# Patient Record
Sex: Female | Born: 1940
Health system: Southern US, Community
[De-identification: ages and names within clinical notes are randomized; demographics above are authoritative.]

## PROBLEM LIST (undated history)

## (undated) DIAGNOSIS — J45909 Unspecified asthma, uncomplicated: Secondary | ICD-10-CM

## (undated) DIAGNOSIS — G459 Transient cerebral ischemic attack, unspecified: Secondary | ICD-10-CM

## (undated) DIAGNOSIS — I10 Essential (primary) hypertension: Secondary | ICD-10-CM

## (undated) DIAGNOSIS — E785 Hyperlipidemia, unspecified: Secondary | ICD-10-CM

## (undated) DIAGNOSIS — M1990 Unspecified osteoarthritis, unspecified site: Secondary | ICD-10-CM

## (undated) DIAGNOSIS — R232 Flushing: Secondary | ICD-10-CM

## (undated) DIAGNOSIS — I639 Cerebral infarction, unspecified: Secondary | ICD-10-CM

## (undated) DIAGNOSIS — I69354 Hemiplegia and hemiparesis following cerebral infarction affecting left non-dominant side: Secondary | ICD-10-CM

## (undated) DIAGNOSIS — F418 Other specified anxiety disorders: Secondary | ICD-10-CM

## (undated) DIAGNOSIS — M6289 Other specified disorders of muscle: Secondary | ICD-10-CM

## (undated) DIAGNOSIS — K219 Gastro-esophageal reflux disease without esophagitis: Secondary | ICD-10-CM

## (undated) DIAGNOSIS — M24542 Contracture, left hand: Secondary | ICD-10-CM

## (undated) HISTORY — DX: Other specified anxiety disorders: F41.8

## (undated) HISTORY — PX: APPENDECTOMY: SHX54

## (undated) HISTORY — DX: Other specified disorders of muscle: M62.89

## (undated) HISTORY — PX: VAGINAL HYSTERECTOMY: SHX2639

## (undated) HISTORY — DX: Flushing: R23.2

## (undated) HISTORY — PX: EYE SURGERY: SHX253

---

## 2000-07-15 ENCOUNTER — Encounter: Payer: Self-pay | Admitting: Family Medicine

## 2000-07-15 ENCOUNTER — Encounter: Admission: RE | Admit: 2000-07-15 | Discharge: 2000-07-15 | Payer: Self-pay | Admitting: Family Medicine

## 2000-07-22 ENCOUNTER — Encounter: Admission: RE | Admit: 2000-07-22 | Discharge: 2000-08-27 | Payer: Self-pay | Admitting: Family Medicine

## 2004-09-17 ENCOUNTER — Ambulatory Visit: Payer: Self-pay | Admitting: Family Medicine

## 2004-11-19 ENCOUNTER — Encounter: Payer: Self-pay | Admitting: Family Medicine

## 2004-11-19 ENCOUNTER — Other Ambulatory Visit: Admission: RE | Admit: 2004-11-19 | Discharge: 2004-11-19 | Payer: Self-pay | Admitting: Family Medicine

## 2004-11-19 ENCOUNTER — Ambulatory Visit: Payer: Self-pay | Admitting: Family Medicine

## 2004-11-26 ENCOUNTER — Ambulatory Visit: Payer: Self-pay | Admitting: Internal Medicine

## 2004-11-28 ENCOUNTER — Ambulatory Visit: Payer: Self-pay | Admitting: Family Medicine

## 2005-05-13 ENCOUNTER — Emergency Department: Payer: Self-pay | Admitting: Emergency Medicine

## 2006-01-28 ENCOUNTER — Ambulatory Visit: Payer: Self-pay | Admitting: Family Medicine

## 2006-02-03 ENCOUNTER — Ambulatory Visit: Payer: Self-pay | Admitting: Family Medicine

## 2006-02-03 LAB — CONVERTED CEMR LAB
ALT: 186 units/L — ABNORMAL HIGH (ref 0–40)
Basophils Absolute: 0 10*3/uL (ref 0.0–0.1)
Calcium: 9.7 mg/dL (ref 8.4–10.5)
Creatinine, Ser: 0.7 mg/dL (ref 0.4–1.2)
Direct LDL: 170.7 mg/dL
Eosinophils Absolute: 0.1 10*3/uL (ref 0.0–0.6)
GFR calc Af Amer: 108 mL/min
HDL: 32.6 mg/dL — ABNORMAL LOW (ref >39.0)
Hemoglobin: 14 g/dL (ref 12.0–15.0)
MCHC: 34.2 g/dL (ref 30.0–36.0)
Monocytes Absolute: 0.4 10*3/uL (ref 0.2–0.7)
Monocytes Relative: 7.6 % (ref 3.0–11.0)
Neutrophils Relative %: 62.6 % (ref 43.0–77.0)
RBC: 4.55 M/uL (ref 3.87–5.11)
RDW: 12 % (ref 11.5–14.6)
TSH: 2.61 microintl units/mL
TSH: 2.61 microintl units/mL (ref 0.35–5.50)
Total CHOL/HDL Ratio: 7.1
Triglycerides: 173 mg/dL — ABNORMAL HIGH (ref 0–149)

## 2006-02-06 ENCOUNTER — Ambulatory Visit: Payer: Self-pay | Admitting: Family Medicine

## 2006-02-28 ENCOUNTER — Ambulatory Visit: Payer: Self-pay | Admitting: Family Medicine

## 2006-04-18 ENCOUNTER — Ambulatory Visit: Payer: Self-pay | Admitting: Family Medicine

## 2006-04-18 LAB — CONVERTED CEMR LAB
ALT: 26 units/L (ref 0–40)
AST: 30 units/L (ref 0–37)
Albumin: 3.9 g/dL (ref 3.5–5.2)
Alkaline Phosphatase: 64 units/L (ref 39–117)
Bilirubin, Direct: 0.1 mg/dL (ref 0.0–0.3)
Total Bilirubin: 0.7 mg/dL (ref 0.3–1.2)

## 2006-07-23 ENCOUNTER — Encounter: Payer: Self-pay | Admitting: Family Medicine

## 2006-07-23 DIAGNOSIS — E039 Hypothyroidism, unspecified: Secondary | ICD-10-CM | POA: Insufficient documentation

## 2006-07-23 DIAGNOSIS — T7840XA Allergy, unspecified, initial encounter: Secondary | ICD-10-CM | POA: Insufficient documentation

## 2006-07-23 DIAGNOSIS — M8580 Other specified disorders of bone density and structure, unspecified site: Secondary | ICD-10-CM

## 2006-07-23 DIAGNOSIS — E785 Hyperlipidemia, unspecified: Secondary | ICD-10-CM | POA: Insufficient documentation

## 2006-07-23 DIAGNOSIS — I10 Essential (primary) hypertension: Secondary | ICD-10-CM | POA: Insufficient documentation

## 2006-07-23 DIAGNOSIS — L719 Rosacea, unspecified: Secondary | ICD-10-CM | POA: Insufficient documentation

## 2006-07-29 ENCOUNTER — Ambulatory Visit: Payer: Self-pay | Admitting: Family Medicine

## 2006-07-31 LAB — CONVERTED CEMR LAB
Alkaline Phosphatase: 61 units/L (ref 39–117)
Cholesterol: 192 mg/dL (ref 0–200)
Direct LDL: 134 mg/dL
Total Bilirubin: 0.5 mg/dL (ref 0.3–1.2)

## 2006-12-03 ENCOUNTER — Ambulatory Visit: Payer: Self-pay | Admitting: Family Medicine

## 2006-12-08 ENCOUNTER — Ambulatory Visit: Payer: Self-pay | Admitting: Family Medicine

## 2007-02-10 ENCOUNTER — Encounter (INDEPENDENT_AMBULATORY_CARE_PROVIDER_SITE_OTHER): Payer: Self-pay | Admitting: Internal Medicine

## 2007-02-18 ENCOUNTER — Encounter (INDEPENDENT_AMBULATORY_CARE_PROVIDER_SITE_OTHER): Payer: Self-pay | Admitting: *Deleted

## 2007-03-02 ENCOUNTER — Ambulatory Visit: Payer: Self-pay | Admitting: Family Medicine

## 2007-03-06 ENCOUNTER — Encounter (INDEPENDENT_AMBULATORY_CARE_PROVIDER_SITE_OTHER): Payer: Self-pay | Admitting: *Deleted

## 2007-03-06 LAB — CONVERTED CEMR LAB
ALT: 22 units/L (ref 0–35)
Basophils Absolute: 0 10*3/uL (ref 0.0–0.1)
CO2: 34 meq/L — ABNORMAL HIGH (ref 19–32)
Cholesterol: 201 mg/dL (ref 0–200)
Eosinophils Absolute: 0.3 10*3/uL (ref 0.0–0.6)
Glucose, Bld: 92 mg/dL (ref 70–99)
Lymphocytes Relative: 26.7 % (ref 12.0–46.0)
MCV: 88.4 fL (ref 78.0–100.0)
Monocytes Relative: 4.6 % (ref 3.0–11.0)
Neutro Abs: 5.3 10*3/uL (ref 1.4–7.7)
Neutrophils Relative %: 64.3 % (ref 43.0–77.0)
Potassium: 3.3 meq/L — ABNORMAL LOW (ref 3.5–5.1)
RBC: 4.25 M/uL (ref 3.87–5.11)
Sodium: 142 meq/L (ref 135–145)
TSH: 4.11 microintl units/mL (ref 0.35–5.50)
Total Bilirubin: 0.5 mg/dL (ref 0.3–1.2)
Total CHOL/HDL Ratio: 5.2
Total Protein: 6.8 g/dL (ref 6.0–8.3)
Triglycerides: 251 mg/dL (ref 0–149)
VLDL: 50 mg/dL — ABNORMAL HIGH (ref 0–40)
Vit D, 1,25-Dihydroxy: 45 (ref 30–89)
WBC: 8.2 10*3/uL (ref 4.5–10.5)

## 2007-04-07 ENCOUNTER — Ambulatory Visit: Payer: Self-pay | Admitting: Family Medicine

## 2007-04-13 ENCOUNTER — Ambulatory Visit: Payer: Self-pay | Admitting: Internal Medicine

## 2007-04-13 ENCOUNTER — Encounter: Payer: Self-pay | Admitting: Family Medicine

## 2007-04-16 LAB — CONVERTED CEMR LAB: Potassium: 3.6 meq/L (ref 3.5–5.1)

## 2007-05-05 ENCOUNTER — Telehealth: Payer: Self-pay | Admitting: Family Medicine

## 2007-05-11 ENCOUNTER — Encounter (INDEPENDENT_AMBULATORY_CARE_PROVIDER_SITE_OTHER): Payer: Self-pay | Admitting: *Deleted

## 2007-07-09 ENCOUNTER — Telehealth (INDEPENDENT_AMBULATORY_CARE_PROVIDER_SITE_OTHER): Payer: Self-pay | Admitting: *Deleted

## 2007-09-17 ENCOUNTER — Ambulatory Visit: Payer: Self-pay | Admitting: Family Medicine

## 2007-09-21 ENCOUNTER — Ambulatory Visit: Payer: Self-pay | Admitting: Family Medicine

## 2007-09-23 ENCOUNTER — Telehealth (INDEPENDENT_AMBULATORY_CARE_PROVIDER_SITE_OTHER): Payer: Self-pay | Admitting: *Deleted

## 2007-09-23 ENCOUNTER — Encounter: Payer: Self-pay | Admitting: Family Medicine

## 2007-09-28 ENCOUNTER — Telehealth: Payer: Self-pay | Admitting: Family Medicine

## 2007-09-29 ENCOUNTER — Encounter: Payer: Self-pay | Admitting: Family Medicine

## 2007-09-29 ENCOUNTER — Ambulatory Visit: Payer: Self-pay | Admitting: Family Medicine

## 2007-10-02 ENCOUNTER — Ambulatory Visit: Payer: Self-pay | Admitting: Family Medicine

## 2007-10-13 ENCOUNTER — Ambulatory Visit: Payer: Self-pay | Admitting: Family Medicine

## 2007-10-13 DIAGNOSIS — K219 Gastro-esophageal reflux disease without esophagitis: Secondary | ICD-10-CM

## 2007-10-22 ENCOUNTER — Ambulatory Visit: Payer: Self-pay | Admitting: Pulmonary Disease

## 2007-10-22 DIAGNOSIS — R930 Abnormal findings on diagnostic imaging of skull and head, not elsewhere classified: Secondary | ICD-10-CM

## 2008-01-05 ENCOUNTER — Encounter: Payer: Self-pay | Admitting: Family Medicine

## 2008-01-05 ENCOUNTER — Other Ambulatory Visit: Admission: RE | Admit: 2008-01-05 | Discharge: 2008-01-05 | Payer: Self-pay | Admitting: Family Medicine

## 2008-01-05 ENCOUNTER — Ambulatory Visit: Payer: Self-pay | Admitting: Family Medicine

## 2008-01-05 LAB — CONVERTED CEMR LAB: Whiff Test: NEGATIVE

## 2008-02-01 ENCOUNTER — Telehealth: Payer: Self-pay | Admitting: Family Medicine

## 2008-05-09 ENCOUNTER — Encounter: Payer: Self-pay | Admitting: Family Medicine

## 2008-05-17 ENCOUNTER — Encounter: Payer: Self-pay | Admitting: Family Medicine

## 2008-05-21 ENCOUNTER — Encounter (INDEPENDENT_AMBULATORY_CARE_PROVIDER_SITE_OTHER): Payer: Self-pay | Admitting: *Deleted

## 2008-06-17 ENCOUNTER — Encounter: Payer: Self-pay | Admitting: Family Medicine

## 2008-06-17 HISTORY — PX: COLONOSCOPY: SHX174

## 2008-11-08 ENCOUNTER — Ambulatory Visit: Payer: Self-pay | Admitting: Family Medicine

## 2008-11-22 ENCOUNTER — Ambulatory Visit: Payer: Self-pay | Admitting: Family Medicine

## 2008-12-14 ENCOUNTER — Ambulatory Visit: Payer: Self-pay | Admitting: Family Medicine

## 2008-12-14 ENCOUNTER — Other Ambulatory Visit: Admission: RE | Admit: 2008-12-14 | Discharge: 2008-12-14 | Payer: Self-pay | Admitting: Family Medicine

## 2008-12-15 LAB — CONVERTED CEMR LAB
Alkaline Phosphatase: 67 units/L (ref 39–117)
Basophils Absolute: 0.1 10*3/uL (ref 0.0–0.1)
Bilirubin, Direct: 0 mg/dL (ref 0.0–0.3)
CO2: 32 meq/L (ref 19–32)
Chloride: 103 meq/L (ref 96–112)
Cholesterol: 230 mg/dL — ABNORMAL HIGH (ref 0–200)
Direct LDL: 161.4 mg/dL
Eosinophils Absolute: 0.2 10*3/uL (ref 0.0–0.7)
Eosinophils Relative: 2.5 % (ref 0.0–5.0)
HCT: 39.1 % (ref 36.0–46.0)
HDL: 42.7 mg/dL (ref >39.00)
Lymphs Abs: 2.8 10*3/uL (ref 0.7–4.0)
MCHC: 33.3 g/dL (ref 30.0–36.0)
MCV: 89 fL (ref 78.0–100.0)
Monocytes Relative: 7.5 % (ref 3.0–12.0)
Neutrophils Relative %: 54.6 % (ref 43.0–77.0)
Platelets: 261 10*3/uL (ref 150.0–400.0)
Total Bilirubin: 0.6 mg/dL (ref 0.3–1.2)
Total CHOL/HDL Ratio: 5
Total Protein: 7 g/dL (ref 6.0–8.3)
Triglycerides: 173 mg/dL — ABNORMAL HIGH (ref 0.0–149.0)
VLDL: 34.6 mg/dL (ref 0.0–40.0)
Vitamin B-12: 995 pg/mL — ABNORMAL HIGH (ref 211–911)

## 2008-12-23 ENCOUNTER — Encounter (INDEPENDENT_AMBULATORY_CARE_PROVIDER_SITE_OTHER): Payer: Self-pay | Admitting: *Deleted

## 2008-12-23 LAB — CONVERTED CEMR LAB: Pap Smear: NORMAL

## 2008-12-28 ENCOUNTER — Inpatient Hospital Stay (HOSPITAL_COMMUNITY): Admission: EM | Admit: 2008-12-28 | Discharge: 2009-01-02 | Payer: Self-pay | Admitting: Emergency Medicine

## 2008-12-29 ENCOUNTER — Encounter (INDEPENDENT_AMBULATORY_CARE_PROVIDER_SITE_OTHER): Payer: Self-pay | Admitting: Neurology

## 2008-12-29 ENCOUNTER — Ambulatory Visit: Payer: Self-pay | Admitting: Vascular Surgery

## 2008-12-29 ENCOUNTER — Ambulatory Visit: Payer: Self-pay | Admitting: Physical Medicine & Rehabilitation

## 2009-01-02 ENCOUNTER — Inpatient Hospital Stay (HOSPITAL_COMMUNITY)
Admission: EM | Admit: 2009-01-02 | Discharge: 2009-01-23 | Payer: Self-pay | Admitting: Physical Medicine & Rehabilitation

## 2009-01-07 ENCOUNTER — Ambulatory Visit: Payer: Self-pay | Admitting: Physical Medicine & Rehabilitation

## 2009-01-09 ENCOUNTER — Ambulatory Visit: Payer: Self-pay | Admitting: Physical Medicine & Rehabilitation

## 2009-02-15 ENCOUNTER — Encounter
Admission: RE | Admit: 2009-02-15 | Discharge: 2009-05-16 | Payer: Self-pay | Admitting: Physical Medicine & Rehabilitation

## 2009-02-21 ENCOUNTER — Ambulatory Visit: Payer: Self-pay | Admitting: Physical Medicine & Rehabilitation

## 2009-03-21 ENCOUNTER — Ambulatory Visit: Payer: Self-pay | Admitting: Physical Medicine & Rehabilitation

## 2009-04-04 ENCOUNTER — Ambulatory Visit: Payer: Self-pay | Admitting: Family Medicine

## 2009-04-04 DIAGNOSIS — G8190 Hemiplegia, unspecified affecting unspecified side: Secondary | ICD-10-CM

## 2009-04-04 DIAGNOSIS — R7309 Other abnormal glucose: Secondary | ICD-10-CM

## 2009-04-04 DIAGNOSIS — I6350 Cerebral infarction due to unspecified occlusion or stenosis of unspecified cerebral artery: Secondary | ICD-10-CM | POA: Insufficient documentation

## 2009-04-05 ENCOUNTER — Encounter: Payer: Self-pay | Admitting: Family Medicine

## 2009-04-05 LAB — CONVERTED CEMR LAB
Albumin: 4.2 g/dL (ref 3.5–5.2)
BUN: 11 mg/dL (ref 6–23)
Calcium: 10.1 mg/dL (ref 8.4–10.5)
GFR calc non Af Amer: 88.09 mL/min (ref >60)
HDL: 46 mg/dL (ref >39.00)
Hgb A1c MFr Bld: 5.4 % (ref 4.6–6.5)
Phosphorus: 4.2 mg/dL (ref 2.3–4.6)
Potassium: 4.9 meq/L (ref 3.5–5.1)
Sodium: 142 meq/L (ref 135–145)
Total CHOL/HDL Ratio: 3

## 2009-04-10 ENCOUNTER — Telehealth: Payer: Self-pay | Admitting: Family Medicine

## 2009-04-10 DIAGNOSIS — M25519 Pain in unspecified shoulder: Secondary | ICD-10-CM | POA: Insufficient documentation

## 2009-04-13 ENCOUNTER — Ambulatory Visit: Payer: Self-pay | Admitting: Internal Medicine

## 2009-04-13 DIAGNOSIS — M1990 Unspecified osteoarthritis, unspecified site: Secondary | ICD-10-CM | POA: Insufficient documentation

## 2009-04-14 ENCOUNTER — Telehealth: Payer: Self-pay | Admitting: Family Medicine

## 2009-04-14 ENCOUNTER — Telehealth: Payer: Self-pay | Admitting: Internal Medicine

## 2009-04-18 ENCOUNTER — Telehealth: Payer: Self-pay | Admitting: Internal Medicine

## 2009-04-20 ENCOUNTER — Encounter: Payer: Self-pay | Admitting: Internal Medicine

## 2009-04-20 ENCOUNTER — Telehealth: Payer: Self-pay | Admitting: Internal Medicine

## 2009-04-20 ENCOUNTER — Ambulatory Visit: Payer: Self-pay | Admitting: Internal Medicine

## 2009-04-20 DIAGNOSIS — M7580 Other shoulder lesions, unspecified shoulder: Secondary | ICD-10-CM

## 2009-04-25 ENCOUNTER — Telehealth: Payer: Self-pay | Admitting: Internal Medicine

## 2009-04-27 ENCOUNTER — Telehealth: Payer: Self-pay | Admitting: Internal Medicine

## 2009-04-27 ENCOUNTER — Ambulatory Visit: Payer: Self-pay | Admitting: Physical Medicine & Rehabilitation

## 2009-05-02 ENCOUNTER — Inpatient Hospital Stay (HOSPITAL_COMMUNITY): Admission: EM | Admit: 2009-05-02 | Discharge: 2009-05-09 | Payer: Self-pay | Admitting: Emergency Medicine

## 2009-05-02 ENCOUNTER — Ambulatory Visit: Payer: Self-pay | Admitting: Cardiovascular Disease

## 2009-05-02 ENCOUNTER — Encounter: Payer: Self-pay | Admitting: Internal Medicine

## 2009-05-02 ENCOUNTER — Telehealth: Payer: Self-pay | Admitting: Internal Medicine

## 2009-05-03 ENCOUNTER — Encounter: Payer: Self-pay | Admitting: Internal Medicine

## 2009-05-05 ENCOUNTER — Ambulatory Visit: Payer: Self-pay | Admitting: Physical Medicine & Rehabilitation

## 2009-05-07 ENCOUNTER — Encounter (INDEPENDENT_AMBULATORY_CARE_PROVIDER_SITE_OTHER): Payer: Self-pay | Admitting: Internal Medicine

## 2009-05-12 ENCOUNTER — Encounter: Payer: Self-pay | Admitting: Internal Medicine

## 2009-06-20 ENCOUNTER — Encounter: Payer: Self-pay | Admitting: Internal Medicine

## 2009-06-21 ENCOUNTER — Encounter: Payer: Self-pay | Admitting: Internal Medicine

## 2009-06-23 ENCOUNTER — Ambulatory Visit: Payer: Self-pay | Admitting: Internal Medicine

## 2009-06-23 DIAGNOSIS — E876 Hypokalemia: Secondary | ICD-10-CM | POA: Insufficient documentation

## 2009-06-23 LAB — CONVERTED CEMR LAB
BUN: 8 mg/dL (ref 6–23)
Creatinine, Ser: 0.6 mg/dL (ref 0.4–1.2)
GFR calc non Af Amer: 116.28 mL/min (ref >60)
Glucose, Bld: 92 mg/dL (ref 70–99)
Hgb A1c MFr Bld: 5.6 % (ref 4.6–6.5)
Magnesium: 1.9 mg/dL (ref 1.5–2.5)
Potassium: 3.6 meq/L (ref 3.5–5.1)
Sodium: 137 meq/L (ref 135–145)

## 2009-06-26 ENCOUNTER — Encounter: Payer: Self-pay | Admitting: Internal Medicine

## 2009-06-26 ENCOUNTER — Telehealth: Payer: Self-pay | Admitting: Internal Medicine

## 2009-06-27 ENCOUNTER — Encounter: Payer: Self-pay | Admitting: Internal Medicine

## 2009-07-01 ENCOUNTER — Emergency Department: Payer: Self-pay | Admitting: Internal Medicine

## 2009-07-05 ENCOUNTER — Encounter: Payer: Self-pay | Admitting: Internal Medicine

## 2009-07-07 ENCOUNTER — Encounter: Payer: Self-pay | Admitting: Internal Medicine

## 2009-07-11 ENCOUNTER — Encounter: Payer: Self-pay | Admitting: Internal Medicine

## 2009-07-23 ENCOUNTER — Encounter: Payer: Self-pay | Admitting: Internal Medicine

## 2009-07-28 ENCOUNTER — Encounter: Payer: Self-pay | Admitting: Internal Medicine

## 2009-07-31 ENCOUNTER — Telehealth: Payer: Self-pay | Admitting: Internal Medicine

## 2009-07-31 ENCOUNTER — Encounter: Payer: Self-pay | Admitting: Internal Medicine

## 2009-08-01 ENCOUNTER — Telehealth: Payer: Self-pay | Admitting: Internal Medicine

## 2009-08-01 DIAGNOSIS — J370 Chronic laryngitis: Secondary | ICD-10-CM

## 2009-08-02 ENCOUNTER — Encounter: Payer: Self-pay | Admitting: Internal Medicine

## 2009-08-05 ENCOUNTER — Encounter: Payer: Self-pay | Admitting: Internal Medicine

## 2009-08-07 ENCOUNTER — Encounter: Payer: Self-pay | Admitting: Internal Medicine

## 2009-08-08 ENCOUNTER — Encounter: Payer: Self-pay | Admitting: Internal Medicine

## 2009-08-10 ENCOUNTER — Telehealth: Payer: Self-pay | Admitting: Internal Medicine

## 2009-08-22 ENCOUNTER — Encounter: Payer: Self-pay | Admitting: Internal Medicine

## 2009-08-23 ENCOUNTER — Telehealth (INDEPENDENT_AMBULATORY_CARE_PROVIDER_SITE_OTHER): Payer: Self-pay | Admitting: *Deleted

## 2009-09-07 ENCOUNTER — Encounter: Payer: Self-pay | Admitting: Internal Medicine

## 2009-10-07 ENCOUNTER — Encounter: Payer: Self-pay | Admitting: Internal Medicine

## 2009-11-07 ENCOUNTER — Encounter: Payer: Self-pay | Admitting: Internal Medicine

## 2009-12-07 ENCOUNTER — Encounter: Payer: Self-pay | Admitting: Internal Medicine

## 2010-01-04 ENCOUNTER — Encounter: Payer: Self-pay | Admitting: Internal Medicine

## 2010-01-07 ENCOUNTER — Encounter: Payer: Self-pay | Admitting: Internal Medicine

## 2010-01-28 ENCOUNTER — Encounter: Payer: Self-pay | Admitting: Internal Medicine

## 2010-01-30 ENCOUNTER — Telehealth: Payer: Self-pay | Admitting: Family Medicine

## 2010-02-06 NOTE — Progress Notes (Signed)
Summary: CONTINUED PAIN   Phone Note Call from Patient   Summary of Call: Patient is requesting results from yesterday.  Initial call taken by: Lamar Sprinkles, CMA,  April 14, 2009 4:37 PM  Follow-up for Phone Call        xrays were normal Follow-up by: Etta Grandchild MD,  April 16, 2009 12:28 PM  Additional Follow-up for Phone Call Additional follow up Details #1::        Pt informed, she continues to be extremely sore especially in her chest, mainly at night. Sleeping sitting up in recliner is the only way she is able to get any relief. She is taking tramadol qid as prescribed.    *Pt is transferring to Dr Yetta Barre as PCP Additional Follow-up by: Lamar Sprinkles, CMA,  April 17, 2009 11:22 AM    Additional Follow-up for Phone Call Additional follow up Details #2::    is she taking 2 tramadol? Follow-up by: Etta Grandchild MD,  April 17, 2009 11:27 AM  Additional Follow-up for Phone Call Additional follow up Details #3:: Details for Additional Follow-up Action Taken: She is taking 1 four times a day.....................Marland KitchenLamar Sprinkles, CMA  April 17, 2009 11:52 AM   please increase to 2 qid   Pt informed.........................Marland KitchenLamar Sprinkles, CMA  April 17, 2009 2:03 PM  Additional Follow-up by: Etta Grandchild MD,  April 17, 2009 12:01 PM

## 2010-02-06 NOTE — Progress Notes (Signed)
  Phone Note Call from Patient   Caller: Patient Summary of Call: Called Patients home, spoke to her husband to give them the Orthopedic appt we had made. Husband told me to cancel this appt that they  did not want it anymore and that they would be seeing Dr Santa Genera from now on they would not be coming back to [facility]. Initial call taken by: Carlton Adam,  April 14, 2009 10:32 AM

## 2010-02-06 NOTE — Progress Notes (Signed)
Summary: REFILLS - New patient  Phone Note Refill Request   Refills Requested: Medication #1:  ZOCOR 40 MG TABS take one at bedtime  Medication #2:  ZANAFLEX 4 MG TABS take one four times a day  Medication #3:  HYDROCHLOROTHIAZIDE 25 MG TABS Take 1 tablet by mouth once a day  Medication #4:  ALTACE 5 MG CAPS Take 1 capsule by mouth once a day ALSO NEEDS XANAX  Initial call taken by: Lamar Sprinkles, CMA,  April 18, 2009 1:12 PM    Prescriptions: Prudy Feeler 0.5 MG TABS (ALPRAZOLAM) take one by mouth at bedtime as needed anxiety  #30 x 5   Entered and Authorized by:   Etta Grandchild MD   Signed by:   Etta Grandchild MD on 04/19/2009   Method used:   Print then Give to Patient   RxID:   [id number] ZOCOR 40 MG TABS (SIMVASTATIN) take one at bedtime  #30 x 11   Entered and Authorized by:   Etta Grandchild MD   Signed by:   Etta Grandchild MD on 04/19/2009   Method used:   Print then Give to Patient   RxID:   [id number] ALTACE 5 MG CAPS (RAMIPRIL) Take 1 capsule by mouth once a day  #30 x 11   Entered and Authorized by:   Etta Grandchild MD   Signed by:   Etta Grandchild MD on 04/19/2009   Method used:   Print then Give to Patient   RxID:   [id number] HYDROCHLOROTHIAZIDE 25 MG TABS (HYDROCHLOROTHIAZIDE) Take 1 tablet by mouth once a day  #30 x 11   Entered and Authorized by:   Etta Grandchild MD   Signed by:   Etta Grandchild MD on 04/19/2009   Method used:   Print then Give to Patient   RxID:   [id number] ZANAFLEX 4 MG TABS (TIZANIDINE HCL) take one four times a day  #120 x 11   Entered and Authorized by:   Etta Grandchild MD   Signed by:   Etta Grandchild MD on 04/19/2009   Method used:   Print then Give to Patient   RxID:   [id number]  Rx faxed to [facility]/ [address] at [phone]/la

## 2010-02-06 NOTE — Medication Information (Signed)
Summary: PAP/[facility]  PAP/[facility]   Imported By: Lester [place] 08/07/2009 07:59:01  _____________________________________________________________________  External Attachment:    Type:   Image     Comment:   External Document

## 2010-02-06 NOTE — Progress Notes (Signed)
Summary: ZOCOR  Phone Note Call from Patient   Summary of Call: Patient is requesting permission from Dr Yetta Barre to stop zocor. Per pt, Dr Milinda Antis told her that cholesterol was excellent.  Initial call taken by: Lamar Sprinkles, CMA,  April 27, 2009 3:42 PM  Follow-up for Phone Call        ok Follow-up by: Etta Grandchild MD,  April 27, 2009 3:44 PM  Additional Follow-up for Phone Call Additional follow up Details #1::        pt informed via home VM, told to call back with any further question or concerns Additional Follow-up by: Margaret Pyle, CMA,  April 27, 2009 4:38 PM

## 2010-02-06 NOTE — Letter (Signed)
Summary: Results Follow-up Letter  [facility]  [address] [place], [place] [address]   Phone: [phone]  Fax: [phone]    04/20/2009  [address] [place], [place]  [address]  Dear Ms. [name],   The following are the results of your recent test(s):  Test     Result     Shoulder     bones look good but there could be some impingement so        I would like to do an MRI   _________________________________________________________  Please call for an appointment as drirected_________________________________________________________ _________________________________________________________ _________________________________________________________  Sincerely,  Sanda Linger MD [facility]

## 2010-02-06 NOTE — Progress Notes (Signed)
Summary: REFILL  Phone Note Refill Request Message from:  Pharmacy  Refills Requested: Medication #1:  NEURONTIN 300 MG CAPS take one three times a day   Supply Requested: 1 year Initial call taken by: Lamar Sprinkles, CMA,  April 27, 2009 12:22 PM  Follow-up for Phone Call        ok Follow-up by: Etta Grandchild MD,  April 27, 2009 12:33 PM    Prescriptions: NEURONTIN 300 MG CAPS (GABAPENTIN) take one three times a day  #90 x 11   Entered and Authorized by:   Etta Grandchild MD   Signed by:   Etta Grandchild MD on 04/27/2009   Method used:   Electronically to        [facility] [phone]* (retail)       [address] [place], [place]  [address]       Ph: [phone]       Fax: [phone]   RxID:   [id number]

## 2010-02-06 NOTE — Consult Note (Signed)
Summary: [facility]  [facility]   Imported By: Sherian Rein 08/15/2009 13:32:26  _____________________________________________________________________  External Attachment:    Type:   Image     Comment:   External Document

## 2010-02-06 NOTE — Miscellaneous (Signed)
Summary: Plan of Treatment/[facility]  Plan of Treatment/[facility]   Imported By: Sherian Rein 07/18/2009 14:47:51  _____________________________________________________________________  External Attachment:    Type:   Image     Comment:   External Document

## 2010-02-06 NOTE — Miscellaneous (Signed)
Summary: Order/[facility]  Order/[facility]   Imported By: Lester [place] 05/08/2009 09:37:09  _____________________________________________________________________  External Attachment:    Type:   Image     Comment:   External Document

## 2010-02-06 NOTE — Miscellaneous (Signed)
Summary: Order/[facility]  Order/[facility]   Imported By: Lester [place] 06/22/2009 09:44:46  _____________________________________________________________________  External Attachment:    Type:   Image     Comment:   External Document

## 2010-02-06 NOTE — Letter (Signed)
Summary: Handicapped Placard/[facility]  Handicapped Plac[facility]   Imported By: Sherian Rein 05/05/2009 09:03:35  _____________________________________________________________________  External Attachment:    Type:   Image     Comment:   External Document

## 2010-02-06 NOTE — Progress Notes (Signed)
  Phone Note Other Incoming   Request: Send information Summary of Call: Request for records received from [facility]. Request forwarded to [facility].     

## 2010-02-06 NOTE — Progress Notes (Signed)
Summary: MRI  Phone Note Call from Patient   Summary of Call: Pt was unable to have the MRI yesterday b/c of severe pain and she was unable to lay still long enough. Radiology suggested pt req to have apt set up at the hospital for MRI w/sedation. If ok, please put in new referral.  Initial call taken by: Lamar Sprinkles, CMA,  April 25, 2009 8:50 AM  Follow-up for Phone Call        will refer to ortho instead, thanks Follow-up by: Etta Grandchild MD,  April 25, 2009 9:00 AM  Additional Follow-up for Phone Call Additional follow up Details #1::        Pt informed  Additional Follow-up by: Lamar Sprinkles, CMA,  April 25, 2009 10:47 AM

## 2010-02-06 NOTE — Miscellaneous (Signed)
Summary: [facility]  [facility]   Imported By: Lanelle Bal 04/11/2009 11:38:08  _____________________________________________________________________  External Attachment:    Type:   Image     Comment:   External Document

## 2010-02-06 NOTE — Miscellaneous (Signed)
Summary: [facility]  [facility]   Imported By: Lester [place] 08/24/2009 10:02:14  _____________________________________________________________________  External Attachment:    Type:   Image     Comment:   External Document

## 2010-02-06 NOTE — Miscellaneous (Signed)
Summary: Order/[facility]  Order/[facility]   Imported By: Lester [place] 05/08/2009 09:32:10  _____________________________________________________________________  External Attachment:    Type:   Image     Comment:   External Document

## 2010-02-06 NOTE — Assessment & Plan Note (Signed)
Summary: F/U [facility]  D/C 01/23/09,[facility]   Vital Signs:  Patient profile:   70 year old female Height:      60.75 inches Weight:      172.50 pounds BMI:     32.98 Temp:     98.8 degrees F oral Pulse rate:   76 / minute Pulse rhythm:   regular BP sitting:   110 / 64  (left arm) Cuff size:   regular  Vitals Entered By: Lewanda Rife LPN (April 04, 2009 12:08 PM) CC: f/u [facility] 01/23/09 and [facility]   History of Present Illness: here for f/u of hosp and long rehab stay after stroke   pt had lacunar cva resulting in L spastic hemiparesis  some question of R vert art stenosis and R ICa narrowing (wihtout stenosis)- carotid dopplers were clear  on asa was put on "point study" drug did well in rehab-- uses AFO brace on L ankle and also a cane  can walk on her own - for a while now   she completed the point study drug off asa  on plavix now -doing fine with  will be going into another study inv Dm-- Dr Pearlean Brownie   left arm is still in contracture -- they tried some botox injections and this helped - but lost some muscle fxn  can reach and pick up off the floor doing passive rom exercises   originally high sugar with AIC 6.2- low carb diet lost 10 lb  eating less salt now   on zanaflex for spasticity  is at home now - has PT and OT home - and need to sign some forms today  is using [facility]   i  Allergies: 1)  Tetracycline 2)  Ace Inhibitors  Past History:  Past Surgical History: Last updated: 01/03/2009 Appendectomy GYN surgery- D & C Hysterectomy- partial, abn. pap (1971) Sinus surgery- polyp removed Tonsillectomy Implants in jaw/ bone grafts Foot surgery Dexa- osteopenia (02/2001) Dexa- stable/ mixed trend (11/2004) Colonoscopy- polyp (01/2005) Fallopian tube removed after ectopic pregnancy colonoscopy adenom colon polyp -- re check ? 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 rehab   Review of Systems General:  Denies fatigue, fever, loss of appetite, and malaise. Eyes:  Denies blurring and eye pain. CV:  Denies chest pain or discomfort, lightheadness, palpitations, and shortness of breath with exertion. Resp:  Denies cough and wheezing. GI:  Denies abdominal pain, bloody stools, change in bowel habits, and indigestion. GU:  Denies dysuria. MS:  Complains of joint pain, cramps, muscle weakness, and stiffness. Derm:  Denies lesion(s), poor wound healing, and rash. Neuro:  Denies numbness and tingling. Psych:  Denies anxiety and depression; has kept a great attitude through all of this . Endo:  Denies cold intolerance, excessive thirst,  excessive urination, and heat intolerance. Heme:  Denies abnormal bruising and bleeding.   Impression & Recommendations:  Problem # 1:  LACUNAR INFARCTION (ICD-434.91) Assessment New  rev hosp records/ labs in detail today now on plavix  in line for another study f/u Dr Pearlean Brownie lab today f/u with me in 2 mo  Her updated medication list for this problem includes:    Plavix 75 Mg Tabs (Clopidogrel bisulfate) .Marland Kitchen... 1 by mouth once daily  Orders: Prescription Created Electronically [phone])  Problem # 2:  HEMIPARESIS, LEFT  (ICD-342.90) Assessment: New  much imp with PT and OT and (AFO/ cane) , botox inj will continue tx at home goal to return to work face/ speech- is back almost to nl  Orders: Prescription Created Electronically [phone])  Problem # 3:  HYPERGLYCEMIA (ICD-790.29) Assessment: New better diet and wt loss AIC today will be inolved in study regarding this soon disc healthy diet (low simple sugar/ choose complex carbs/ low sat fat) diet and exercise in detail  Orders: TLB-A1C / Hgb A1C (Glycohemoglobin) (83036-A1C) Prescription Created Electronically [phone])  Problem # 4:  HYPOTHYROIDISM (ICD-244.9) Assessment: Unchanged  clinically stable/lab today Orders: Venipuncture (48546) TLB-Lipid Panel (80061-LIPID) TLB-Renal Function Panel (80069-RENAL) TLB-TSH (Thyroid Stimulating Hormone) (27035-KKX) Prescription Created Electronically [phone])  Labs Reviewed: TSH: 4.50 (12/14/2008)    Chol: 230 (12/14/2008)   HDL: 42.70 (12/14/2008)   LDL: DEL (03/02/2007)   TG: 173.0 (12/14/2008)  Problem # 5:  HYPERTENSION (ICD-401.9) Assessment: Improved  very good control - continue to monitor lab today The following medications were removed from the medication list:    Cardizem Cd 180 Mg Cp24 (Diltiazem hcl coated beads) .Marland Kitchen... Take one by mouth daily    Hydrochlorothiazide 50 Mg Tabs (Hydrochlorothiazide) .Marland Kitchen... 1 by mouth once daily Her updated medication list for this problem includes:    Clonidine Hcl 0.1 Mg Tabs (Clonidine hcl) .Marland Kitchen... Take one by mouth daily    Cardizem Cd 240 Mg Xr24h-cap (Diltiazem hcl coated beads) .Marland Kitchen... Take one by mouth daily    Hydrochlorothiazide 25 Mg Tabs (Hydrochlorothiazide) .Marland Kitchen... Take 1 tablet by mouth once a day    Altace 5 Mg Caps (Ramipril) .Marland Kitchen... Take 1 capsule by mouth once a day    Norvasc 5 Mg Tabs (Amlodipine besylate) .Marland Kitchen... Take one daily at 10:00pm  Orders: Venipuncture (99371) TLB-Lipid Panel (80061-LIPID) TLB-Renal Function Panel  (80069-RENAL) Prescription Created Electronically [phone])  BP today: 110/64 Prior BP: 162/92 (12/14/2008)  Labs Reviewed: K+: 4.4 (12/14/2008) Creat: : 0.7 (12/14/2008)   Chol: 230 (12/14/2008)   HDL: 42.70 (12/14/2008)   LDL: DEL (03/02/2007)   TG: 173.0 (12/14/2008)  Problem # 6:  HYPERLIPIDEMIA (ICD-272.4) Assessment: Improved  continue statin and good diet lab today Her updated medication list for this problem includes:    Zocor 40 Mg Tabs (Simvastatin) .Marland Kitchen... Take one at bedtime  Orders: Venipuncture (93810) TLB-Lipid Panel (80061-LIPID) TLB-Renal Function Panel (80069-RENAL) TLB-ALT (SGPT) (84460-ALT) TLB-AST (SGOT) (84450-SGOT) Prescription Created Electronically [phone])  Labs Reviewed: SGOT: 31 (12/14/2008)   SGPT: 36 (12/14/2008)   HDL:42.70 (12/14/2008), 38.8 (03/02/2007)  LDL:DEL (03/02/2007), DEL (07/29/2006)  Chol:230 (12/14/2008), 201 (03/02/2007)  Trig:173.0 (12/14/2008), 251 (03/02/2007)  Complete Medication List: 1)  Clonidine Hcl 0.1 Mg Tabs (Clonidine hcl) .... Take one by mouth daily 2)  Evista 60 Mg Tabs (Raloxifene hcl) .... Take one by mouth daily 3)  Centrum Tabs (Multiple vitamins-minerals) .... Take one by mouth daily 4)  Vitamin D 1000 Unit Tabs (Cholecalciferol) .... Take one by mouth daily 5)  Sublingual B12 (over The Counter)  .Marland Kitchen.. 1 by mouth qd 6)  Probiotic- For Colon Over The Counter  7)  Potassium Chloride Cr 10 Meq Tbcr (Potassium chloride) .... Take one by mouth daily 8)  Astelin 137 Mcg/spray Soln (Azelastine hcl) .... 2 sprays per nostril once daily as needed 9)  Fish Oil 500 Mg Caps (Omega-3 fatty acids) .... Take two by mouth daily 10)  Cardizem Cd 240 Mg Xr24h-cap (Diltiazem hcl coated beads) .... Take one by mouth daily 11)  Calcium 500 Mg Tabs (Calcium) .... Take two by mouth twice a day 12)  Zanaflex 4 Mg Tabs (Tizanidine hcl) .... Take one four times a day 13)  Claritin 10 Mg Tabs (Loratadine) .... Otc as directed. 14)  Protonix  40 Mg Tbec (Pantoprazole sodium) .... Take 1 tablet by mouth once a day 15)  Hydrochlorothiazide 25 Mg Tabs (Hydrochlorothiazide) .... Take 1 tablet by mouth once a day 16)  Altace 5 Mg Caps (Ramipril) .... Take 1 capsule by mouth once a day 17)  Zocor 40 Mg Tabs (Simvastatin) .... Take one at bedtime 18)  Neurontin 300 Mg Caps (Gabapentin) .... Take one three times a day 19)  Tylenol Extra Strength 500 Mg Tabs (Acetaminophen) .... Otc as directed. 20)  Xanax 0.5 Mg Tabs (Alprazolam) .... Take one by mouth at bedtime as needed anxiety 21)  Celexa 10 Mg Tabs (Citalopram hydrobromide) .... Take one tablet in am 22)  Norvasc 5 Mg Tabs (Amlodipine besylate) .... Take one daily at 10:00pm 23)  Plavix 75 Mg Tabs (Clopidogrel bisulfate) .Marland Kitchen.. 1 by mouth once daily  Patient Instructions: 1)  keep up great effort with diet and rehabilitation  2)  drink enough water  3)  labs today  4)  follow up with me in about 2 months  Prescriptions: CELEXA 10 MG TABS (CITALOPRAM HYDROBROMIDE) take one tablet in AM  #30 x 11   Entered and Authorized by:   Judith Part MD   Signed by:   Judith Part MD on 04/04/2009   Method used:   Electronically to        [facility] [phone]* (retail)       [address] [place], [place]  [address]       Ph: [phone]       Fax: [phone]   RxID:   [id number]   Current Allergies (reviewed today): TETRACYCLINE ACE INHIBITORS

## 2010-02-06 NOTE — Miscellaneous (Signed)
Summary: Orders/[facility]  Orders/[facility]   Imported By: Sherian Rein 06/28/2009 15:06:59  _____________________________________________________________________  External Attachment:    Type:   Image     Comment:   External Document

## 2010-02-06 NOTE — Miscellaneous (Signed)
Summary: Treatment Plan/[facility]  Treatment Plan/[facility]   Imported By: Sherian Rein 07/31/2009 14:48:05  _____________________________________________________________________  External Attachment:    Type:   Image     Comment:   External Document

## 2010-02-06 NOTE — Progress Notes (Signed)
Summary: REFERRAL E.N.T  Phone Note Call from Patient Call back at Home Phone [phone]   Caller: Patient Reason for Call: Talk to Doctor Summary of Call: REQUEST REFERRAL TO [facility] Initial call taken by: Migdalia Dk,  August 01, 2009 2:52 PM  Follow-up for Phone Call        done Follow-up by: Etta Grandchild MD,  August 02, 2009 7:18 AM  Additional Follow-up for Phone Call Additional follow up Details #1::        pcc to notify.Marland KitchenMarland KitchenAlvy Beal Archie CMA  August 02, 2009 11:20 AM   New Problems: LARYNGITIS, CHRONIC (ICD-476.0)   New Problems: LARYNGITIS, CHRONIC (ICD-476.0)

## 2010-02-06 NOTE — Progress Notes (Signed)
Summary: TO ER  Phone Note Other Incoming   Caller: pt Summary of Call: Pt called and stated since yesterday she had been having arthritic pain in her fingers. She also states that she is having trouble with speech and states that this started last night. what do you advise? Do you want pt to go to the ER? Initial call taken by: Ami Bullins CMA,  May 02, 2009 8:26 AM  Follow-up for Phone Call        yes, the speech issue sounds like another stroke Follow-up by: Etta Grandchild MD,  May 02, 2009 8:27 AM  Additional Follow-up for Phone Call Additional follow up Details #1::        Spoke w/pt, she has slurred speech and c/o elevated bp 182/92. Advised ER now, pt agreed. Additional Follow-up by: Lamar Sprinkles, CMA,  May 02, 2009 8:36 AM

## 2010-02-06 NOTE — Progress Notes (Signed)
Summary: alternative  Phone Note From Pharmacy   Caller: [facility]  [place]. [phone]* Summary of Call: Received fax from pharmacist stating pt no able to pay $80copay for aggrenox. Pharmacy is requesting a substitute if possible. Please advise Thanks.Alvy Beal Archie CMA  July 31, 2009 3:41 PM   Follow-up for Phone Call        there is no substitute Follow-up by: Etta Grandchild MD,  July 31, 2009 3:50 PM  Additional Follow-up for Phone Call Additional follow up Details #1::        If patient can not pay for med, is it ok to stop?  Additional Follow-up by: Lamar Sprinkles, CMA,  August 01, 2009 2:03 PM    Additional Follow-up for Phone Call Additional follow up Details #2::    no Follow-up by: Etta Grandchild MD,  August 02, 2009 7:19 AM  Additional Follow-up for Phone Call Additional follow up Details #3:: Details for Additional Follow-up Action Taken: RX printed to accompany patient assist. app. Lucious Groves CMA  August 02, 2009 10:01 AM   Left detailed vm on hm # for pt to contact office w/any questions but here is no alternative and she should continue medication. Also that we are attempting to set pt up for assistance thru pharm company. ...........Marland KitchenLamar Sprinkles, CMA  August 02, 2009 6:15 PM   New/Updated Medications: AGGRENOX 25-200 MG XR12H-CAP (ASPIRIN-DIPYRIDAMOLE) Take 1 tablet by mouth two times a day Prescriptions: AGGRENOX 25-200 MG XR12H-CAP (ASPIRIN-DIPYRIDAMOLE) Take 1 tablet by mouth two times a day  #180 x 0   Entered by:   Lucious Groves CMA   Authorized by:   Etta Grandchild MD   Signed by:   Lucious Groves CMA on 08/02/2009   Method used:   Print then Give to Patient   RxID:   [id number]

## 2010-02-06 NOTE — Miscellaneous (Signed)
Summary: Certification & treatment plan/[facility]  Certification & treatment plan/[facility]   Imported By: Sherian Rein 08/10/2009 09:41:27  _____________________________________________________________________  External Attachment:    Type:   Image     Comment:   External Document

## 2010-02-06 NOTE — Miscellaneous (Signed)
Summary: OT & ST Eval Order/[facility]  OT & ST Eval Order/[facility]   Imported By: Sherian Rein 07/13/2009 09:44:42  _____________________________________________________________________  External Attachment:    Type:   Image     Comment:   External Document

## 2010-02-06 NOTE — Miscellaneous (Signed)
Summary: Plan of Treatment/[facility]  Plan of Treatment/[facility]   Imported By: Sherian Rein 06/29/2009 10:43:52  _____________________________________________________________________  External Attachment:    Type:   Image     Comment:   External Document

## 2010-02-06 NOTE — Miscellaneous (Signed)
Summary: Admission & Plan of Care/[facility]  Admission & Plan of Care/[facility]   Imported By: Sherian Rein 06/26/2009 07:43:20  _____________________________________________________________________  External Attachment:    Type:   Image     Comment:   External Document

## 2010-02-06 NOTE — Progress Notes (Signed)
Summary: Aggrenox  Phone Note From Pharmacy   Caller: [facility]  [place]. [phone]* Summary of Call: Pharm called again regarding Aggrenox. Patient may not be able to afford medication due to high monthly cost. Pharm reccomended persantine and asprin 81mg . Persantine comes in 25, 50 and 75mg , Immediate release - They reccomend 75mg  1 two times a day in addition to otc asprin.   Please advise.  Initial call taken by: Lamar Sprinkles, CMA,  August 10, 2009 1:33 PM  Follow-up for Phone Call        this will have to be approved by her neuorlogist Follow-up by: Etta Grandchild MD,  August 10, 2009 1:42 PM  Additional Follow-up for Phone Call Additional follow up Details #1::        Pharm on the line & has 2 waiting, will call back tomorrow..................Marland KitchenLamar Sprinkles, CMA  August 10, 2009 5:37 PM      Additional Follow-up for Phone Call Additional follow up Details #2::    Contacted pharmacist Sherrine Maples who was very rude and aggressive. Sherrine Maples stated TLJ needed to forward information to pt's Neurologist. TLJ was contacted because he Rx medication. I contacted pt who informed me that she saw Dr Pearlean Brownie once in Feb or March 2011 and she does not believe he will okay alternate. Please advise. Margaret Pyle, CMA  August 11, 2009 9:59 AM  Follow-up by: Etta Grandchild MD,  August 11, 2009 10:05 AM  Additional Follow-up for Phone Call Additional follow up Details #3:: Details for Additional Follow-up Action Taken: I agree with staying on Aggrenox- she has had several strokes and we just can't  take the risk of using a less effective medicine. Pt and spouse state that Pharmacist Sherrine Maples advised that alternative is better for pt and cheaper. They are not in agreement with TLJ decision to stay on Aggrenox and will look for another PCP. I advised pt that TLJ is making a decision based on Medical history and pharmacist may not have all that information. Pt and spouse still state that they will find  a new MD because "pharmacist know more about drugs that a Doctor does". Margaret Pyle, CMA  August 11, 2009 10:39 AM  Additional Follow-up by: Etta Grandchild MD,  August 11, 2009 10:06 AM

## 2010-02-06 NOTE — Miscellaneous (Signed)
Summary: Plan/[facility]  Plan/[facility]   Imported By: Lester [place] 04/26/2009 07:43:46  _____________________________________________________________________  External Attachment:    Type:   Image     Comment:   External Document

## 2010-02-06 NOTE — Assessment & Plan Note (Signed)
Summary: discharged from [facility] rehab-lb   Vital Signs:  Patient profile:   70 year old female Height:      60.75 inches Weight:      151 pounds BMI:     28.87 O2 Sat:      94 % on Room air Temp:     97.3 degrees F oral Pulse rate:   83 / minute Pulse rhythm:   regular Resp:     16 per minute BP sitting:   124 / 80  (left arm) Cuff size:   large  Vitals Entered By: Rock Nephew CMA (June 23, 2009 4:32 PM)  Nutrition Counseling: Patient's BMI is greater than 25 and therefore counseled on weight management options.  O2 Flow:  Room air   Primary Care Provider:  Etta Grandchild MD   History of Present Illness:  Follow-Up Visit      This is a 70 year old woman who presents for Follow-up visit.  The patient denies chest pain, palpitations, dizziness, syncope, low blood sugar symptoms, high blood sugar symptoms, edema, SOB, DOE, PND, and orthopnea.  Since the last visit the patient notes a recent hospitilization and being seen by a specialist.  The patient reports taking meds as prescribed, monitoring BP, monitoring blood sugars, and dietary compliance.  When questioned about possible medication side effects, the patient notes dry cough.  She has been readmitted to [facility] for new cva and ? sez dosorder. She had a long rehab. stay at [facility] but she does not think she has any new deficits except some speech difficulty.  Preventive Screening-Counseling & Management  Alcohol-Tobacco     Alcohol drinks/day: 0     Smoking Status: never     Year Quit: 1967     Pack years: less than 1 ppd  Hep-HIV-STD-Contraception     Hepatitis Risk: no risk noted     HIV Risk: no risk noted     STD Risk: no risk noted      Drug Use:  no.    Clinical Review Panels:  Lipid Management   Cholesterol:  121 (04/04/2009)   LDL (bad choesterol):  58 (04/04/2009)   HDL (good cholesterol):  46.00 (04/04/2009)  Diabetes Management   HgBA1C:  5.4 (04/04/2009)   Creatinine:  0.7  (04/04/2009)   Last Flu Vaccine:  Fluvax 3+ (11/22/2008)   Last Pneumovax:  Pneumovax (10/24/2000)  CBC   WBC:  8.0 (12/14/2008)   RBC:  4.40 (12/14/2008)   Hgb:  13.0 (12/14/2008)   Hct:  39.1 (12/14/2008)   Platelets:  261.0 (12/14/2008)   MCV  89.0 (12/14/2008)   MCHC  33.3 (12/14/2008)   RDW  12.4 (12/14/2008)   PMN:  54.6 (12/14/2008)   Lymphs:  34.5 (12/14/2008)   Monos:  7.5 (12/14/2008)   Eosinophils:  2.5 (12/14/2008)   Basophil:  0.9 (12/14/2008)  Complete Metabolic Panel   Glucose:  90 (04/04/2009)   Sodium:  142 (04/04/2009)   Potassium:  4.9 (04/04/2009)   Chloride:  98 (04/04/2009)   CO2:  35 (04/04/2009)   BUN:  11 (04/04/2009)   Creatinine:  0.7 (04/04/2009)   Albumin:  4.2 (04/04/2009)   Total Protein:  7.0 (12/14/2008)   Calcium:  10.1 (04/04/2009)   Total Bili:  0.6 (12/14/2008)   Alk Phos:  67 (12/14/2008)   SGPT (ALT):  23 (04/04/2009)   SGOT (AST):  25 (04/04/2009)   Medications Prior to Update: 1)  Clonidine Hcl 0.1 Mg  Tabs (  Clonidine Hcl) .... Take One By Mouth Daily 2)  Evista 60 Mg  Tabs (Raloxifene Hcl) .... Take One By Mouth Daily 3)  Centrum   Tabs (Multiple Vitamins-Minerals) .... Take One By Mouth Daily 4)  Vitamin D 1000 Unit  Tabs (Cholecalciferol) .... Take One By Mouth Daily 5)  Sublingual B12 (Over The Counter) .Marland Kitchen.. 1 By Mouth Qd 6)  Probiotic- For Colon Over The Counter 7)  Potassium Chloride Cr 10 Meq  Tbcr (Potassium Chloride) .... Take One By Mouth Daily 8)  Astelin 137 Mcg/spray Soln (Azelastine Hcl) .... 2 Sprays Per Nostril Once Daily As Needed 9)  Fish Oil 500 Mg Caps (Omega-3 Fatty Acids) .... Take Two By Mouth Daily 10)  Calcium 500 Mg Tabs (Calcium) .... Take Two By Mouth Twice A Day 11)  Zanaflex 4 Mg Tabs (Tizanidine Hcl) .... Take One Four Times A Day 12)  Claritin 10 Mg Tabs (Loratadine) .... Otc As Directed. 13)  Protonix 40 Mg Tbec (Pantoprazole Sodium) .... Take 1 Tablet By Mouth Once A Day 14)   Hydrochlorothiazide 25 Mg Tabs (Hydrochlorothiazide) .... Take 1 Tablet By Mouth Once A Day 15)  Altace 5 Mg Caps (Ramipril) .... Take 1 Capsule By Mouth Once A Day 16)  Zocor 40 Mg Tabs (Simvastatin) .... Take One At Bedtime 17)  Neurontin 300 Mg Caps (Gabapentin) .... Take One Three Times A Day 18)  Tylenol Extra Strength 500 Mg Tabs (Acetaminophen) .... Otc As Directed. 19)  Xanax 0.5 Mg Tabs (Alprazolam) .... Take One By Mouth At Bedtime As Needed Anxiety 20)  Celexa 10 Mg Tabs (Citalopram Hydrobromide) .... Take One Tablet in Am 21)  Plavix 75 Mg Tabs (Clopidogrel Bisulfate) .... Take 1 Tablet By Mouth Once A Day 22)  Tramadol Hcl 50 Mg Tabs (Tramadol Hcl) .... One By Mouth Qid As Needed For Pain  Current Medications (verified): 1)  Clonidine Hcl 0.1 Mg  Tabs (Clonidine Hcl) .... Take 1 Tablet By Mouth Two Times A Day 2)  Probiotic- For Colon Over The Counter 3)  Potassium Chloride Cr 10 Meq  Tbcr (Potassium Chloride) .... Take One By Mouth Daily 4)  Zanaflex 4 Mg Tabs (Tizanidine Hcl) .... Take One Four Times A Day 5)  Hydrochlorothiazide 25 Mg Tabs (Hydrochlorothiazide) .... Take 1 Tablet By Mouth Once A Day 6)  Neurontin 300 Mg Caps (Gabapentin) .... Take One Three Times A Day 7)  Celexa 10 Mg Tabs (Citalopram Hydrobromide) .... Take One Tablet in Am 8)  Benzonatate 100 Mg Caps (Benzonatate) .... Take 1 Tablet By Mouth Three Times A Day As Needed 9)  Lisinopril 10 Mg Tabs (Lisinopril) .... Take 1 Tablet By Mouth Once A Day 10)  Keppra 500 Mg Tabs (Levetiracetam) .... Take 1 Tablet By Mouth Two Times A Day 11)  Gabapentin 300 Mg Caps (Gabapentin) .... Take 1 Tablet By Mouth Three Times A Day 12)  Levetiracetam 500 Mg Tabs (Levetiracetam) .... Two Times A Day 13)  Aggrenox 25-200 Mg Xr12h-Cap (Aspirin-Dipyridamole) .... Take 1 Tablet By Mouth Two Times A Day 14)  Amox Tr - K 875-125mg  .... [id number]  Allergies (verified): 1)  Tetracycline 2)  Ace Inhibitors  Past History:  Past  Surgical History: Last updated: 01/03/2009 Appendectomy GYN surgery- D & C Hysterectomy- partial, abn. pap (1971) Sinus surgery- polyp removed Tonsillectomy Implants in jaw/ bone grafts Foot surgery Dexa- osteopenia (02/2001) Dexa- stable/ mixed trend (11/2004) Colonoscopy- polyp (01/2005) Fallopian tube removed after ectopic pregnancy colonoscopy adenom colon polyp -- re  check ? 2or 3 years  CVA - lacunar R 12/10  Family History: Last updated: 10/22/2007 Father: heart disease, liver and lung cancer, ETOH, emphysema Mother: MI, borderline DM Siblings:  brother  CAD, died 06/12/22 CHF - also drug abuser Paunt breast and bone cancer depression - in aunt and brother  Social History: Last updated: 04/13/2009 Marital Status: Married Children: 3 Retired Never Smoked Alcohol use-no Drug use-no Regular exercise-no  Risk Factors: Alcohol Use: 0 (06/23/2009) Exercise: no (04/13/2009)  Risk Factors: Smoking Status: never (06/23/2009)  Past Medical History: Hyperlipidemia Hypertension Hypothyroidism Osteopenia rosacea remote former smoking colon polyp adenoma CVA depression/ anx after stroke -- imp with celexa   derm [facility]  neuro -- Dr Pearlean Brownie   Family History: Reviewed history from 10/22/2007 and no changes required. Father: heart disease, liver and lung cancer, ETOH, emphysema Mother: MI, borderline DM Siblings:  brother  CAD, died Jun 12, 2022 CHF - also drug abuser Paunt breast and bone cancer depression - in aunt and brother  Social History: Reviewed history from 04/13/2009 and no changes required. Marital Status: Married Children: 3 Retired Never Smoked Alcohol use-no Drug use-no Regular exercise-no Hepatitis Risk:  no risk noted HIV Risk:  no risk noted STD Risk:  no risk noted  Review of Systems  The patient denies abdominal pain.    Physical Exam  General:  overweight but generally well appearing poorly cooperative to examination and unable to  place on exam table.   Head:  normocephalic, atraumatic, no abnormalities observed, and no abnormalities palpated.   Mouth:  pharynx pink and moist.   Neck:  supple, full ROM, no masses, no thyromegaly, no thyroid nodules or tenderness, and no cervical lymphadenopathy.   Lungs:  Normal respiratory effort, chest expands symmetrically. Lungs are clear to auscultation, no crackles or wheezes. Heart:  Normal rate and regular rhythm. S1 and S2 normal without gallop, murmur, click, rub or other extra sounds. Abdomen:  soft, non-tender, normal bowel sounds, no distention, no masses, no guarding, no rigidity, no rebound tenderness, no abdominal hernia, no inguinal hernia, no hepatomegaly, and no splenomegaly.   Msk:  normal ROM, no joint tenderness, no joint swelling, no joint warmth, no redness over joints, and enlarged MCP joints.   Pulses:  R and L carotid,radial,femoral,dorsalis pedis and posterior tibial pulses are full and equal bilaterally Extremities:  trace left pedal edema and trace right pedal edema.   Neurologic:  alert & oriented X3, cranial nerves II-XII intact, abnormal gait, LUE hyperreflexia, LUE weakness, LUE sensory loss, LLE hyperreflexia, LLE weakness, LLE sensory loss, and left hemiparesis.   Skin:  turgor normal, color normal, no rashes, no suspicious lesions, no ecchymoses, no petechiae, no purpura, no ulcerations, and no edema.   Cervical Nodes:  no anterior cervical adenopathy and no posterior cervical adenopathy.   Psych:  Oriented X3, memory intact for recent and remote, normally interactive, good eye contact, not anxious appearing, not depressed appearing, not agitated, and not suicidal.     Impression & Recommendations:  Problem # 1:  HYPOKALEMIA (ICD-276.8) Assessment Unchanged  Orders: Venipuncture (46962) TLB-BMP (Basic Metabolic Panel-BMET) (80048-METABOL) TLB-Magnesium (Mg) (83735-MG) TLB-A1C / Hgb A1C (Glycohemoglobin) (83036-A1C)  Problem # 2:  HYPERGLYCEMIA  (ICD-790.29) Assessment: Unchanged  Orders: Venipuncture (95284) TLB-BMP (Basic Metabolic Panel-BMET) (80048-METABOL) TLB-Magnesium (Mg) (83735-MG) TLB-A1C / Hgb A1C (Glycohemoglobin) (83036-A1C)  Labs Reviewed: Creat: 0.7 (04/04/2009)     Problem # 3:  LACUNAR INFARCTION (ICD-434.91) Assessment: Deteriorated  The following medications were removed from the medication list:  Plavix 75 Mg Tabs (Clopidogrel bisulfate) .Marland Kitchen... Take 1 tablet by mouth once a day Her updated medication list for this problem includes:    Aggrenox 25-200 Mg Xr12h-cap (Aspirin-dipyridamole) .Marland Kitchen... Take 1 tablet by mouth two times a day  Orders: Physical Therapy Referral (PT)  Problem # 4:  HYPERTENSION (ICD-401.9) Assessment: Unchanged  The following medications were removed from the medication list:    Altace 5 Mg Caps (Ramipril) .Marland Kitchen... Take 1 capsule by mouth once a day    Lisinopril 10 Mg Tabs (Lisinopril) .Marland Kitchen... Take 1 tablet by mouth once a day Her updated medication list for this problem includes:    Clonidine Hcl 0.1 Mg Tabs (Clonidine hcl) .Marland Kitchen... Take 1 tablet by mouth two times a day    Hydrochlorothiazide 25 Mg Tabs (Hydrochlorothiazide) .Marland Kitchen... Take 1 tablet by mouth once a day  BP today: 124/80 Prior BP: 132/80 (04/20/2009)  Prior 10 Yr Risk Heart Disease: 8 % (04/20/2009)  Labs Reviewed: K+: 4.9 (04/04/2009) Creat: : 0.7 (04/04/2009)   Chol: 121 (04/04/2009)   HDL: 46.00 (04/04/2009)   LDL: 58 (04/04/2009)   TG: 86.0 (04/04/2009)  Complete Medication List: 1)  Clonidine Hcl 0.1 Mg Tabs (Clonidine hcl) .... Take 1 tablet by mouth two times a day 2)  Probiotic- For Colon Over The Counter  3)  Potassium Chloride Cr 10 Meq Tbcr (Potassium chloride) .... Take one by mouth daily 4)  Zanaflex 4 Mg Tabs (Tizanidine hcl) .... Take one four times a day 5)  Hydrochlorothiazide 25 Mg Tabs (Hydrochlorothiazide) .... Take 1 tablet by mouth once a day 6)  Neurontin 300 Mg Caps (Gabapentin) .... Take  one three times a day 7)  Celexa 10 Mg Tabs (Citalopram hydrobromide) .... Take one tablet in am 8)  Benzonatate 100 Mg Caps (Benzonatate) .... Take 1 tablet by mouth three times a day as needed 9)  Keppra 500 Mg Tabs (Levetiracetam) .... Take 1 tablet by mouth two times a day 10)  Gabapentin 300 Mg Caps (Gabapentin) .... Take 1 tablet by mouth three times a day 11)  Levetiracetam 500 Mg Tabs (Levetiracetam) .... Two times a day 12)  Aggrenox 25-200 Mg Xr12h-cap (Aspirin-dipyridamole) .... Take 1 tablet by mouth two times a day 13)  Amox Tr - K 875-125mg   .... Delacruz.Gulling  Patient Instructions: 1)  Please schedule a follow-up appointment in 2 months.

## 2010-02-06 NOTE — Progress Notes (Signed)
Summary: [facility] Order  Phone Note From Other Clinic   Caller: [phone] Debra - [facility] Summary of Call: [facility] is req verbal order for Child psychotherapist.  Initial call taken by: Lamar Sprinkles, CMA,  April 27, 2009 10:57 AM  Follow-up for Phone Call        ok Follow-up by: Etta Grandchild MD,  April 27, 2009 11:07 AM  Additional Follow-up for Phone Call Additional follow up Details #1::        Debra informed Additional Follow-up by: Margaret Pyle, CMA,  April 27, 2009 2:38 PM

## 2010-02-06 NOTE — Miscellaneous (Signed)
Summary: Order/[facility]  [facility]   Imported By: Lester [place] 08/01/2009 10:16:48  _____________________________________________________________________  External Attachment:    Type:   Image     Comment:   External Document

## 2010-02-06 NOTE — Progress Notes (Signed)
Summary: Lisinopril?  Phone Note From Pharmacy   Caller: [facility]  [employer]. [phone]* Summary of Call: Pharmacy called. Pt told them she is c/o cough and is on lisinopril. They are requesting alternative. I don't see on med list.  Initial call taken by: Lamar Sprinkles, CMA,  June 26, 2009 1:23 PM  Follow-up for Phone Call        she should not be on an ACEI, it causes her to cough, no substitute needed for now Follow-up by: Etta Grandchild MD,  June 26, 2009 1:24 PM  Additional Follow-up for Phone Call Additional follow up Details #1::        Left vm for pharmacy Additional Follow-up by: Lamar Sprinkles, CMA,  June 26, 2009 3:30 PM

## 2010-02-06 NOTE — Miscellaneous (Signed)
Summary: Physician's Interim Orders/Hospice @ [facility]  Physician's Interim Orders/[facility]osp[facility] @ [place]   Imported By: Sherian Rein 05/16/2009 10:53:11  _____________________________________________________________________  External Attachment:    Type:   Image     Comment:   External Document

## 2010-02-06 NOTE — Progress Notes (Signed)
  Phone Note Outgoing Call   Summary of Call: i think she needs to do an mri of her left shoulder Initial call taken by: Etta Grandchild MD,  April 20, 2009 5:12 PM  Follow-up for Phone Call       Follow-up by: Etta Grandchild MD,  April 20, 2009 5:12 PM  Additional Follow-up for Phone Call Additional follow up Details #1::        [facility] notified and pt is aware of appt. Additional Follow-up by: Rock Nephew CMA,  April 21, 2009 1:18 PM  New Problems: SHOULDER IMPINGEMENT SYNDROME, LEFT (ICD-726.2)   New Problems: SHOULDER IMPINGEMENT SYNDROME, LEFT (ICD-726.2)

## 2010-02-06 NOTE — Miscellaneous (Signed)
Summary: Orders/Hospice @ [facility]  Orders/Hospice @ [place]   Imported By: Sherian Rein 06/29/2009 10:44:43  _____________________________________________________________________  External Attachment:    Type:   Image     Comment:   External Document

## 2010-02-06 NOTE — Progress Notes (Signed)
Summary: wants something for pain  Phone Note Call from Patient Call back at Home Phone [phone] Call back at [phone]   Caller: Patient Call For: Judith Part MD Summary of Call: Patient says that at her last appt. with you she told you she was taking extra strength tylenol. She says that now she feels like she could use something stronger because it is not really helping her anymore. She has been taking two before bedtime and ends up waking up after a few hours in pain and takes more tylenol. She wants to know if she can get something called in to [facility].  Initial call taken by: Melody Comas,  April 10, 2009 12:03 PM  Follow-up for Phone Call        pain where- ? please be more specific/ ? muscle pain/ spasm  Follow-up by: Judith Part MD,  April 10, 2009 12:18 PM  Additional Follow-up for Phone Call Additional follow up Details #1::        Left message for patient to call back. Lewanda Rife LPN  April 11, 6431 3:20 PM   Pt said constant pain in left shoulder. Sharp pain usually worse at night.Pain level now is an 8. Pt said at times left arm spasm and her left arm is rigid with pain. Please advise. Lewanda Rife LPN  April 10, 2949 3:41 PM   New Problems: SHOULDER PAIN [phone])   Additional Follow-up for Phone Call Additional follow up Details #2::    her zanaflex is muscle relaxer for spasm-- is she taking that 4 times per day? - please let me know  Follow-up by: Judith Part MD,  April 10, 2009 3:48 PM  Additional Follow-up for Phone Call Additional follow up Details #3:: Details for Additional Follow-up Action Taken: Pt is taking Zanaflex four times a day. Pt said she takes it about every 4 hours from 8:00am until 8:00pm. Pt takes at 8am, 12noon, 4pm and 8pm.[facility] LPN  April 11, 3014 4:34 PM   she is both on muscle relaxer and neurontin-- should have good pain control this makes me wonder if something else is going on with her  shoulder besides just spasm I am apprehensive to add narcotic in this situation- already on too much sedating med,and cannot have nsaid due to recent stroke I would like to ref to ortho if agreeable - let me know    I will do ref and route to [facility] Additional Follow-up by: Judith Part MD,  April 10, 2009 4:46 PM  New Problems: SHOULDER PAIN [phone])  Patient notified as instructed by telephone. Pt was agreeable to see orthopedist. Pt wanted me to call Verdon Cummins on cell [phone]. I did but got his v/m. I called pt back and pt will talk with husband about who they would like to see as ortho. Pt may call back 04/11/09 or may wait to hear from [facility]. Pt can be reached at home # [phone].Lewanda Rife LPN  April 11, 5730 5:00 PM

## 2010-02-06 NOTE — Assessment & Plan Note (Signed)
Summary: FELL LAST NIGHT/ SORE FROM FALL/ DR TOWER'S PT/NWS   Vital Signs:  Patient profile:   70 year old female Height:      60.75 inches Weight:      172.25 pounds BMI:     32.93 O2 Sat:      95 % on Room air Temp:     97.2 degrees F oral Pulse rate:   67 / minute Pulse rhythm:   regular Resp:     16 per minute BP sitting:   102 / 54  (right arm) Cuff size:   large  Vitals Entered By: Rock Nephew CMA (April 13, 2009 1:30 PM)  O2 Flow:  Room air CC: Pt c/o buttock pain due to recent fall, Hypertension Management  Does patient need assistance? Ambulation Wheelchair   Primary Care Provider:  Tower  CC:  Pt c/o buttock pain due to recent fall and Hypertension Management.  History of Present Illness: New to me she complains of low blood pressure with syncope yesterday as she was getting out of her wheelchair. She feel down and landed on her buttocks and "twisted" a muscle under her sternum. She has been feeling generalized weakness lately but no new focal deficits and is mobile with a 4-prong walker. She has mild discomfort in both "butt cheeks" and soreness under her strenum that occurs with movement.  She has been having left shoulder pain, especially while trying to sleep at night and she wants something for pain.  Hypertension History:      She complains of peripheral edema, neurologic problems, syncope, and side effects from treatment, but denies headache, palpitations, and visual symptoms.  She notes the following problems with antihypertensive medication side effects: low BP and weakness.        Positive major cardiovascular risk factors include female age 19 years old or older, hyperlipidemia, and hypertension.  Negative major cardiovascular risk factors include no history of diabetes and non-tobacco-user status.        Positive history for target organ damage include prior stroke (or TIA).  Further assessment for target organ damage reveals no history of ASHD, cardiac  end-organ damage (CHF/LVH), peripheral vascular disease, renal insufficiency, or hypertensive retinopathy.     Preventive Screening-Counseling & Management  Alcohol-Tobacco     Smoking Status: never  Caffeine-Diet-Exercise     Does Patient Exercise: no      Drug Use:  no.    Current Medications (verified): 1)  Clonidine Hcl 0.1 Mg  Tabs (Clonidine Hcl) .... Take One By Mouth Daily 2)  Evista 60 Mg  Tabs (Raloxifene Hcl) .... Take One By Mouth Daily 3)  Centrum   Tabs (Multiple Vitamins-Minerals) .... Take One By Mouth Daily 4)  Vitamin D 1000 Unit  Tabs (Cholecalciferol) .... Take One By Mouth Daily 5)  Sublingual B12 (Over The Counter) .Marland Kitchen.. 1 By Mouth Qd 6)  Probiotic- For Colon Over The Counter 7)  Potassium Chloride Cr 10 Meq  Tbcr (Potassium Chloride) .... Take One By Mouth Daily 8)  Astelin 137 Mcg/spray Soln (Azelastine Hcl) .... 2 Sprays Per Nostril Once Daily As Needed 9)  Fish Oil 500 Mg Caps (Omega-3 Fatty Acids) .... Take Two By Mouth Daily 10)  Cardizem Cd 240 Mg Xr24h-Cap (Diltiazem Hcl Coated Beads) .... Take One By Mouth Daily 11)  Calcium 500 Mg Tabs (Calcium) .... Take Two By Mouth Twice A Day 12)  Zanaflex 4 Mg Tabs (Tizanidine Hcl) .... Take One Four Times A Day 13)  Claritin 10 Mg Tabs (Loratadine) .... Otc As Directed. 14)  Protonix 40 Mg Tbec (Pantoprazole Sodium) .... Take 1 Tablet By Mouth Once A Day 15)  Hydrochlorothiazide 25 Mg Tabs (Hydrochlorothiazide) .... Take 1 Tablet By Mouth Once A Day 16)  Altace 5 Mg Caps (Ramipril) .... Take 1 Capsule By Mouth Once A Day 17)  Zocor 40 Mg Tabs (Simvastatin) .... Take One At Bedtime 18)  Neurontin 300 Mg Caps (Gabapentin) .... Take One Three Times A Day 19)  Tylenol Extra Strength 500 Mg Tabs (Acetaminophen) .... Otc As Directed. 20)  Xanax 0.5 Mg Tabs (Alprazolam) .... Take One By Mouth At Bedtime As Needed Anxiety 21)  Celexa 10 Mg Tabs (Citalopram Hydrobromide) .... Take One Tablet in Am 22)  Norvasc 5 Mg Tabs  (Amlodipine Besylate) .... Take One Daily At 10:00pm 23)  Plavix 75 Mg Tabs (Clopidogrel Bisulfate) .Marland Kitchen.. 1 By Mouth Once Daily 24)  Plavix 75 Mg Tabs (Clopidogrel Bisulfate) .... Take 1 Tablet By Mouth Once A Day  Allergies (verified): 1)  Tetracycline 2)  Ace Inhibitors  Past History:  Past Medical History: Reviewed history from 04/04/2009 and no changes required. Hyperlipidemia Hypertension Hypothyroidism Osteopenia rosacea remote former smoking colon polyp adenoma CVA depression/ anx after stroke -- imp with celexa   derm Orson Aloe  neuro -- Dr Pearlean Brownie  Dr Hilliard Clark - ? rehab   Past Surgical History: Reviewed history from 01/03/2009 and no changes required. Appendectomy GYN surgery- D & C Hysterectomy- partial, abn. pap (1971) Sinus surgery- polyp removed Tonsillectomy Implants in jaw/ bone grafts Foot surgery Dexa- osteopenia (02/2001) Dexa- stable/ mixed trend (11/2004) Colonoscopy- polyp (01/2005) Fallopian tube removed after ectopic pregnancy colonoscopy adenom colon polyp -- re check ? 2or 3 years  CVA - lacunar R 12/10  Family History: Reviewed history from 10/22/2007 and no changes required. Father: heart disease, liver and lung cancer, ETOH, emphysema Mother: MI, borderline DM Siblings:  brother  CAD, died 06-14-2022 CHF - also drug abuser Paunt breast and bone cancer depression - in aunt and brother  Social History: Reviewed history from 10/13/2007 and no changes required. Marital Status: Married Children: 3 Retired Never Smoked Alcohol use-no Drug use-no Regular exercise-no Smoking Status:  never Drug Use:  no Does Patient Exercise:  no  Review of Systems       The patient complains of chest pain, syncope, peripheral edema, and difficulty walking.  The patient denies anorexia, fever, weight loss, vision loss, decreased hearing, prolonged cough, headaches, hemoptysis, abdominal pain, melena, hematochezia, severe indigestion/heartburn, hematuria,  incontinence, suspicious skin lesions, and angioedema.   CV:  Complains of fainting, fatigue, lightheadness, swelling of feet, and swelling of hands; denies chest pain or discomfort, difficulty breathing at night, difficulty breathing while lying down, leg cramps with exertion, palpitations, shortness of breath with exertion, and weight gain. MS:  Complains of muscle aches and stiffness; denies joint pain, joint redness, joint swelling, low back pain, mid back pain, and thoracic pain.  Physical Exam  General:  alert, well-developed, well-nourished, and well-hydrated.   Head:  normocephalic, atraumatic, no abnormalities observed, and no abnormalities palpated.   Eyes:  vision grossly intact, pupils equal, pupils round, and pupils reactive to light.   Neck:  supple, full ROM, no masses, no thyromegaly, no thyroid nodules or tenderness, and no cervical lymphadenopathy.   Chest Wall:  no deformities, no tenderness, and no mass.   Lungs:  Normal respiratory effort, chest expands symmetrically. Lungs are clear to auscultation, no crackles or wheezes.  Heart:  Normal rate and regular rhythm. S1 and S2 normal without gallop, murmur, click, rub or other extra sounds. Abdomen:  soft, non-tender, normal bowel sounds, no distention, no masses, no guarding, no rigidity, no rebound tenderness, no abdominal hernia, no inguinal hernia, no hepatomegaly, and no splenomegaly.   Msk:  normal ROM, no joint tenderness, no joint swelling, no joint warmth, no redness over joints, no joint deformities, no joint instability, and no crepitation.   Pulses:  R and L carotid,radial,femoral,dorsalis pedis and posterior tibial pulses are full and equal bilaterally Extremities:  1+ left pedal edema and trace right pedal edema.   Neurologic:  alert & oriented X3, cranial nerves II-XII intact, abnormal gait, LUE hyperreflexia, LUE weakness, LUE sensory loss, LLE hyperreflexia, LLE weakness, LLE sensory loss, and left hemiparesis.     Skin:  turgor normal, color normal, no rashes, no suspicious lesions, no ecchymoses, no petechiae, no purpura, no ulcerations, and no edema.   Cervical Nodes:  no anterior cervical adenopathy and no posterior cervical adenopathy.   Axillary Nodes:  no R axillary adenopathy and no L axillary adenopathy.   Inguinal Nodes:  no R inguinal adenopathy and no L inguinal adenopathy.   Psych:  Oriented X3, memory intact for recent and remote, normally interactive, good eye contact, not anxious appearing, not depressed appearing, not agitated, and not suicidal.     Detailed Back/Spine Exam  Lumbosacral Exam:  Inspection-deformity:    Normal Palpation-spinal tenderness:  Normal Sitting Straight Leg Raise:    Right:  negative    Left:  negative Contralateral Straight Leg Raise:    Right:  negative    Left:  negative   Impression & Recommendations:  Problem # 1:  DEGENERATIVE JOINT DISEASE (ICD-715.90) Assessment New  Her updated medication list for this problem includes:    Tylenol Extra Strength 500 Mg Tabs (Acetaminophen) ..... Otc as directed.    Tramadol Hcl 50 Mg Tabs (Tramadol hcl) ..... One by mouth qid as needed for pain  Problem # 2:  HIP/THIGH INJURY (ICD-959.6) Assessment: New  Orders: T-Bilateral Hip w/Pelvis, min 2 views (73520TC)  Problem # 3:  CHEST WALL INJURY (ICD-959.11) Assessment: New  Orders: T-2 View CXR (71020TC)  Problem # 4:  HYPERTENSION (ICD-401.9) Assessment: Deteriorated  BP is too low, will stop the CCB's The following medications were removed from the medication list:    Cardizem Cd 240 Mg Xr24h-cap (Diltiazem hcl coated beads) .Marland Kitchen... Take one by mouth daily    Norvasc 5 Mg Tabs (Amlodipine besylate) .Marland Kitchen... Take one daily at 10:00pm Her updated medication list for this problem includes:    Clonidine Hcl 0.1 Mg Tabs (Clonidine hcl) .Marland Kitchen... Take one by mouth daily    Hydrochlorothiazide 25 Mg Tabs (Hydrochlorothiazide) .Marland Kitchen... Take 1 tablet by mouth  once a day    Altace 5 Mg Caps (Ramipril) .Marland Kitchen... Take 1 capsule by mouth once a day  BP today: 102/54 Prior BP: 110/64 (04/04/2009)  Labs Reviewed: K+: 4.9 (04/04/2009) Creat: : 0.7 (04/04/2009)   Chol: 121 (04/04/2009)   HDL: 46.00 (04/04/2009)   LDL: 58 (04/04/2009)   TG: 86.0 (04/04/2009)  Problem # 5:  LACUNAR INFARCTION (ICD-434.91) Assessment: Unchanged  The following medications were removed from the medication list:    Plavix 75 Mg Tabs (Clopidogrel bisulfate) .Marland Kitchen... 1 by mouth once daily Her updated medication list for this problem includes:    Plavix 75 Mg Tabs (Clopidogrel bisulfate) .Marland Kitchen... Take 1 tablet by mouth once a day  Complete Medication  List: 1)  Clonidine Hcl 0.1 Mg Tabs (Clonidine hcl) .... Take one by mouth daily 2)  Evista 60 Mg Tabs (Raloxifene hcl) .... Take one by mouth daily 3)  Centrum Tabs (Multiple vitamins-minerals) .... Take one by mouth daily 4)  Vitamin D 1000 Unit Tabs (Cholecalciferol) .... Take one by mouth daily 5)  Sublingual B12 (over The Counter)  .Marland Kitchen.. 1 by mouth qd 6)  Probiotic- For Colon Over The Counter  7)  Potassium Chloride Cr 10 Meq Tbcr (Potassium chloride) .... Take one by mouth daily 8)  Astelin 137 Mcg/spray Soln (Azelastine hcl) .... 2 sprays per nostril once daily as needed 9)  Fish Oil 500 Mg Caps (Omega-3 fatty acids) .... Take two by mouth daily 10)  Calcium 500 Mg Tabs (Calcium) .... Take two by mouth twice a day 11)  Zanaflex 4 Mg Tabs (Tizanidine hcl) .... Take one four times a day 12)  Claritin 10 Mg Tabs (Loratadine) .... Otc as directed. 13)  Protonix 40 Mg Tbec (Pantoprazole sodium) .... Take 1 tablet by mouth once a day 14)  Hydrochlorothiazide 25 Mg Tabs (Hydrochlorothiazide) .... Take 1 tablet by mouth once a day 15)  Altace 5 Mg Caps (Ramipril) .... Take 1 capsule by mouth once a day 16)  Zocor 40 Mg Tabs (Simvastatin) .... Take one at bedtime 17)  Neurontin 300 Mg Caps (Gabapentin) .... Take one three times a  day 18)  Tylenol Extra Strength 500 Mg Tabs (Acetaminophen) .... Otc as directed. 19)  Xanax 0.5 Mg Tabs (Alprazolam) .... Take one by mouth at bedtime as needed anxiety 20)  Celexa 10 Mg Tabs (Citalopram hydrobromide) .... Take one tablet in am 21)  Plavix 75 Mg Tabs (Clopidogrel bisulfate) .... Take 1 tablet by mouth once a day 22)  Tramadol Hcl 50 Mg Tabs (Tramadol hcl) .... One by mouth qid as needed for pain  Hypertension Assessment/Plan:      The patient's hypertensive risk group is category C: Target organ damage and/or diabetes.  Her calculated 10 year risk of coronary heart disease is 5 %.  Today's blood pressure is 102/54.  Her blood pressure goal is < 140/90.  Patient Instructions: 1)  Please schedule a follow-up appointment in 2 weeks. 2)  Check your Blood Pressure regularly. If it is above 130/80: you should make an appointment. 3)  Take 650-1000mg  of Tylenol every 4-6 hours as needed for relief of pain or comfort of fever AVOID taking more than 4000mg   in a 24 hour period (can cause liver damage in higher doses). 4)  Most patients (90%) with low back pain will improve with time (2-6 weeks). Keep active but avoid activities that are painful. Apply moist heat and/or ice to lower back several times a day. Prescriptions: TRAMADOL HCL 50 MG TABS (TRAMADOL HCL) One by mouth QID as needed for pain  #120 x 11   Entered and Authorized by:   Etta Grandchild MD   Signed by:   Etta Grandchild MD on 04/13/2009   Method used:   Electronically to        [facility] [phone]* (retail)       [address] [place], [place]  [address]       Ph: [phone]       Fax: [phone]   RxID:   [id number]

## 2010-02-06 NOTE — Miscellaneous (Signed)
Summary: Orders/[facility]  Orders/[facility]   Imported By: Sherian Rein 07/28/2009 09:37:24  _____________________________________________________________________  External Attachment:    Type:   Image     Comment:   External Document

## 2010-02-06 NOTE — Assessment & Plan Note (Signed)
Summary: NEW MEDICARE PT PER DR JONES-FORMER DR TOWER PT--[facility]   Vital Signs:  Patient profile:   70 year old female Height:      60.75 inches Weight:      170 pounds BMI:     32.50 O2 Sat:      92 % on Room air Temp:     98.0 degrees F oral Pulse rate:   85 / minute Pulse rhythm:   regular Resp:     16 per minute BP sitting:   132 / 80  (left arm) Cuff size:   large  Vitals Entered By: Rock Nephew CMA (April 20, 2009 3:35 PM)  Nutrition Counseling: Patient's BMI is greater than 25 and therefore counseled on weight management options.  O2 Flow:  Room air  Primary Care Provider:  Etta Grandchild MD   History of Present Illness: She returns for f/up and she still has discomfort in her left shoulder. She recalls a fall while she was in the hospital but does not know if an xray was done of the left shoulder. She can get relief with tramadol.  Hypertension History:      She denies headache, chest pain, palpitations, dyspnea with exertion, orthopnea, PND, peripheral edema, visual symptoms, neurologic problems, syncope, and side effects from treatment.  She notes no problems with any antihypertensive medication side effects.        Positive major cardiovascular risk factors include female age 66 years old or older, hyperlipidemia, and hypertension.  Negative major cardiovascular risk factors include no history of diabetes and non-tobacco-user status.        Positive history for target organ damage include prior stroke (or TIA).  Further assessment for target organ damage reveals no history of ASHD, cardiac end-organ damage (CHF/LVH), peripheral vascular disease, renal insufficiency, or hypertensive retinopathy.     Current Medications (verified): 1)  Clonidine Hcl 0.1 Mg  Tabs (Clonidine Hcl) .... Take One By Mouth Daily 2)  Evista 60 Mg  Tabs (Raloxifene Hcl) .... Take One By Mouth Daily 3)  Centrum   Tabs (Multiple Vitamins-Minerals) .... Take One By Mouth Daily 4)  Vitamin  D 1000 Unit  Tabs (Cholecalciferol) .... Take One By Mouth Daily 5)  Sublingual B12 (Over The Counter) .Marland Kitchen.. 1 By Mouth Qd 6)  Probiotic- For Colon Over The Counter 7)  Potassium Chloride Cr 10 Meq  Tbcr (Potassium Chloride) .... Take One By Mouth Daily 8)  Astelin 137 Mcg/spray Soln (Azelastine Hcl) .... 2 Sprays Per Nostril Once Daily As Needed 9)  Fish Oil 500 Mg Caps (Omega-3 Fatty Acids) .... Take Two By Mouth Daily 10)  Calcium 500 Mg Tabs (Calcium) .... Take Two By Mouth Twice A Day 11)  Zanaflex 4 Mg Tabs (Tizanidine Hcl) .... Take One Four Times A Day 12)  Claritin 10 Mg Tabs (Loratadine) .... Otc As Directed. 13)  Protonix 40 Mg Tbec (Pantoprazole Sodium) .... Take 1 Tablet By Mouth Once A Day 14)  Hydrochlorothiazide 25 Mg Tabs (Hydrochlorothiazide) .... Take 1 Tablet By Mouth Once A Day 15)  Altace 5 Mg Caps (Ramipril) .... Take 1 Capsule By Mouth Once A Day 16)  Zocor 40 Mg Tabs (Simvastatin) .... Take One At Bedtime 17)  Neurontin 300 Mg Caps (Gabapentin) .... Take One Three Times A Day 18)  Tylenol Extra Strength 500 Mg Tabs (Acetaminophen) .... Otc As Directed. 19)  Xanax 0.5 Mg Tabs (Alprazolam) .... Take One By Mouth At Bedtime As Needed Anxiety 20)  Celexa  10 Mg Tabs (Citalopram Hydrobromide) .... Take One Tablet in Am 21)  Plavix 75 Mg Tabs (Clopidogrel Bisulfate) .... Take 1 Tablet By Mouth Once A Day 22)  Tramadol Hcl 50 Mg Tabs (Tramadol Hcl) .... One By Mouth Qid As Needed For Pain  Allergies (verified): 1)  Tetracycline 2)  Ace Inhibitors  Past History:  Past Medical History: Reviewed history from 04/04/2009 and no changes required. Hyperlipidemia Hypertension Hypothyroidism Osteopenia rosacea remote former smoking colon polyp adenoma CVA depression/ anx after stroke -- imp with celexa   derm Orson Aloe  neuro -- Dr Pearlean Brownie  Dr Hilliard Clark - ? rehab   Past Surgical History: Reviewed history from 01/03/2009 and no changes required. Appendectomy GYN  surgery- D & C Hysterectomy- partial, abn. pap (1971) Sinus surgery- polyp removed Tonsillectomy Implants in jaw/ bone grafts Foot surgery Dexa- osteopenia (02/2001) Dexa- stable/ mixed trend (11/2004) Colonoscopy- polyp (01/2005) Fallopian tube removed after ectopic pregnancy colonoscopy adenom colon polyp -- re check ? 2or 3 years  CVA - lacunar R 12/10  Family History: Reviewed history from 10/22/2007 and no changes required. Father: heart disease, liver and lung cancer, ETOH, emphysema Mother: MI, borderline DM Siblings:  brother  CAD, died 2022-06-10 CHF - also drug abuser Paunt breast and bone cancer depression - in aunt and brother  Social History: Reviewed history from 04/13/2009 and no changes required. Marital Status: Married Children: 3 Retired Never Smoked Alcohol use-no Drug use-no Regular exercise-no  Review of Systems  The patient denies anorexia, fever, weight loss, chest pain, syncope, peripheral edema, prolonged cough, headaches, hemoptysis, abdominal pain, suspicious skin lesions, depression, and angioedema.   Endo:  Denies cold intolerance, excessive hunger, excessive thirst, excessive urination, heat intolerance, polyuria, and weight change.  Physical Exam  General:  overweight but generally well appearing poorly cooperative to examination and unable to place on exam table.   Head:  normocephalic, atraumatic, no abnormalities observed, and no abnormalities palpated.   Mouth:  pharynx pink and moist.   Neck:  supple, full ROM, no masses, no thyromegaly, no thyroid nodules or tenderness, and no cervical lymphadenopathy.   Lungs:  Normal respiratory effort, chest expands symmetrically. Lungs are clear to auscultation, no crackles or wheezes. Heart:  Normal rate and regular rhythm. S1 and S2 normal without gallop, murmur, click, rub or other extra sounds. Abdomen:  soft, non-tender, normal bowel sounds, no distention, no masses, no guarding, no rigidity, no  rebound tenderness, no abdominal hernia, no inguinal hernia, no hepatomegaly, and no splenomegaly.   Msk:  normal ROM, no joint tenderness, no joint swelling, no joint warmth, no redness over joints, and enlarged MCP joints.   Pulses:  R and L carotid,radial,femoral,dorsalis pedis and posterior tibial pulses are full and equal bilaterally Extremities:  trace left pedal edema and trace right pedal edema.   Neurologic:  alert & oriented X3, cranial nerves II-XII intact, abnormal gait, LUE hyperreflexia, LUE weakness, LUE sensory loss, LLE hyperreflexia, LLE weakness, LLE sensory loss, and left hemiparesis.   Skin:  turgor normal, color normal, no rashes, no suspicious lesions, no ecchymoses, no petechiae, no purpura, no ulcerations, and no edema.   Cervical Nodes:  no anterior cervical adenopathy and no posterior cervical adenopathy.   Psych:  Oriented X3, memory intact for recent and remote, normally interactive, good eye contact, not anxious appearing, not depressed appearing, not agitated, and not suicidal.     Impression & Recommendations:  Problem # 1:  DEGENERATIVE JOINT DISEASE (ICD-715.90) Assessment Improved  Her updated  medication list for this problem includes:    Tylenol Extra Strength 500 Mg Tabs (Acetaminophen) ..... Otc as directed.    Tramadol Hcl 50 Mg Tabs (Tramadol hcl) ..... One by mouth qid as needed for pain  Problem # 2:  SHOULDER PAIN (ICD-719.41) Assessment: Unchanged  Her updated medication list for this problem includes:    Zanaflex 4 Mg Tabs (Tizanidine hcl) .Marland Kitchen... Take one four times a day    Tylenol Extra Strength 500 Mg Tabs (Acetaminophen) ..... Otc as directed.    Tramadol Hcl 50 Mg Tabs (Tramadol hcl) ..... One by mouth qid as needed for pain  Orders: T-Shoulder Left Min 2 Views (73030TC)  Discussed shoulder exercises, use of moist heat or ice, and medication.   Problem # 3:  HYPERTENSION (ICD-401.9) Assessment: Improved  Her updated medication list  for this problem includes:    Clonidine Hcl 0.1 Mg Tabs (Clonidine hcl) .Marland Kitchen... Take one by mouth daily    Hydrochlorothiazide 25 Mg Tabs (Hydrochlorothiazide) .Marland Kitchen... Take 1 tablet by mouth once a day    Altace 5 Mg Caps (Ramipril) .Marland Kitchen... Take 1 capsule by mouth once a day  BP today: 132/80 Prior BP: 102/54 (04/13/2009)  10 Yr Risk Heart Disease: 8 % Prior 10 Yr Risk Heart Disease: 5 % (04/13/2009)  Labs Reviewed: K+: 4.9 (04/04/2009) Creat: : 0.7 (04/04/2009)   Chol: 121 (04/04/2009)   HDL: 46.00 (04/04/2009)   LDL: 58 (04/04/2009)   TG: 86.0 (04/04/2009)  Problem # 4:  HYPOTHYROIDISM (ICD-244.9) Assessment: Unchanged  Labs Reviewed: TSH: 3.76 (04/04/2009)    HgBA1c: 5.4 (04/04/2009) Chol: 121 (04/04/2009)   HDL: 46.00 (04/04/2009)   LDL: 58 (04/04/2009)   TG: 86.0 (04/04/2009)  Complete Medication List: 1)  Clonidine Hcl 0.1 Mg Tabs (Clonidine hcl) .... Take one by mouth daily 2)  Evista 60 Mg Tabs (Raloxifene hcl) .... Take one by mouth daily 3)  Centrum Tabs (Multiple vitamins-minerals) .... Take one by mouth daily 4)  Vitamin D 1000 Unit Tabs (Cholecalciferol) .... Take one by mouth daily 5)  Sublingual B12 (over The Counter)  .Marland Kitchen.. 1 by mouth qd 6)  Probiotic- For Colon Over The Counter  7)  Potassium Chloride Cr 10 Meq Tbcr (Potassium chloride) .... Take one by mouth daily 8)  Astelin 137 Mcg/spray Soln (Azelastine hcl) .... 2 sprays per nostril once daily as needed 9)  Fish Oil 500 Mg Caps (Omega-3 fatty acids) .... Take two by mouth daily 10)  Calcium 500 Mg Tabs (Calcium) .... Take two by mouth twice a day 11)  Zanaflex 4 Mg Tabs (Tizanidine hcl) .... Take one four times a day 12)  Claritin 10 Mg Tabs (Loratadine) .... Otc as directed. 13)  Protonix 40 Mg Tbec (Pantoprazole sodium) .... Take 1 tablet by mouth once a day 14)  Hydrochlorothiazide 25 Mg Tabs (Hydrochlorothiazide) .... Take 1 tablet by mouth once a day 15)  Altace 5 Mg Caps (Ramipril) .... Take 1 capsule by  mouth once a day 16)  Zocor 40 Mg Tabs (Simvastatin) .... Take one at bedtime 17)  Neurontin 300 Mg Caps (Gabapentin) .... Take one three times a day 18)  Tylenol Extra Strength 500 Mg Tabs (Acetaminophen) .... Otc as directed. 19)  Xanax 0.5 Mg Tabs (Alprazolam) .... Take one by mouth at bedtime as needed anxiety 20)  Celexa 10 Mg Tabs (Citalopram hydrobromide) .... Take one tablet in am 21)  Plavix 75 Mg Tabs (Clopidogrel bisulfate) .... Take 1 tablet by mouth once a day 22)  Tramadol Hcl 50 Mg Tabs (Tramadol hcl) .... One by mouth qid as needed for pain  Hypertension Assessment/Plan:      The patient's hypertensive risk group is category C: Target organ damage and/or diabetes.  Her calculated 10 year risk of coronary heart disease is 8 %.  Today's blood pressure is 132/80.  Her blood pressure goal is < 140/90.

## 2010-02-07 ENCOUNTER — Encounter: Payer: Self-pay | Admitting: Internal Medicine

## 2010-02-08 NOTE — Letter (Signed)
Summary: Denied CMN/DynaSplint  Denied CMN/D[facility]Splint   Imported By: Lester Greenfield 01/29/2010 07:36:35  _____________________________________________________________________  External Attachment:    Type:   Image     Comment:   External Document

## 2010-02-08 NOTE — Progress Notes (Signed)
Summary: Omeprazole refill  Phone Note Refill Request Message from:  Scriptline on January 30, 2010 10:31 AM  Received refill request for Omeprazole 20mg  1 by mouth once daily from [facility] on [address]. It is not on med list. She apparently stopped it in March of 2011. Okay to fill?   Method Requested: Electronic Initial call taken by: Janee Morn CMA Duncan Dull),  January 30, 2010 10:30 AM  Follow-up for Phone Call        please call and ask her about this  Follow-up by: Judith Part MD,  January 30, 2010 11:34 AM  Additional Follow-up for Phone Call Additional follow up Details #1::        Left message for patient to call back. Lewanda Rife LPN  January 30, 2010 1:47 PM   Spoke with pt. Pt has changed to Dr L at [facility] because it is closer to pt's home. [facility] [employer]. notified.Lewanda Rife LPN  February 01, 2010 9:12 AM

## 2010-03-08 ENCOUNTER — Encounter: Payer: Self-pay | Admitting: Internal Medicine

## 2010-03-25 LAB — BASIC METABOLIC PANEL
BUN: 12 mg/dL (ref 6–23)
CO2: 27 mEq/L (ref 19–32)
Chloride: 98 mEq/L (ref 96–112)
Chloride: 99 mEq/L (ref 96–112)
Creatinine, Ser: 0.69 mg/dL (ref 0.4–1.2)
GFR calc Af Amer: >60 mL/min (ref >60)
GFR calc non Af Amer: >60 mL/min (ref >60)
Potassium: 3.6 mEq/L (ref 3.5–5.1)
Potassium: 4.2 mEq/L (ref 3.5–5.1)
Sodium: 133 mEq/L — ABNORMAL LOW (ref 135–145)

## 2010-03-25 LAB — GLUCOSE, CAPILLARY
Glucose-Capillary: 102 mg/dL — ABNORMAL HIGH (ref 70–99)
Glucose-Capillary: 106 mg/dL — ABNORMAL HIGH (ref 70–99)
Glucose-Capillary: 109 mg/dL — ABNORMAL HIGH (ref 70–99)
Glucose-Capillary: 109 mg/dL — ABNORMAL HIGH (ref 70–99)
Glucose-Capillary: 111 mg/dL — ABNORMAL HIGH (ref 70–99)
Glucose-Capillary: 113 mg/dL — ABNORMAL HIGH (ref 70–99)
Glucose-Capillary: 117 mg/dL — ABNORMAL HIGH (ref 70–99)
Glucose-Capillary: 124 mg/dL — ABNORMAL HIGH (ref 70–99)
Glucose-Capillary: 129 mg/dL — ABNORMAL HIGH (ref 70–99)
Glucose-Capillary: 15 mg/dL — CL (ref 70–99)
Glucose-Capillary: 99 mg/dL (ref 70–99)

## 2010-03-25 LAB — COMPREHENSIVE METABOLIC PANEL
ALT: 15 U/L (ref 0–35)
Calcium: 9.7 mg/dL (ref 8.4–10.5)
Creatinine, Ser: 0.77 mg/dL (ref 0.4–1.2)
GFR calc Af Amer: >60 mL/min (ref >60)
Glucose, Bld: 110 mg/dL — ABNORMAL HIGH (ref 70–99)
Sodium: 130 mEq/L — ABNORMAL LOW (ref 135–145)
Total Protein: 6 g/dL (ref 6.0–8.3)

## 2010-03-26 LAB — GLUCOSE, CAPILLARY: Glucose-Capillary: 104 mg/dL — ABNORMAL HIGH (ref 70–99)

## 2010-03-27 LAB — BASIC METABOLIC PANEL
BUN: 20 mg/dL (ref 6–23)
BUN: 10 mg/dL (ref 6–23)
BUN: 6 mg/dL (ref 6–23)
CO2: 28 mEq/L (ref 19–32)
CO2: 30 mEq/L (ref 19–32)
Calcium: 10 mg/dL (ref 8.4–10.5)
Calcium: 10.1 mg/dL (ref 8.4–10.5)
Chloride: 105 mEq/L (ref 96–112)
Chloride: 101 mEq/L (ref 96–112)
Chloride: 103 mEq/L (ref 96–112)
Chloride: 104 mEq/L (ref 96–112)
Chloride: 104 mEq/L (ref 96–112)
Creatinine, Ser: 0.55 mg/dL (ref 0.4–1.2)
Creatinine, Ser: 0.64 mg/dL (ref 0.4–1.2)
GFR calc Af Amer: >60 mL/min (ref >60)
GFR calc Af Amer: >60 mL/min (ref >60)
GFR calc Af Amer: >60 mL/min (ref >60)
GFR calc Af Amer: >60 mL/min (ref >60)
GFR calc non Af Amer: 60 mL/min — ABNORMAL LOW (ref >60)
GFR calc non Af Amer: >60 mL/min (ref >60)
GFR calc non Af Amer: >60 mL/min (ref >60)
Glucose, Bld: 99 mg/dL (ref 70–99)
Potassium: 3.7 mEq/L (ref 3.5–5.1)
Potassium: 2.9 mEq/L — ABNORMAL LOW (ref 3.5–5.1)
Potassium: 3.8 mEq/L (ref 3.5–5.1)
Potassium: 3.8 mEq/L (ref 3.5–5.1)
Sodium: 139 mEq/L (ref 135–145)
Sodium: 141 mEq/L (ref 135–145)

## 2010-03-27 LAB — LIPID PANEL
HDL: 36 mg/dL — ABNORMAL LOW (ref >39)
LDL Cholesterol: 85 mg/dL (ref 0–99)
Total CHOL/HDL Ratio: 3.9 RATIO
Triglycerides: 97 mg/dL (ref <150)
VLDL: 19 mg/dL (ref 0–40)

## 2010-03-27 LAB — URINALYSIS, ROUTINE W REFLEX MICROSCOPIC
Glucose, UA: NEGATIVE mg/dL
Ketones, ur: >80 mg/dL — AB
Leukocytes, UA: NEGATIVE
Nitrite: NEGATIVE
Protein, ur: NEGATIVE mg/dL
pH: 6 (ref 5.0–8.0)

## 2010-03-27 LAB — CBC
Hemoglobin: 13.7 g/dL (ref 12.0–15.0)
MCV: 90 fL (ref 78.0–100.0)
Platelets: 228 10*3/uL (ref 150–400)
RBC: 4.31 MIL/uL (ref 3.87–5.11)
RBC: 4.43 MIL/uL (ref 3.87–5.11)
WBC: 4.8 10*3/uL (ref 4.0–10.5)
WBC: 6 10*3/uL (ref 4.0–10.5)

## 2010-03-27 LAB — POCT I-STAT, CHEM 8
BUN: 9 mg/dL (ref 6–23)
Calcium, Ion: 1.14 mmol/L (ref 1.12–1.32)
Chloride: 98 mEq/L (ref 96–112)
Glucose, Bld: 99 mg/dL (ref 70–99)
Potassium: 3.1 mEq/L — ABNORMAL LOW (ref 3.5–5.1)

## 2010-03-27 LAB — URINE CULTURE
Colony Count: NO GROWTH
Culture: NO GROWTH

## 2010-03-27 LAB — DIFFERENTIAL
Lymphs Abs: 1.1 10*3/uL (ref 0.7–4.0)
Monocytes Absolute: 0.4 10*3/uL (ref 0.1–1.0)
Monocytes Relative: 8 % (ref 3–12)
Neutro Abs: 3.3 10*3/uL (ref 1.7–7.7)
Neutrophils Relative %: 68 % (ref 43–77)

## 2010-03-27 LAB — GLUCOSE, CAPILLARY: Glucose-Capillary: 117 mg/dL — ABNORMAL HIGH (ref 70–99)

## 2010-03-27 LAB — URINE MICROSCOPIC-ADD ON

## 2010-03-27 LAB — PROTIME-INR: INR: 1.01 (ref 0.00–1.49)

## 2010-04-08 ENCOUNTER — Encounter: Payer: Self-pay | Admitting: Internal Medicine

## 2010-04-09 LAB — GLUCOSE, CAPILLARY
Glucose-Capillary: 108 mg/dL — ABNORMAL HIGH (ref 70–99)
Glucose-Capillary: 108 mg/dL — ABNORMAL HIGH (ref 70–99)
Glucose-Capillary: 109 mg/dL — ABNORMAL HIGH (ref 70–99)
Glucose-Capillary: 112 mg/dL — ABNORMAL HIGH (ref 70–99)
Glucose-Capillary: 115 mg/dL — ABNORMAL HIGH (ref 70–99)
Glucose-Capillary: 124 mg/dL — ABNORMAL HIGH (ref 70–99)
Glucose-Capillary: 124 mg/dL — ABNORMAL HIGH (ref 70–99)
Glucose-Capillary: 90 mg/dL (ref 70–99)
Glucose-Capillary: 91 mg/dL (ref 70–99)

## 2010-04-09 LAB — DIFFERENTIAL
Basophils Relative: 0 % (ref 0–1)
Eosinophils Absolute: 0.2 10*3/uL (ref 0.0–0.7)
Eosinophils Relative: 2 % (ref 0–5)
Lymphocytes Relative: 25 % (ref 12–46)
Lymphs Abs: 1.6 10*3/uL (ref 0.7–4.0)
Monocytes Relative: 7 % (ref 3–12)
Neutrophils Relative %: 69 % (ref 43–77)

## 2010-04-09 LAB — BASIC METABOLIC PANEL
BUN: 14 mg/dL (ref 6–23)
BUN: 9 mg/dL (ref 6–23)
CO2: 29 mEq/L (ref 19–32)
Calcium: 9.1 mg/dL (ref 8.4–10.5)
Chloride: 101 mEq/L (ref 96–112)
Chloride: 96 mEq/L (ref 96–112)
Creatinine, Ser: 0.57 mg/dL (ref 0.4–1.2)
GFR calc Af Amer: >60 mL/min (ref >60)
Glucose, Bld: 112 mg/dL — ABNORMAL HIGH (ref 70–99)
Potassium: 3.3 mEq/L — ABNORMAL LOW (ref 3.5–5.1)
Potassium: 3.5 mEq/L (ref 3.5–5.1)
Sodium: 130 mEq/L — ABNORMAL LOW (ref 135–145)
Sodium: 139 mEq/L (ref 135–145)

## 2010-04-09 LAB — LIPID PANEL
LDL Cholesterol: 173 mg/dL — ABNORMAL HIGH (ref 0–99)
Total CHOL/HDL Ratio: 5.8 RATIO
Triglycerides: 116 mg/dL (ref <150)
VLDL: 23 mg/dL (ref 0–40)

## 2010-04-09 LAB — URINALYSIS, ROUTINE W REFLEX MICROSCOPIC
Glucose, UA: NEGATIVE mg/dL
Protein, ur: NEGATIVE mg/dL
Specific Gravity, Urine: 1.008 (ref 1.005–1.030)
Urobilinogen, UA: 0.2 mg/dL (ref 0.0–1.0)

## 2010-04-09 LAB — COMPREHENSIVE METABOLIC PANEL
ALT: 22 U/L (ref 0–35)
ALT: 27 U/L (ref 0–35)
AST: 31 U/L (ref 0–37)
Alkaline Phosphatase: 61 U/L (ref 39–117)
Alkaline Phosphatase: 62 U/L (ref 39–117)
CO2: 27 mEq/L (ref 19–32)
CO2: 29 mEq/L (ref 19–32)
Calcium: 9.2 mg/dL (ref 8.4–10.5)
Chloride: 103 mEq/L (ref 96–112)
GFR calc Af Amer: >60 mL/min (ref >60)
GFR calc non Af Amer: >60 mL/min (ref >60)
GFR calc non Af Amer: >60 mL/min (ref >60)
Glucose, Bld: 105 mg/dL — ABNORMAL HIGH (ref 70–99)
Potassium: 3.3 mEq/L — ABNORMAL LOW (ref 3.5–5.1)
Potassium: 3.5 mEq/L (ref 3.5–5.1)
Sodium: 129 mEq/L — ABNORMAL LOW (ref 135–145)
Sodium: 139 mEq/L (ref 135–145)

## 2010-04-09 LAB — URINE CULTURE: Colony Count: 65000

## 2010-04-09 LAB — CBC
HCT: 39.7 % (ref 36.0–46.0)
Hemoglobin: 13 g/dL (ref 12.0–15.0)
MCV: 87.7 fL (ref 78.0–100.0)
Platelets: 244 10*3/uL (ref 150–400)
RBC: 4.3 MIL/uL (ref 3.87–5.11)
RBC: 4.53 MIL/uL (ref 3.87–5.11)
WBC: 6.7 10*3/uL (ref 4.0–10.5)

## 2010-04-09 LAB — POCT I-STAT, CHEM 8
Calcium, Ion: 1.2 mmol/L (ref 1.12–1.32)
Creatinine, Ser: 0.6 mg/dL (ref 0.4–1.2)
Glucose, Bld: 107 mg/dL — ABNORMAL HIGH (ref 70–99)
Hemoglobin: 14.3 g/dL (ref 12.0–15.0)
TCO2: 29 mmol/L (ref 0–100)

## 2010-04-09 LAB — RAPID URINE DRUG SCREEN, HOSP PERFORMED
Amphetamines: NOT DETECTED
Barbiturates: NOT DETECTED
Benzodiazepines: NOT DETECTED
Cocaine: NOT DETECTED
Opiates: NOT DETECTED

## 2010-04-09 LAB — HEMOGLOBIN A1C: Hgb A1c MFr Bld: 6.2 % — ABNORMAL HIGH (ref 4.6–6.1)

## 2010-04-09 LAB — POCT CARDIAC MARKERS: Myoglobin, poc: 94.2 ng/mL (ref 12–200)

## 2010-04-09 LAB — URINE MICROSCOPIC-ADD ON

## 2010-04-09 LAB — PROTIME-INR
INR: 0.93 (ref 0.00–1.49)
Prothrombin Time: 12.4 seconds (ref 11.6–15.2)

## 2010-05-08 ENCOUNTER — Encounter: Payer: Self-pay | Admitting: Internal Medicine

## 2010-05-12 ENCOUNTER — Emergency Department (HOSPITAL_COMMUNITY)
Admission: EM | Admit: 2010-05-12 | Discharge: 2010-05-12 | Disposition: A | Discharge status: Home / Self Care | Payer: Medicare Other | Attending: Emergency Medicine | Admitting: Emergency Medicine

## 2010-05-12 DIAGNOSIS — Z8673 Personal history of transient ischemic attack (TIA), and cerebral infarction without residual deficits: Secondary | ICD-10-CM | POA: Insufficient documentation

## 2010-05-12 DIAGNOSIS — I10 Essential (primary) hypertension: Secondary | ICD-10-CM | POA: Insufficient documentation

## 2010-05-12 DIAGNOSIS — G8190 Hemiplegia, unspecified affecting unspecified side: Secondary | ICD-10-CM | POA: Insufficient documentation

## 2010-05-12 DIAGNOSIS — M216X9 Other acquired deformities of unspecified foot: Secondary | ICD-10-CM | POA: Insufficient documentation

## 2010-05-12 DIAGNOSIS — M79609 Pain in unspecified limb: Secondary | ICD-10-CM | POA: Insufficient documentation

## 2010-05-12 DIAGNOSIS — R471 Dysarthria and anarthria: Secondary | ICD-10-CM | POA: Insufficient documentation

## 2010-05-12 DIAGNOSIS — R29898 Other symptoms and signs involving the musculoskeletal system: Secondary | ICD-10-CM | POA: Insufficient documentation

## 2010-05-12 DIAGNOSIS — Z79899 Other long term (current) drug therapy: Secondary | ICD-10-CM | POA: Insufficient documentation

## 2010-05-15 ENCOUNTER — Emergency Department (HOSPITAL_COMMUNITY)
Admission: EM | Admit: 2010-05-15 | Discharge: 2010-05-16 | Disposition: A | Discharge status: Home / Self Care | Payer: Medicare Other | Attending: Emergency Medicine | Admitting: Emergency Medicine

## 2010-05-15 DIAGNOSIS — Z8673 Personal history of transient ischemic attack (TIA), and cerebral infarction without residual deficits: Secondary | ICD-10-CM | POA: Insufficient documentation

## 2010-05-15 DIAGNOSIS — I10 Essential (primary) hypertension: Secondary | ICD-10-CM | POA: Insufficient documentation

## 2010-10-01 ENCOUNTER — Other Ambulatory Visit: Payer: Self-pay | Admitting: Internal Medicine

## 2010-10-03 ENCOUNTER — Other Ambulatory Visit: Payer: Self-pay | Admitting: Internal Medicine

## 2010-12-26 ENCOUNTER — Other Ambulatory Visit: Payer: Self-pay | Admitting: Internal Medicine

## 2011-11-11 ENCOUNTER — Ambulatory Visit: Payer: Self-pay | Admitting: Family Medicine

## 2013-03-25 ENCOUNTER — Inpatient Hospital Stay: Payer: Self-pay | Admitting: Family Medicine

## 2013-03-25 LAB — CBC WITH DIFFERENTIAL/PLATELET
BASOS PCT: 0.5 %
Basophil #: 0 10*3/uL (ref 0.0–0.1)
Eosinophil #: 0.1 10*3/uL (ref 0.0–0.7)
Eosinophil %: 2.3 %
HCT: 35.6 % (ref 35.0–47.0)
HGB: 12.4 g/dL (ref 12.0–16.0)
LYMPHS ABS: 1.7 10*3/uL (ref 1.0–3.6)
Lymphocyte %: 28.2 %
MCH: 32.6 pg (ref 26.0–34.0)
MCHC: 34.8 g/dL (ref 32.0–36.0)
MCV: 94 fL (ref 80–100)
MONO ABS: 0.4 x10 3/mm (ref 0.2–0.9)
Monocyte %: 7.5 %
NEUTROS PCT: 61.5 %
Neutrophil #: 3.6 10*3/uL (ref 1.4–6.5)
PLATELETS: 220 10*3/uL (ref 150–440)
RBC: 3.81 10*6/uL (ref 3.80–5.20)
RDW: 12 % (ref 11.5–14.5)
WBC: 5.9 10*3/uL (ref 3.6–11.0)

## 2013-03-25 LAB — HEPATIC FUNCTION PANEL A (ARMC)
ALK PHOS: 66 U/L
ALT: 24 U/L (ref 12–78)
AST: 27 U/L (ref 15–37)
Albumin: 4.3 g/dL (ref 3.4–5.0)
BILIRUBIN TOTAL: 0.5 mg/dL (ref 0.2–1.0)
Bilirubin, Direct: <0.1 mg/dL (ref 0.00–0.20)
TOTAL PROTEIN: 7.8 g/dL (ref 6.4–8.2)

## 2013-03-25 LAB — URINALYSIS, COMPLETE
BACTERIA: NONE SEEN
Bilirubin,UR: NEGATIVE
Glucose,UR: NEGATIVE mg/dL (ref 0–75)
KETONE: NEGATIVE
Leukocyte Esterase: NEGATIVE
NITRITE: NEGATIVE
PH: 7 (ref 4.5–8.0)
PROTEIN: NEGATIVE
SPECIFIC GRAVITY: 1.012 (ref 1.003–1.030)
Squamous Epithelial: 1

## 2013-03-25 LAB — BASIC METABOLIC PANEL
ANION GAP: 3 — AB (ref 7–16)
BUN: 10 mg/dL (ref 7–18)
CALCIUM: 9.6 mg/dL (ref 8.5–10.1)
CHLORIDE: 95 mmol/L — AB (ref 98–107)
CO2: 30 mmol/L (ref 21–32)
CREATININE: 0.87 mg/dL (ref 0.60–1.30)
EGFR (Non-African Amer.): >60
GLUCOSE: 84 mg/dL (ref 65–99)
Osmolality: 255 (ref 275–301)
POTASSIUM: 4 mmol/L (ref 3.5–5.1)
SODIUM: 128 mmol/L — AB (ref 136–145)

## 2013-03-25 LAB — PROTIME-INR
INR: 0.9
Prothrombin Time: 12.2 secs (ref 11.5–14.7)

## 2013-03-25 LAB — TROPONIN I

## 2013-03-26 LAB — CBC WITH DIFFERENTIAL/PLATELET
BASOS ABS: 0 10*3/uL (ref 0.0–0.1)
Basophil %: 0.5 %
EOS ABS: 0.2 10*3/uL (ref 0.0–0.7)
Eosinophil %: 2.2 %
HCT: 36.8 % (ref 35.0–47.0)
HGB: 12.9 g/dL (ref 12.0–16.0)
Lymphocyte #: 1.6 10*3/uL (ref 1.0–3.6)
Lymphocyte %: 21.8 %
MCH: 32.4 pg (ref 26.0–34.0)
MCHC: 34.9 g/dL (ref 32.0–36.0)
MCV: 93 fL (ref 80–100)
MONO ABS: 0.5 x10 3/mm (ref 0.2–0.9)
MONOS PCT: 6.9 %
Neutrophil #: 4.9 10*3/uL (ref 1.4–6.5)
Neutrophil %: 68.6 %
Platelet: 210 10*3/uL (ref 150–440)
RBC: 3.96 10*6/uL (ref 3.80–5.20)
RDW: 11.9 % (ref 11.5–14.5)
WBC: 7.2 10*3/uL (ref 3.6–11.0)

## 2013-03-26 LAB — BASIC METABOLIC PANEL
Anion Gap: 5 — ABNORMAL LOW (ref 7–16)
BUN: 10 mg/dL (ref 7–18)
CHLORIDE: 101 mmol/L (ref 98–107)
Calcium, Total: 9.6 mg/dL (ref 8.5–10.1)
Co2: 26 mmol/L (ref 21–32)
Creatinine: 0.7 mg/dL (ref 0.60–1.30)
EGFR (Non-African Amer.): >60
GLUCOSE: 88 mg/dL (ref 65–99)
OSMOLALITY: 263 (ref 275–301)
POTASSIUM: 3.5 mmol/L (ref 3.5–5.1)
Sodium: 132 mmol/L — ABNORMAL LOW (ref 136–145)

## 2013-03-26 LAB — LIPID PANEL
CHOLESTEROL: 153 mg/dL (ref 0–200)
HDL: 45 mg/dL (ref 40–60)
LDL CHOLESTEROL, CALC: 79 mg/dL (ref 0–100)
TRIGLYCERIDES: 146 mg/dL (ref 0–200)
VLDL CHOLESTEROL, CALC: 29 mg/dL (ref 5–40)

## 2013-03-26 LAB — OSMOLALITY, URINE: Osmolality: 342 mOsm/kg

## 2013-03-26 LAB — MAGNESIUM: MAGNESIUM: 1.7 mg/dL — AB

## 2013-03-26 LAB — SODIUM, URINE, RANDOM: Sodium, Urine Random: 101 mmol/L (ref 20–110)

## 2013-03-27 LAB — BASIC METABOLIC PANEL
Anion Gap: 3 — ABNORMAL LOW (ref 7–16)
BUN: 12 mg/dL (ref 7–18)
CALCIUM: 9.9 mg/dL (ref 8.5–10.1)
Chloride: 95 mmol/L — ABNORMAL LOW (ref 98–107)
Co2: 30 mmol/L (ref 21–32)
Creatinine: 0.87 mg/dL (ref 0.60–1.30)
EGFR (Non-African Amer.): >60
GLUCOSE: 97 mg/dL (ref 65–99)
Osmolality: 257 (ref 275–301)
Potassium: 4.1 mmol/L (ref 3.5–5.1)
Sodium: 128 mmol/L — ABNORMAL LOW (ref 136–145)

## 2013-03-28 LAB — CBC WITH DIFFERENTIAL/PLATELET
BASOS ABS: 0 10*3/uL (ref 0.0–0.1)
Basophil %: 0.2 %
Eosinophil #: 0.1 10*3/uL (ref 0.0–0.7)
Eosinophil %: 1.4 %
HCT: 38.7 % (ref 35.0–47.0)
HGB: 13.5 g/dL (ref 12.0–16.0)
Lymphocyte #: 2 10*3/uL (ref 1.0–3.6)
Lymphocyte %: 22.8 %
MCH: 32.1 pg (ref 26.0–34.0)
MCHC: 34.8 g/dL (ref 32.0–36.0)
MCV: 92 fL (ref 80–100)
MONOS PCT: 9.5 %
Monocyte #: 0.8 x10 3/mm (ref 0.2–0.9)
NEUTROS PCT: 66.1 %
Neutrophil #: 5.8 10*3/uL (ref 1.4–6.5)
PLATELETS: 236 10*3/uL (ref 150–440)
RBC: 4.19 10*6/uL (ref 3.80–5.20)
RDW: 12.1 % (ref 11.5–14.5)
WBC: 8.8 10*3/uL (ref 3.6–11.0)

## 2013-03-28 LAB — BASIC METABOLIC PANEL
ANION GAP: 3 — AB (ref 7–16)
BUN: 13 mg/dL (ref 7–18)
CALCIUM: 10.3 mg/dL — AB (ref 8.5–10.1)
CO2: 33 mmol/L — AB (ref 21–32)
CREATININE: 0.79 mg/dL (ref 0.60–1.30)
Chloride: 91 mmol/L — ABNORMAL LOW (ref 98–107)
EGFR (Non-African Amer.): >60
Glucose: 82 mg/dL (ref 65–99)
OSMOLALITY: 254 (ref 275–301)
Potassium: 3.9 mmol/L (ref 3.5–5.1)
Sodium: 127 mmol/L — ABNORMAL LOW (ref 136–145)

## 2013-03-29 LAB — BASIC METABOLIC PANEL
Anion Gap: 6 — ABNORMAL LOW (ref 7–16)
BUN: 15 mg/dL (ref 7–18)
CREATININE: 0.64 mg/dL (ref 0.60–1.30)
Calcium, Total: 10 mg/dL (ref 8.5–10.1)
Chloride: 94 mmol/L — ABNORMAL LOW (ref 98–107)
Co2: 27 mmol/L (ref 21–32)
EGFR (African American): >60
EGFR (Non-African Amer.): >60
Glucose: 82 mg/dL (ref 65–99)
OSMOLALITY: 255 (ref 275–301)
POTASSIUM: 4 mmol/L (ref 3.5–5.1)
Sodium: 127 mmol/L — ABNORMAL LOW (ref 136–145)

## 2013-03-29 LAB — CBC WITH DIFFERENTIAL/PLATELET
BASOS ABS: 0 10*3/uL (ref 0.0–0.1)
Basophil %: 0.5 %
Eosinophil #: 0.1 10*3/uL (ref 0.0–0.7)
Eosinophil %: 1.4 %
HCT: 39 % (ref 35.0–47.0)
HGB: 13.5 g/dL (ref 12.0–16.0)
LYMPHS PCT: 29 %
Lymphocyte #: 2.5 10*3/uL (ref 1.0–3.6)
MCH: 32.4 pg (ref 26.0–34.0)
MCHC: 34.5 g/dL (ref 32.0–36.0)
MCV: 94 fL (ref 80–100)
Monocyte #: 0.9 x10 3/mm (ref 0.2–0.9)
Monocyte %: 10.9 %
Neutrophil #: 5 10*3/uL (ref 1.4–6.5)
Neutrophil %: 58.2 %
Platelet: 227 10*3/uL (ref 150–440)
RBC: 4.16 10*6/uL (ref 3.80–5.20)
RDW: 11.9 % (ref 11.5–14.5)
WBC: 8.5 10*3/uL (ref 3.6–11.0)

## 2013-03-30 ENCOUNTER — Encounter: Payer: Self-pay | Admitting: Internal Medicine

## 2013-03-30 LAB — BASIC METABOLIC PANEL
Anion Gap: 7 (ref 7–16)
BUN: 30 mg/dL — ABNORMAL HIGH (ref 7–18)
CALCIUM: 9.5 mg/dL (ref 8.5–10.1)
CO2: 29 mmol/L (ref 21–32)
CREATININE: 1.44 mg/dL — AB (ref 0.60–1.30)
Chloride: 93 mmol/L — ABNORMAL LOW (ref 98–107)
EGFR (African American): 42 — ABNORMAL LOW
EGFR (Non-African Amer.): 36 — ABNORMAL LOW
GLUCOSE: 95 mg/dL (ref 65–99)
Osmolality: 265 (ref 275–301)
Potassium: 3.3 mmol/L — ABNORMAL LOW (ref 3.5–5.1)
Sodium: 129 mmol/L — ABNORMAL LOW (ref 136–145)

## 2013-03-31 LAB — BASIC METABOLIC PANEL
Anion Gap: 6 — ABNORMAL LOW (ref 7–16)
BUN: 28 mg/dL — AB (ref 7–18)
CREATININE: 1.16 mg/dL (ref 0.60–1.30)
Calcium, Total: 9.7 mg/dL (ref 8.5–10.1)
Chloride: 95 mmol/L — ABNORMAL LOW (ref 98–107)
Co2: 31 mmol/L (ref 21–32)
EGFR (African American): 54 — ABNORMAL LOW
GFR CALC NON AF AMER: 47 — AB
Glucose: 96 mg/dL (ref 65–99)
Osmolality: 270 (ref 275–301)
POTASSIUM: 3.3 mmol/L — AB (ref 3.5–5.1)
SODIUM: 132 mmol/L — AB (ref 136–145)

## 2013-04-06 LAB — BASIC METABOLIC PANEL
Anion Gap: 2 — ABNORMAL LOW (ref 7–16)
BUN: 11 mg/dL (ref 7–18)
CALCIUM: 9.4 mg/dL (ref 8.5–10.1)
CHLORIDE: 103 mmol/L (ref 98–107)
Co2: 32 mmol/L (ref 21–32)
Creatinine: 0.88 mg/dL (ref 0.60–1.30)
EGFR (African American): >60
GLUCOSE: 74 mg/dL (ref 65–99)
OSMOLALITY: 272 (ref 275–301)
Potassium: 4.6 mmol/L (ref 3.5–5.1)
Sodium: 137 mmol/L (ref 136–145)

## 2013-04-07 ENCOUNTER — Encounter: Payer: Self-pay | Admitting: Internal Medicine

## 2013-04-11 LAB — URINALYSIS, COMPLETE
BACTERIA: NONE SEEN
BILIRUBIN, UR: NEGATIVE
Blood: NEGATIVE
Glucose,UR: NEGATIVE mg/dL (ref 0–75)
Hyaline Cast: 16
Ketone: NEGATIVE
Leukocyte Esterase: NEGATIVE
Nitrite: NEGATIVE
PH: 5 (ref 4.5–8.0)
Protein: NEGATIVE
RBC,UR: 2 /HPF (ref 0–5)
Specific Gravity: 1.014 (ref 1.003–1.030)
Squamous Epithelial: <1
WBC UR: 2 /HPF (ref 0–5)

## 2013-04-13 LAB — URINE CULTURE

## 2013-05-07 ENCOUNTER — Encounter: Payer: Self-pay | Admitting: Internal Medicine

## 2013-10-22 DIAGNOSIS — E7800 Pure hypercholesterolemia, unspecified: Secondary | ICD-10-CM | POA: Insufficient documentation

## 2014-03-03 ENCOUNTER — Ambulatory Visit: Payer: Self-pay | Admitting: Family Medicine

## 2014-03-10 ENCOUNTER — Emergency Department: Payer: Self-pay | Admitting: Emergency Medicine

## 2014-04-30 NOTE — Discharge Summary (Signed)
PATIENT NAMEASHNI, [name] MR#:  [id number] DATE OF BIRTH:  [birth year]  DATE OF ADMISSION:  03/25/2013 DATE OF DISCHARGE: 03/31/2013  DISCHARGE DIAGNOSES:  1.  History of cerebrovascular accident with progressive weakness of left upper extremity with hypotonicity.  2.  Generalized weakness.  3.  Hypertension. 4.  Depression and anxiety.  5.  Hyperlipidemia.  6.  Hyponatremia.   DISCHARGE MEDICATIONS:  1.  Aggrenox 25/200, 1 capsule p.o. b.i.d.  2.  Citalopram 20 mg p.o. daily.  3.  Clonidine 0.1 mg p.o. b.i.d.  4.  Gabapentin 300 mg 1 capsule p.o. t.i.d.  5.  Lovastatin 20 mg p.o. at bedtime.  6.  Omeprazole 20 mg p.o. daily.  7.  Tramadol 50 mg p.o. t.i.d. p.r.n. for pain.  8.  Tylenol Extra Strength 500 mg 2 tabs p.o. q.8 hours.  9.  Zanaflex 4 mg 1 capsule p.o. q.6 hours.  10. Losartan 50 mg p.o. daily.   MEDICATIONS TO HOLD: Hydrochlorothiazide.   CONSULTS: Neurology.   PROCEDURES: None.   PERTINENT LABS AND STUDIES: MRI did not show any acute infarct or hemorrhage. It did show chronic ischemia that may be progressive. Sodium 132, potassium 3.3, creatinine 1.16.   BRIEF HOSPITAL COURSE:  1.  History of CVA. The patient initially came in with progressive worsening weakness of the left upper extremity. CT of the head was negative. MRI showed no acute infarct. It is thought that this was likely chronic progressive worsening of her previous ischemia. Plan is to continue with her Aggrenox at this time. We will continue to control major risk factors.  2.  Hyponatremia. The patient was noted to have a low sodium as low as 127. Hydrochlorothiazide was held. Free water obstruction was implemented. Her sodium has come up to 132 on day of discharge.  3.  Hypertension. The patient did have elevated blood pressure. It improved after restarting the losartan.  4.  Generalized weakness. The patient was noted to have significant generalized weakness requiring physical therapy. Plan is to  send her to a skilled nursing facility for a period of time for rehab.   DISPOSITION: She is in stable condition and will be discharged to rehab. Will follow up with Dr. Netty Starring upon discharge and the referring rehab.  ____________________________ Margaret Body, MD kl:[id number] D: 03/31/2013 07:52:52 ET T: 03/31/2013 08:26:27 ET JOB#: [id number]  cc: Margaret Body, MD, <Dictator> Margaret Body MD ELECTRONICALLY SIGNED 04/12/2013 10:35

## 2014-04-30 NOTE — Consult Note (Signed)
Referring Physician:  Doreatha Massed :   Reason for Consult: Admit Date: 25-Mar-2013  Chief Complaint: L sided weakness  Reason for Consult: CVA   History of Present Illness: History of Present Illness:   74 yo RHD F presents with worsening L sided weakness.  Pt has baseline L sided weakness from her previous stroke in 2010.  She had a TIA one month after this.  She is dependent on most ADL's at home and is not able to lift her L arm against gravity and is barely able to lift her L leg.  She denies facial droop or vision changes.  Today at 1255, pt states that she was weaker on the L than her normal baseline.    ROS:  General denies complaints   HEENT no complaints   Lungs no complaints   Cardiac no complaints   GI no complaints   GU no complaints   Musculoskeletal no complaints   Extremities no complaints   Skin no complaints   Neuro no complaints   Endocrine no complaints  sweats   Psych no complaints   Past Medical/Surgical Hx:  CVA x 2:   hot flashes:   Hypertension:   Past Medical/ Surgical Hx:  Past Medical History as above   Past Surgical History as above   Home Medications: Medication Instructions Last Modified Date/Time  Aggrenox 25 mg-200 mg oral capsule, extended release 1 cap(s) orally 2 times a day 19-Mar-15 15:30  citalopram 20 mg oral tablet 1 tab(s) orally once a day 19-Mar-15 15:30  cloNIDine 0.1 mg oral tablet 1 tab(s) orally 2 times a day 19-Mar-15 15:30  gabapentin 300 mg oral capsule 1 cap(s) orally 3 times a day 19-Mar-15 15:30  hydrochlorothiazide 25 mg oral tablet 1 tab(s) orally once a day 19-Mar-15 15:30  losartan 50 mg oral tablet 1 tab(s) orally 2 times a day 19-Mar-15 15:30  lovastatin 20 mg oral tablet 1 tab(s) orally once a day (at bedtime) 19-Mar-15 15:30  omeprazole 20 mg oral delayed release capsule 1 cap(s) orally once a day (in the morning) 19-Mar-15 15:30  tramadol 50 mg oral tablet 1 tab(s) orally 3 times a day, As  Needed - for Pain 19-Mar-15 15:30  Tylenol Extra Strength 500 mg oral tablet 2 tab(s) orally every 8 hours 19-Mar-15 15:30  Zanaflex 4 mg oral capsule 1 cap(s) orally 4 times a day 19-Mar-15 15:30   Allergies:  No Known Allergies:   Allergies:  Allergies NKDA   Social/Family History: Employment Status: retired  Lives With: significant other  Living Arrangements: house  Social History: no tob, no EtOH, no illicits  Family History: no stroke, + CAD   Physical Exam: General: slightly overweight, NAD  HEENT: normocephalic, sclera nonicteric, oropharynx clear  Neck: supple, no JVD, no bruits  Chest: CTA B, no wheezing, good movement  Cardiac: RRR, no murmurs, no edema, 2+ pulses  Extremities: no C/C/E, FROM   Neurologic Exam: Mental Status: alert and oriented x 3, normal  language, follows complex commands, moderate dysarthria  Cranial Nerves: PERRLA, EOMI, nl VF, L droop, tongue midline, shoulder shrug equal  Motor Exam: 5 /5 R, 1/5 L UE, 2/5 L LE, contractures on L  Deep Tendon Reflexes: more brisk on the L, babinski on L, down going plantar on R  Sensory Exam: intact to light touch B, no neglect noted  Coordination: F to N and HTS nl on R, untestable on L   Lab Results: Hepatic:  19-Mar-15 13:47   Bilirubin,  Total 0.5  Bilirubin, Direct < 0.1 (Result(s) reported on 25 Mar 2013 at 02:54PM.)  Alkaline Phosphatase 66 (45-117 NOTE: New Reference Range 11/27/12)  SGPT (ALT) 24  SGOT (AST) 27  Total Protein, Serum 7.8  Albumin, Serum 4.3  Routine Chem:  19-Mar-15 13:47   Glucose, Serum 84  BUN 10  Creatinine (comp) 0.87  Sodium, Serum  128  Potassium, Serum 4.0  Chloride, Serum  95  CO2, Serum 30  Calcium (Total), Serum 9.6  Anion Gap  3  Osmolality (calc) 255  eGFR (African American) >60  eGFR (Non-African American) >60 (eGFR values <49m/min/1.73 m2 may be an indication of chronic kidney disease (CKD). Calculated eGFR is useful in patients with stable renal  function. The eGFR calculation will not be reliable in acutely ill patients when serum creatinine is changing rapidly. It is not useful in  patients on dialysis. The eGFR calculation may not be applicable to patients at the low and high extremes of body sizes, pregnant women, and vegetarians.)  Cardiac:  19-Mar-15 13:47   Troponin I < 0.02 (0.00-0.05 0.05 ng/mL or less: NEGATIVE  Repeat testing in 3-6 hrs  if clinically indicated. >0.05 ng/mL: POTENTIAL  MYOCARDIAL INJURY. Repeat  testing in 3-6 hrs if  clinically indicated. NOTE: An increase or decrease  of 30% or more on serial  testing suggests a  clinically important change)  Routine UA:  19-Mar-15 13:47   Color (UA) Yellow  Clarity (UA) Hazy  Glucose (UA) Negative  Bilirubin (UA) Negative  Ketones (UA) Negative  Specific Gravity (UA) 1.012  Blood (UA) 1+  pH (UA) 7.0  Protein (UA) Negative  Nitrite (UA) Negative  Leukocyte Esterase (UA) Negative (Result(s) reported on 25 Mar 2013 at 02:26PM.)  RBC (UA) 17 /HPF  WBC (UA) <1 /HPF  Bacteria (UA) NONE SEEN  Epithelial Cells (UA) 1 /HPF  Hyaline Cast (UA) 13 /LPF (Result(s) reported on 25 Mar 2013 at 02:26PM.)  Routine Coag:  19-Mar-15 13:47   Prothrombin 12.2  INR 0.9 (INR reference interval applies to patients on anticoagulant therapy. A single INR therapeutic range for coumarins is not optimal for all indications; however, the suggested range for most indications is 2.0 - 3.0. Exceptions to the INR Reference Range may include: Prosthetic heart valves, acute myocardial infarction, prevention of myocardial infarction, and combinations of aspirin and anticoagulant. The need for a higher or lower target INR must be assessed individually. Reference: The Pharmacology and Management of the Vitamin K  antagonists: the seventh ACCP Conference on Antithrombotic and Thrombolytic Therapy. CZOXWR.6045Sept:126 (3suppl): [id number] A HCT value >55% may artifactually increase  the PT.  In one study,  the increase was an average of 25%. Reference:  "Effect on Routine and Special Coagulation Testing Values of Citrate Anticoagulant Adjustment in Patients with High HCT Values." American Journal of Clinical Pathology 2006;126:400-405.)  Routine Hem:  19-Mar-15 13:47   WBC (CBC) 5.9  RBC (CBC) 3.81  Hemoglobin (CBC) 12.4  Hematocrit (CBC) 35.6  Platelet Count (CBC) 220  MCV 94  MCH 32.6  MCHC 34.8  RDW 12.0  Neutrophil % 61.5  Lymphocyte % 28.2  Monocyte % 7.5  Eosinophil % 2.3  Basophil % 0.5  Neutrophil # 3.6  Lymphocyte # 1.7  Monocyte # 0.4  Eosinophil # 0.1  Basophil # 0.0 (Result(s) reported on 25 Mar 2013 at 02:05PM.)   Radiology Results: CT:    19-Mar-15 14:37, CT Head Without Contrast  CT Head Without Contrast   REASON FOR EXAM:  WEAKNESS  COMMENTS:       PROCEDURE: CT  - CT HEAD WITHOUT CONTRAST  - Mar 25 2013  2:37PM     CLINICAL DATA:  Code stroke, left side weakness    EXAM:  CT HEAD WITHOUT CONTRAST    TECHNIQUE:  Contiguous axial images were obtainedfrom the base of the skull  through the vertex without intravenous contrast.    COMPARISON:  None.  FINDINGS:  No skull fracture is noted. Mild mucosal thickening left ethmoid air  cells. The mastoid air cells are unremarkable.    No intracranial hemorrhage, mass effect or midline shift. Moderate  cerebral atrophy. Small lacunar infarct is noted in right corona  radiata. There is periventricular and extensive patchy subcortical  white matter decreased attenuation probable due to chronic small  vessel ischemic changes. No definite cortical infarction. No mass  lesion is noted on this unenhanced scan. If there is high clinical  suspicious for evolving acute infarct follow-up MRI with diffusion  imaging is recommended.     IMPRESSION:  No intracranial hemorrhage, mass effect or midline shift. Moderate  cerebral atrophy. Small lacunar infarct is noted in right  corona  radiata. There is periventricular and extensive patchy subcortical  white matter decreased attenuation probable due to chronicsmall  vessel ischemic changes. No definite cortical infarction. No mass  lesion is noted on this unenhanced scan. If there is high clinical  suspicious for evolving acute infarct follow-up MRI with diffusion  imaging is recommended.      Electronically Signed    By: Lahoma Crocker M.D.    On: 03/25/2013 14:40         Verified By: Ephraim Hamburger, M.D.,   Radiology Impression: Radiology Impression: CT of head personally reviewed by me and shows diffuse white matter changes, more prominent on the R frontal area   Impression/Recommendations: Recommendations:   labs reviewed by me notes reviewed by me   Possible R hemispheric infarct-  pt has mild increase in weakness over prior poor baseline.  Pt is within the time for tPA but risk probably equal benefit given the fact that she already has CT changes.  After long discussion, pt refuses tPA as risk for death is a bigger concern now. HTN-  uncontrolled per pt MRI of brain w/o contrast place on stroke protocol continue aggrenox check LDL will follow results  Electronic Signatures: Jamison Neighbor (MD)  (Signed 19-Mar-15 16:02)  Authored: REFERRING PHYSICIAN, Consult, History of Present Illness, Review of Systems, PAST MEDICAL/SURGICAL HISTORY, HOME MEDICATIONS, ALLERGIES, Social/Family History, Physical Exam-, LAB RESULTS, RADIOLOGY RESULTS, Recommendations   Last Updated: 19-Mar-15 16:02 by Jamison Neighbor (MD)

## 2014-04-30 NOTE — H&P (Signed)
PATIENT NAME:  [name], [name] MR#:  [id number] DATE OF BIRTH:  [birth year]  DATE OF ADMISSION:  03/25/2013  ADDENDUM   The patient has hyponatremia with a sodium of 128, unknown reason. We are going to give her some IV fluids, check urine osmolality to evaluate for SIADH versus intravascular volume depletion.    ____________________________ Halifax Sink, MD rsg:np D: 03/25/2013 16:41:54 ET T: 03/25/2013 17:02:16 ET JOB#: [id number]  cc:  Sink, MD, <Dictator> Dallen Bunte America Brown MD ELECTRONICALLY SIGNED 04/04/2013 13:47

## 2014-04-30 NOTE — H&P (Signed)
PATIENT NAMEHERMINIA, [name] MR#:  [id number] DATE OF BIRTH:  12-26-1940  DATE OF ADMISSION:  03/25/2013  REASON FOR ADMISSION: Increased weakness on left side of her body. Unable to walk.   HISTORY OF PRESENT ILLNESS: This is a very nice 74 year old female patient of Dr. Netty Starring, referred to me by Dr. Benjaman Lobe with history of presenting to the Emergency Department with worsening of left side weakness. The patient apparently had a stroke back in 2010 that left her hemiparetic on the left side, so she has baseline left side weakness with significant contractures of the hand, but she is able to move her arm up a couple of centimeters off the bed and she also has weakness of her leg with drooped foot; she drags her foot around unless she is wearing her special ankle brace and a neurostimulator at the level of the knee, for the peroneal nerve. She is dependent on her husband at home and she is not able to lift her arm against gravity for the most part.   The patient has some mild facial droop, but she says that her mouth feels numb all the time, but today she was having some drooping and some drooling. Everything started around 12:55 when she was going to the mall for Bible study. The husband left her at the front of the mall, went to park and when he came back, she had moved down to wall where she was holding it by somebody else helping her.   Overall, the patient is admitted for evaluation of a possible new CVA. She was elevated by Dr. Benjaman Lobe, who was concerned about it and wanted to do tPA. Dr. Tamala Julian was consulted on the case. Dr. Tamala Julian documented that he discussed the risks and benefits with the patient and the patient refused tPA due to risk of death. She has high blood pressure and it has been uncontrolled.   REVIEW OF SYSTEMS: A 12-system review of systems is done.  CONSTITUTIONAL: No fever or fatigue. Positive weakness today, mostly on the left side. No weight loss or weight gain.  EYES: No  blurry vision. No double vision.  ENT:  No difficulty swallowing. Occasional numbness at the level of the mouth with worsening today with droop of the left corner of her mouth. No difficulty swallowing; the patient passed swallow eval.  RESPIRATORY: No cough, wheezing or hemoptysis.  CARDIOVASCULAR: No chest pain, orthopnea or edema.  GASTROINTESTINAL: No nausea, vomiting, abdominal pain.  GENITOURINARY: No dysuria, hematuria or changes in frequency.  ENDOCRINE: No polyuria, polydipsia or polyphagia.  HEMATOLOGIC, LYMPHATIC: No anemia, easy bruising or bleeding.  MUSCULOSKELETAL: No neck pain or back pain.  NEUROLOGIC: Positive previous CVA with left side hemiparesis. No vertigo. No dementia.  PSYCHIATRIC: The patient takes SSRIs for well-controlled depression.   PAST MEDICAL HISTORY:  1.  CVA.  2.  Hypertension.  3.  Left foot drop.  4.  Depression.   ALLERGIES: Not known drug allergies.   PAST SURGICAL HISTORY: None.   FAMILY HISTORY: Positive for CVA in her mom, CVA in her brother who also had heart disease and he had a CABG. Her brother had an MI. Cancer in her father, which was in the liver with metastasis to lung.   SOCIAL HISTORY: The patient does not smoke. She does not drink. She lives with her husband. She is retired.   MEDICATIONS: Aggrenox 25/200 twice daily, citalopram 20 mg once a day, clonidine 0.1 mg twice daily, gabapentin 300 mg 3 times  a day, hydrochlorothiazide 25 mg once daily, losartan 50 mg twice daily, lovastatin 20 mg once daily, omeprazole 20 mg once daily, tramadol 50 mg 3 times daily, Tylenol Extra Strength 500 mg take 2 tablets every 8 hours, Zanaflex 4 mg 4 times a day.   PHYSICAL EXAMINATION:  VITAL SIGNS: Blood pressure up to 220/100, right now 185/85; pulse 68; respirations 18; temperature 97; oxygen saturation 95% on room air. GENERAL: Alert and oriented x 3, in no acute distress. No respiratory distress. Hemodynamically stable.  HEENT: Her pupils are  equal and reactive. Extraocular movements are intact. Mucosae are moist. Anicteric sclerae. Pink conjunctivae. No oral lesions. No oropharyngeal exudates.  NECK: Supple. No JVD. No thyromegaly. No adenopathy. No carotid bruits.  CARDIOVASCULAR: Regular rate and rhythm. No murmurs, rubs or gallops.  LUNGS: No displacement of PMI. Lungs are clear without any wheezing or crepitus. No use of accessory muscles.  EXTREMITIES: No edema, cyanosis or clubbing. Pulses +2. Capillary refill less than 3.  PSYCHIATRIC: Good mood. No signs of agitation.  LYMPHATIC: Negative for lymphadenopathy in neck or supraclavicular areas.  NEUROLOGIC: Alert and oriented x 3. In no acute distress. Has moderate dysarthria. Follows commands. Cranial nerves overall stable with left foot drop. Tongue is actually slightly deviated to the left. The rest of the cranial nerves are normal. Strength is 5/5 on the right side, 1/5 on the left upper extremity and 2/5 in the left lower extremity with contractures at the level of the hand. Unable to open her fist on the left. DTRs +2, faster on the leg.   LABORATORY RESULTS: CT scan of the head shows a small lacunar infarct is noted in the right coronary radiata. No intracranial hemorrhage, mass effect or middle shift. Moderate cerebral atrophy and periventricular extensive patchy subcortical white matter disease.   EKG: Normal sinus rhythm. No ST depression or elevation.   ASSESSMENT AND PLAN: A 74 year old female with a history of suffered 2 cerebrovascular accidents, comes with worsening left side weakness.   1. Cerebrovascular accident of the right side coronal radiata likely. The patient is seen by Dr. Tamala Julian who thinks that this is on evolution. The patient has been asked for the possibility of tPA, but the patient has refused. Dr. Tamala Julian recommended to continue Aggrenox at this moment since the patient is at risk.  I think that maybe we should change to Plavix, but since Dr. Tamala Julian already  saw the patient, I will follow his recommendations. Let now Dr. Netty Starring evaluate.  2.  The patient has hypertension for what we are going to allow some permissive hypertension. Treat systolics above 408, diastolics above 144. Continue statin.  3.  Swallow evaluation has been done; the patient passed. The patient does not have significant dysarthria.  4.  Continue diet. Chop her meats since she is not able to do it.  5.  Physical therapy evaluation.   TIME SPENT: I spent about 45 minutes with this patient today.  ____________________________ [place] Sink, MD rsg:np D: 03/25/2013 16:40:00 ET T: 03/25/2013 18:36:17 ET JOB#: [id number]  cc: Teague Sink, MD, <Dictator> [place] America Brown MD ELECTRONICALLY SIGNED 04/04/2013 13:48

## 2014-09-30 DIAGNOSIS — N3946 Mixed incontinence: Secondary | ICD-10-CM | POA: Insufficient documentation

## 2014-11-18 ENCOUNTER — Inpatient Hospital Stay
Admission: EM | Admit: 2014-11-18 | Discharge: 2014-11-30 | DRG: 193 | Disposition: A | Discharge status: Skilled Nursing Facility | Payer: PPO | Attending: Internal Medicine | Admitting: Internal Medicine

## 2014-11-18 ENCOUNTER — Emergency Department: Payer: PPO

## 2014-11-18 ENCOUNTER — Other Ambulatory Visit: Payer: Self-pay

## 2014-11-18 ENCOUNTER — Encounter: Payer: Self-pay | Admitting: Medical Oncology

## 2014-11-18 DIAGNOSIS — I272 Other secondary pulmonary hypertension: Secondary | ICD-10-CM | POA: Diagnosis present

## 2014-11-18 DIAGNOSIS — R0600 Dyspnea, unspecified: Secondary | ICD-10-CM

## 2014-11-18 DIAGNOSIS — R410 Disorientation, unspecified: Secondary | ICD-10-CM | POA: Diagnosis not present

## 2014-11-18 DIAGNOSIS — D62 Acute posthemorrhagic anemia: Secondary | ICD-10-CM | POA: Diagnosis not present

## 2014-11-18 DIAGNOSIS — Z23 Encounter for immunization: Secondary | ICD-10-CM

## 2014-11-18 DIAGNOSIS — D72829 Elevated white blood cell count, unspecified: Secondary | ICD-10-CM

## 2014-11-18 DIAGNOSIS — G8929 Other chronic pain: Secondary | ICD-10-CM

## 2014-11-18 DIAGNOSIS — F329 Major depressive disorder, single episode, unspecified: Secondary | ICD-10-CM | POA: Diagnosis present

## 2014-11-18 DIAGNOSIS — J45909 Unspecified asthma, uncomplicated: Secondary | ICD-10-CM | POA: Diagnosis present

## 2014-11-18 DIAGNOSIS — F419 Anxiety disorder, unspecified: Secondary | ICD-10-CM | POA: Diagnosis present

## 2014-11-18 DIAGNOSIS — T428X5A Adverse effect of antiparkinsonism drugs and other central muscle-tone depressants, initial encounter: Secondary | ICD-10-CM | POA: Diagnosis not present

## 2014-11-18 DIAGNOSIS — M79603 Pain in arm, unspecified: Secondary | ICD-10-CM

## 2014-11-18 DIAGNOSIS — I69398 Other sequelae of cerebral infarction: Secondary | ICD-10-CM

## 2014-11-18 DIAGNOSIS — E041 Nontoxic single thyroid nodule: Secondary | ICD-10-CM | POA: Diagnosis present

## 2014-11-18 DIAGNOSIS — E871 Hypo-osmolality and hyponatremia: Secondary | ICD-10-CM | POA: Diagnosis present

## 2014-11-18 DIAGNOSIS — I952 Hypotension due to drugs: Secondary | ICD-10-CM | POA: Diagnosis not present

## 2014-11-18 DIAGNOSIS — I10 Essential (primary) hypertension: Secondary | ICD-10-CM | POA: Diagnosis present

## 2014-11-18 DIAGNOSIS — R001 Bradycardia, unspecified: Secondary | ICD-10-CM | POA: Diagnosis not present

## 2014-11-18 DIAGNOSIS — Y92239 Unspecified place in hospital as the place of occurrence of the external cause: Secondary | ICD-10-CM | POA: Diagnosis not present

## 2014-11-18 DIAGNOSIS — M7981 Nontraumatic hematoma of soft tissue: Secondary | ICD-10-CM | POA: Diagnosis not present

## 2014-11-18 DIAGNOSIS — R0602 Shortness of breath: Secondary | ICD-10-CM

## 2014-11-18 DIAGNOSIS — M79602 Pain in left arm: Secondary | ICD-10-CM | POA: Diagnosis present

## 2014-11-18 DIAGNOSIS — R1032 Left lower quadrant pain: Secondary | ICD-10-CM

## 2014-11-18 DIAGNOSIS — T380X5A Adverse effect of glucocorticoids and synthetic analogues, initial encounter: Secondary | ICD-10-CM | POA: Diagnosis not present

## 2014-11-18 DIAGNOSIS — R4182 Altered mental status, unspecified: Secondary | ICD-10-CM

## 2014-11-18 DIAGNOSIS — E876 Hypokalemia: Secondary | ICD-10-CM | POA: Diagnosis present

## 2014-11-18 DIAGNOSIS — I69354 Hemiplegia and hemiparesis following cerebral infarction affecting left non-dominant side: Secondary | ICD-10-CM

## 2014-11-18 DIAGNOSIS — J189 Pneumonia, unspecified organism: Secondary | ICD-10-CM | POA: Diagnosis present

## 2014-11-18 DIAGNOSIS — J9601 Acute respiratory failure with hypoxia: Secondary | ICD-10-CM | POA: Diagnosis present

## 2014-11-18 DIAGNOSIS — Y92230 Patient room in hospital as the place of occurrence of the external cause: Secondary | ICD-10-CM | POA: Diagnosis not present

## 2014-11-18 DIAGNOSIS — R739 Hyperglycemia, unspecified: Secondary | ICD-10-CM | POA: Diagnosis not present

## 2014-11-18 HISTORY — DX: Essential (primary) hypertension: I10

## 2014-11-18 HISTORY — DX: Cerebral infarction, unspecified: I63.9

## 2014-11-18 HISTORY — DX: Transient cerebral ischemic attack, unspecified: G45.9

## 2014-11-18 LAB — CBC WITH DIFFERENTIAL/PLATELET
BASOS ABS: 0 10*3/uL (ref 0–0.1)
Basophils Relative: 1 %
EOS PCT: 4 %
Eosinophils Absolute: 0.3 10*3/uL (ref 0–0.7)
HEMATOCRIT: 37.2 % (ref 35.0–47.0)
Hemoglobin: 13 g/dL (ref 12.0–16.0)
LYMPHS PCT: 24 %
Lymphs Abs: 1.6 10*3/uL (ref 1.0–3.6)
MCH: 31.7 pg (ref 26.0–34.0)
MCHC: 34.9 g/dL (ref 32.0–36.0)
MCV: 90.9 fL (ref 80.0–100.0)
MONO ABS: 0.6 10*3/uL (ref 0.2–0.9)
MONOS PCT: 8 %
NEUTROS ABS: 4.5 10*3/uL (ref 1.4–6.5)
Neutrophils Relative %: 63 %
PLATELETS: 220 10*3/uL (ref 150–440)
RBC: 4.09 MIL/uL (ref 3.80–5.20)
RDW: 12.3 % (ref 11.5–14.5)
WBC: 7 10*3/uL (ref 3.6–11.0)

## 2014-11-18 LAB — HEPATIC FUNCTION PANEL
ALK PHOS: 50 U/L (ref 38–126)
ALT: 19 U/L (ref 14–54)
AST: 28 U/L (ref 15–41)
Albumin: 4.3 g/dL (ref 3.5–5.0)
BILIRUBIN TOTAL: 0.3 mg/dL (ref 0.3–1.2)
Total Protein: 7.4 g/dL (ref 6.5–8.1)

## 2014-11-18 LAB — BASIC METABOLIC PANEL
ANION GAP: 8 (ref 5–15)
BUN: 9 mg/dL (ref 6–20)
CALCIUM: 10.2 mg/dL (ref 8.9–10.3)
CO2: 31 mmol/L (ref 22–32)
CREATININE: 0.73 mg/dL (ref 0.44–1.00)
Chloride: 92 mmol/L — ABNORMAL LOW (ref 101–111)
GFR calc Af Amer: >60 mL/min (ref >60)
GLUCOSE: 107 mg/dL — AB (ref 65–99)
Potassium: 3.3 mmol/L — ABNORMAL LOW (ref 3.5–5.1)
Sodium: 131 mmol/L — ABNORMAL LOW (ref 135–145)

## 2014-11-18 LAB — URINALYSIS COMPLETE WITH MICROSCOPIC (ARMC ONLY)
Bilirubin Urine: NEGATIVE
GLUCOSE, UA: NEGATIVE mg/dL
Ketones, ur: NEGATIVE mg/dL
LEUKOCYTES UA: NEGATIVE
NITRITE: NEGATIVE
PH: 7 (ref 5.0–8.0)
Protein, ur: NEGATIVE mg/dL
SPECIFIC GRAVITY, URINE: 1.005 (ref 1.005–1.030)
Squamous Epithelial / LPF: NONE SEEN

## 2014-11-18 LAB — TROPONIN I: Troponin I: <0.03 ng/mL (ref <0.031)

## 2014-11-18 LAB — LIPASE, BLOOD: Lipase: 21 U/L (ref 11–51)

## 2014-11-18 MED ORDER — LABETALOL HCL 5 MG/ML IV SOLN
10.0000 mg | Freq: Once | INTRAVENOUS | Status: AC
  Administered 2014-11-18: 10 mg via INTRAVENOUS
  Filled 2014-11-18: qty 4

## 2014-11-18 MED ORDER — SODIUM CHLORIDE 0.9 % IV BOLUS (SEPSIS)
500.0000 mL | INTRAVENOUS | Status: AC
  Administered 2014-11-18: 500 mL via INTRAVENOUS

## 2014-11-18 MED ORDER — LEVOFLOXACIN IN D5W 750 MG/150ML IV SOLN
750.0000 mg | Freq: Once | INTRAVENOUS | Status: AC
  Administered 2014-11-18: 750 mg via INTRAVENOUS
  Filled 2014-11-18: qty 150

## 2014-11-18 MED ORDER — IPRATROPIUM-ALBUTEROL 0.5-2.5 (3) MG/3ML IN SOLN
3.0000 mL | Freq: Once | RESPIRATORY_TRACT | Status: AC
  Administered 2014-11-18: 3 mL via RESPIRATORY_TRACT
  Filled 2014-11-18: qty 3

## 2014-11-18 MED ORDER — METHYLPREDNISOLONE SODIUM SUCC 125 MG IJ SOLR
125.0000 mg | Freq: Once | INTRAMUSCULAR | Status: AC
  Administered 2014-11-18: 125 mg via INTRAVENOUS
  Filled 2014-11-18: qty 2

## 2014-11-18 MED ORDER — TIZANIDINE HCL 4 MG PO TABS
4.0000 mg | ORAL_TABLET | ORAL | Status: AC
  Administered 2014-11-18: 4 mg via ORAL
  Filled 2014-11-18: qty 1

## 2014-11-18 MED ORDER — GABAPENTIN 300 MG PO CAPS
300.0000 mg | ORAL_CAPSULE | ORAL | Status: AC
  Administered 2014-11-18: 300 mg via ORAL
  Filled 2014-11-18: qty 1

## 2014-11-18 NOTE — ED Provider Notes (Signed)
[facility] Emergency Department Provider Note  ____________________________________________  Time seen: Approximately 5:23 PM  I have reviewed the triage vital signs and the nursing notes.   HISTORY  Chief Complaint Cough    HPI EATHEL SPAHN [name] is a 74 y.o. female w/ hx of CVA w/ residual L-sided deficits who presents with a recurrence of cough, congestion, and difficulty breathing.  She struggled with bronchitis symptoms several weeks ago and was treated as an outpatient with breathing treatments and antibiotics for two weeks.  She improved, she has been getting worse over the last several days again and she and her husband report that she is worse than she was before.  Upon arrival in the emergency department her room air SPO2 was 89%.  She was actually seen at [facility] in the ED and sent here for further evaluation.  Symptoms are severe per patient and husband, worsened by exertion, helped minimally by rest.  Denies chest pain, N/V, abdominal pain.  Severe LUE pain, but this is chronic.   Past Medical History  Diagnosis Date  . Stroke ([place])   . Hypertension   . TIA (transient ischemic attack)     Patient Active Problem List   Diagnosis Date Noted  . Pneumonia 11/19/2014  . Community acquired pneumonia 11/18/2014  . Uncontrolled hypertension 11/18/2014  . LARYNGITIS, CHRONIC 08/01/2009  . HYPOKALEMIA 06/23/2009  . SHOULDER IMPINGEMENT SYNDROME, LEFT 04/20/2009  . DEGENERATIVE JOINT DISEASE 04/13/2009  . SHOULDER PAIN 04/10/2009  . HEMIPARESIS, LEFT 04/04/2009  . LACUNAR INFARCTION 04/04/2009  . HYPERGLYCEMIA 04/04/2009  . Nonspecific (abnormal) findings on radiological and other examination of body structure 10/22/2007  . ABNORMAL CHEST XRAY 10/22/2007  . GERD 10/13/2007  . HYPOTHYROIDISM 07/23/2006  . HYPERLIPIDEMIA 07/23/2006  . HYPERTENSION 07/23/2006  . ROSACEA 07/23/2006  . OSTEOPENIA 07/23/2006  . ALLERGY 07/23/2006    Past Surgical  History  Procedure Laterality Date  . Abdominal hysterectomy    . Appendectomy      Current Outpatient Rx  Name  Route  Sig  Dispense  Refill  . acetaminophen (TYLENOL) 500 MG tablet   Oral   Take 500 mg by mouth every 6 (six) hours as needed for mild pain.         Marland Kitchen amLODipine (NORVASC) 5 MG tablet   Oral   Take 5 mg by mouth daily.         . Calcium Carbonate-Vitamin D (CALCIUM 600+D) 600-400 MG-UNIT tablet   Oral   Take 2 tablets by mouth 2 (two) times daily.         . carvedilol (COREG) 3.125 MG tablet   Oral   Take 3.125 mg by mouth 2 (two) times daily.         . citalopram (CELEXA) 20 MG tablet   Oral   Take 20 mg by mouth daily.         Marland Kitchen dipyridamole-aspirin (AGGRENOX) 200-25 MG 12hr capsule   Oral   Take 1 capsule by mouth 2 (two) times daily.         Marland Kitchen gabapentin (NEURONTIN) 300 MG capsule   Oral   Take 300 mg by mouth 3 (three) times daily.         . Guaifenesin (MUCINEX MAXIMUM STRENGTH) 1200 MG TB12   Oral   Take 1,200 mg by mouth 2 (two) times daily.         . hydrochlorothiazide (HYDRODIURIL) 25 MG tablet   Oral   Take 25 mg  by mouth daily.         Marland Kitchen losartan (COZAAR) 50 MG tablet   Oral   Take 50 mg by mouth 2 (two) times daily.         Marland Kitchen lovastatin (MEVACOR) 20 MG tablet   Oral   Take 20 mg by mouth at bedtime.         Marland Kitchen omeprazole (PRILOSEC) 20 MG capsule   Oral   Take 20 mg by mouth daily.         Marland Kitchen oxybutynin (DITROPAN) 5 MG tablet   Oral   Take 5 mg by mouth 2 (two) times daily.         Marland Kitchen tiZANidine (ZANAFLEX) 4 MG tablet   Oral   Take 4 mg by mouth 4 (four) times daily.         . traMADol (ULTRAM) 50 MG tablet   Oral   Take 50 mg by mouth every 8 (eight) hours as needed for moderate pain.           Allergies Ace inhibitors; Latex; and Tetracycline  History reviewed. No pertinent family history.  Social History Social History  Substance Use Topics  . Smoking status: Never Smoker   .  Smokeless tobacco: None  . Alcohol Use: No     Comment: does not drink alcohol    Review of Systems Constitutional: No fever/chills Eyes: No visual changes. ENT: No sore throat.  +Congestion Cardiovascular: Denies chest pain. Respiratory: SOB, cough, wheezing Gastrointestinal: No abdominal pain.  No nausea, no vomiting.  No diarrhea.  No constipation. Genitourinary: Negative for dysuria. Musculoskeletal: Negative for back pain. Skin: Negative for rash. Neurological: Negative for headaches, focal weakness or numbness.  Generalized weakness and fatigue  10-point ROS otherwise negative.  ____________________________________________   PHYSICAL EXAM:  VITAL SIGNS: ED Triage Vitals  Enc Vitals Group     BP 11/18/14 1703 186/88 mmHg     Pulse Rate 11/18/14 1703 82     Resp 11/18/14 1703 20     Temp 11/18/14 1703 98.2 F (36.8 C)     Temp Source 11/18/14 1703 Oral     SpO2 11/18/14 1703 94 %     Weight 11/18/14 1703 160 lb (72.576 kg)     Height 11/18/14 1703 5\' 1"  (1.549 m)     Head Cir --      Peak Flow --      Pain Score 11/18/14 1704 7     Pain Loc --      Pain Edu? --      Excl. in [place]? --     Constitutional: Alert and oriented. NAD but ill-appearing Eyes: Conjunctivae are normal. PERRL. EOMI. Head: Atraumatic. Nose: No congestion/rhinnorhea. Mouth/Throat: Mucous membranes are moist.  Oropharynx non-erythematous. Neck: No stridor.   Cardiovascular: Normal rate, regular rhythm. Grossly normal heart sounds.  Good peripheral circulation. Respiratory: Normal respiratory effort.  Expiratory Wheezing throughout, coarse breath sounds in bases Gastrointestinal: Soft and nontender. No distention. No abdominal bruits. No CVA tenderness. Musculoskeletal: No lower extremity tenderness nor edema.  No joint effusions. Neurologic:  Normal speech and language. Chronic L-sided extremity impairment s/p prior Skin:  Skin is warm, dry and intact. No rash  noted.   ____________________________________________   LABS (all labs ordered are listed, but only abnormal results are displayed)  Labs Reviewed  BASIC METABOLIC PANEL - Abnormal; Notable for the following:    Sodium 131 (*)    Potassium 3.3 (*)    Chloride 92 (*)  Glucose, Bld 107 (*)    All other components within normal limits  HEPATIC FUNCTION PANEL - Abnormal; Notable for the following:    Bilirubin, Direct <0.1 (*)    All other components within normal limits  URINALYSIS COMPLETEWITH MICROSCOPIC ([facility] ONLY) - Abnormal; Notable for the following:    Color, Urine STRAW (*)    APPearance HAZY (*)    Hgb urine dipstick 1+ (*)    Bacteria, UA RARE (*)    All other components within normal limits  CULTURE, BLOOD (ROUTINE X 2)  CULTURE, BLOOD (ROUTINE X 2)  CBC WITH DIFFERENTIAL/PLATELET  TROPONIN I  LIPASE, BLOOD   ____________________________________________  EKG  ED ECG REPORT I, Sharonna Vinje, the attending physician, personally viewed and interpreted this ECG.  Date: 11/18/2014 EKG Time: 17:04 Rate: 79 Rhythm: NSR QRS Axis: normal Intervals: normal ST/T Wave abnormalities: normal Conduction Disutrbances: none Narrative Interpretation: unremarkable  ____________________________________________  RADIOLOGY   Dg Chest 2 View  11/18/2014  CLINICAL DATA:  Subacute onset of cough and congestion for 2 weeks. High blood pressure. Initial encounter. EXAM: CHEST  2 VIEW COMPARISON:  Chest radiograph performed 03/10/2014 FINDINGS: The lungs are well-aerated. Mild bibasilar opacities likely reflect atelectasis. Mild vascular congestion is seen. There is no evidence of pleural effusion or pneumothorax. The heart is borderline enlarged. No acute osseous abnormalities are seen. IMPRESSION: Mild bibasilar opacities likely reflect atelectasis. Mild vascular congestion and borderline cardiomegaly noted. Electronically Signed   By: Garald Balding M.D.   On: 11/18/2014 18:17     ____________________________________________   PROCEDURES  Procedure(s) performed: None  Critical Care performed: No ____________________________________________   INITIAL IMPRESSION / ASSESSMENT AND PLAN / ED COURSE  Pertinent labs & imaging results that were available during my care of the patient were reviewed by me and considered in my medical decision making (see chart for details).  Recent outpatient antibiotics, now worse w/ hypoxemia.  Suspect bibasilar opacities represents PNA.  Cultures, abx, fluids, tx for bronchitis as well (Duonebs, solumedrol).  ____________________________________________  FINAL CLINICAL IMPRESSION(S) / ED DIAGNOSES  Final diagnoses:  CAP (community acquired pneumonia)  Chronic upper extremity pain, unspecified laterality      NEW MEDICATIONS STARTED DURING THIS VISIT:  New Prescriptions   No medications on file     Hinda Kehr, MD 11/19/14 0022

## 2014-11-18 NOTE — ED Notes (Signed)
Pt from [facility] with reports that pt has been having cough/congestion x 2 weeks. Pt was seen by PCP Tuesday and was told that her BP was elevated, pt concerned that BP in elevated d/t hx of stroke. Pt reports left arm pain but this is a chronic pain.

## 2014-11-19 DIAGNOSIS — R001 Bradycardia, unspecified: Secondary | ICD-10-CM | POA: Diagnosis not present

## 2014-11-19 DIAGNOSIS — J45909 Unspecified asthma, uncomplicated: Secondary | ICD-10-CM | POA: Diagnosis present

## 2014-11-19 DIAGNOSIS — R410 Disorientation, unspecified: Secondary | ICD-10-CM | POA: Diagnosis not present

## 2014-11-19 DIAGNOSIS — I272 Other secondary pulmonary hypertension: Secondary | ICD-10-CM | POA: Diagnosis present

## 2014-11-19 DIAGNOSIS — D62 Acute posthemorrhagic anemia: Secondary | ICD-10-CM | POA: Diagnosis not present

## 2014-11-19 DIAGNOSIS — R079 Chest pain, unspecified: Secondary | ICD-10-CM | POA: Diagnosis not present

## 2014-11-19 DIAGNOSIS — I69398 Other sequelae of cerebral infarction: Secondary | ICD-10-CM | POA: Diagnosis not present

## 2014-11-19 DIAGNOSIS — J9601 Acute respiratory failure with hypoxia: Secondary | ICD-10-CM | POA: Diagnosis present

## 2014-11-19 DIAGNOSIS — T428X5A Adverse effect of antiparkinsonism drugs and other central muscle-tone depressants, initial encounter: Secondary | ICD-10-CM | POA: Diagnosis not present

## 2014-11-19 DIAGNOSIS — R739 Hyperglycemia, unspecified: Secondary | ICD-10-CM | POA: Diagnosis not present

## 2014-11-19 DIAGNOSIS — M7981 Nontraumatic hematoma of soft tissue: Secondary | ICD-10-CM | POA: Diagnosis not present

## 2014-11-19 DIAGNOSIS — E876 Hypokalemia: Secondary | ICD-10-CM | POA: Diagnosis present

## 2014-11-19 DIAGNOSIS — I69354 Hemiplegia and hemiparesis following cerebral infarction affecting left non-dominant side: Secondary | ICD-10-CM | POA: Diagnosis not present

## 2014-11-19 DIAGNOSIS — I10 Essential (primary) hypertension: Secondary | ICD-10-CM | POA: Diagnosis present

## 2014-11-19 DIAGNOSIS — E871 Hypo-osmolality and hyponatremia: Secondary | ICD-10-CM | POA: Diagnosis present

## 2014-11-19 DIAGNOSIS — F329 Major depressive disorder, single episode, unspecified: Secondary | ICD-10-CM | POA: Diagnosis present

## 2014-11-19 DIAGNOSIS — T380X5A Adverse effect of glucocorticoids and synthetic analogues, initial encounter: Secondary | ICD-10-CM | POA: Diagnosis not present

## 2014-11-19 DIAGNOSIS — J189 Pneumonia, unspecified organism: Secondary | ICD-10-CM | POA: Diagnosis present

## 2014-11-19 DIAGNOSIS — Y92239 Unspecified place in hospital as the place of occurrence of the external cause: Secondary | ICD-10-CM | POA: Diagnosis not present

## 2014-11-19 DIAGNOSIS — S3662XA Contusion of rectum, initial encounter: Secondary | ICD-10-CM | POA: Diagnosis not present

## 2014-11-19 DIAGNOSIS — R404 Transient alteration of awareness: Secondary | ICD-10-CM | POA: Diagnosis not present

## 2014-11-19 DIAGNOSIS — M79602 Pain in left arm: Secondary | ICD-10-CM | POA: Diagnosis present

## 2014-11-19 DIAGNOSIS — E041 Nontoxic single thyroid nodule: Secondary | ICD-10-CM | POA: Diagnosis present

## 2014-11-19 DIAGNOSIS — Z23 Encounter for immunization: Secondary | ICD-10-CM | POA: Diagnosis not present

## 2014-11-19 DIAGNOSIS — F419 Anxiety disorder, unspecified: Secondary | ICD-10-CM | POA: Diagnosis present

## 2014-11-19 DIAGNOSIS — Y92230 Patient room in hospital as the place of occurrence of the external cause: Secondary | ICD-10-CM | POA: Diagnosis not present

## 2014-11-19 DIAGNOSIS — I952 Hypotension due to drugs: Secondary | ICD-10-CM | POA: Diagnosis not present

## 2014-11-19 DIAGNOSIS — Z515 Encounter for palliative care: Secondary | ICD-10-CM | POA: Diagnosis not present

## 2014-11-19 LAB — CBC
HEMATOCRIT: 38.6 % (ref 35.0–47.0)
HEMOGLOBIN: 13.5 g/dL (ref 12.0–16.0)
MCH: 31.9 pg (ref 26.0–34.0)
MCHC: 34.9 g/dL (ref 32.0–36.0)
MCV: 91.4 fL (ref 80.0–100.0)
Platelets: 223 10*3/uL (ref 150–440)
RBC: 4.23 MIL/uL (ref 3.80–5.20)
RDW: 12.7 % (ref 11.5–14.5)
WBC: 5.9 10*3/uL (ref 3.6–11.0)

## 2014-11-19 LAB — BASIC METABOLIC PANEL
ANION GAP: 12 (ref 5–15)
BUN: 7 mg/dL (ref 6–20)
CO2: 26 mmol/L (ref 22–32)
Calcium: 9.9 mg/dL (ref 8.9–10.3)
Chloride: 95 mmol/L — ABNORMAL LOW (ref 101–111)
Creatinine, Ser: 0.64 mg/dL (ref 0.44–1.00)
GFR calc Af Amer: >60 mL/min (ref >60)
Glucose, Bld: 164 mg/dL — ABNORMAL HIGH (ref 65–99)
POTASSIUM: 3.2 mmol/L — AB (ref 3.5–5.1)
SODIUM: 133 mmol/L — AB (ref 135–145)

## 2014-11-19 MED ORDER — PNEUMOCOCCAL VAC POLYVALENT 25 MCG/0.5ML IJ INJ
0.5000 mL | INJECTION | INTRAMUSCULAR | Status: AC
  Administered 2014-11-30: 0.5 mL via INTRAMUSCULAR
  Filled 2014-11-19 (×2): qty 0.5

## 2014-11-19 MED ORDER — CETYLPYRIDINIUM CHLORIDE 0.05 % MT LIQD
7.0000 mL | Freq: Two times a day (BID) | OROMUCOSAL | Status: DC
  Administered 2014-11-19 – 2014-11-30 (×18): 7 mL via OROMUCOSAL

## 2014-11-19 MED ORDER — HYDROMORPHONE HCL 1 MG/ML IJ SOLN
1.0000 mg | INTRAMUSCULAR | Status: DC | PRN
  Administered 2014-11-19 (×3): 1 mg via INTRAVENOUS
  Filled 2014-11-19 (×2): qty 1

## 2014-11-19 MED ORDER — DEXTROSE 5 % IV SOLN
500.0000 mg | Freq: Every day | INTRAVENOUS | Status: DC
  Administered 2014-11-19 – 2014-11-20 (×2): 500 mg via INTRAVENOUS
  Filled 2014-11-19 (×3): qty 500

## 2014-11-19 MED ORDER — LABETALOL HCL 5 MG/ML IV SOLN
10.0000 mg | INTRAVENOUS | Status: DC | PRN
  Administered 2014-11-19 – 2014-11-26 (×4): 10 mg via INTRAVENOUS
  Filled 2014-11-19 (×5): qty 4

## 2014-11-19 MED ORDER — IPRATROPIUM-ALBUTEROL 0.5-2.5 (3) MG/3ML IN SOLN
3.0000 mL | RESPIRATORY_TRACT | Status: DC
  Administered 2014-11-19 – 2014-11-25 (×32): 3 mL via RESPIRATORY_TRACT
  Filled 2014-11-19 (×33): qty 3

## 2014-11-19 MED ORDER — OXYCODONE-ACETAMINOPHEN 5-325 MG PO TABS
ORAL_TABLET | ORAL | Status: AC
  Filled 2014-11-19: qty 2

## 2014-11-19 MED ORDER — CARVEDILOL 3.125 MG PO TABS
3.1250 mg | ORAL_TABLET | Freq: Two times a day (BID) | ORAL | Status: DC
Start: 2014-11-19 — End: 2014-11-25
  Administered 2014-11-19 – 2014-11-24 (×10): 3.125 mg via ORAL
  Filled 2014-11-19 (×11): qty 1

## 2014-11-19 MED ORDER — HYDROMORPHONE HCL 1 MG/ML IJ SOLN
INTRAMUSCULAR | Status: AC
  Administered 2014-11-19: 1 mg via INTRAVENOUS
  Filled 2014-11-19: qty 1

## 2014-11-19 MED ORDER — SODIUM CHLORIDE 0.9 % IJ SOLN
3.0000 mL | INTRAMUSCULAR | Status: DC | PRN
  Administered 2014-11-19: 3 mL via INTRAVENOUS
  Filled 2014-11-19: qty 10

## 2014-11-19 MED ORDER — ENOXAPARIN SODIUM 40 MG/0.4ML ~~LOC~~ SOLN
40.0000 mg | Freq: Every day | SUBCUTANEOUS | Status: DC
  Administered 2014-11-19 – 2014-11-28 (×11): 40 mg via SUBCUTANEOUS
  Filled 2014-11-19 (×11): qty 0.4

## 2014-11-19 MED ORDER — CALCIUM CARBONATE-VITAMIN D 500-200 MG-UNIT PO TABS
2.0000 | ORAL_TABLET | Freq: Two times a day (BID) | ORAL | Status: DC
  Administered 2014-11-19 – 2014-11-30 (×20): 2 via ORAL
  Filled 2014-11-19 (×23): qty 2

## 2014-11-19 MED ORDER — ASPIRIN-DIPYRIDAMOLE ER 25-200 MG PO CP12
1.0000 | ORAL_CAPSULE | Freq: Two times a day (BID) | ORAL | Status: DC
  Administered 2014-11-19 – 2014-11-30 (×22): 1 via ORAL
  Filled 2014-11-19 (×23): qty 1

## 2014-11-19 MED ORDER — DEXTROSE 5 % IV SOLN
1.0000 g | Freq: Every day | INTRAVENOUS | Status: DC
  Administered 2014-11-19 – 2014-11-23 (×6): 1 g via INTRAVENOUS
  Filled 2014-11-19 (×7): qty 10

## 2014-11-19 MED ORDER — ACETAMINOPHEN 500 MG PO TABS
500.0000 mg | ORAL_TABLET | Freq: Four times a day (QID) | ORAL | Status: DC | PRN
  Administered 2014-11-23 – 2014-11-28 (×3): 500 mg via ORAL
  Filled 2014-11-19 (×4): qty 1

## 2014-11-19 MED ORDER — PANTOPRAZOLE SODIUM 40 MG PO TBEC
40.0000 mg | DELAYED_RELEASE_TABLET | Freq: Every day | ORAL | Status: DC
  Administered 2014-11-19 – 2014-11-25 (×7): 40 mg via ORAL
  Filled 2014-11-19 (×7): qty 1

## 2014-11-19 MED ORDER — TRAMADOL HCL 50 MG PO TABS
ORAL_TABLET | ORAL | Status: AC
  Administered 2014-11-19: 50 mg
  Filled 2014-11-19: qty 1

## 2014-11-19 MED ORDER — GABAPENTIN 300 MG PO CAPS
300.0000 mg | ORAL_CAPSULE | Freq: Three times a day (TID) | ORAL | Status: DC
  Administered 2014-11-19 – 2014-11-30 (×32): 300 mg via ORAL
  Filled 2014-11-19 (×34): qty 1

## 2014-11-19 MED ORDER — HYDRALAZINE HCL 25 MG PO TABS: 25.0000 mg | ORAL_TABLET | Freq: Three times a day (TID) | ORAL | Status: DC

## 2014-11-19 MED ORDER — OXYBUTYNIN CHLORIDE 5 MG PO TABS
5.0000 mg | ORAL_TABLET | Freq: Two times a day (BID) | ORAL | Status: DC
  Administered 2014-11-19 – 2014-11-30 (×22): 5 mg via ORAL
  Filled 2014-11-19 (×24): qty 1

## 2014-11-19 MED ORDER — SODIUM CHLORIDE 0.9 % IV SOLN: 250.0000 mL | INTRAVENOUS | Status: DC | PRN

## 2014-11-19 MED ORDER — PRAVASTATIN SODIUM 20 MG PO TABS
20.0000 mg | ORAL_TABLET | Freq: Every day | ORAL | Status: DC
  Administered 2014-11-19 – 2014-11-29 (×10): 20 mg via ORAL
  Filled 2014-11-19 (×10): qty 1

## 2014-11-19 MED ORDER — CLONIDINE HCL 0.1 MG PO TABS
0.1000 mg | ORAL_TABLET | Freq: Two times a day (BID) | ORAL | Status: DC
  Administered 2014-11-19 – 2014-11-24 (×11): 0.1 mg via ORAL
  Filled 2014-11-19 (×12): qty 1

## 2014-11-19 MED ORDER — CITALOPRAM HYDROBROMIDE 20 MG PO TABS
20.0000 mg | ORAL_TABLET | Freq: Every day | ORAL | Status: DC
  Administered 2014-11-19 – 2014-11-30 (×12): 20 mg via ORAL
  Filled 2014-11-19 (×12): qty 1

## 2014-11-19 MED ORDER — GUAIFENESIN ER 600 MG PO TB12
600.0000 mg | ORAL_TABLET | Freq: Two times a day (BID) | ORAL | Status: DC
  Administered 2014-11-19 – 2014-11-30 (×22): 600 mg via ORAL
  Filled 2014-11-19 (×22): qty 1

## 2014-11-19 MED ORDER — TRAMADOL HCL 50 MG PO TABS: 50.0000 mg | ORAL_TABLET | Freq: Three times a day (TID) | ORAL | Status: DC | PRN

## 2014-11-19 MED ORDER — POTASSIUM CHLORIDE CRYS ER 20 MEQ PO TBCR
20.0000 meq | EXTENDED_RELEASE_TABLET | Freq: Once | ORAL | Status: AC
  Administered 2014-11-19: 20 meq via ORAL
  Filled 2014-11-19: qty 1

## 2014-11-19 MED ORDER — AMLODIPINE BESYLATE 5 MG PO TABS
5.0000 mg | ORAL_TABLET | Freq: Every day | ORAL | Status: DC
  Administered 2014-11-19: 5 mg via ORAL
  Filled 2014-11-19: qty 1

## 2014-11-19 MED ORDER — HYDROCHLOROTHIAZIDE 25 MG PO TABS
25.0000 mg | ORAL_TABLET | Freq: Every day | ORAL | Status: DC
  Administered 2014-11-19 – 2014-11-30 (×12): 25 mg via ORAL
  Filled 2014-11-19 (×12): qty 1

## 2014-11-19 MED ORDER — SODIUM CHLORIDE 0.9 % IJ SOLN
3.0000 mL | Freq: Two times a day (BID) | INTRAMUSCULAR | Status: DC
  Administered 2014-11-19 – 2014-11-30 (×21): 3 mL via INTRAVENOUS

## 2014-11-19 MED ORDER — TIZANIDINE HCL 4 MG PO TABS
4.0000 mg | ORAL_TABLET | Freq: Four times a day (QID) | ORAL | Status: DC
  Administered 2014-11-19 – 2014-11-25 (×25): 4 mg via ORAL
  Filled 2014-11-19 (×25): qty 1

## 2014-11-19 MED ORDER — OXYCODONE-ACETAMINOPHEN 5-325 MG PO TABS
2.0000 | ORAL_TABLET | Freq: Four times a day (QID) | ORAL | Status: DC | PRN
  Administered 2014-11-19 – 2014-11-24 (×5): 2 via ORAL
  Filled 2014-11-19 (×5): qty 2

## 2014-11-19 MED ORDER — AMLODIPINE BESYLATE 10 MG PO TABS
10.0000 mg | ORAL_TABLET | Freq: Every day | ORAL | Status: DC
  Administered 2014-11-20 – 2014-11-24 (×5): 10 mg via ORAL
  Filled 2014-11-19 (×6): qty 1

## 2014-11-19 MED ORDER — METHYLPREDNISOLONE SODIUM SUCC 40 MG IJ SOLR
40.0000 mg | Freq: Two times a day (BID) | INTRAMUSCULAR | Status: DC
  Administered 2014-11-19 – 2014-11-20 (×2): 40 mg via INTRAVENOUS
  Filled 2014-11-19 (×2): qty 1

## 2014-11-19 MED ORDER — LOSARTAN POTASSIUM 50 MG PO TABS
50.0000 mg | ORAL_TABLET | Freq: Two times a day (BID) | ORAL | Status: DC
  Administered 2014-11-19 – 2014-11-24 (×11): 50 mg via ORAL
  Filled 2014-11-19 (×11): qty 1

## 2014-11-19 NOTE — Progress Notes (Signed)
Patient continued to complain of pain 10/10 one hour after tramadol was given. Called Dr. Estanislado Pandy and informed him of the situation. MD placed orders for percocet and to d/c tramadol. 2 tablets of percocet given and will re-assess pain in one hour. Nursing staff will continue to monitor. Earleen Reaper, RN

## 2014-11-19 NOTE — Progress Notes (Signed)
Patient's BP 173/102. Administered PRN dose of labetalol IV. Will recheck in 30 minutes. Nursing staff will continue to monitor. Earleen Reaper, RN

## 2014-11-19 NOTE — Progress Notes (Addendum)
Initial Nutrition Assessment   INTERVENTION:   Meals and Snacks: Cater to patient preferences. RD called down for a cup of jello for pt. Medical Food Supplement Therapy: will recommend Mighty Shakes on meal trays TID for added nutrition (each shake provides 300kcals and 9g protein) Coordination of Care: if pt at risk for aspiration, recommend SLP evaulation.   NUTRITION DIAGNOSIS:   Inadequate oral intake related to acute illness as evidenced by per patient/family report, meal completion < 25%.  GOAL:   Patient will meet greater than or equal to 90% of their needs  MONITOR:    (Energy Intake, Anthropometrics, Digestive system, Electrolyte and renal Profile)  REASON FOR ASSESSMENT:   Malnutrition Screening Tool    ASSESSMENT:   Pt admitted with b/l pna. Pt with h/o CVA with left sided hemiparesis.  Past Medical History  Diagnosis Date  . Stroke ([place])   . Hypertension   . TIA (transient ischemic attack)     Diet Order:  Diet 2 gram sodium Room service appropriate?: Yes; Fluid consistency:: Thin    Current Nutrition: Pt reports not eating this am secondary to not feeling well. Pt reports drinking some orange juice and that was all. Pt reports no difficulty swallowing currently.  Food/Nutrition-Related History: Pt husband reports he has been making her meals since her stroke in 2010 and that the pt has been eating 3 meals per day. No supplements such as Ensure PTA reported by pt.   Scheduled Medications:  . amLODipine  10 mg Oral Daily  . antiseptic oral rinse  7 mL Mouth Rinse BID  . azithromycin  500 mg Intravenous QHS  . calcium-vitamin D  2 tablet Oral BID  . carvedilol  3.125 mg Oral BID WC  . cefTRIAXone (ROCEPHIN)  IV  1 g Intravenous QHS  . citalopram  20 mg Oral Daily  . cloNIDine  0.1 mg Oral BID  . dipyridamole-aspirin  1 capsule Oral BID  . enoxaparin (LOVENOX) injection  40 mg Subcutaneous QHS  . gabapentin  300 mg Oral TID  . guaiFENesin  600 mg  Oral BID  . hydrochlorothiazide  25 mg Oral Daily  . losartan  50 mg Oral BID  . oxybutynin  5 mg Oral BID  . pantoprazole  40 mg Oral QAC breakfast  . [START ON 11/20/2014] pneumococcal 23 valent vaccine  0.5 mL Intramuscular Tomorrow-1000  . pravastatin  20 mg Oral q1800  . sodium chloride  3 mL Intravenous Q12H  . tiZANidine  4 mg Oral QID     Electrolyte/Renal Profile and Glucose Profile:   Recent Labs Lab 11/18/14 1821 11/19/14 0417  NA 131* 133*  K 3.3* 3.2*  CL 92* 95*  CO2 31 26  BUN 9 7  CREATININE 0.73 0.64  CALCIUM 10.2 9.9  GLUCOSE 107* 164*   Protein Profile:   Recent Labs Lab 11/18/14 1821  ALBUMIN 4.3    Gastrointestinal Profile: Last BM:  11/16/2014   Nutrition-Focused Physical Exam Findings:  Unable to complete Nutrition-Focused physical exam at this time.    Weight Change: Unable to clarify on visit. Per [facility] weight decrease PTA. Per [facility] last weight from 2011 of 151lbs.   Height:   Ht Readings from Last 1 Encounters:  11/18/14 5\' 1"  (1.549 m)    Weight:   Wt Readings from Last 1 Encounters:  11/18/14 160 lb (72.576 kg)   Wt Readings from Last 10 Encounters:  11/18/14 160 lb (72.576 kg)  06/23/09 151 lb (68.493 kg)  04/20/09 170 lb (77.111 kg)  04/13/09 172 lb 4 oz (78.132 kg)  04/04/09 172 lb 8 oz (78.245 kg)  12/14/08 182 lb (82.555 kg)  11/08/08 183 lb (83.008 kg)  01/05/08 180 lb (81.647 kg)  10/22/07 176 lb (79.833 kg)  10/13/07 174 lb (78.926 kg)    BMI:  Body mass index is 30.25 kg/(m^2).  Estimated Nutritional Needs:   Kcal:  using IBW of 47.7kg, BEE: 914kcals, TEE: (IF 1.1-1.3)(AF 1.2) 1207-1426kcals  Protein:  38-48g protein (0.8-1.0g/kg)  Fluid:  1193-1490mL of fluid (25-33mL/kg)  EDUCATION NEEDS:   No education needs identified at this time   [place], RD, LDN Pager [phone]

## 2014-11-19 NOTE — H&P (Signed)
[place] [facility] [place] NAME: [name]    MR#:  [id number]  DATE OF BIRTH:  July 12, 1940  DATE OF ADMISSION:  11/18/2014  PRIMARY CARE PHYSICIAN: Margaret Body, MD   REQUESTING/REFERRING PHYSICIAN:   CHIEF COMPLAINT:   Chief Complaint  Patient presents with  . Cough    HISTORY OF PRESENT ILLNESS: [name]  is a 74 y.o. female with a known history of CVA with left hemiparesis, hypertension pressure presented to the emergency room with cough and shortness of breath. Patient recently completed 14 days of oral antibiotics prescribed by her primary care physician for pneumonia. Patient after finishing the antibiotics still has shortness of breath and cough. Patient is not able to spit up anything when she coughs. No fever or chills. Has generalized weakness. Patient was worked up in the emergency room and was found to have bilateral pneumonia on chest x-ray. No history of any hemoptysis or any hematemesis. No history of any recent travel or any sick contacts at home. No history of any orthopnea or any paroxysmal nocturnal dyspnea. No complaints of any chest pain.  PAST MEDICAL HISTORY:   Past Medical History  Diagnosis Date  . Stroke ([place])   . Hypertension   . TIA (transient ischemic attack)     PAST SURGICAL HISTORY:  Past Surgical History  Procedure Laterality Date  . Abdominal hysterectomy    . Appendectomy      SOCIAL HISTORY:  Social History  Substance Use Topics  . Smoking status: Never Smoker   . Smokeless tobacco: Not on file  . Alcohol Use: No     Comment: does not drink alcohol    FAMILY HISTORY: History reviewed. No pertinent family history.  DRUG ALLERGIES:  Allergies  Allergen Reactions  . Ace Inhibitors Cough  . Latex Hives  . Tetracycline Other (See Comments)    Reaction:  Unknown     REVIEW OF SYSTEMS:   CONSTITUTIONAL: No fever, fatigue or weakness.  EYES: No blurred or double  vision.  EARS, NOSE, AND THROAT: No tinnitus or ear pain.  RESPIRATORY:  Cough present, shortness of breath present, no wheezing or hemoptysis.  CARDIOVASCULAR: No chest pain, orthopnea, edema.  GASTROINTESTINAL: No nausea, vomiting, diarrhea or abdominal pain.  GENITOURINARY: No dysuria, hematuria.  ENDOCRINE: No polyuria, nocturia,  HEMATOLOGY: No anemia, easy bruising or bleeding SKIN: No rash or lesion. MUSCULOSKELETAL: No joint pain or arthritis.   NEUROLOGIC: No tingling, numbness, weakness.  PSYCHIATRY: No anxiety or depression.   MEDICATIONS AT HOME:  Prior to Admission medications   Medication Sig Start Date End Date Taking? Authorizing Provider  acetaminophen (TYLENOL) 500 MG tablet Take 500 mg by mouth every 6 (six) hours as needed for mild pain.   Yes Historical Provider, MD  amLODipine (NORVASC) 5 MG tablet Take 5 mg by mouth daily.   Yes Historical Provider, MD  Calcium Carbonate-Vitamin D (CALCIUM 600+D) 600-400 MG-UNIT tablet Take 2 tablets by mouth 2 (two) times daily.   Yes Historical Provider, MD  carvedilol (COREG) 3.125 MG tablet Take 3.125 mg by mouth 2 (two) times daily.   Yes Historical Provider, MD  citalopram (CELEXA) 20 MG tablet Take 20 mg by mouth daily.   Yes Historical Provider, MD  dipyridamole-aspirin (AGGRENOX) 200-25 MG 12hr capsule Take 1 capsule by mouth 2 (two) times daily.   Yes Historical Provider, MD  gabapentin (NEURONTIN) 300 MG capsule Take 300 mg by mouth 3 (three) times daily.  Yes Historical Provider, MD  Guaifenesin (MUCINEX MAXIMUM STRENGTH) 1200 MG TB12 Take 1,200 mg by mouth 2 (two) times daily.   Yes Historical Provider, MD  hydrochlorothiazide (HYDRODIURIL) 25 MG tablet Take 25 mg by mouth daily.   Yes Historical Provider, MD  losartan (COZAAR) 50 MG tablet Take 50 mg by mouth 2 (two) times daily.   Yes Historical Provider, MD  lovastatin (MEVACOR) 20 MG tablet Take 20 mg by mouth at bedtime.   Yes Historical Provider, MD  omeprazole  (PRILOSEC) 20 MG capsule Take 20 mg by mouth daily.   Yes Historical Provider, MD  oxybutynin (DITROPAN) 5 MG tablet Take 5 mg by mouth 2 (two) times daily.   Yes Historical Provider, MD  tiZANidine (ZANAFLEX) 4 MG tablet Take 4 mg by mouth 4 (four) times daily.   Yes Historical Provider, MD  traMADol (ULTRAM) 50 MG tablet Take 50 mg by mouth every 8 (eight) hours as needed for moderate pain.   Yes Historical Provider, MD      PHYSICAL EXAMINATION:   VITAL SIGNS: Blood pressure 201/137, pulse 94, temperature 98.2 F (36.8 C), temperature source Oral, resp. rate 19, height 5\' 1"  (1.549 m), weight 72.576 kg (160 lb), SpO2 93 %.  GENERAL:  74 y.o.-year-old patient lying in the bed with no acute distress.  EYES: Pupils equal, round, reactive to light and accommodation. No scleral icterus. Extraocular muscles intact.  HEENT: Head atraumatic, normocephalic. Oropharynx and nasopharynx clear.  NECK:  Supple, no jugular venous distention. No thyroid enlargement, no tenderness.  LUNGS: Decreased breath sounds bilaterally, no wheezing, rales heard bilaterally. No use of accessory muscles of respiration.  CARDIOVASCULAR: S1, S2 normal. No murmurs, rubs, or gallops.  ABDOMEN: Soft, nontender, nondistended. Bowel sounds present. No organomegaly or mass.  EXTREMITIES: No pedal edema, cyanosis, or clubbing. Uses brace to walk for left lower extremity. NEUROLOGIC: Cranial nerves II through XII are intact. Muscle strength 2/5 in left upper extremity and 3/5 left lower extremity.Muscle strength 5/5 tight upper and lower extremity. Sensation intact. Gait not checked.  PSYCHIATRIC: The patient is alert and oriented x 3.  SKIN: No obvious rash, lesion, or ulcer.   LABORATORY PANEL:   CBC  Recent Labs Lab 11/18/14 1821  WBC 7.0  HGB 13.0  HCT 37.2  PLT 220  MCV 90.9  MCH 31.7  MCHC 34.9  RDW 12.3  LYMPHSABS 1.6  MONOABS 0.6  EOSABS 0.3  BASOSABS 0.0    ------------------------------------------------------------------------------------------------------------------  Chemistries   Recent Labs Lab 11/18/14 1821  NA 131*  K 3.3*  CL 92*  CO2 31  GLUCOSE 107*  BUN 9  CREATININE 0.73  CALCIUM 10.2  AST 28  ALT 19  ALKPHOS 50  BILITOT 0.3   ------------------------------------------------------------------------------------------------------------------ estimated creatinine clearance is 56.2 mL/min (by C-G formula based on Cr of 0.73). ------------------------------------------------------------------------------------------------------------------ No results for input(s): TSH, T4TOTAL, T3FREE, THYROIDAB in the last 72 hours.  Invalid input(s): FREET3   Coagulation profile No results for input(s): INR, PROTIME in the last 168 hours. ------------------------------------------------------------------------------------------------------------------- No results for input(s): DDIMER in the last 72 hours. -------------------------------------------------------------------------------------------------------------------  Cardiac Enzymes  Recent Labs Lab 11/18/14 1821  TROPONINI <0.03   ------------------------------------------------------------------------------------------------------------------ Invalid input(s): POCBNP  ---------------------------------------------------------------------------------------------------------------  Urinalysis    Component Value Date/Time   COLORURINE STRAW* 11/18/2014 1949   COLORURINE Yellow 04/11/2013 1520   APPEARANCEUR HAZY* 11/18/2014 1949   APPEARANCEUR Clear 04/11/2013 1520   LABSPEC 1.005 11/18/2014 1949   LABSPEC 1.014 04/11/2013 1520   PHURINE 7.0 11/18/2014 1949  PHURINE 5.0 04/11/2013 1520   GLUCOSEU NEGATIVE 11/18/2014 1949   GLUCOSEU Negative 04/11/2013 1520   HGBUR 1+* 11/18/2014 1949   HGBUR Negative 04/11/2013 1520   BILIRUBINUR NEGATIVE 11/18/2014 1949    BILIRUBINUR Negative 04/11/2013 [place] 11/18/2014 1949   KETONESUR Negative 04/11/2013 [place] 11/18/2014 1949   PROTEINUR Negative 04/11/2013 1520   UROBILINOGEN 0.2 05/02/2009 0958   NITRITE NEGATIVE 11/18/2014 1949   NITRITE Negative 04/11/2013 1520   LEUKOCYTESUR NEGATIVE 11/18/2014 1949   LEUKOCYTESUR Negative 04/11/2013 1520     RADIOLOGY: Dg Chest 2 View  11/18/2014  CLINICAL DATA:  Subacute onset of cough and congestion for 2 weeks. High blood pressure. Initial encounter. EXAM: CHEST  2 VIEW COMPARISON:  Chest radiograph performed 03/10/2014 FINDINGS: The lungs are well-aerated. Mild bibasilar opacities likely reflect atelectasis. Mild vascular congestion is seen. There is no evidence of pleural effusion or pneumothorax. The heart is borderline enlarged. No acute osseous abnormalities are seen. IMPRESSION: Mild bibasilar opacities likely reflect atelectasis. Mild vascular congestion and borderline cardiomegaly noted. Electronically Signed   By: Garald Balding M.D.   On: 11/18/2014 18:17    EKG: Orders placed or performed during the hospital encounter of 11/18/14  . ED EKG  . ED EKG    IMPRESSION AND PLAN: 1. Bilateral pneumonia 2. Cough secondary to pneumonia 3. Uncontrolled hypertension 4. History of CVA with left hemiparesis Plan of management  1. Start patient on IV Rocephin 1 g daily and IV Zithromax 500 MG daily 2. Albuterol nebulization 3. Control blood pressure with oral medications and when necessary IV labetalol. 4. Oxygen via nasal cannula 5. Subcutaneous Lovenox for DVT prophylaxis 6. Check cultures 7.Antitussives as needed.  All the records are reviewed and case discussed with ED provider. Management plans discussed with the patient, family and they are in agreement.  CODE STATUS:Full code    TOTAL TIME TAKING CARE OF THIS PATIENT: 52 minutes minutes.    Saundra Shelling M.D on 11/19/2014 at 12:06 AM  Between 7am  to 6pm - Pager - [phone]  After 6pm go to [email or web address] - password [email or web address] [facility]  Office  [phone]  CC: Primary care physician; Margaret Body, MD

## 2014-11-19 NOTE — Progress Notes (Signed)
Patient's BP 177/91 in right forearm and 178/88 in right upper arm 30 minutes after labetalol. Notified Dr. Estanislado Pandy and received orders to give hydralazine. Patient also still complaining of high pain level and MD to place order for 1mg  of Dilaudid PRN q4h. Nursing staff will continue to monitor. Earleen Reaper, RN

## 2014-11-19 NOTE — Progress Notes (Signed)
Patient arrived to 2A Room 233. Patient scores pain 10/10 and tramadol given; all questions answered. Patient oriented to unit and Fall Safety Plan signed. Skin assessment completed with Lanette Hampshire. Skin intact with minor blanchable redness noted on both buttocks Nursing staff will continue to monitor. Earleen Reaper, RN

## 2014-11-19 NOTE — Progress Notes (Signed)
[name] at [place] NAME: [name]    MR#:  [id number]  DATE OF BIRTH:  03/10/1940  SUBJECTIVE: Patient is a admitted for bilateral pneumonia. Complains of left arm pain. Cough is still there but unable to get the phlegm out.   CHIEF COMPLAINT:   Chief Complaint  Patient presents with  . Cough    REVIEW OF SYSTEMS:    Review of Systems  Constitutional: Negative for fever and chills.  HENT: Negative for congestion, hearing loss and tinnitus.   Eyes: Negative for blurred vision, double vision and photophobia.  Respiratory: Positive for cough and shortness of breath. Negative for hemoptysis.   Cardiovascular: Negative for palpitations, orthopnea and leg swelling.  Gastrointestinal: Negative for vomiting, abdominal pain and diarrhea.  Genitourinary: Negative for dysuria and urgency.  Musculoskeletal: Negative for myalgias and neck pain.  Skin: Negative for rash.  Neurological: Negative for dizziness, focal weakness, seizures, weakness and headaches.  Psychiatric/Behavioral: Negative for memory loss. The patient does not have insomnia.     Nutrition:  Tolerating Diet: Tolerating PT:      DRUG ALLERGIES:   Allergies  Allergen Reactions  . Ace Inhibitors Cough  . Latex Hives  . Tetracycline Other (See Comments)    Reaction:  Unknown     VITALS:  Blood pressure 157/85, pulse 100, temperature 97.4 F (36.3 C), temperature source Axillary, resp. rate 16, height 5\' 1"  (1.549 m), weight 72.576 kg (160 lb), SpO2 90 %.  PHYSICAL EXAMINATION:   Physical Exam  GENERAL:  74 y.o.-year-old patient lying in the bed with no acute distress.  EYES: Pupils equal, round, reactive to light and accommodation. No scleral icterus. Extraocular muscles intact.  HEENT: Head atraumatic, normocephalic. Oropharynx and nasopharynx clear.  NECK:  Supple, no jugular venous distention. No thyroid enlargement, no tenderness.  LUNGS:  Normal breath sounds bilaterally, no wheezing, rales,rhonchi or crepitation. No use of accessory muscles of respiration.  CARDIOVASCULAR: S1, S2 normal. No murmurs, rubs, or gallops.  ABDOMEN: Soft, nontender, nondistended. Bowel sounds present. No organomegaly or mass.  EXTREMITIES: No pedal edema, cyanosis, or clubbing.  NEUROLOGIc; left arm weakness and left leg weakness secondary to old stroke. PSYCHIATRIC: The patient is alert and oriented x 3.  SKIN: No obvious rash, lesion, or ulcer.    LABORATORY PANEL:   CBC  Recent Labs Lab 11/19/14 0417  WBC 5.9  HGB 13.5  HCT 38.6  PLT 223   ------------------------------------------------------------------------------------------------------------------  Chemistries   Recent Labs Lab 11/18/14 1821 11/19/14 0417  NA 131* 133*  K 3.3* 3.2*  CL 92* 95*  CO2 31 26  GLUCOSE 107* 164*  BUN 9 7  CREATININE 0.73 0.64  CALCIUM 10.2 9.9  AST 28  --   ALT 19  --   ALKPHOS 50  --   BILITOT 0.3  --    ------------------------------------------------------------------------------------------------------------------  Cardiac Enzymes  Recent Labs Lab 11/18/14 1821  TROPONINI <0.03   ------------------------------------------------------------------------------------------------------------------  RADIOLOGY:  Dg Chest 2 View  11/18/2014  CLINICAL DATA:  Subacute onset of cough and congestion for 2 weeks. High blood pressure. Initial encounter. EXAM: CHEST  2 VIEW COMPARISON:  Chest radiograph performed 03/10/2014 FINDINGS: The lungs are well-aerated. Mild bibasilar opacities likely reflect atelectasis. Mild vascular congestion is seen. There is no evidence of pleural effusion or pneumothorax. The heart is borderline enlarged. No acute osseous abnormalities are seen. IMPRESSION: Mild bibasilar opacities likely reflect atelectasis. Mild vascular congestion and borderline cardiomegaly noted.  Electronically Signed   By: Garald Balding  M.D.   On: 11/18/2014 18:17     ASSESSMENT AND PLAN:   Principal Problem:   Community acquired pneumonia Active Problems:   Uncontrolled hypertension   Pneumonia  #1 community-acquired pneumonia: Patient is on IV Rocephin, Zithromax. Follow blood cultures positive and CHF on chest x-ray but the patient clinically looks like she has pneumonia. Obtain echocardiogram. Evaluate for LV function..  #2 uncontrolled hypertension: Continue amlodipine, Coreg, clonidine.   History of CVA with left-sided hemiplegia: Continue Aggrenox, statins   #4. Left arm pain secondary to history of stroke before continue Percocet as needed. Physical therapy evaluation.    All the records are reviewed and case discussed with Care Management/Social Workerr. Management plans discussed with the patient, family and they are in agreement.  CODE STATUS:full  TOTAL TIME TAKING CARE OF THIS PATIENT: 45minutes.   POSSIBLE D/C IN 1-2  DAYS, DEPENDING ON CLINICAL CONDITION.   Epifanio Lesches M.D on 11/19/2014 at 12:34 PM  Between 7am to 6pm - Pager - [phone]  After 6pm go to [email or web address] - password [email or web address] [facility]  Office  [phone]  CC: Primary care physician; [name], MD

## 2014-11-19 NOTE — Progress Notes (Signed)
MD notified. Pt is audibly wheezing and seems short of breath with oxygen saturation >92% MD orders received. Will continue to assess.

## 2014-11-20 ENCOUNTER — Inpatient Hospital Stay (HOSPITAL_COMMUNITY)
Admit: 2014-11-20 | Discharge: 2014-11-20 | Disposition: A | Discharge status: Home / Self Care | Payer: PPO | Attending: Internal Medicine | Admitting: Internal Medicine

## 2014-11-20 ENCOUNTER — Inpatient Hospital Stay: Payer: PPO

## 2014-11-20 DIAGNOSIS — R079 Chest pain, unspecified: Secondary | ICD-10-CM

## 2014-11-20 MED ORDER — METHYLPREDNISOLONE SODIUM SUCC 125 MG IJ SOLR
60.0000 mg | Freq: Four times a day (QID) | INTRAMUSCULAR | Status: DC
  Administered 2014-11-20 – 2014-11-22 (×9): 60 mg via INTRAVENOUS
  Filled 2014-11-20 (×9): qty 2

## 2014-11-20 MED ORDER — POTASSIUM CHLORIDE 20 MEQ/15ML (10%) PO SOLN
40.0000 meq | Freq: Once | ORAL | Status: AC
Start: 2014-11-20 — End: 2014-11-20
  Administered 2014-11-20: 40 meq via ORAL
  Filled 2014-11-20: qty 30

## 2014-11-20 NOTE — Progress Notes (Signed)
[place] [facility] [place] NAME: [name]    MR#:  [id number]  DATE OF BIRTH:  [birth year]  SUBJECTIVE: Patient is a admitted for bilateral pneumonia.has audible wheezing.  CHIEF COMPLAINT:   Chief Complaint  Patient presents with  . Cough    REVIEW OF SYSTEMS:    Review of Systems  Constitutional: Negative for fever and chills.  HENT: Negative for congestion, hearing loss and tinnitus.   Eyes: Negative for blurred vision, double vision and photophobia.  Respiratory: Positive for cough, shortness of breath and wheezing. Negative for hemoptysis.   Cardiovascular: Negative for palpitations, orthopnea and leg swelling.  Gastrointestinal: Negative for vomiting, abdominal pain and diarrhea.  Genitourinary: Negative for dysuria and urgency.  Musculoskeletal: Negative for myalgias and neck pain.  Skin: Negative for rash.  Neurological: Negative for dizziness, focal weakness, seizures, weakness and headaches.  Psychiatric/Behavioral: Negative for memory loss. The patient does not have insomnia.     Nutrition:  Tolerating Diet: Tolerating PT:      DRUG ALLERGIES:   Allergies  Allergen Reactions  . Ace Inhibitors Cough  . Latex Hives  . Tetracycline Other (See Comments)    Reaction:  Unknown     VITALS:  Blood pressure 172/77, pulse 94, temperature 99 F (37.2 C), temperature source Oral, resp. rate 15, height 5\' 1"  (1.549 m), weight 72.576 kg (160 lb), SpO2 93 %.  PHYSICAL EXAMINATION:   Physical Exam  GENERAL:  74 y.o.-year-old patient lying in the bed with no acute distress.  EYES: Pupils equal, round, reactive to light and accommodation. No scleral icterus. Extraocular muscles intact.  HEENT: Head atraumatic, normocephalic. Oropharynx and nasopharynx clear.  NECK:  Supple, no jugular venous distention. No thyroid enlargement, no tenderness.  LUNGS:  Bilateral expiratory wheezing all lung fields. Not using  accessory muscles of respiration. CARDIOVASCULAR: S1, S2 normal. No murmurs, rubs, or gallops.  ABDOMEN: Soft, nontender, nondistended. Bowel sounds present. No organomegaly or mass.  EXTREMITIES: No pedal edema, cyanosis, or clubbing.  NEUROLOGIc; left arm weakness and left leg weakness secondary to old stroke. PSYCHIATRIC: The patient is alert and oriented x 3.  SKIN: No obvious rash, lesion, or ulcer.    LABORATORY PANEL:   CBC  Recent Labs Lab 11/19/14 0417  WBC 5.9  HGB 13.5  HCT 38.6  PLT 223   ------------------------------------------------------------------------------------------------------------------  Chemistries   Recent Labs Lab 11/18/14 1821 11/19/14 0417  NA 131* 133*  K 3.3* 3.2*  CL 92* 95*  CO2 31 26  GLUCOSE 107* 164*  BUN 9 7  CREATININE 0.73 0.64  CALCIUM 10.2 9.9  AST 28  --   ALT 19  --   ALKPHOS 50  --   BILITOT 0.3  --    ------------------------------------------------------------------------------------------------------------------  Cardiac Enzymes  Recent Labs Lab 11/18/14 1821  TROPONINI <0.03   ------------------------------------------------------------------------------------------------------------------  RADIOLOGY:  Dg Chest 2 View  11/18/2014  CLINICAL DATA:  Subacute onset of cough and congestion for 2 weeks. High blood pressure. Initial encounter. EXAM: CHEST  2 VIEW COMPARISON:  Chest radiograph performed 03/10/2014 FINDINGS: The lungs are well-aerated. Mild bibasilar opacities likely reflect atelectasis. Mild vascular congestion is seen. There is no evidence of pleural effusion or pneumothorax. The heart is borderline enlarged. No acute osseous abnormalities are seen. IMPRESSION: Mild bibasilar opacities likely reflect atelectasis. Mild vascular congestion and borderline cardiomegaly noted. Electronically Signed   By: Garald Balding M.D.   On: 11/18/2014 18:17  ASSESSMENT AND PLAN:   Principal Problem:    Community acquired pneumonia Active Problems:   Uncontrolled hypertension   Pneumonia  #1 community-acquired pneumonia: Patient is on IV Rocephin, Zithromax.   Bruising likely secondary to reactive airway disease: Continue albuterol, Solu-Medrol, repeat chest x-ray today. Follow blood cultures positive and CHF on chest x-ray but the patient clinically looks like she has pneumonia. Obtain echocardiogram. Evaluate for LV function..  #2 uncontrolled hypertension: Continue amlodipine, Coreg, clonidine.   History of CVA with left-sided hemiplegia: Continue Aggrenox, statins   #4. Left arm pain secondary to history of stroke before continue Percocet as needed. Physical therapy evaluation.    All the records are reviewed and case discussed with Care Management/Social Workerr. Management plans discussed with the patient, family and they are in agreement.  CODE STATUS:full  TOTAL TIME TAKING CARE OF THIS PATIENT: 89minutes.   POSSIBLE D/C IN 1-2  DAYS, DEPENDING ON CLINICAL CONDITION.   Epifanio Lesches M.D on 11/20/2014 at 11:13 AM  Between 7am to 6pm - Pager - [phone]  After 6pm go to [email or web address] - password [email or web address] [facility]  Office  [phone]  CC: Primary care physician; [name], MD

## 2014-11-20 NOTE — Progress Notes (Signed)
*  PRELIMINARY RESULTS* Echocardiogram 2D Echocardiogram has been performed.  [name] 11/20/2014, 2:53 PM

## 2014-11-21 LAB — LEGIONELLA PNEUMOPHILA SEROGP 1 UR AG: L. pneumophila Serogp 1 Ur Ag: NEGATIVE

## 2014-11-21 LAB — POTASSIUM: Potassium: 3.3 mmol/L — ABNORMAL LOW (ref 3.5–5.1)

## 2014-11-21 LAB — MAGNESIUM: MAGNESIUM: 1.9 mg/dL (ref 1.7–2.4)

## 2014-11-21 MED ORDER — POTASSIUM CHLORIDE CRYS ER 20 MEQ PO TBCR
40.0000 meq | EXTENDED_RELEASE_TABLET | Freq: Once | ORAL | Status: AC
  Administered 2014-11-21: 40 meq via ORAL
  Filled 2014-11-21: qty 2

## 2014-11-21 MED ORDER — POTASSIUM CHLORIDE CRYS ER 20 MEQ PO TBCR
20.0000 meq | EXTENDED_RELEASE_TABLET | Freq: Every day | ORAL | Status: AC
  Administered 2014-11-21: 20 meq via ORAL
  Filled 2014-11-21: qty 1

## 2014-11-21 MED ORDER — HYDRALAZINE HCL 50 MG PO TABS
50.0000 mg | ORAL_TABLET | Freq: Three times a day (TID) | ORAL | Status: DC
  Administered 2014-11-22: 50 mg via ORAL
  Filled 2014-11-21: qty 1

## 2014-11-21 MED ORDER — ALPRAZOLAM 0.5 MG PO TABS
0.5000 mg | ORAL_TABLET | Freq: Three times a day (TID) | ORAL | Status: DC | PRN
  Administered 2014-11-21 – 2014-11-25 (×4): 0.5 mg via ORAL
  Filled 2014-11-21 (×4): qty 1

## 2014-11-21 MED ORDER — AZITHROMYCIN 250 MG PO TABS
500.0000 mg | ORAL_TABLET | Freq: Every day | ORAL | Status: DC
  Administered 2014-11-21 – 2014-11-23 (×3): 500 mg via ORAL
  Filled 2014-11-21 (×3): qty 2

## 2014-11-21 NOTE — Care Management Important Message (Signed)
Important Message  Patient Details  Name: Yerin Dimitriou MRN: [id number] Date of Birth: 1940/02/22   Medicare Important Message Given:  Yes    Beverly Sessions, RN 11/21/2014, 9:18 AM

## 2014-11-21 NOTE — Progress Notes (Signed)
[place] [facility] [place] NAME: [name]    MR#:  [id number]  DATE OF BIRTH:  [birth year]  SUBJECTIVE: very anxious today.BP high.less  Wheezing .  CHIEF COMPLAINT:   Chief Complaint  Patient presents with  . Cough    REVIEW OF SYSTEMS:    Review of Systems  Constitutional: Negative for fever and chills.  HENT: Negative for congestion, hearing loss and tinnitus.   Eyes: Negative for blurred vision, double vision and photophobia.  Respiratory: Positive for cough, shortness of breath and wheezing. Negative for hemoptysis.   Cardiovascular: Negative for palpitations, orthopnea and leg swelling.  Gastrointestinal: Negative for vomiting, abdominal pain and diarrhea.  Genitourinary: Negative for dysuria and urgency.  Musculoskeletal: Negative for myalgias and neck pain.  Skin: Negative for rash.  Neurological: Negative for dizziness, focal weakness, seizures, weakness and headaches.  Psychiatric/Behavioral: Negative for memory loss. The patient does not have insomnia.     Nutrition:  Tolerating Diet: Tolerating PT:      DRUG ALLERGIES:   Allergies  Allergen Reactions  . Ace Inhibitors Cough  . Latex Hives  . Tetracycline Other (See Comments)    Reaction:  Unknown     VITALS:  Blood pressure 182/74, pulse 96, temperature 98.5 F (36.9 C), temperature source Oral, resp. rate 18, height 5\' 1"  (1.549 m), weight 72.576 kg (160 lb), SpO2 95 %.  PHYSICAL EXAMINATION:   Physical Exam  GENERAL:  74 y.o.-year-old patient lying in the bed with no acute distress.  EYES: Pupils equal, round, reactive to light and accommodation. No scleral icterus. Extraocular muscles intact.  HEENT: Head atraumatic, normocephalic. Oropharynx and nasopharynx clear.  NECK:  Supple, no jugular venous distention. No thyroid enlargement, no tenderness.  LUNGS:  Bilateral expiratory wheezing all lung fields better  than yesterday.. Not using  accessory muscles of respiration. CARDIOVASCULAR: S1, S2 normal. No murmurs, rubs, or gallops.  ABDOMEN: Soft, nontender, nondistended. Bowel sounds present. No organomegaly or mass.  EXTREMITIES: No pedal edema, cyanosis, or clubbing.  NEUROLOGIc; left arm weakness and left leg weakness secondary to old stroke. PSYCHIATRIC: The patient is alert and oriented x 3.  SKIN: No obvious rash, lesion, or ulcer.    LABORATORY PANEL:   CBC  Recent Labs Lab 11/19/14 0417  WBC 5.9  HGB 13.5  HCT 38.6  PLT 223   ------------------------------------------------------------------------------------------------------------------  Chemistries   Recent Labs Lab 11/18/14 1821 11/19/14 0417  NA 131* 133*  K 3.3* 3.2*  CL 92* 95*  CO2 31 26  GLUCOSE 107* 164*  BUN 9 7  CREATININE 0.73 0.64  CALCIUM 10.2 9.9  AST 28  --   ALT 19  --   ALKPHOS 50  --   BILITOT 0.3  --    ------------------------------------------------------------------------------------------------------------------  Cardiac Enzymes  Recent Labs Lab 11/18/14 1821  TROPONINI <0.03   ------------------------------------------------------------------------------------------------------------------  RADIOLOGY:  Dg Chest 1 View  11/20/2014  CLINICAL DATA:  74 year old female with cough, shortness of breath and wheezing. EXAM: CHEST 1 VIEW COMPARISON:  11/18/2014 and prior radiographs FINDINGS: Cardiomegaly identified. There is no evidence of focal airspace disease, pulmonary edema, suspicious pulmonary nodule/mass, pleural effusion, or pneumothorax. No acute bony abnormalities are identified. IMPRESSION: Cardiomegaly without evidence of acute cardiopulmonary disease. Electronically Signed   By: Margarette Canada M.D.   On: 11/20/2014 15:32     ASSESSMENT AND PLAN:   Principal Problem:   Community acquired pneumonia Active Problems:   Uncontrolled hypertension  Pneumonia  #1 community-acquired pneumonia: Patient is  on IV Rocephin, Zithromax. Clinically improving slowly.  wheezing likely secondary to reactive airway disease: Continue albuterol, Solu-Medrol,  Follow blood cultures positive and CHF on chest x-ray but the patient clinically looks like she has pneumonia. Echo is done but the results are pending.  #2 uncontrolled hypertension: Continue amlodipine, Coreg, clonidine. She has anxiety so gave  Xanax.and use the when necessary labetalol.  History of CVA with left-sided hemiplegia: Continue Aggrenox, statins   #4. Left arm pain secondary to history of stroke before continue Percocet as needed. Physical therapy evaluation.    All the records are reviewed and case discussed with Care Management/Social Workerr. Management plans discussed with the patient, family and they are in agreement.  CODE STATUS:full  TOTAL TIME TAKING CARE OF THIS PATIENT: 18minutes.   POSSIBLE D/C IN 1-2  DAYS, DEPENDING ON CLINICAL CONDITION.   Epifanio Lesches M.D on 11/21/2014 at 1:19 PM  Between 7am to 6pm - Pager - [phone]  After 6pm go to [email or web address] - password [email or web address] [facility]  Office  [phone]  CC: Primary care physician; [name], MD

## 2014-11-21 NOTE — Care Management Note (Signed)
Case Management Note  Patient Details  Name: [name] MRN: [id number] Date of Birth: 1940/08/28  Subjective/Objective:          Patient admitted from home with bilateral PNA.  Patient lives at home with her husband.  Patient obtains her medications via mail scripts.  Patient uses a quad cane for ambulation. Patient's husband drives and transports patient when needed.  Patient is SOB during assessment.  Patient is requiring acute O2 at this time.            Action/Plan: If patient requires O2 at time of discharge will need qualifying O2 sats and diagnosis.   Expected Discharge Date:                  Expected Discharge Plan:     In-House Referral:     Discharge planning Services     Post Acute Care Choice:    Choice offered to:     DME Arranged:    DME Agency:     HH Arranged:    [place] Agency:     Status of Service:     Medicare Important Message Given:  Yes Date Medicare IM Given:    Medicare IM give by:    Date Additional Medicare IM Given:    Additional Medicare Important Message give by:     If discussed at [place] of Stay Meetings, dates discussed:    Additional Comments:  Beverly Sessions, RN 11/21/2014, 10:58 AM

## 2014-11-21 NOTE — Consult Note (Signed)
MEDICATION RELATED CONSULT NOTE - INITIAL   Pharmacy Consult for electrolytes Indication: hypokalemia  Allergies  Allergen Reactions  . Ace Inhibitors Cough  . Latex Hives  . Tetracycline Other (See Comments)    Reaction:  Unknown     Patient Measurements: Height: 5\' 1"  (154.9 cm) Weight: 160 lb (72.576 kg) IBW/kg (Calculated) : 47.8 Adjusted Body Weight:   Vital Signs: BP: 109/58 mmHg (11/14 1643) Pulse Rate: 75 (11/14 1643) Intake/Output from previous day: 11/13 0701 - 11/14 0700 In: 720 [P.O.:720] Out: 1000 [Urine:1000] Intake/Output from this shift:    Labs:  Recent Labs  11/19/14 0417 11/21/14 1310  WBC 5.9  --   HGB 13.5  --   HCT 38.6  --   PLT 223  --   CREATININE 0.64  --   MG  --  1.9   Estimated Creatinine Clearance: 55.3 mL/min (by C-G formula based on Cr of 0.64).   Microbiology: Recent Results (from the past 720 hour(s))  Blood culture (routine x 2)     Status: None (Preliminary result)   Collection Time: 11/18/14  8:09 PM  Result Value Ref Range Status   Specimen Description BLOOD RIGHT WRIST  Final   Special Requests BOTTLES DRAWN AEROBIC AND ANAEROBIC 4CC  Final   Culture NO GROWTH 3 DAYS  Final   Report Status PENDING  Incomplete  Blood culture (routine x 2)     Status: None (Preliminary result)   Collection Time: 11/18/14  8:56 PM  Result Value Ref Range Status   Specimen Description BLOOD RIGHT ASSIST CONTROL  Final   Special Requests BOTTLES DRAWN AEROBIC AND ANAEROBIC 5CC  Final   Culture NO GROWTH 3 DAYS  Final   Report Status PENDING  Incomplete    Medical History: Past Medical History  Diagnosis Date  . Stroke ([place])   . Hypertension   . TIA (transient ischemic attack)     Medications:  Scheduled:  . amLODipine  10 mg Oral Daily  . antiseptic oral rinse  7 mL Mouth Rinse BID  . azithromycin  500 mg Oral QHS  . calcium-vitamin D  2 tablet Oral BID  . carvedilol  3.125 mg Oral BID WC  . cefTRIAXone (ROCEPHIN)  IV  1 g  Intravenous QHS  . citalopram  20 mg Oral Daily  . cloNIDine  0.1 mg Oral BID  . dipyridamole-aspirin  1 capsule Oral BID  . enoxaparin (LOVENOX) injection  40 mg Subcutaneous QHS  . gabapentin  300 mg Oral TID  . guaiFENesin  600 mg Oral BID  . hydrALAZINE  50 mg Oral 3 times per day  . hydrochlorothiazide  25 mg Oral Daily  . ipratropium-albuterol  3 mL Nebulization Q4H  . losartan  50 mg Oral BID  . methylPREDNISolone (SOLU-MEDROL) injection  60 mg Intravenous Q6H  . oxybutynin  5 mg Oral BID  . pantoprazole  40 mg Oral QAC breakfast  . pneumococcal 23 valent vaccine  0.5 mL Intramuscular Tomorrow-1000  . potassium chloride  20 mEq Oral QHS  . pravastatin  20 mg Oral q1800  . sodium chloride  3 mL Intravenous Q12H  . tiZANidine  4 mg Oral QID    Assessment: Pt is a 74 year old female with hypokalemia. Pt was given 40MEQ of K yesterday. K only increased from 3.2 to 3.3. Pt is not on any diuretics. Pt is on losartan which is K sparing.   Goal of Therapy:  K= 3.5-5  Plan:  Will  give 40 MEQ of K now and 20 Meq at bedtime. Will also check mag level. Recheck K level in the AM.  11/14 pm:  Mag level 1.9 - no supplementation needed at this time.   Rayna Sexton L 11/21/2014,7:10 PM

## 2014-11-21 NOTE — Progress Notes (Signed)
PHARMACIST - PHYSICIAN COMMUNICATION DR:    CONCERNING: Antibiotic IV to Oral Route Change Policy  RECOMMENDATION: This patient is receiving azithromycin by the intravenous route.  Based on criteria approved by the Pharmacy and Therapeutics Committee, the antibiotic(s) is/are being converted to the equivalent oral dose form(s).   DESCRIPTION: These criteria include:  Patient being treated for a respiratory tract infection, urinary tract infection, cellulitis or clostridium difficile associated diarrhea if on metronidazole  The patient is not neutropenic and does not exhibit a GI malabsorption state  The patient is eating (either orally or via tube) and/or has been taking other orally administered medications for a least 24 hours  The patient is improving clinically and has a Tmax < 100.5  If you have questions about this conversion, please contact the Pharmacy Department  []   [phone] )  [facility] [x]   [phone] )  [facility] []   [phone] )  [facility] []   [phone] )  [facility] []   [phone] )  [facility]

## 2014-11-21 NOTE — Consult Note (Signed)
MEDICATION RELATED CONSULT NOTE - INITIAL   Pharmacy Consult for electrolytes Indication: hypokalemia  Allergies  Allergen Reactions  . Ace Inhibitors Cough  . Latex Hives  . Tetracycline Other (See Comments)    Reaction:  Unknown     Patient Measurements: Height: 5\' 1"  (154.9 cm) Weight: 160 lb (72.576 kg) IBW/kg (Calculated) : 47.8 Adjusted Body Weight:   Vital Signs: Temp: 98.5 F (36.9 C) (11/14 0457) Temp Source: Oral (11/14 0457) BP: 114/62 mmHg (11/14 1425) Pulse Rate: 79 (11/14 1425) Intake/Output from previous day: 11/13 0701 - 11/14 0700 In: 720 [P.O.:720] Out: 1000 [Urine:1000] Intake/Output from this shift: Total I/O In: 720 [P.O.:720] Out: 1 [Urine:1]  Labs:  Recent Labs  11/18/14 1821 11/19/14 0417  WBC 7.0 5.9  HGB 13.0 13.5  HCT 37.2 38.6  PLT 220 223  CREATININE 0.73 0.64  ALBUMIN 4.3  --   PROT 7.4  --   AST 28  --   ALT 19  --   ALKPHOS 50  --   BILITOT 0.3  --   BILIDIR <0.1*  --   IBILI NOT CALCULATED  --    Estimated Creatinine Clearance: 55.3 mL/min (by C-G formula based on Cr of 0.64).   Microbiology: Recent Results (from the past 720 hour(s))  Blood culture (routine x 2)     Status: None (Preliminary result)   Collection Time: 11/18/14  8:09 PM  Result Value Ref Range Status   Specimen Description BLOOD RIGHT WRIST  Final   Special Requests BOTTLES DRAWN AEROBIC AND ANAEROBIC 4CC  Final   Culture NO GROWTH 3 DAYS  Final   Report Status PENDING  Incomplete  Blood culture (routine x 2)     Status: None (Preliminary result)   Collection Time: 11/18/14  8:56 PM  Result Value Ref Range Status   Specimen Description BLOOD RIGHT ASSIST CONTROL  Final   Special Requests BOTTLES DRAWN AEROBIC AND ANAEROBIC 5CC  Final   Culture NO GROWTH 3 DAYS  Final   Report Status PENDING  Incomplete    Medical History: Past Medical History  Diagnosis Date  . Stroke ([place])   . Hypertension   . TIA (transient ischemic attack)      Medications:  Scheduled:  . amLODipine  10 mg Oral Daily  . antiseptic oral rinse  7 mL Mouth Rinse BID  . azithromycin  500 mg Oral QHS  . calcium-vitamin D  2 tablet Oral BID  . carvedilol  3.125 mg Oral BID WC  . cefTRIAXone (ROCEPHIN)  IV  1 g Intravenous QHS  . citalopram  20 mg Oral Daily  . cloNIDine  0.1 mg Oral BID  . dipyridamole-aspirin  1 capsule Oral BID  . enoxaparin (LOVENOX) injection  40 mg Subcutaneous QHS  . gabapentin  300 mg Oral TID  . guaiFENesin  600 mg Oral BID  . hydrALAZINE  50 mg Oral 3 times per day  . hydrochlorothiazide  25 mg Oral Daily  . ipratropium-albuterol  3 mL Nebulization Q4H  . losartan  50 mg Oral BID  . methylPREDNISolone (SOLU-MEDROL) injection  60 mg Intravenous Q6H  . oxybutynin  5 mg Oral BID  . pantoprazole  40 mg Oral QAC breakfast  . pneumococcal 23 valent vaccine  0.5 mL Intramuscular Tomorrow-1000  . potassium chloride  20 mEq Oral QHS  . potassium chloride  40 mEq Oral Once  . pravastatin  20 mg Oral q1800  . sodium chloride  3 mL Intravenous Q12H  .  tiZANidine  4 mg Oral QID    Assessment: Pt is a 74 year old female with hypokalemia. Pt was given 40MEQ of K yesterday. K only increased from 3.2 to 3.3. Pt is not on any diuretics. Pt is on losartan which is K sparing.   Goal of Therapy:  K= 3.5-5  Plan:  Will give 35 MEQ of K now and 20 Meq at bedtime. Will also check mag level. Recheck K level in the AM.  Lenna Sciara D [place] Ponder 11/21/2014,3:14 PM

## 2014-11-21 NOTE — Progress Notes (Signed)
Notified Dr. Vianne Bulls of BP 109/58 HR 75. Per MD hold coreg due to low BP

## 2014-11-21 NOTE — Progress Notes (Signed)
[name] held in previous shift due to low BP. Patient scheduled to receive catapres, losartan and hydralazine at bedtime for BP. Last BP 127/66. MD [name] notified. MD order to hold losartan and hydralazine. Will continue to monitor.   [name]

## 2014-11-22 LAB — POTASSIUM: POTASSIUM: 3.5 mmol/L (ref 3.5–5.1)

## 2014-11-22 LAB — GLUCOSE, CAPILLARY: GLUCOSE-CAPILLARY: 142 mg/dL — AB (ref 65–99)

## 2014-11-22 MED ORDER — METHYLPREDNISOLONE SODIUM SUCC 125 MG IJ SOLR
60.0000 mg | Freq: Two times a day (BID) | INTRAMUSCULAR | Status: DC
  Administered 2014-11-22 – 2014-11-24 (×5): 60 mg via INTRAVENOUS
  Filled 2014-11-22 (×5): qty 2

## 2014-11-22 NOTE — Consult Note (Signed)
MEDICATION RELATED CONSULT NOTE - INITIAL   Pharmacy Consult for electrolytes Indication: hypokalemia  Allergies  Allergen Reactions  . Ace Inhibitors Cough  . Latex Hives  . Tetracycline Other (See Comments)    Reaction:  Unknown     Patient Measurements: Height: 5\' 1"  (154.9 cm) Weight: 160 lb (72.576 kg) IBW/kg (Calculated) : 47.8 Adjusted Body Weight:   Vital Signs: Temp: 98.5 F (36.9 C) (11/15 0410) Temp Source: Oral (11/15 0410) BP: 174/79 mmHg (11/15 0410) Pulse Rate: 86 (11/15 0410) Intake/Output from previous day: 11/14 0701 - 11/15 0700 In: 960 [P.O.:960] Out: 1201 [Urine:1201] Intake/Output from this shift: Total I/O In: -  Out: 1200 [Urine:1200]  Labs:  Recent Labs  11/21/14 1310  MG 1.9   Estimated Creatinine Clearance: 55.3 mL/min (by C-G formula based on Cr of 0.64).   Microbiology: Recent Results (from the past 720 hour(s))  Blood culture (routine x 2)     Status: None (Preliminary result)   Collection Time: 11/18/14  8:09 PM  Result Value Ref Range Status   Specimen Description BLOOD RIGHT WRIST  Final   Special Requests BOTTLES DRAWN AEROBIC AND ANAEROBIC 4CC  Final   Culture NO GROWTH 3 DAYS  Final   Report Status PENDING  Incomplete  Blood culture (routine x 2)     Status: None (Preliminary result)   Collection Time: 11/18/14  8:56 PM  Result Value Ref Range Status   Specimen Description BLOOD RIGHT ASSIST CONTROL  Final   Special Requests BOTTLES DRAWN AEROBIC AND ANAEROBIC 5CC  Final   Culture NO GROWTH 3 DAYS  Final   Report Status PENDING  Incomplete    Medical History: Past Medical History  Diagnosis Date  . Stroke ([place])   . Hypertension   . TIA (transient ischemic attack)     Medications:  Scheduled:  . amLODipine  10 mg Oral Daily  . antiseptic oral rinse  7 mL Mouth Rinse BID  . azithromycin  500 mg Oral QHS  . calcium-vitamin D  2 tablet Oral BID  . carvedilol  3.125 mg Oral BID WC  . cefTRIAXone (ROCEPHIN)  IV   1 g Intravenous QHS  . citalopram  20 mg Oral Daily  . cloNIDine  0.1 mg Oral BID  . dipyridamole-aspirin  1 capsule Oral BID  . enoxaparin (LOVENOX) injection  40 mg Subcutaneous QHS  . gabapentin  300 mg Oral TID  . guaiFENesin  600 mg Oral BID  . hydrALAZINE  50 mg Oral 3 times per day  . hydrochlorothiazide  25 mg Oral Daily  . ipratropium-albuterol  3 mL Nebulization Q4H  . losartan  50 mg Oral BID  . methylPREDNISolone (SOLU-MEDROL) injection  60 mg Intravenous Q6H  . oxybutynin  5 mg Oral BID  . pantoprazole  40 mg Oral QAC breakfast  . pneumococcal 23 valent vaccine  0.5 mL Intramuscular Tomorrow-1000  . pravastatin  20 mg Oral q1800  . sodium chloride  3 mL Intravenous Q12H  . tiZANidine  4 mg Oral QID    Assessment: Pt is a 74 year old female with hypokalemia. Pt was given 40MEQ of K yesterday. K only increased from 3.2 to 3.3. Pt is not on any diuretics. Pt is on losartan which is K sparing.   Goal of Therapy:  K= 3.5-5  Plan:  Will give 54 MEQ of K now and 20 Meq at bedtime. Will also check mag level. Recheck K level in the AM.  11/14 pm:  Mag level 1.9 - no supplementation needed at this time.   11/15 K+ 3.5. WNL. No supplementation ordered. Recheck in AM.   [name] S 11/22/2014,6:59 AM

## 2014-11-22 NOTE — Progress Notes (Signed)
[place] [facility] [place] NAME: [name]    MR#:  [id number]  DATE OF BIRTH:  Dec 15, 1940   feeling better. Less wheezing. Eager  to work with physical therapy.   CHIEF COMPLAINT:   Chief Complaint  Patient presents with  . Cough    REVIEW OF SYSTEMS:    Review of Systems  Constitutional: Negative for fever and chills.  HENT: Negative for congestion, hearing loss and tinnitus.   Eyes: Negative for blurred vision, double vision and photophobia.  Respiratory: Negative for cough, hemoptysis, shortness of breath and wheezing.   Cardiovascular: Negative for palpitations, orthopnea and leg swelling.  Gastrointestinal: Negative for vomiting, abdominal pain and diarrhea.  Genitourinary: Negative for dysuria and urgency.  Musculoskeletal: Negative for myalgias and neck pain.  Skin: Negative for rash.  Neurological: Negative for dizziness, focal weakness, seizures, weakness and headaches.  Psychiatric/Behavioral: Negative for memory loss. The patient does not have insomnia.     Nutrition:  Tolerating Diet: Tolerating PT:      DRUG ALLERGIES:   Allergies  Allergen Reactions  . Ace Inhibitors Cough  . Latex Hives  . Tetracycline Other (See Comments)    Reaction:  Unknown     VITALS:  Blood pressure 165/68, pulse 85, temperature 98.5 F (36.9 C), temperature source Oral, resp. rate 18, height 5\' 1"  (1.549 m), weight 72.576 kg (160 lb), SpO2 91 %.  PHYSICAL EXAMINATION:   Physical Exam  GENERAL:  74 y.o.-year-old patient lying in the bed with no acute distress.  EYES: Pupils equal, round, reactive to light and accommodation. No scleral icterus. Extraocular muscles intact.  HEENT: Head atraumatic, normocephalic. Oropharynx and nasopharynx clear.  NECK:  Supple, no jugular venous distention. No thyroid enlargement, no tenderness.  LUNGS: faint expiratory wheeze. Not using accessory muscles of  respiration. BCARDIOVASCULAR: S1, S2 normal. No murmurs, rubs, or gallops.  ABDOMEN: Soft, nontender, nondistended. Bowel sounds present. No organomegaly or mass.  EXTREMITIES: No pedal edema, cyanosis, or clubbing.  NEUROLOGIc; left arm weakness and left leg weakness secondary to old stroke. PSYCHIATRIC: The patient is alert and oriented x 3.  SKIN: No obvious rash, lesion, or ulcer.    LABORATORY PANEL:   CBC  Recent Labs Lab 11/19/14 0417  WBC 5.9  HGB 13.5  HCT 38.6  PLT 223   ------------------------------------------------------------------------------------------------------------------  Chemistries   Recent Labs Lab 11/18/14 1821 11/19/14 0417 11/21/14 1310 11/22/14 0354  NA 131* 133*  --   --   K 3.3* 3.2* 3.3* 3.5  CL 92* 95*  --   --   CO2 31 26  --   --   GLUCOSE 107* 164*  --   --   BUN 9 7  --   --   CREATININE 0.73 0.64  --   --   CALCIUM 10.2 9.9  --   --   MG  --   --  1.9  --   AST 28  --   --   --   ALT 19  --   --   --   ALKPHOS 50  --   --   --   BILITOT 0.3  --   --   --    ------------------------------------------------------------------------------------------------------------------  Cardiac Enzymes  Recent Labs Lab 11/18/14 1821  TROPONINI <0.03   ------------------------------------------------------------------------------------------------------------------  RADIOLOGY:  Dg Chest 1 View  11/20/2014  CLINICAL DATA:  74 year old female with cough, shortness of breath and wheezing. EXAM: CHEST 1  VIEW COMPARISON:  11/18/2014 and prior radiographs FINDINGS: Cardiomegaly identified. There is no evidence of focal airspace disease, pulmonary edema, suspicious pulmonary nodule/mass, pleural effusion, or pneumothorax. No acute bony abnormalities are identified. IMPRESSION: Cardiomegaly without evidence of acute cardiopulmonary disease. Electronically Signed   By: Margarette Canada M.D.   On: 11/20/2014 15:32     ASSESSMENT AND PLAN:    Principal Problem:   Community acquired pneumonia Active Problems:   Uncontrolled hypertension   Pneumonia  #1 community-acquired pneumonia: Patient is on IV Rocephin, Zithromax. Clinically improving slowly. Blood cultures are negative.  wheezing likely secondary to reactive airway disease: Continue albuterol, Solu-Medrol,   CHF on chest x-ray but the patient clinically looks like she has pneumonia. Echo EF more than 65% #2 uncontrolled hypertension: Continue amlodipine, Coreg, clonidine. She has anxiety so gave  Xanax.and use the when necessary labetalol. D/c hydrallazine as  bp is  Controlled, History of CVA with left-sided hemiplegia: Continue Aggrenox, statins PT consult today,  #4. Left arm pain secondary to history of stroke before continue Percocet as needed. Physical therapy evaluation.    All the records are reviewed and case discussed with Care Management/Social Workerr. Management plans discussed with the patient, family and they are in agreement.  CODE STATUS:full  TOTAL TIME TAKING CARE OF THIS PATIENT: 35 minutes.   POSSIBLE D/C IN 1-2  DAYS, DEPENDING ON CLINICAL CONDITION.   Epifanio Lesches M.D on 11/22/2014 at 1:06 PM  Between 7am to 6pm - Pager - [phone]  After 6pm go to [email or web address] - password [email or web address] [facility]  Office  [phone]  CC: Primary care physician; [name], MD

## 2014-11-22 NOTE — Progress Notes (Signed)
PT Cancellation Note  Patient Details Name: [name] MRN: [id number] DOB: [birth year]   Cancelled Treatment:    Reason Eval/Treat Not Completed: Patient declined, no reason specified (Not feeling well and not eating dinner).  Try tomorrow.   Ramond Dial 11/22/2014, 5:40 PM   Mee Hives, PT MS Acute Rehab Dept. Number: [phone] [id number] and [place] [phone]

## 2014-11-23 ENCOUNTER — Inpatient Hospital Stay: Payer: PPO

## 2014-11-23 ENCOUNTER — Encounter: Payer: Self-pay | Admitting: Radiology

## 2014-11-23 LAB — POTASSIUM: POTASSIUM: 3.7 mmol/L (ref 3.5–5.1)

## 2014-11-23 LAB — CULTURE, BLOOD (ROUTINE X 2)
Culture: NO GROWTH
Culture: NO GROWTH

## 2014-11-23 MED ORDER — IOHEXOL 350 MG/ML SOLN
75.0000 mL | Freq: Once | INTRAVENOUS | Status: AC | PRN
  Administered 2014-11-23: 75 mL via INTRAVENOUS

## 2014-11-23 MED ORDER — BOOST / RESOURCE BREEZE PO LIQD
1.0000 | Freq: Three times a day (TID) | ORAL | Status: DC
  Administered 2014-11-23 – 2014-11-28 (×11): 1 via ORAL

## 2014-11-23 NOTE — Consult Note (Signed)
MEDICATION RELATED CONSULT NOTE - INITIAL   Pharmacy Consult for electrolytes Indication: hypokalemia  Allergies  Allergen Reactions  . Ace Inhibitors Cough  . Latex Hives  . Tetracycline Other (See Comments)    Reaction:  Unknown     Patient Measurements: Height: 5\' 1"  (154.9 cm) Weight: 160 lb (72.576 kg) IBW/kg (Calculated) : 47.8 Adjusted Body Weight:   Vital Signs: Temp: 98 F (36.7 C) (11/15 2020) Temp Source: Oral (11/15 2020) BP: 154/75 mmHg (11/15 2020) Pulse Rate: 74 (11/15 2020) Intake/Output from previous day:   Intake/Output from this shift:    Labs:  Recent Labs  11/21/14 1310  MG 1.9   Estimated Creatinine Clearance: 55.3 mL/min (by C-G formula based on Cr of 0.64).   Microbiology: Recent Results (from the past 720 hour(Brock))  Blood culture (routine x 2)     Status: None (Preliminary result)   Collection Time: 11/18/14  8:09 PM  Result Value Ref Range Status   Specimen Description BLOOD RIGHT WRIST  Final   Special Requests BOTTLES DRAWN AEROBIC AND ANAEROBIC 4CC  Final   Culture NO GROWTH 4 DAYS  Final   Report Status PENDING  Incomplete  Blood culture (routine x 2)     Status: None (Preliminary result)   Collection Time: 11/18/14  8:56 PM  Result Value Ref Range Status   Specimen Description BLOOD RIGHT ASSIST CONTROL  Final   Special Requests BOTTLES DRAWN AEROBIC AND ANAEROBIC 5CC  Final   Culture NO GROWTH 4 DAYS  Final   Report Status PENDING  Incomplete    Medical History: Past Medical History  Diagnosis Date  . Stroke ([place])   . Hypertension   . TIA (transient ischemic attack)     Medications:  Scheduled:  . amLODipine  10 mg Oral Daily  . antiseptic oral rinse  7 mL Mouth Rinse BID  . azithromycin  500 mg Oral QHS  . calcium-vitamin D  2 tablet Oral BID  . carvedilol  3.125 mg Oral BID WC  . cefTRIAXone (ROCEPHIN)  IV  1 g Intravenous QHS  . citalopram  20 mg Oral Daily  . cloNIDine  0.1 mg Oral BID  .  dipyridamole-aspirin  1 capsule Oral BID  . enoxaparin (LOVENOX) injection  40 mg Subcutaneous QHS  . gabapentin  300 mg Oral TID  . guaiFENesin  600 mg Oral BID  . hydrochlorothiazide  25 mg Oral Daily  . ipratropium-albuterol  3 mL Nebulization Q4H  . losartan  50 mg Oral BID  . methylPREDNISolone (SOLU-MEDROL) injection  60 mg Intravenous Q12H  . oxybutynin  5 mg Oral BID  . pantoprazole  40 mg Oral QAC breakfast  . pneumococcal 23 valent vaccine  0.5 mL Intramuscular Tomorrow-1000  . pravastatin  20 mg Oral q1800  . sodium chloride  3 mL Intravenous Q12H  . tiZANidine  4 mg Oral QID    Assessment: Pt is a 74 year old female with hypokalemia. Pt was given 40MEQ of K yesterday. K only increased from 3.2 to 3.3. Pt is not on any diuretics. Pt is on losartan which is K sparing.   Goal of Therapy:  K= 3.5-5  Plan:  Will give 57 MEQ of K now and 20 Meq at bedtime. Will also check mag level. Recheck K level in the AM.  11/14 pm:  Mag level 1.9 - no supplementation needed at this time.   11/15 K+ 3.5. WNL. No supplementation ordered. Recheck in AM.  11/16 K+ 3.7.  WNL. No supplementation ordered. Recheck in AM.   [name] 11/23/2014,5:39 AM

## 2014-11-23 NOTE — Care Management (Signed)
Patient is for chest CT today to rule out pulmonary embolus.  Physical therapy has not been able to evaluate.  11/15 patient declined and today it is contraindicated until rule out for pulmonary embolus

## 2014-11-23 NOTE — Progress Notes (Signed)
PT Cancellation Note  Patient Details Name: [name] MRN: [id number] DOB: 07-May-1940   Cancelled Treatment:    Reason Eval/Treat Not Completed: Medical issues which prohibited therapy (Patient noted with orders for chest CT to rule out PE.  Will hold evaluation until results received and patient cleared for activity.)   Malin Sambrano H. Owens Shark, PT, DPT, NCS 11/23/2014, 10:42 AM [phone]

## 2014-11-23 NOTE — Progress Notes (Signed)
Patient has rested quietly tonight. Minimal complaints of pain acknowledged and treated with PRN meds; no signs of discomfort or distress noted. Nursing staff will continue to monitor. Earleen Reaper, RN

## 2014-11-23 NOTE — Progress Notes (Signed)
Nutrition Follow-up     INTERVENTION:   Meals and Snacks: Cater to patient preferences, pt reports she needs her meats chopped, needs chopped and soft fruits and vegetables. Recommend downgrading diet to Dysphagia III Medical Food Supplement Therapy: pt does not like Mighty Shakes, does not like the milky supplements. Agreeable to trying [employer], would like to [employer]: pt can feed herself but needs assistance with tray set-up including opening all containers and condiments as pt with left sided hemiparesis   NUTRITION DIAGNOSIS:   Inadequate oral intake related to acute illness as evidenced by per patient/family report, meal completion < 25%.   GOAL:   Patient will meet greater than or equal to 90% of their needs  MONITOR:    (Energy Intake, Anthropometrics, Digestive system, Electrolyte and renal Profile)  REASON FOR ASSESSMENT:   Malnutrition Screening Tool    ASSESSMENT:   Pt admitted with b/l pna. Pt with h/o CVA with left sided hemiparesis.   CT chest today to r/o PE.   Diet Order:  2g sodium  Energy Intake: recorded po intake 71% on average, however pt reports appetite not this good. Pt unable to eat lunch today because chicken was tough, carrots were too hard, did eat some jello. Reports she also ate jello at breakfast. Pt does not like Mighty Shakes, not drinking   Electrolyte and Renal Profile:  Recent Labs Lab 11/18/14 1821 11/19/14 0417 11/21/14 1310 11/22/14 0354 11/23/14 0354  BUN 9 7  --   --   --   CREATININE 0.73 0.64  --   --   --   NA 131* 133*  --   --   --   K 3.3* 3.2* 3.3* 3.5 3.7  MG  --   --  1.9  --   --    Glucose Profile:  Recent Labs  11/22/14 0720  GLUCAP 142*   Meds: solumedrol  Height:   Ht Readings from Last 1 Encounters:  11/18/14 5\' 1"  (1.549 m)    Weight:   Wt Readings from Last 1 Encounters:  11/18/14 160 lb (72.576 kg)    BMI:  Body mass index is 30.25 kg/(m^2).  Estimated  Nutritional Needs:   Kcal:  using IBW of 47.7kg, BEE: 914kcals, TEE: (IF 1.1-1.3)(AF 1.2) 1207-1426kcals  Protein:  38-48g protein (0.8-1.0g/kg)  Fluid:  1193-1429mL of fluid (25-2mL/kg)  EDUCATION NEEDS:   No education needs identified at this time  [place], RD, LDN [phone] Pager

## 2014-11-23 NOTE — Progress Notes (Signed)
Patient in bed resting. No complaints of pain. Complained of SOB. Raised HOB and applied oxygen via nasal cannula at 2L. Patient O2 sats are 96%. Will continue to monitor patient. Horton Finer

## 2014-11-23 NOTE — Progress Notes (Signed)
[place] [facility] [place] NAME: [name]    MR#:  [id number]  DATE OF BIRTH:  [birth year]  seen today. Very anxious. Has some wheezing. Did not work with physical therapy yesterday. Encouraged to work with physical therapy today. I spoke with husband yesterday he wanted physical therapy to make sure she can go home. If she needs rehabilitation he prefers [facility]. Has some dry cough, mild left upper quadrant abdominal pain. Has some abdominal echymosis  due to Lovenox injections.   CHIEF COMPLAINT:   Chief Complaint  Patient presents with  . Cough    REVIEW OF SYSTEMS:    Review of Systems  Constitutional: Negative for fever and chills.  HENT: Negative for congestion, hearing loss and tinnitus.   Eyes: Negative for blurred vision, double vision and photophobia.  Respiratory: Negative for cough, hemoptysis, shortness of breath and wheezing.   Cardiovascular: Negative for palpitations, orthopnea and leg swelling.  Gastrointestinal: Negative for vomiting, abdominal pain and diarrhea.  Genitourinary: Negative for dysuria and urgency.  Musculoskeletal: Negative for myalgias and neck pain.  Skin: Negative for rash.  Neurological: Negative for dizziness, focal weakness, seizures, weakness and headaches.  Psychiatric/Behavioral: Negative for memory loss. The patient does not have insomnia.     Nutrition:  Tolerating Diet: Tolerating PT:      DRUG ALLERGIES:   Allergies  Allergen Reactions  . Ace Inhibitors Cough  . Latex Hives  . Tetracycline Other (See Comments)    Reaction:  Unknown     VITALS:  Blood pressure 163/85, pulse 84, temperature 98 F (36.7 C), temperature source Oral, resp. rate 17, height 5\' 1"  (1.549 m), weight 72.576 kg (160 lb), SpO2 89 %.  PHYSICAL EXAMINATION:   Physical Exam  GENERAL:  74 y.o.-year-old patient lying in the bed with no acute distress.  EYES: Pupils equal, round, reactive to light  and accommodation. No scleral icterus. Extraocular muscles intact.  HEENT: Head atraumatic, normocephalic. Oropharynx and nasopharynx clear.  NECK:  Supple, no jugular venous distention. No thyroid enlargement, no tenderness.  LUNGS: faint expiratory wheeze. Not using accessory muscles of respiration. BCARDIOVASCULAR: S1, S2 normal. No murmurs, rubs, or gallops.  ABDOMEN: Soft, nontender, nondistended. Bowel sounds present. No organomegaly or mass.  EXTREMITIES: No pedal edema, cyanosis, or clubbing.  NEUROLOGIc; left arm weakness and left leg weakness secondary to old stroke. Has chronic pain in the left arm and leg due to stroke. PSYCHIATRIC: The patient is alert and oriented x 3. The anxious. SKIN: No obvious rash, lesion, or ulcer.    LABORATORY PANEL:   CBC  Recent Labs Lab 11/19/14 0417  WBC 5.9  HGB 13.5  HCT 38.6  PLT 223   ------------------------------------------------------------------------------------------------------------------  Chemistries   Recent Labs Lab 11/18/14 1821 11/19/14 0417 11/21/14 1310  11/23/14 0354  NA 131* 133*  --   --   --   K 3.3* 3.2* 3.3*  < > 3.7  CL 92* 95*  --   --   --   CO2 31 26  --   --   --   GLUCOSE 107* 164*  --   --   --   BUN 9 7  --   --   --   CREATININE 0.73 0.64  --   --   --   CALCIUM 10.2 9.9  --   --   --   MG  --   --  1.9  --   --  AST 28  --   --   --   --   ALT 19  --   --   --   --   ALKPHOS 50  --   --   --   --   BILITOT 0.3  --   --   --   --   < > = values in this interval not displayed. ------------------------------------------------------------------------------------------------------------------  Cardiac Enzymes  Recent Labs Lab 11/18/14 1821  TROPONINI <0.03   ------------------------------------------------------------------------------------------------------------------  RADIOLOGY:  No results found.   ASSESSMENT AND PLAN:   Principal Problem:   Community acquired  pneumonia Active Problems:   Uncontrolled hypertension   Pneumonia  #1 community-acquired pneumonia: Patient is on IV Rocephin, Zithromax. Clinically improving slowly. Blood cultures are negative. Patient had hypoxia with O2 sats 89% on room air today. Get a CT of the chest.  wheezing likely secondary to reactive airway disease: Continue  nebulizers every 4hrs,start o2, Solu-Medrol,   CHF on chest x-ray but the patient clinically looks like she has pneumonia. Echo EF more than 65%  #2 uncontrolled hypertension: Controlled better today.   History of CVA with left-sided hemiplegia: Continue Aggrenox, statins  PT consult today,, encouraged to work with physical therapy.  #4. Left arm pain secondary to history of stroke before continue Percocet as needed.     All the records are reviewed and case discussed with Care Management/Social Workerr. Management plans discussed with the patient, family and they are in agreement.  CODE STATUS:full  TOTAL TIME TAKING CARE OF THIS PATIENT: 35 minutes.   POSSIBLE D/C IN 1-2  DAYS, DEPENDING ON CLINICAL CONDITION.   Epifanio Lesches M.D on 11/23/2014 at 10:12 AM  Between 7am to 6pm - Pager - [phone]  After 6pm go to [email or web address] - password [email or web address] [facility]  Office  [phone]  CC: Primary care physician; [name], MD

## 2014-11-24 ENCOUNTER — Inpatient Hospital Stay: Payer: PPO

## 2014-11-24 MED ORDER — AMOXICILLIN-POT CLAVULANATE 875-125 MG PO TABS
1.0000 | ORAL_TABLET | Freq: Two times a day (BID) | ORAL | Status: DC
  Administered 2014-11-24 – 2014-11-29 (×9): 1 via ORAL
  Filled 2014-11-24 (×10): qty 1

## 2014-11-24 MED ORDER — LOSARTAN POTASSIUM 25 MG PO TABS
25.0000 mg | ORAL_TABLET | Freq: Two times a day (BID) | ORAL | Status: DC
  Administered 2014-11-24 – 2014-11-30 (×11): 25 mg via ORAL
  Filled 2014-11-24 (×2): qty 1
  Filled 2014-11-24: qty 2
  Filled 2014-11-24 (×3): qty 1
  Filled 2014-11-24: qty 2
  Filled 2014-11-24 (×4): qty 1

## 2014-11-24 MED ORDER — DILTIAZEM HCL 25 MG/5ML IV SOLN
10.0000 mg | Freq: Once | INTRAVENOUS | Status: AC
  Administered 2014-11-24: 10 mg via INTRAVENOUS
  Filled 2014-11-24: qty 5

## 2014-11-24 MED ORDER — CLONIDINE HCL 0.1 MG PO TABS: 0.1000 mg | ORAL_TABLET | Freq: Every day | ORAL | Status: DC

## 2014-11-24 MED ORDER — AMLODIPINE BESYLATE 5 MG PO TABS
5.0000 mg | ORAL_TABLET | Freq: Every day | ORAL | Status: DC
  Administered 2014-11-25 – 2014-11-30 (×6): 5 mg via ORAL
  Filled 2014-11-24 (×6): qty 1

## 2014-11-24 NOTE — Clinical Social Work Note (Signed)
Clinical Social Work Assessment  Patient Details  Name: [name] MRN: [id number] Date of Birth: 10-Dec-1940  Date of referral:  11/24/14               Reason for consult:  Facility Placement                Permission sought to share information with:  Facility Sport and exercise psychologist, Family Supports Permission granted to share information::  Yes, Verbal Permission Granted  Name::      (husband)  Agency::     Relationship::     Contact Information:     Housing/Transportation Living arrangements for the past 2 months:  Single Family Home Source of Information:  Patient, Spouse Patient Interpreter Needed:  None Criminal Activity/Legal Involvement Pertinent to Current Situation/Hospitalization:  No - Comment as needed Significant Relationships:  Adult Children, Spouse Lives with:  Spouse Do you feel safe going back to the place where you live?  Yes Need for family participation in patient care:  Yes (Comment)  Care giving concerns:  Husband concerned that he is unable to care for patient at home.   Social Worker assessment / plan:  Holiday representative (CSW) consult for SNF placement, PT is recommending STR to assist with getting patient back to previous level of functioning.   Patient was alert and oriented and engaged in conversation with CSW.  (additional Information provided by patient's husband at bedside).  CSW introduced self and explained role of CSW department.  Patient currently lives her husband, husband states he is unable to care for patient at home.  Patient has two adult daughters they work out of town . CSW explained that PT is recommending rehab.  Patient has been to SNF in the past and states she does not want [facility]. CSW reviewed SNF process with patient, Patient is agreeable to SNF search and prefers.     CSW will complete FL2, and fax out for available bed in anticipation of discharging to SNF for rehab.   Employment status:  Disabled (Comment on whether or  not currently receiving Disability) Insurance information:  Managed Medicare [facility]) PT Recommendations:  [place] / Referral to community resources:  [place]  Patient/Family's Response to care:  Patient and husband were both appreciative of information provided by CSW   Patient/Family's Understanding of and Emotional Response to Diagnosis, Current Treatment, and Prognosis: Patient and husband understands that patient is under continued medical work up at this time.  Once medically stable she will discharge to SNF.  Emotional Assessment Appearance:  Appears stated age Attitude/Demeanor/Rapport:    Affect (typically observed):  Accepting, Adaptable, Hopeful, Pleasant, Calm Orientation:  Oriented to Self, Oriented to Place, Oriented to  Time, Oriented to Situation Alcohol / Substance use:  Tobacco Use Psych involvement (Current and /or in the community):  No (Comment)  Discharge Needs  Concerns to be addressed:  Discharge Planning Concerns, Care Coordination Readmission within the last 30 days:  No Current discharge risk:  Chronically ill, Dependent with Mobility, Other (husband unable to meet needs) Barriers to Discharge:  Continued Medical Work up, No Barriers Identified   Maurine Cane, LCSW 11/24/2014, 3:01 PM

## 2014-11-24 NOTE — NC FL2 (Signed)
[place] LEVEL OF CARE SCREENING TOOL     IDENTIFICATION  Patient Name: [name] Birthdate: [birth year] Sex: female Admission Date (Current Location): 11/18/2014  [place] and [place] Number:  Set designer )   Facility and Address:  [facility], [address] [place], [place] [address]      Provider Number: [id number]  Attending Physician Name and Address:  Epifanio Lesches, MD  Relative Name and Phone Number:       Current Level of Care: Hospital Recommended Level of Care: [place] Prior Approval Number:    Date Approved/Denied:   PASRR Number:  ([id number])  Discharge Plan: SNF    Current Diagnoses: Patient Active Problem List   Diagnosis Date Noted  . Pneumonia 11/19/2014  . Community acquired pneumonia 11/18/2014  . Uncontrolled hypertension 11/18/2014  . LARYNGITIS, CHRONIC 08/01/2009  . HYPOKALEMIA 06/23/2009  . SHOULDER IMPINGEMENT SYNDROME, LEFT 04/20/2009  . DEGENERATIVE JOINT DISEASE 04/13/2009  . SHOULDER PAIN 04/10/2009  . HEMIPARESIS, LEFT 04/04/2009  . LACUNAR INFARCTION 04/04/2009  . HYPERGLYCEMIA 04/04/2009  . Nonspecific (abnormal) findings on radiological and other examination of body structure 10/22/2007  . ABNORMAL CHEST XRAY 10/22/2007  . GERD 10/13/2007  . HYPOTHYROIDISM 07/23/2006  . HYPERLIPIDEMIA 07/23/2006  . HYPERTENSION 07/23/2006  . ROSACEA 07/23/2006  . OSTEOPENIA 07/23/2006  . ALLERGY 07/23/2006    Orientation ACTIVITIES/SOCIAL BLADDER RESPIRATION    Self, Time, Situation, Place    Incontinent (at times) O2 (As needed)  BEHAVIORAL SYMPTOMS/MOOD NEUROLOGICAL BOWEL NUTRITION STATUS      Continent Diet (Dsyphagia III  2 gram sodium)  PHYSICIAN VISITS COMMUNICATION OF NEEDS Height & Weight Skin    Verbally 5\' 1"  (154.9 cm) 160 lbs. Normal          AMBULATORY STATUS RESPIRATION    Assist extensive O2 (As needed)      Personal Care Assistance Level of  Assistance  Bathing, Feeding, Dressing Bathing Assistance: Maximum assistance Feeding assistance: Independent Dressing Assistance: Maximum assistance      Functional Limitations Info  Sight, Hearing, Speech, Contractures Sight Info: Adequate Hearing Info: Adequate Speech Info: Impaired Contractures Info: Impaired     SPECIAL CARE FACTORS FREQUENCY  PT (By licensed PT)                   Additional Factors Info  Allergies   Allergies Info:  (Ace Inhibitors, Latex, Tetracycline)           Current Medications (11/24/2014): Current Facility-Administered Medications  Medication Dose Route Frequency Provider Last Rate Last Dose  . 0.9 %  sodium chloride infusion  250 mL Intravenous PRN Saundra Shelling, MD      . acetaminophen (TYLENOL) tablet 500 mg  500 mg Oral Q6H PRN Saundra Shelling, MD   500 mg at 11/23/14 0402  . ALPRAZolam Duanne Moron) tablet 0.5 mg  0.5 mg Oral TID PRN Epifanio Lesches, MD   0.5 mg at 11/23/14 0859  . [START ON 11/25/2014] amLODipine (NORVASC) tablet 5 mg  5 mg Oral Daily Epifanio Lesches, MD      . amoxicillin-clavulanate (AUGMENTIN) 875-125 MG per tablet 1 tablet  1 tablet Oral Q12H Epifanio Lesches, MD      . antiseptic oral rinse (CPC / CETYLPYRIDINIUM CHLORIDE 0.05%) solution 7 mL  7 mL Mouth Rinse BID Saundra Shelling, MD   7 mL at 11/24/14 1000  . calcium-vitamin D (OSCAL WITH D) 500-200 MG-UNIT per tablet 2 tablet  2 tablet Oral BID Saundra Shelling, MD  2 tablet at 11/24/14 0902  . carvedilol (COREG) tablet 3.125 mg  3.125 mg Oral BID WC Saundra Shelling, MD   3.125 mg at 11/24/14 0859  . citalopram (CELEXA) tablet 20 mg  20 mg Oral Daily Saundra Shelling, MD   20 mg at 11/24/14 0902  . cloNIDine (CATAPRES) tablet 0.1 mg  0.1 mg Oral BID Saundra Shelling, MD   0.1 mg at 11/24/14 0903  . dipyridamole-aspirin (AGGRENOX) 200-25 MG per 12 hr capsule 1 capsule  1 capsule Oral BID Saundra Shelling, MD   1 capsule at 11/24/14 0902  . enoxaparin (LOVENOX) injection 40  mg  40 mg Subcutaneous QHS Saundra Shelling, MD   40 mg at 11/23/14 2115  . feeding supplement (BOOST / RESOURCE BREEZE) liquid 1 Container  1 Container Oral TID WC Epifanio Lesches, MD   1 Container at 11/24/14 1237  . gabapentin (NEURONTIN) capsule 300 mg  300 mg Oral TID Saundra Shelling, MD   300 mg at 11/24/14 1513  . guaiFENesin (MUCINEX) 12 hr tablet 600 mg  600 mg Oral BID Saundra Shelling, MD   600 mg at 11/24/14 0902  . hydrochlorothiazide (HYDRODIURIL) tablet 25 mg  25 mg Oral Daily Saundra Shelling, MD   25 mg at 11/24/14 0902  . ipratropium-albuterol (DUONEB) 0.5-2.5 (3) MG/3ML nebulizer solution 3 mL  3 mL Nebulization Q4H Epifanio Lesches, MD   3 mL at 11/24/14 1505  . labetalol (NORMODYNE,TRANDATE) injection 10 mg  10 mg Intravenous Q2H PRN Saundra Shelling, MD   10 mg at 11/19/14 0459  . losartan (COZAAR) tablet 25 mg  25 mg Oral BID Epifanio Lesches, MD      . methylPREDNISolone sodium succinate (SOLU-MEDROL) 125 mg/2 mL injection 60 mg  60 mg Intravenous Q12H Epifanio Lesches, MD   60 mg at 11/24/14 0859  . oxybutynin (DITROPAN) tablet 5 mg  5 mg Oral BID Saundra Shelling, MD   5 mg at 11/24/14 0902  . oxyCODONE-acetaminophen (PERCOCET/ROXICET) 5-325 MG per tablet 2 tablet  2 tablet Oral Q6H PRN Saundra Shelling, MD   2 tablet at 11/24/14 0246  . pantoprazole (PROTONIX) EC tablet 40 mg  40 mg Oral QAC breakfast Saundra Shelling, MD   40 mg at 11/24/14 0859  . pneumococcal 23 valent vaccine (PNU-IMMUNE) injection 0.5 mL  0.5 mL Intramuscular Tomorrow-1000 Pavan Pyreddy, MD   0.5 mL at 11/20/14 1000  . pravastatin (PRAVACHOL) tablet 20 mg  20 mg Oral q1800 Saundra Shelling, MD   20 mg at 11/23/14 1722  . sodium chloride 0.9 % injection 3 mL  3 mL Intravenous Q12H Saundra Shelling, MD   3 mL at 11/24/14 0903  . sodium chloride 0.9 % injection 3 mL  3 mL Intravenous PRN Saundra Shelling, MD   3 mL at 11/19/14 1920  . tiZANidine (ZANAFLEX) tablet 4 mg  4 mg Oral QID Saundra Shelling, MD   4 mg at 11/24/14 1513    Do not use this list as official medication orders. Please verify with discharge summary.  Discharge Medications:   Medication List    ASK your doctor about these medications        acetaminophen 500 MG tablet  Commonly known as:  TYLENOL  Take 500 mg by mouth every 6 (six) hours as needed for mild pain.     amLODipine 5 MG tablet  Commonly known as:  NORVASC  Take 5 mg by mouth daily.     CALCIUM 600+D 600-400 MG-UNIT tablet  Generic drug:  Calcium Carbonate-Vitamin D  Take 2 tablets by mouth 2 (two) times daily.     carvedilol 3.125 MG tablet  Commonly known as:  COREG  Take 3.125 mg by mouth 2 (two) times daily.     citalopram 20 MG tablet  Commonly known as:  CELEXA  Take 20 mg by mouth daily.     dipyridamole-aspirin 200-25 MG 12hr capsule  Commonly known as:  AGGRENOX  Take 1 capsule by mouth 2 (two) times daily.     gabapentin 300 MG capsule  Commonly known as:  NEURONTIN  Take 300 mg by mouth 3 (three) times daily.     hydrochlorothiazide 25 MG tablet  Commonly known as:  HYDRODIURIL  Take 25 mg by mouth daily.     losartan 50 MG tablet  Commonly known as:  COZAAR  Take 50 mg by mouth 2 (two) times daily.     lovastatin 20 MG tablet  Commonly known as:  MEVACOR  Take 20 mg by mouth at bedtime.     MUCINEX MAXIMUM STRENGTH 1200 MG Tb12  Generic drug:  Guaifenesin  Take 1,200 mg by mouth 2 (two) times daily.     omeprazole 20 MG capsule  Commonly known as:  PRILOSEC  Take 20 mg by mouth daily.     oxybutynin 5 MG tablet  Commonly known as:  DITROPAN  Take 5 mg by mouth 2 (two) times daily.     tiZANidine 4 MG tablet  Commonly known as:  ZANAFLEX  Take 4 mg by mouth 4 (four) times daily.     traMADol 50 MG tablet  Commonly known as:  ULTRAM  Take 50 mg by mouth every 8 (eight) hours as needed for moderate pain.        Relevant Imaging Results:  Relevant Lab Results:  Recent Labs    Additional Information  (SS:  [id number])  Maurine Cane, LCSW

## 2014-11-24 NOTE — Progress Notes (Signed)
Notified Dr. Vianne Bulls that patient's blood pressure has dropped to 82/52. After moving around pressure only came up to 92/55. No new orders at this time. Physical therapy is going to attempt to stand patient to the chair at this time. Will continue to monitor.

## 2014-11-24 NOTE — Progress Notes (Signed)
Patient's HR dropped back into the 80s-90s one hour after giving Cardizem as well as Percocet. Patient has rested quietly tonight other than the previous note. Nursing staff will continue to monitor. Earleen Reaper, RN

## 2014-11-24 NOTE — Progress Notes (Signed)
Per physical therapy blood pressure came up to 111/69. Oxygen was kept on and was at 93% while at rest on the 2 liters, so PT did not attempt to take it off. Informed patient that when she gets back to bed we will check her oxygen sat on room air.

## 2014-11-24 NOTE — Progress Notes (Signed)
[facility] NAME: [name]    MR#:  [id number]  DATE OF BIRTH:  Aug 13, 1940  Sitting in chair. patient has no shortness of breath. Running low blood pressure today.   CHIEF COMPLAINT:   Chief Complaint  Patient presents with  . Cough    REVIEW OF SYSTEMS:    Review of Systems  Constitutional: Negative for fever and chills.  HENT: Negative for congestion, hearing loss and tinnitus.   Eyes: Negative for blurred vision, double vision and photophobia.  Respiratory: Negative for cough, hemoptysis, shortness of breath and wheezing.   Cardiovascular: Negative for palpitations, orthopnea and leg swelling.  Gastrointestinal: Negative for vomiting, abdominal pain and diarrhea.  Genitourinary: Negative for dysuria and urgency.  Musculoskeletal: Negative for myalgias and neck pain.  Skin: Negative for rash.  Neurological: Negative for dizziness, focal weakness, seizures, weakness and headaches.  Psychiatric/Behavioral: Negative for memory loss. The patient does not have insomnia.     Nutrition:  Tolerating Diet: Tolerating PT:      DRUG ALLERGIES:   Allergies  Allergen Reactions  . Ace Inhibitors Cough  . Latex Hives  . Tetracycline Other (See Comments)    Reaction:  Unknown     VITALS:  Blood pressure 111/69, pulse 83, temperature 97.4 F (36.3 C), temperature source Oral, resp. rate 18, height 5\' 1"  (1.549 m), weight 72.576 kg (160 lb), SpO2 94 %.  PHYSICAL EXAMINATION:   Physical Exam  GENERAL:  74 y.o.-year-old patient lying in the bed with no acute distress.  EYES: Pupils equal, round, reactive to light and accommodation. No scleral icterus. Extraocular muscles intact.  HEENT: Head atraumatic, normocephalic. Oropharynx and nasopharynx clear.  NECK:  Supple, no jugular venous distention. No thyroid enlargement, no tenderness.  LUNGS: faint expiratory wheeze. Not using accessory muscles of  respiration. BCARDIOVASCULAR: S1, S2 normal. No murmurs, rubs, or gallops.  ABDOMEN: Soft, nontender, nondistended. Bowel sounds present. No organomegaly or mass.  EXTREMITIES: No pedal edema, cyanosis, or clubbing.  NEUROLOGIc; left arm weakness and left leg weakness secondary to old stroke. Has chronic pain in the left arm and leg due to stroke. PSYCHIATRIC: The patient is alert and oriented x 3. The anxious. SKIN: No obvious rash, lesion, or ulcer.    LABORATORY PANEL:   CBC  Recent Labs Lab 11/19/14 0417  WBC 5.9  HGB 13.5  HCT 38.6  PLT 223   ------------------------------------------------------------------------------------------------------------------  Chemistries   Recent Labs Lab 11/18/14 1821 11/19/14 0417 11/21/14 1310  11/23/14 0354  NA 131* 133*  --   --   --   K 3.3* 3.2* 3.3*  < > 3.7  CL 92* 95*  --   --   --   CO2 31 26  --   --   --   GLUCOSE 107* 164*  --   --   --   BUN 9 7  --   --   --   CREATININE 0.73 0.64  --   --   --   CALCIUM 10.2 9.9  --   --   --   MG  --   --  1.9  --   --   AST 28  --   --   --   --   ALT 19  --   --   --   --   ALKPHOS 50  --   --   --   --   BILITOT 0.3  --   --   --   --   < > =  values in this interval not displayed. ------------------------------------------------------------------------------------------------------------------  Cardiac Enzymes  Recent Labs Lab 11/18/14 1821  TROPONINI <0.03   ------------------------------------------------------------------------------------------------------------------  RADIOLOGY:  Ct Angio Chest Pe W/cm &/or Wo Cm  11/23/2014  CLINICAL DATA:  Cough, shortness of breath. EXAM: CT ANGIOGRAPHY CHEST WITH CONTRAST TECHNIQUE: Multidetector CT imaging of the chest was performed using the standard protocol during bolus administration of intravenous contrast. Multiplanar CT image reconstructions and MIPs were obtained to evaluate the vascular anatomy. CONTRAST:  39mL  OMNIPAQUE IOHEXOL 350 MG/ML SOLN COMPARISON:  Chest radiograph of November 20, 2014. FINDINGS: No pneumothorax or pleural effusion is noted. Minimal subsegmental atelectasis is noted posteriorly in both lung bases. There is no evidence of thoracic aortic dissection or aneurysm. Moderate sliding-type hiatal hernia is noted. There is no evidence of pulmonary embolus. Large left thyroid mass measuring 4.8 x 4.1 cm is noted. Mild compression deformity is noted in upper thoracic vertebral body consistent with old fracture. Within the visualized portion of upper abdomen, 2.5 cm left adrenal nodule is noted. Review of the MIP images confirms the above findings. IMPRESSION: No evidence of pulmonary embolus. Moderate sliding-type hiatal hernia. 4.8 cm left thyroid mass is noted. Thyroid ultrasound is recommended for further evaluation. 2.5 cm left adrenal nodule is noted. Follow-up CT or MRI in 12 months is recommended to ensure stability. Electronically Signed   By: Marijo Conception, M.D.   On: 11/23/2014 15:15     ASSESSMENT AND PLAN:   Principal Problem:   Community acquired pneumonia Active Problems:   Uncontrolled hypertension   Pneumonia  #1 community-acquired pneumonia: Patient is on IV Rocephin, Zithromax. Clinically improving slowly. Blood cultures are negative.    2.acute hypoxia with respiratory failure secondary to pneumonia: Patient CT of the chest is negative for pulmonary emboli. Saturating better today on now 94%. Patient feels better with oxygen. Continue.  3.wheezing likely secondary to reactive airway disease: Continue  nebulizers every 4hrs,start o2, Solu-Medrol, less wheezing today. Continue steroids.  CHF on chest x-ray but the patient clinically looks like she has pneumonia. Echo EF more than 65%  #4. Hypotension today. I adjusted the dose of Norvasc, decreased the clonidine. Losartan,monitor closely. History of CVA with left-sided hemiplegia: Continue Aggrenox, statins  PT consult  today,, encouraged to work with physical therapy.PT recommends SNF placement' likely d/c am if bp is stable.  #4. Left arm pain secondary to history of stroke before continue Percocet as needed. 5. Anxiety and depression: Continuing Xanax, Celexa.    All the records are reviewed and case discussed with Care Management/Social Workerr. Management plans discussed with the patient, family and they are in agreement.  CODE STATUS:full  TOTAL TIME TAKING CARE OF THIS PATIENT: 35 minutes.   POSSIBLE D/C IN 1-2  DAYS, DEPENDING ON CLINICAL CONDITION.   Epifanio Lesches M.D on 11/24/2014 at 4:02 PM  Between 7am to 6pm - Pager - [phone]  After 6pm go to [email or web address] - password [email or web address] [facility]  Office  [phone]  CC: Primary care physician; [name], MD

## 2014-11-24 NOTE — Progress Notes (Signed)
Patient's HR jumped up in the range of 140s -170s and sustained, but patient is asymptomatic. Notified MD of situation and MD to put in orders to administer 10 mg Cardizem IV. Will reassess after med given and update MD if patient status is unchanged. Nursing staff will continue to monitor. Earleen Reaper, RN

## 2014-11-24 NOTE — Progress Notes (Signed)
Patient has been alert and oriented. No complaints of pain other than a headache that resolved on its own. Patient was able to sit in the chair for a while today after PT worked with her. Staff able to transfer back to bed 2 person assist. Blood pressure has been running low since this AM. Held dinner dose of coreg with BP 100/41. IV antibiotics switched to PO today. Will continue to monitor.

## 2014-11-24 NOTE — Care Management (Signed)
Physical therapy has recommended skilled nursing placement.  Bed search has been initiated.  Spoke with attending and anticipate discharge 11/18.

## 2014-11-24 NOTE — Care Management Important Message (Signed)
Important Message  Patient Details  Name: [name] MRN: [id number] Date of Birth: 1940/02/20   Medicare Important Message Given:  Yes    Katrina Stack, RN 11/24/2014, 6:07 PM

## 2014-11-24 NOTE — Evaluation (Signed)
Physical Therapy Evaluation Patient Details Name: [name] MRN: [id number] DOB: January 30, 1940 Today's Date: 11/24/2014   History of Present Illness  presented to ER secondary to cough, SOB; admitted with bilat community-acquired PNA.  Of note, CT completed on previous date negative for PE  Clinical Impression  Upon evaluation, patient alert and oriented to basic information; follows all commands and demonstrates good insight into deficits and care needs.  Somewhat talkative (with slowed, hypophonic speech) and with multiple needs throughout session.  L hemi-body with significant residual hemiplegia and noted extensor tone (with associated contraction); poor integration and ability to utilize with functional tasks.  Currently requiring max assist +2 for bed mobility; max assist +2 for sit/stand and bed/chair transfer via SPT.  Patient very fearful.  Very unsteady and unsafe to attempt without +2 at this time. Would benefit from skilled PT to address above deficits and promote optimal return to PLOF; recommend transition to STR upon discharge from acute hospitalization.     Follow Up Recommendations SNF    Equipment Recommendations       Recommendations for Other Services       Precautions / Restrictions Precautions Precautions: Fall Restrictions Weight Bearing Restrictions: No      Mobility  Bed Mobility Overal bed mobility: Needs Assistance;+2 for physical assistance Bed Mobility: Supine to Sit     Supine to sit: Max assist;+2 for physical assistance        Transfers Overall transfer level: Needs assistance   Transfers: Sit to/from Stand;Stand Pivot Transfers Sit to Stand: +2 physical assistance;Max assist Stand pivot transfers: Max assist;+2 physical assistance       General transfer comment: excessive weight shift to R LE; limited active use of L LE.  Takes small steps with R LE and tends to use weight shifting/momentum to advance L LE (maintained in ankle  PF, genu recurvatum, slightly ahead of immediate BOS in all upright activities).  Very unsteady and unsafe to attempt without +2 at this time.  Ambulation/Gait             General Gait Details: unsafe/unable this date  Stairs            Wheelchair Mobility    Modified Rankin (Stroke Patients Only)       Balance Overall balance assessment: Needs assistance Sitting-balance support: No upper extremity supported;Feet supported Sitting balance-Leahy Scale: Poor Sitting balance - Comments: kyphotic, posterior weight shift/trunk lean   Standing balance support: Single extremity supported Standing balance-Leahy Scale: Poor                               Pertinent Vitals/Pain Pain Assessment: No/denies pain    Home Living Family/patient expects to be discharged to:: Private residence Living Arrangements: Spouse/significant other Available Help at Discharge: Family Type of Home: House Home Access: Stairs to enter Entrance Stairs-Rails: None Technical brewer of Steps: 1+1 Home Layout: One level Home Equipment:  (hemi-walker, lift chair)      Prior Function Level of Independence: Needs assistance         Comments: Assist from husband for ADLs, bed mobility and transfers as needed; very limited household ambulator at baseline     Hand Dominance   Dominant Hand: Right    Extremity/Trunk Assessment   Upper Extremity Assessment:  (R UE grossly WFL; L UE with significant extensor tone, though L hand fisted with marked hyperext of DIPs.  Contractures evident)  Lower Extremity Assessment:  (R LE grossly WFL; L LE with significant extensor tone, lacking 15-20 degrees of ankle PF)         Communication   Communication: No difficulties (speech slightly slower and hypophonic)  Cognition Arousal/Alertness: Awake/alert Behavior During Therapy: WFL for tasks assessed/performed Overall Cognitive Status: Difficult to assess (patient  somewhat hyperverbal with multiple needs; difficulty maintaining attention to task)                      General Comments      Exercises        Assessment/Plan    PT Assessment Patient needs continued PT services  PT Diagnosis Difficulty walking;Generalized weakness   PT Problem List Decreased strength;Decreased range of motion;Decreased activity tolerance;Decreased balance;Decreased mobility;Decreased coordination;Decreased knowledge of use of DME;Decreased safety awareness;Decreased knowledge of precautions;Obesity  PT Treatment Interventions DME instruction;Gait training;Stair training;Functional mobility training;Therapeutic activities;Therapeutic exercise;Balance training;Patient/family education;Neuromuscular re-education   PT Goals (Current goals can be found in the Care Plan section) Acute Rehab PT Goals Patient Stated Goal: "to get up" PT Goal Formulation: With patient Time For Goal Achievement: 12/08/14 Potential to Achieve Goals: Fair    Frequency Min 2X/week   Barriers to discharge Decreased caregiver support      Co-evaluation               End of Session Equipment Utilized During Treatment: Gait belt Activity Tolerance: Patient tolerated treatment well Patient left: in chair;with call bell/phone within reach (pad under patient, box not available.  RN informed/awre of patient position; patient aware of need for assist (and agrees to call) with return to bed) Nurse Communication: Mobility status         Time: [id number] PT Time Calculation (min) (ACUTE ONLY): 41 min   Charges:   PT Evaluation $Initial PT Evaluation Tier I: 1 Procedure PT Treatments $Therapeutic Activity: 8-22 mins   PT G Codes:        Dajae Kizer H. Owens Shark, PT, DPT, NCS 11/24/2014, 1:53 PM [phone]

## 2014-11-25 ENCOUNTER — Inpatient Hospital Stay: Payer: PPO

## 2014-11-25 ENCOUNTER — Encounter
Admission: RE | Admit: 2014-11-25 | Discharge: 2014-11-25 | Disposition: A | Discharge status: Home / Self Care | Payer: PPO | Source: Ambulatory Visit | Attending: Internal Medicine | Admitting: Internal Medicine

## 2014-11-25 DIAGNOSIS — J189 Pneumonia, unspecified organism: Principal | ICD-10-CM

## 2014-11-25 DIAGNOSIS — E119 Type 2 diabetes mellitus without complications: Secondary | ICD-10-CM | POA: Insufficient documentation

## 2014-11-25 DIAGNOSIS — Z794 Long term (current) use of insulin: Secondary | ICD-10-CM | POA: Insufficient documentation

## 2014-11-25 DIAGNOSIS — R404 Transient alteration of awareness: Secondary | ICD-10-CM

## 2014-11-25 LAB — BLOOD GAS, ARTERIAL
Acid-Base Excess: 7.6 mmol/L — ABNORMAL HIGH (ref 0.0–3.0)
Allens test (pass/fail): POSITIVE — AB
Bicarbonate: 31 mEq/L — ABNORMAL HIGH (ref 21.0–28.0)
FIO2: 0.24
O2 Saturation: 95.5 %
PCO2 ART: 38 mmHg (ref 32.0–48.0)
PH ART: 7.52 — AB (ref 7.350–7.450)
Patient temperature: 37
pO2, Arterial: 70 mmHg — ABNORMAL LOW (ref 83.0–108.0)

## 2014-11-25 LAB — GLUCOSE, CAPILLARY
GLUCOSE-CAPILLARY: 204 mg/dL — AB (ref 65–99)
Glucose-Capillary: 135 mg/dL — ABNORMAL HIGH (ref 65–99)
Glucose-Capillary: 115 mg/dL — ABNORMAL HIGH (ref 65–99)

## 2014-11-25 LAB — CREATININE, SERUM
CREATININE: 0.75 mg/dL (ref 0.44–1.00)
GFR calc Af Amer: >60 mL/min (ref >60)
GFR calc non Af Amer: >60 mL/min (ref >60)

## 2014-11-25 LAB — COMPREHENSIVE METABOLIC PANEL
ALK PHOS: 31 U/L — AB (ref 38–126)
ALT: 25 U/L (ref 14–54)
ANION GAP: 7 (ref 5–15)
AST: 24 U/L (ref 15–41)
Albumin: 3.2 g/dL — ABNORMAL LOW (ref 3.5–5.0)
BILIRUBIN TOTAL: 0.6 mg/dL (ref 0.3–1.2)
BUN: 35 mg/dL — ABNORMAL HIGH (ref 6–20)
CALCIUM: 10.3 mg/dL (ref 8.9–10.3)
CO2: 31 mmol/L (ref 22–32)
CREATININE: 1.09 mg/dL — AB (ref 0.44–1.00)
Chloride: 91 mmol/L — ABNORMAL LOW (ref 101–111)
GFR calc non Af Amer: 48 mL/min — ABNORMAL LOW (ref >60)
GFR, EST AFRICAN AMERICAN: 56 mL/min — AB (ref >60)
Glucose, Bld: 162 mg/dL — ABNORMAL HIGH (ref 65–99)
Potassium: 3.6 mmol/L (ref 3.5–5.1)
SODIUM: 129 mmol/L — AB (ref 135–145)
TOTAL PROTEIN: 5.7 g/dL — AB (ref 6.5–8.1)

## 2014-11-25 LAB — CBC WITH DIFFERENTIAL/PLATELET
BAND NEUTROPHILS: 0 %
BASOS PCT: 0 %
BLASTS: 0 %
Basophils Absolute: 0 10*3/uL (ref 0–0.1)
EOS ABS: 0 10*3/uL (ref 0–0.7)
Eosinophils Relative: 0 %
HEMATOCRIT: 26.2 % — AB (ref 35.0–47.0)
HEMOGLOBIN: 8.9 g/dL — AB (ref 12.0–16.0)
Lymphocytes Relative: 18 %
Lymphs Abs: 2.9 10*3/uL (ref 1.0–3.6)
MCH: 30.9 pg (ref 26.0–34.0)
MCHC: 33.9 g/dL (ref 32.0–36.0)
MCV: 91.2 fL (ref 80.0–100.0)
MONO ABS: 1.1 10*3/uL — AB (ref 0.2–0.9)
MYELOCYTES: 1 %
Metamyelocytes Relative: 0 %
Monocytes Relative: 7 %
NEUTROS PCT: 74 %
NRBC: 0 /100{WBCs}
Neutro Abs: 12 10*3/uL — ABNORMAL HIGH (ref 1.4–6.5)
Other: 0 %
PROMYELOCYTES ABS: 0 %
Platelets: 254 10*3/uL (ref 150–440)
RBC: 2.88 MIL/uL — ABNORMAL LOW (ref 3.80–5.20)
RDW: 12.5 % (ref 11.5–14.5)
WBC: 16 10*3/uL — ABNORMAL HIGH (ref 3.6–11.0)

## 2014-11-25 LAB — LACTIC ACID, PLASMA: LACTIC ACID, VENOUS: 2.1 mmol/L — AB (ref 0.5–2.0)

## 2014-11-25 MED ORDER — PREDNISONE 50 MG PO TABS
50.0000 mg | ORAL_TABLET | Freq: Every day | ORAL | Status: DC
  Administered 2014-11-25 – 2014-11-26 (×2): 50 mg via ORAL
  Filled 2014-11-25 (×2): qty 1

## 2014-11-25 MED ORDER — CARVEDILOL 12.5 MG PO TABS
12.5000 mg | ORAL_TABLET | Freq: Two times a day (BID) | ORAL | Status: DC
  Administered 2014-11-25: 12.5 mg via ORAL
  Filled 2014-11-25: qty 1

## 2014-11-25 MED ORDER — CARVEDILOL 3.125 MG PO TABS
6.2500 mg | ORAL_TABLET | Freq: Two times a day (BID) | ORAL | Status: DC
  Administered 2014-11-25 – 2014-11-30 (×9): 6.25 mg via ORAL
  Filled 2014-11-25: qty 2
  Filled 2014-11-25 (×2): qty 1
  Filled 2014-11-25: qty 2
  Filled 2014-11-25 (×3): qty 1
  Filled 2014-11-25 (×2): qty 2

## 2014-11-25 MED ORDER — INSULIN ASPART 100 UNIT/ML ~~LOC~~ SOLN
0.0000 [IU] | SUBCUTANEOUS | Status: DC
  Administered 2014-11-25 – 2014-11-26 (×3): 2 [IU] via SUBCUTANEOUS
  Filled 2014-11-25 (×4): qty 2

## 2014-11-25 MED ORDER — OXYCODONE-ACETAMINOPHEN 5-325 MG PO TABS: 2.0000 | ORAL_TABLET | Freq: Four times a day (QID) | ORAL | Status: DC | PRN

## 2014-11-25 MED ORDER — AMOXICILLIN-POT CLAVULANATE 875-125 MG PO TABS: 1.0000 | ORAL_TABLET | Freq: Two times a day (BID) | ORAL | Status: AC

## 2014-11-25 MED ORDER — SODIUM CHLORIDE 0.9 % IV SOLN
INTRAVENOUS | Status: DC
  Administered 2014-11-27: 13:00:00 via INTRAVENOUS

## 2014-11-25 MED ORDER — IPRATROPIUM-ALBUTEROL 0.5-2.5 (3) MG/3ML IN SOLN: 3.0000 mL | Freq: Four times a day (QID) | RESPIRATORY_TRACT | Status: DC | PRN

## 2014-11-25 MED ORDER — PREDNISONE 50 MG PO TABS: ORAL_TABLET | ORAL | Status: DC

## 2014-11-25 MED ORDER — ALPRAZOLAM 0.5 MG PO TABS: 0.5000 mg | ORAL_TABLET | Freq: Three times a day (TID) | ORAL | Status: DC | PRN

## 2014-11-25 MED ORDER — FAMOTIDINE IN NACL 20-0.9 MG/50ML-% IV SOLN
20.0000 mg | Freq: Two times a day (BID) | INTRAVENOUS | Status: DC
  Administered 2014-11-25 – 2014-11-29 (×7): 20 mg via INTRAVENOUS
  Filled 2014-11-25 (×13): qty 50

## 2014-11-25 NOTE — Discharge Summary (Addendum)
[facility] [place] NAME: [name]    MR#:  [id number]  DATE OF BIRTH:  1940/09/14  DATE OF ADMISSION:  11/18/2014 ADMITTING PHYSICIAN: Saundra Shelling, MD  DATE OF DISCHARGE: 11/30/2014  PRIMARY CARE PHYSICIAN: [name], MD    ADMISSION DIAGNOSIS:  CAP (community acquired pneumonia) [J18.9] Chronic upper extremity pain, unspecified laterality [M79.609, G89.29]  DISCHARGE DIAGNOSIS:  Principal Problem:   Community acquired pneumonia Active Problems:   Uncontrolled hypertension   Pneumonia   SECONDARY DIAGNOSIS:   Past Medical History  Diagnosis Date  . Stroke ([place])   . Hypertension   . TIA (transient ischemic attack)     HOSPITAL COURSE:   #1 community-acquired pneumonia: This was her original diagnosis. Now resolved.Blood cultures negative. Has completed course of treatment. finished antibiotics  #2 acute hypoxia with respiratory failure due to pneumonia: Resolved Continue to wean oxygen. Nebulizers as needed. CT of the chest is negative for pulmonary emboli. 2-D echocardiogram 11/13 shows ejection fraction [id number], no diastolic dysfunction is noted.  #3 acute blood loss anemia: Due to rectus sheath hematoma. Have stopped Lovenox. Status post 2 units of packed red blood cells transfusion on 11/22. Belly binder to decrease additional accumulation of blood.  #4 history of CVA with residual left-sided hemiparesis: Stable no changes. Continue Aggrenox and statin. She does have post stroke pain. Continue tramadol and oxycodone as needed as well as Neurontin.. Hold Zanaflex as she has become acutely hypotensive and nonresponsive each time we have given that medication.  #5 anxiety and depression: She has spoken with the chaplain. Continue Xanax and Celexa.  #6 thyroid nodule: Seen on ultrasound. May need FNA or biopsy after discharge. TSH and T4 are normal.  #7 hyperglycemia: Due to  steroids which were used towards the beginning of this admission to treat reactive airway. That sugars well controlled with sliding scale insulin. Prednisone discontinued and I suspect blood sugars will be normal going forward  #8 hypertension: Blood pressure has been well controlled on current regimen , but does tend to go up as patient when she is in pain. And consider using hydralazine IV as needed or when necessary.  Also can  Use PO.   DISCHARGE CONDITIONS:   fair  CONSULTS OBTAINED:     none  DRUG ALLERGIES:   Allergies  Allergen Reactions  . Ace Inhibitors Cough  . Latex Hives  . Tetracycline Other (See Comments)    Reaction:  Unknown     DISCHARGE MEDICATIONS:   Discharge Medication List as of 11/30/2014  3:50 PM    START taking these medications   Details  famotidine (PEPCID) 20 MG tablet Take 1 tablet (20 mg total) by mouth 2 (two) times daily., Starting 11/30/2014, Until Discontinued, Normal    hydrocortisone cream 1 % Apply topically 2 (two) times daily., Starting 11/30/2014, Until Discontinued, Normal    ipratropium-albuterol (DUONEB) 0.5-2.5 (3) MG/3ML SOLN Take 3 mLs by nebulization every 6 (six) hours as needed., Starting 11/25/2014, Until Discontinued, Normal    predniSONE (DELTASONE) 50 MG tablet One tablet daily for 5 days and then stop., Normal    senna-docusate (SENOKOT-S) 8.6-50 MG tablet Take 2 tablets by mouth 2 (two) times daily., Starting 11/30/2014, Until Discontinued, Normal      CONTINUE these medications which have CHANGED   Details  ALPRAZolam (XANAX) 0.5 MG tablet Take 1 tablet (0.5 mg total) by mouth 3 (three) times daily as needed for anxiety., Starting  11/30/2014, Until Discontinued, Print    oxyCODONE-acetaminophen (PERCOCET/ROXICET) 5-325 MG tablet Take 2 tablets by mouth every 6 (six) hours as needed for severe pain., Starting 11/30/2014, Until Discontinued, Print    traMADol (ULTRAM) 50 MG tablet Take 1 tablet (50 mg total) by mouth  every 8 (eight) hours as needed for moderate pain., Starting 11/30/2014, Until Discontinued, Normal      CONTINUE these medications which have NOT CHANGED   Details  acetaminophen (TYLENOL) 500 MG tablet Take 500 mg by mouth every 6 (six) hours as needed for mild pain., Until Discontinued, Historical Med    amLODipine (NORVASC) 5 MG tablet Take 5 mg by mouth daily., Until Discontinued, Historical Med    Calcium Carbonate-Vitamin D (CALCIUM 600+D) 600-400 MG-UNIT tablet Take 2 tablets by mouth 2 (two) times daily., Until Discontinued, Historical Med    carvedilol (COREG) 3.125 MG tablet Take 3.125 mg by mouth 2 (two) times daily., Until Discontinued, Historical Med    citalopram (CELEXA) 20 MG tablet Take 20 mg by mouth daily., Until Discontinued, Historical Med    dipyridamole-aspirin (AGGRENOX) 200-25 MG 12hr capsule Take 1 capsule by mouth 2 (two) times daily., Until Discontinued, Historical Med    gabapentin (NEURONTIN) 300 MG capsule Take 300 mg by mouth 3 (three) times daily., Until Discontinued, Historical Med    Guaifenesin (MUCINEX MAXIMUM STRENGTH) 1200 MG TB12 Take 1,200 mg by mouth 2 (two) times daily., Until Discontinued, Historical Med    hydrochlorothiazide (HYDRODIURIL) 25 MG tablet Take 25 mg by mouth daily., Until Discontinued, Historical Med    losartan (COZAAR) 50 MG tablet Take 50 mg by mouth 2 (two) times daily., Until Discontinued, Historical Med    lovastatin (MEVACOR) 20 MG tablet Take 20 mg by mouth at bedtime., Until Discontinued, Historical Med    omeprazole (PRILOSEC) 20 MG capsule Take 20 mg by mouth daily., Until Discontinued, Historical Med    oxybutynin (DITROPAN) 5 MG tablet Take 5 mg by mouth 2 (two) times daily., Until Discontinued, Historical Med    tiZANidine (ZANAFLEX) 4 MG tablet Take 4 mg by mouth 4 (four) times daily., Until Discontinued, Historical Med        DISCHARGE INSTRUCTIONS:    DIET:  Cardiac diet and Low fat, Low cholesterol  diet  DISCHARGE CONDITION:  Fair  ACTIVITY:  Activity as tolerated and per PT recomendations  OXYGEN:  Home Oxygen: No.   Oxygen Delivery: NONE  DISCHARGE LOCATION:  nursing home   If you experience worsening of your admission symptoms, develop shortness of breath, life threatening emergency, suicidal or homicidal thoughts you must seek medical attention immediately by calling 911 or calling your MD immediately  if symptoms less severe.  You Must read complete instructions/literature along with all the possible adverse reactions/side effects for all the Medicines you take and that have been prescribed to you. Take any new Medicines after you have completely understood and accpet all the possible adverse reactions/side effects.   Please note  You were cared for by a hospitalist during your hospital stay. If you have any questions about your discharge medications or the care you received while you were in the hospital after you are discharged, you can call the unit and asked to speak with the hospitalist on call if the hospitalist that took care of you is not available. Once you are discharged, your primary care physician will handle any further medical issues. Please note that NO REFILLS for any discharge medications will be authorized once you are discharged,  as it is imperative that you return to your primary care physician (or establish a relationship with a primary care physician if you do not have one) for your aftercare needs so that they can reassess your need for medications and monitor your lab values.    Today   CHIEF COMPLAINT:   Chief Complaint  Patient presents with  . Cough    HISTORY OF PRESENT ILLNESS:  [name] is a 74 y.o. female with a known history of CVA with left hemiparesis, hypertension pressure presented to the emergency room with cough and shortness of breath. Patient recently completed 14 days of oral antibiotics prescribed by her primary care  physician for pneumonia. Patient after finishing the antibiotics still has shortness of breath and cough. Patient is not able to spit up anything when she coughs. No fever or chills. Has generalized weakness. Patient was worked up in the emergency room and was found to have bilateral pneumonia on chest x-ray. No history of any hemoptysis or any hematemesis. No history of any recent travel or any sick contacts at home. No history of any orthopnea or any paroxysmal nocturnal dyspnea. No complaints of any chest pain.  She is feeling much better and is being discharged to [facility] rehabilitation today in stable condition.  She is agreeable with the discharge plans. VITAL SIGNS:  Blood pressure 146/81, pulse 84, temperature 97.6 F (36.4 C), temperature source Oral, resp. rate 20, height 5\' 1"  (1.549 m), weight 85.322 kg (188 lb 1.6 oz), SpO2 93 %. PHYSICAL EXAMINATION:  GENERAL:  74 y.o.-year-old patient lying in the bed with no acute distress.  LUNGS: Normal breath sounds bilaterally, no wheezing, rales,rhonchi or crepitation. No use of accessory muscles of respiration. Good air movement CARDIOVASCULAR: S1, S2 normal. No murmurs, rubs, or gallops.  ABDOMEN: Soft, non-tender, non-distended. Bowel sounds present. No organomegaly or mass.  EXTREMITIES: No pedal edema, cyanosis, or clubbing.  NEUROLOGIC: dense left sided hemiparesis PSYCHIATRIC: The patient is alert and oriented x 3. Tearful and anxious SKIN: No obvious rash, lesion, or ulcer.  DATA REVIEW:   CBC  Recent Labs Lab 11/30/14 0504  WBC 14.5*  HGB 11.3*  HCT 33.6*  PLT 253    Chemistries   Recent Labs Lab 11/30/14 0504  NA 130*  K 4.3  CL 96*  CO2 27  GLUCOSE 86  BUN 24*  CREATININE 0.54  CALCIUM 9.9    Cardiac Enzymes No results for input(s): TROPONINI in the last 168 hours.  Microbiology Results  Results for orders placed or performed during the hospital encounter of 11/18/14  Blood culture (routine x 2)      Status: None   Collection Time: 11/18/14  8:09 PM  Result Value Ref Range Status   Specimen Description BLOOD RIGHT WRIST  Final   Special Requests BOTTLES DRAWN AEROBIC AND ANAEROBIC 4CC  Final   Culture NO GROWTH 5 DAYS  Final   Report Status 11/23/2014 FINAL  Final  Blood culture (routine x 2)     Status: None   Collection Time: 11/18/14  8:56 PM  Result Value Ref Range Status   Specimen Description BLOOD RIGHT ASSIST CONTROL  Final   Special Requests BOTTLES DRAWN AEROBIC AND ANAEROBIC 5CC  Final   Culture NO GROWTH 5 DAYS  Final   Report Status 11/23/2014 FINAL  Final  Culture, blood (routine x 2)     Status: None   Collection Time: 11/25/14  5:50 PM  Result Value Ref Range Status  Specimen Description BLOOD RIGHT HAND  Final   Special Requests BOTTLES DRAWN AEROBIC AND ANAEROBIC  1CC  Final   Culture NO GROWTH 5 DAYS  Final   Report Status 11/30/2014 FINAL  Final  Culture, blood (routine x 2)     Status: None   Collection Time: 11/25/14  6:47 PM  Result Value Ref Range Status   Specimen Description BLOOD RIGHT HAND  Final   Special Requests BAA,5ML,ANA,AER  Final   Culture NO GROWTH 5 DAYS  Final   Report Status 11/30/2014 FINAL  Final  Urine culture     Status: None   Collection Time: 11/28/14  5:59 PM  Result Value Ref Range Status   Specimen Description URINE, RANDOM  Final   Special Requests Normal  Final   Culture INSIGNIFICANT GROWTH  Final   Report Status 11/30/2014 FINAL  Final    RADIOLOGY:  No results found.  Management plans discussed with the patient, family and they are in agreement.  CODE STATUS: full code  Palliative care did evaluate the patient, but they were not quite ready to change her CODE STATUS.  Consider palliative care consultation while at the facility if need arises.  She is at very high risk for readmission.  TOTAL TIME TAKING CARE OF THIS PATIENT: 55 minutes.   Greater than 50% of time spent in care coordination and  counseling.  [facility], Terrin Imparato M.D on 12/06/2014 at 11:33 AM  Between 7am to 6pm - Pager - [phone]  After 6pm go to [email or web address] - password [email or web address] [place] [facility]  Office  [phone]  CC: Primary care physician; [name], MD

## 2014-11-25 NOTE — Progress Notes (Signed)
Paged for rapid response. On arrival, nursing staff present in room. Patient minimally responsive to sternal rub given by another staff member. At times breathing shallow, however during rapid response RR regular. Patient pale, lightly diaphoretic. MD paged and responded. 500 cc NS bolus initiated. Patient transferred to ICU.

## 2014-11-25 NOTE — Progress Notes (Signed)
   11/25/14 1500  Clinical Encounter Type  Visited With Patient;Health care provider  Visit Type Code  Referral From Nurse  Consult/Referral To Chaplain  Spiritual Encounters  Spiritual Needs Emotional  Stress Factors  Patient Stress Factors None identified;Other (Comment)  Chaplain rounded in the unit and received a notification that a code page was going to occur. I went to visit to provide support and comfort to the patient and staff. Patient was then moved to CCU. Chaplain Bellamy Judson A. Monae Topping Ext. [phone]

## 2014-11-25 NOTE — Progress Notes (Signed)
Went to check on patient for lunch and found the patient sleeping. When attempting to wake the patient up, did not get a response. Heart monitor showing pulse at 52. Patient's baseline had been in the 80's. Patient did not respond to sternal rub. Was able to obtain a pulse. Went to nurses station to get help and [employer]. BP at 1325 was 61/37 HR 53 respirations at 12. Pulse ox was 87% on 2L. O2 transitioned to 4L, oxygen saturation came up to mid 90's. Femoral pulse palpated by 2 RN's. Normal saline bolus started. Dr. Volanda Napoleon notified. MD stated to keep bolus going and to transfer to step down and she will meet [place] in the room. Patient transported immediately, report given at bedside to RN and accepting MD. Pressure has started to stabilize. Husband was called and notified of transfer, states he will come to the hospital shortly. Patient is now in CCU 2. Belongings sent to room.

## 2014-11-25 NOTE — Progress Notes (Signed)
   11/25/14 1026  Clinical Encounter Type  Visited With Patient  Visit Type Initial;Spiritual support;Social support  Referral From Physician  Consult/Referral To Chaplain  Spiritual Encounters  Spiritual Needs Prayer;Emotional  Stress Factors  Patient Stress Factors Family relationships  Chaplain visited with patient and provided a compassionate presence, pastoral care, and emotional support. Patient appears to be sadden by the lack of support from family and desires simple human connections that will help her feel the love she is lacking from family.   Chaplain Emran Molzahn [phone]

## 2014-11-25 NOTE — Progress Notes (Signed)
Patient's HR has remained in the 70s - 90s since last dose of labetalol. She rested quietly for the remainder of the night. Nursing staff will continue to monitor. Earleen Reaper, RN

## 2014-11-25 NOTE — Care Management (Signed)
patient was transferred to icu when had sudden onset of altered mental status and hypotension.  Was to have discharged to skilled nursing facility today

## 2014-11-25 NOTE — Progress Notes (Signed)
Patient's HR jumped up into the 150s - 170s again tonight. RN assessed the patient and the patient's only complaints were a headache and being unable to sleep. BP was checked twice in two different areas and the results were the following: L Leg 199/89 and R Arm 188/115 with HR in the 150s-160s upon BP assessment. Gave PRN dose of 10 mg IV Labetalol and patient's HR trended down into the 80s - 90s. Nursing staff will continue to monitor. Earleen Reaper, RN

## 2014-11-25 NOTE — Care Management (Signed)
Had some issues with heart rate during the night.  If appears stable within the next few hours- will dc to snf.  If not, will anticipate 11/19

## 2014-11-25 NOTE — Progress Notes (Signed)
Patient and husband selected SNF bed at [facility].  CSW obtained Auth from [facility]                      ([id number] is good until Monday.  Will need new auth after Monday.    CSW informed that patient has been moved to ICU and will not discharge today. Call to [facility] to inform them patient will not discharge today.  Per RN she has called patient's husband and informed him.  CSW will continue to follow patient to assist with discharge needs to SNF when patient is medically ready to discharge.    Casimer Lanius. Latanya Presser, MSW Clinical Social Work Department [phone] 2:30 PM

## 2014-11-25 NOTE — Progress Notes (Signed)
Had hoped for discharge today. She appeared stable this morning with good vitals and no new complaints. Unfortunately this afternoon she became abruptly unresponsive hypotensive and bradycardic. She was transferred to the intensive care unit. She did improve with IV fluid bolus. Currently vitals stable once again but with increased pain on the left side. CT scan shows no acute findings. We will monitor in the ICU overnight. Conservative management of pain. She was very confused and unable to speak immediately following the event described above. Now she seems back to her baseline. I did increase her Coreg this morning, this may have contributed. Now back to lower dose Coreg. Will monitor overnight.

## 2014-11-25 NOTE — Consult Note (Signed)
[facility] Consultation   74 yo female admitted with pneumonia, transferred to ICU acutely with AMS, hypotension, bradycardia.   ASSESSMENT/PLAN    PULMONARY  A:Acute respiratory failure --Likely due to pneumonia, and hypotension, now doing better.  --Pulmonary hypertension suggested by CT chest.   P:   Abx as prescribed.   CARDIOVASCULAR  A: Hypotension and bradycardia.  P:  Possibly due to beta-blockade? This appears to be improving, will continue to monitor.   RENAL --  GASTROINTESTINAL A:  Hiatal hernia, intrathoracic stomach.  P:   GI prophylaxis.   HEMATOLOGIC --  INFECTIOUS A:  Pneumonia Continue abx.   ENDOCRINE Monitor glucose.   NEUROLOGIC A:  Altered mental status.  P:   Uncertain etiology, will check CT head.     ---------------------------------------  ---------------------------------------   Name: [name] MRN: [id number] DOB: 07-25-1940    ADMISSION DATE:  11/18/2014 CONSULTATION DATE:  11/25/14  REFERRING MD :  Dr. Volanda Napoleon.   CHIEF COMPLAINT: respiratory failure.    HISTORY OF PRESENT ILLNESS:  The patient is unresponsive, therefore all history was obtained from the chart, and from staff.   The patient was initially admitted with pneumonia with wheezing. She was treated with abx, and with steroids. She had improved and was planned to be discharged today from the floor.  Early this am, the pt's HR increased into the 150 range and her BP was elevated as well. She had a similar episode that occurred the previous night, which was treated with IV cardizem with improvement.   At this time, it was noted that she was having diminshed responsiveness earlier today, this coincided with bradycardia in the 50's and hypotension with SBP of 63/35 and hypoxia. She was given increased amounts of oxygen and transferred to the ICU.  She was given  doses of norvasc and coreg this am. Her coreg had been increased  from the previous dose to better control BP and tachycardia. Losartan was decreased from previous dose. She is also on home doses of ditropan and zanaflex.   PAST MEDICAL HISTORY :  Past Medical History  Diagnosis Date  . Stroke ([place])   . Hypertension   . TIA (transient ischemic attack)    Past Surgical History  Procedure Laterality Date  . Abdominal hysterectomy    . Appendectomy     Prior to Admission medications   Medication Sig Start Date End Date Taking? Authorizing Provider  acetaminophen (TYLENOL) 500 MG tablet Take 500 mg by mouth every 6 (six) hours as needed for mild pain.   Yes Historical Provider, MD  amLODipine (NORVASC) 5 MG tablet Take 5 mg by mouth daily.   Yes Historical Provider, MD  Calcium Carbonate-Vitamin D (CALCIUM 600+D) 600-400 MG-UNIT tablet Take 2 tablets by mouth 2 (two) times daily.   Yes Historical Provider, MD  carvedilol (COREG) 3.125 MG tablet Take 3.125 mg by mouth 2 (two) times daily.   Yes Historical Provider, MD  citalopram (CELEXA) 20 MG tablet Take 20 mg by mouth daily.   Yes Historical Provider, MD  dipyridamole-aspirin (AGGRENOX) 200-25 MG 12hr capsule Take 1 capsule by mouth 2 (two) times daily.   Yes Historical Provider, MD  gabapentin (NEURONTIN) 300 MG capsule Take 300 mg by mouth 3 (three) times daily.   Yes Historical Provider, MD  Guaifenesin (MUCINEX MAXIMUM STRENGTH) 1200 MG TB12 Take 1,200 mg by mouth 2 (two) times daily.   Yes Historical Provider, MD  hydrochlorothiazide (HYDRODIURIL) 25 MG tablet Take  25 mg by mouth daily.   Yes Historical Provider, MD  losartan (COZAAR) 50 MG tablet Take 50 mg by mouth 2 (two) times daily.   Yes Historical Provider, MD  lovastatin (MEVACOR) 20 MG tablet Take 20 mg by mouth at bedtime.   Yes Historical Provider, MD  omeprazole (PRILOSEC) 20 MG capsule Take 20 mg by mouth daily.   Yes Historical Provider, MD  oxybutynin (DITROPAN) 5 MG tablet Take 5 mg by mouth 2 (two) times daily.   Yes Historical  Provider, MD  tiZANidine (ZANAFLEX) 4 MG tablet Take 4 mg by mouth 4 (four) times daily.   Yes Historical Provider, MD  traMADol (ULTRAM) 50 MG tablet Take 50 mg by mouth every 8 (eight) hours as needed for moderate pain.   Yes Historical Provider, MD  ALPRAZolam Duanne Moron) 0.5 MG tablet Take 1 tablet (0.5 mg total) by mouth 3 (three) times daily as needed for anxiety. 11/25/14   Aldean Jewett, MD  amoxicillin-clavulanate (AUGMENTIN) 875-125 MG tablet Take 1 tablet by mouth every 12 (twelve) hours. 11/25/14 11/28/14  Aldean Jewett, MD  ipratropium-albuterol (DUONEB) 0.5-2.5 (3) MG/3ML SOLN Take 3 mLs by nebulization every 6 (six) hours as needed. 11/25/14   Aldean Jewett, MD  oxyCODONE-acetaminophen (PERCOCET/ROXICET) 5-325 MG tablet Take 2 tablets by mouth every 6 (six) hours as needed for severe pain. 11/25/14   Aldean Jewett, MD  predniSONE (DELTASONE) 50 MG tablet One tablet daily for 5 days and then stop. 11/25/14   Aldean Jewett, MD   Allergies  Allergen Reactions  . Ace Inhibitors Cough  . Latex Hives  . Tetracycline Other (See Comments)    Reaction:  Unknown     FAMILY HISTORY:  History reviewed. No pertinent family history. SOCIAL HISTORY:  reports that she has never smoked. She does not have any smokeless tobacco history on file. She reports that she does not drink alcohol or use illicit drugs.  REVIEW OF SYSTEMS:   Pt is currently unresponsive and can not provide a ros.    VITAL SIGNS: Temp:  [97.6 F (36.4 C)-98.5 F (36.9 C)] 98.2 F (36.8 C) (11/18 1140) Pulse Rate:  [47-163] 58 (11/18 1332) Resp:  [22-24] 24 (11/18 0412) BP: (61-199)/(35-115) 63/35 mmHg (11/18 1326) SpO2:  [93 %-96 %] 96 % (11/18 1332) FiO2 (%):  [28 %] 28 % (11/17 1505) HEMODYNAMICS:   VENTILATOR SETTINGS: Vent Mode:  [-]  FiO2 (%):  [28 %] 28 % INTAKE / OUTPUT:  Intake/Output Summary (Last 24 hours) at 11/25/14 1414 Last data filed at 11/25/14 1033  Gross per 24 hour    Intake      3 ml  Output    650 ml  Net   -647 ml    Physical Examination:   VS: BP 63/35 mmHg  Pulse 58  Temp(Src) 98.2 F (36.8 C) (Oral)  Resp 24  Ht 5\' 1"  (1.549 m)  Wt 160 lb (72.576 kg)  BMI 30.25 kg/m2  SpO2 96%  General Appearance: No distress  Neuro:respond slightly to sternal rub.   HEENT: PERRLA, EOM intact, no ptosis,Pulmonary: normal breath sounds., diaphragmatic excursion normal.No wheezing, No rales;     CardiovascularNormal S1,S2.  No m/r/g.    Abdomen: Benign, Soft, non-tender, No masses, hepatosplenomegaly, No lymphadenopathy Renal:  No costovertebral tenderness  GU:  Not performed at this time. Endoc: No evident thyromegaly, no signs of acromegaly. Skin:   warm, no rashes, no ecchymosis  Extremities: normal, no cyanosis, clubbing, no edema,  warm with normal capillary refill.    LABS: Reviewed   LABORATORY PANEL:   CBC  Recent Labs Lab 11/19/14 0417  WBC 5.9  HGB 13.5  HCT 38.6  PLT 223    Chemistries   Recent Labs Lab 11/18/14 1821 11/19/14 0417 11/21/14 1310  11/23/14 0354 11/25/14 0455  NA 131* 133*  --   --   --   --   K 3.3* 3.2* 3.3*  < > 3.7  --   CL 92* 95*  --   --   --   --   CO2 31 26  --   --   --   --   GLUCOSE 107* 164*  --   --   --   --   BUN 9 7  --   --   --   --   CREATININE 0.73 0.64  --   --   --  0.75  CALCIUM 10.2 9.9  --   --   --   --   MG  --   --  1.9  --   --   --   AST 28  --   --   --   --   --   ALT 19  --   --   --   --   --   ALKPHOS 50  --   --   --   --   --   BILITOT 0.3  --   --   --   --   --   < > = values in this interval not displayed.   Recent Labs Lab 11/22/14 0720 11/25/14 1340  GLUCAP 142* 204*   No results for input(s): PHART, PCO2ART, PO2ART in the last 168 hours.  Recent Labs Lab 11/18/14 1821  AST 28  ALT 19  ALKPHOS 50  BILITOT 0.3  ALBUMIN 4.3    Cardiac Enzymes  Recent Labs Lab 11/18/14 1821  TROPONINI <0.03    RADIOLOGY:  Ct Angio Chest Pe W/cm &/or  Wo Cm  11/23/2014  CLINICAL DATA:  Cough, shortness of breath. EXAM: CT ANGIOGRAPHY CHEST WITH CONTRAST TECHNIQUE: Multidetector CT imaging of the chest was performed using the standard protocol during bolus administration of intravenous contrast. Multiplanar CT image reconstructions and MIPs were obtained to evaluate the vascular anatomy. CONTRAST:  32mL OMNIPAQUE IOHEXOL 350 MG/ML SOLN COMPARISON:  Chest radiograph of November 20, 2014. FINDINGS: No pneumothorax or pleural effusion is noted. Minimal subsegmental atelectasis is noted posteriorly in both lung bases. There is no evidence of thoracic aortic dissection or aneurysm. Moderate sliding-type hiatal hernia is noted. There is no evidence of pulmonary embolus. Large left thyroid mass measuring 4.8 x 4.1 cm is noted. Mild compression deformity is noted in upper thoracic vertebral body consistent with old fracture. Within the visualized portion of upper abdomen, 2.5 cm left adrenal nodule is noted. Review of the MIP images confirms the above findings. IMPRESSION: No evidence of pulmonary embolus. Moderate sliding-type hiatal hernia. 4.8 cm left thyroid mass is noted. Thyroid ultrasound is recommended for further evaluation. 2.5 cm left adrenal nodule is noted. Follow-up CT or MRI in 12 months is recommended to ensure stability. Electronically Signed   By: Marijo Conception, M.D.   On: 11/23/2014 15:15   US Soft Tissue Head/neck  11/24/2014  CLINICAL DATA:  Nodule noted on CT EXAM: THYROID ULTRASOUND TECHNIQUE: Ultrasound examination of the thyroid gland and adjacent soft tissues was performed. COMPARISON:  None. FINDINGS:  Right thyroid lobe Measurements: 3.2 x 1.4 x 1.2 cm.  No nodules visualized. Left thyroid lobe Measurements: 5.6 x 3.9 x 3.6 cm. Large mass occupies most of the lobe and measures 4.4 x 4.4 x 3.5 cm. It is predominantly solid with some cystic elements. Isthmus Thickness: 4 mm.  No nodules visualized. Lymphadenopathy None visualized.  IMPRESSION: Solitary mass in the left lobe measures 4.4 cm. Findings meet consensus criteria for biopsy. Ultrasound-guided fine needle aspiration should be considered, as per the consensus statement: Management of Thyroid Nodules Detected at [place]: Society of Radiologists in [place]. Radiology 2005; [id number]. Electronically Signed   By: Marybelle Killings M.D.   On: 11/24/2014 16:41       --Marda Stalker, MD.  Board Certified in Internal Medicine, Pulmonary Medicine, [place], and Sleep Medicine.  Pager [phone] [facility]   Patricia Pesa, M.D.  Vilinda Boehringer, M.D.  Merton Border, M.D   11/25/2014, 2:14 PM   [place].  I have personally obtained a history, examined the patient, evaluated laboratory and imaging results, formulated the assessment and plan and placed orders. The Patient requires high complexity decision making for assessment and support, frequent evaluation and titration of therapies, application of advanced monitoring technologies and extensive interpretation of multiple databases. The patient has critical illness that could lead imminently to failure of 1 or more organ systems and requires the highest level of physician preparedness to intervene.  Critical Care Time devoted to patient care services described in this note is 35 minutes and is exclusive of time spent in procedures.

## 2014-11-26 ENCOUNTER — Inpatient Hospital Stay: Payer: PPO

## 2014-11-26 LAB — CBC
HEMATOCRIT: 26.1 % — AB (ref 35.0–47.0)
HEMOGLOBIN: 8.8 g/dL — AB (ref 12.0–16.0)
MCH: 31 pg (ref 26.0–34.0)
MCHC: 33.8 g/dL (ref 32.0–36.0)
MCV: 91.7 fL (ref 80.0–100.0)
Platelets: 241 10*3/uL (ref 150–440)
RBC: 2.85 MIL/uL — AB (ref 3.80–5.20)
RDW: 12.4 % (ref 11.5–14.5)
WBC: 18.6 10*3/uL — ABNORMAL HIGH (ref 3.6–11.0)

## 2014-11-26 LAB — BASIC METABOLIC PANEL
Anion gap: 8 (ref 5–15)
BUN: 38 mg/dL — AB (ref 6–20)
CHLORIDE: 93 mmol/L — AB (ref 101–111)
CO2: 30 mmol/L (ref 22–32)
Calcium: 9.9 mg/dL (ref 8.9–10.3)
Creatinine, Ser: 0.78 mg/dL (ref 0.44–1.00)
GFR calc Af Amer: >60 mL/min (ref >60)
GFR calc non Af Amer: >60 mL/min (ref >60)
Glucose, Bld: 103 mg/dL — ABNORMAL HIGH (ref 65–99)
POTASSIUM: 3.6 mmol/L (ref 3.5–5.1)
SODIUM: 131 mmol/L — AB (ref 135–145)

## 2014-11-26 LAB — GLUCOSE, CAPILLARY
GLUCOSE-CAPILLARY: 144 mg/dL — AB (ref 65–99)
GLUCOSE-CAPILLARY: 134 mg/dL — AB (ref 65–99)
Glucose-Capillary: 104 mg/dL — ABNORMAL HIGH (ref 65–99)
Glucose-Capillary: 122 mg/dL — ABNORMAL HIGH (ref 65–99)
Glucose-Capillary: 136 mg/dL — ABNORMAL HIGH (ref 65–99)
Glucose-Capillary: 89 mg/dL (ref 65–99)
Glucose-Capillary: 99 mg/dL (ref 65–99)

## 2014-11-26 LAB — T4, FREE: FREE T4: 0.91 ng/dL (ref 0.61–1.12)

## 2014-11-26 LAB — TSH: TSH: 0.834 u[IU]/mL (ref 0.350–4.500)

## 2014-11-26 MED ORDER — DOCUSATE SODIUM 100 MG PO CAPS
100.0000 mg | ORAL_CAPSULE | Freq: Two times a day (BID) | ORAL | Status: DC
  Administered 2014-11-26: 100 mg via ORAL
  Filled 2014-11-26: qty 1

## 2014-11-26 MED ORDER — POLYETHYLENE GLYCOL 3350 17 G PO PACK
17.0000 g | PACK | Freq: Every day | ORAL | Status: DC
  Filled 2014-11-26: qty 1

## 2014-11-26 MED ORDER — INSULIN ASPART 100 UNIT/ML ~~LOC~~ SOLN
0.0000 [IU] | Freq: Three times a day (TID) | SUBCUTANEOUS | Status: DC
  Administered 2014-11-28 – 2014-11-29 (×2): 1 [IU] via SUBCUTANEOUS
  Filled 2014-11-26: qty 1
  Filled 2014-11-26: qty 2
  Filled 2014-11-26: qty 1

## 2014-11-26 MED ORDER — INSULIN ASPART 100 UNIT/ML ~~LOC~~ SOLN
0.0000 [IU] | Freq: Every day | SUBCUTANEOUS | Status: DC
Start: 2014-11-26 — End: 2014-11-30

## 2014-11-26 MED ORDER — HYDROCORTISONE 1 % EX CREA
TOPICAL_CREAM | Freq: Two times a day (BID) | CUTANEOUS | Status: DC
  Administered 2014-11-28 – 2014-11-30 (×5): via TOPICAL
  Filled 2014-11-26: qty 28

## 2014-11-26 MED ORDER — POLYETHYLENE GLYCOL 3350 17 G PO PACK: 17.0000 g | PACK | Freq: Every day | ORAL | Status: DC

## 2014-11-26 NOTE — Progress Notes (Addendum)
[facility] [place] NAME: [name]    MR#:  [id number]  DATE OF BIRTH:  Apr 07, 1940  SUBJECTIVE:  CHIEF COMPLAINT:   Chief Complaint  Patient presents with  . Cough   Feeling much better today. All alert. Conversant. No complaints.  REVIEW OF SYSTEMS:   Review of Systems  Constitutional: Negative for fever.  Respiratory: Negative for shortness of breath.   Cardiovascular: Negative for chest pain and palpitations.  Gastrointestinal: Negative for nausea, vomiting and abdominal pain.  Genitourinary: Negative for dysuria.    DRUG ALLERGIES:   Allergies  Allergen Reactions  . Ace Inhibitors Cough  . Latex Hives  . Tetracycline Other (See Comments)    Reaction:  Unknown     VITALS:  Blood pressure 137/68, pulse 84, temperature 99.2 F (37.3 C), temperature source Oral, resp. rate 17, height 5\' 1"  (1.549 m), weight 72.576 kg (160 lb), SpO2 98 %.  PHYSICAL EXAMINATION:  GENERAL:  74 y.o.-year-old patient lying in the bed with no acute distress.  EYES: Pupils equal, round, reactive to light and accommodation. No scleral icterus. Extraocular muscles intact.  HEENT: Head atraumatic, normocephalic. Oropharynx and nasopharynx clear.  NECK:  Supple, no jugular venous distention. No thyroid enlargement, no tenderness.  LUNGS: Normal breath sounds bilaterally, no wheezing, rales,rhonchi or crepitation. No use of accessory muscles of respiration.  CARDIOVASCULAR: S1, S2 normal. 3 out of 6 murmurs, rubs, or gallops.  ABDOMEN: Soft, nontender, nondistended. Bowel sounds present. No organomegaly or mass.  EXTREMITIES: No pedal edema, cyanosis, or clubbing.  NEUROLOGIC: Left-sided hemiparesis  PSYCHIATRIC: The patient is alert and oriented x 3.  SKIN: No obvious rash, lesion, or ulcer.    LABORATORY PANEL:   CBC  Recent Labs Lab 11/26/14 0526  WBC 18.6*  HGB 8.8*  HCT 26.1*  PLT 241    ------------------------------------------------------------------------------------------------------------------  Chemistries   Recent Labs Lab 11/21/14 1310  11/25/14 1356 11/26/14 0526  NA  --   < > 129* 131*  K 3.3*  < > 3.6 3.6  CL  --   < > 91* 93*  CO2  --   < > 31 30  GLUCOSE  --   < > 162* 103*  BUN  --   < > 35* 38*  CREATININE  --   < > 1.09* 0.78  CALCIUM  --   < > 10.3 9.9  MG 1.9  --   --   --   AST  --   --  24  --   ALT  --   --  25  --   ALKPHOS  --   --  31*  --   BILITOT  --   --  0.6  --   < > = values in this interval not displayed. ------------------------------------------------------------------------------------------------------------------  Cardiac Enzymes No results for input(s): TROPONINI in the last 168 hours. ------------------------------------------------------------------------------------------------------------------  RADIOLOGY:  Dg Chest 1 View  11/26/2014  CLINICAL DATA:  Dyspnea EXAM: CHEST 1 VIEW COMPARISON:  11/25/2014 FINDINGS: Elevated right hemidiaphragm unchanged. Lungs remain clear without evidence of pneumonia or heart failure. No effusion. IMPRESSION: No active disease. Electronically Signed   By: Franchot Gallo M.D.   On: 11/26/2014 08:02   Ct Head Wo Contrast  11/25/2014  CLINICAL DATA:  Unresponsive, oxygen saturation 87% on 2 L, hypertension, history stroke EXAM: CT HEAD WITHOUT CONTRAST TECHNIQUE: Contiguous axial images were obtained from the base of the skull through the vertex without  intravenous contrast. COMPARISON:  03/10/2014 FINDINGS: Generalized atrophy. Normal ventricular morphology. No midline shift or mass effect. Extensive small vessel chronic ischemic changes of deep cerebral white matter. Old RIGHT basal ganglia lacunar infarct. No intracranial hemorrhage, mass lesions, or evidence acute infarction. No extra-axial fluid collections. Visualized paranasal sinuses and mastoid air cells clear. Bones unremarkable.  IMPRESSION: Atrophy with small vessel chronic ischemic changes of deep cerebral white matter. Old RIGHT basal ganglia lacunar infarct. No acute intracranial abnormalities. Electronically Signed   By: Lavonia Dana M.D.   On: 11/25/2014 16:30   US Soft Tissue Head/neck  11/24/2014  CLINICAL DATA:  Nodule noted on CT EXAM: THYROID ULTRASOUND TECHNIQUE: Ultrasound examination of the thyroid gland and adjacent soft tissues was performed. COMPARISON:  None. FINDINGS: Right thyroid lobe Measurements: 3.2 x 1.4 x 1.2 cm.  No nodules visualized. Left thyroid lobe Measurements: 5.6 x 3.9 x 3.6 cm. Large mass occupies most of the lobe and measures 4.4 x 4.4 x 3.5 cm. It is predominantly solid with some cystic elements. Isthmus Thickness: 4 mm.  No nodules visualized. Lymphadenopathy None visualized. IMPRESSION: Solitary mass in the left lobe measures 4.4 cm. Findings meet consensus criteria for biopsy. Ultrasound-guided fine needle aspiration should be considered, as per the consensus statement: Management of Thyroid Nodules Detected at [place]: Society of Radiologists in [place]. Radiology 2005; [id number]. Electronically Signed   By: Marybelle Killings M.D.   On: 11/24/2014 16:41   Dg Chest Port 1 View  11/25/2014  CLINICAL DATA:  Pt was to be discharged today but this afternoon she became hypotensive and bradycardic. EXAM: PORTABLE CHEST - 1 VIEW COMPARISON:  11/20/2014 FINDINGS: Stable cardiomegaly. Tortuous thoracic aorta. Relatively low lung volumes with perihilar bronchovascular crowding, no focal infiltrate or overt edema. Head overlies and obscures the lung apices. No effusion. Visualized skeletal structures are unremarkable. IMPRESSION: 1. Stable mild cardiomegaly.  No acute disease. Electronically Signed   By: Lucrezia Europe M.D.   On: 11/25/2014 18:24    EKG:   Orders placed or performed during the hospital encounter of 11/18/14  . ED EKG  . ED EKG    ASSESSMENT AND PLAN:   #1  hypotension: Unclear at this point what caused her episode of hypotension and nonresponsiveness yesterday. It resolved pretty much on its own with 500 cc normal saline bolus. She is currently comfortable. Overnight vitals have been stable.  #2 community-acquired pneumonia: Continuing on Rocephin and Zithromax. We'll discharge on Augmentin. Blood cultures negative.  #3 acute hypoxia with respiratory failure: Improved. Continue to wean oxygen. CT of the chest is negative for pulmonary emboli.  #4 reactive airway disease: Continue nebulizers as needed. Continue prednisone for now.  #5 history of CVA with residual left-sided hemiparesis: Continue Aggrenox and statin. She does have post stroke pain and this should be monitored and managed. Continue tramadol. We'll hold off on Percocet due to her episode yesterday.  #6 anxiety and depression: She has spoken with the chaplain. Continue Xanax and Celexa.  #7 thyroid nodule: Seen on ultrasound. May need FNA or biopsy. Check TSH and T4.  #8 leukocytosis: Blood cultures urine culture and UA are pending no fever. This may be acute phase reactant.  Transferred to the floor today. Continue telemetry. Anticipate discharge to skilled nursing tomorrow or day after.  All the records are reviewed and case discussed with Care Management/Social Workerr. Management plans discussed with the patient, family and they are in agreement.  CODE STATUS: Full  TOTAL TIME TAKING  CARE OF THIS PATIENT: 27minutes.  Greater than 50% of time spent in care coordination and counseling. POSSIBLE D/C IN 1 DAYS, DEPENDING ON CLINICAL CONDITION.   Myrtis Ser M.D on 11/26/2014 at 2:25 PM  Between 7am to 6pm - Pager - [phone]  After 6pm go to [email or web address] - password [email or web address] [facility]  Office  [phone]  CC: Primary care physician; [name], MD

## 2014-11-27 LAB — BASIC METABOLIC PANEL
ANION GAP: 5 (ref 5–15)
BUN: 33 mg/dL — AB (ref 6–20)
CHLORIDE: 91 mmol/L — AB (ref 101–111)
CO2: 32 mmol/L (ref 22–32)
Calcium: 10 mg/dL (ref 8.9–10.3)
Creatinine, Ser: 0.8 mg/dL (ref 0.44–1.00)
GFR calc Af Amer: >60 mL/min (ref >60)
GFR calc non Af Amer: >60 mL/min (ref >60)
GLUCOSE: 99 mg/dL (ref 65–99)
POTASSIUM: 3.8 mmol/L (ref 3.5–5.1)
Sodium: 128 mmol/L — ABNORMAL LOW (ref 135–145)

## 2014-11-27 LAB — CBC
HCT: 24.5 % — ABNORMAL LOW (ref 35.0–47.0)
Hemoglobin: 8.3 g/dL — ABNORMAL LOW (ref 12.0–16.0)
MCH: 31 pg (ref 26.0–34.0)
MCHC: 33.7 g/dL (ref 32.0–36.0)
MCV: 92 fL (ref 80.0–100.0)
Platelets: 234 10*3/uL (ref 150–440)
RBC: 2.67 MIL/uL — AB (ref 3.80–5.20)
RDW: 12.6 % (ref 11.5–14.5)
WBC: 16.5 10*3/uL — ABNORMAL HIGH (ref 3.6–11.0)

## 2014-11-27 LAB — BLOOD GAS, ARTERIAL
ACID-BASE EXCESS: 9.3 mmol/L — AB (ref 0.0–3.0)
Allens test (pass/fail): POSITIVE — AB
BICARBONATE: 32.5 meq/L — AB (ref 21.0–28.0)
FIO2: 0.28
O2 Saturation: 96.4 %
PCO2 ART: 38 mmHg (ref 32.0–48.0)
PH ART: 7.54 — AB (ref 7.350–7.450)
PO2 ART: 74 mmHg — AB (ref 83.0–108.0)
Patient temperature: 37

## 2014-11-27 LAB — GLUCOSE, CAPILLARY
GLUCOSE-CAPILLARY: 89 mg/dL (ref 65–99)
GLUCOSE-CAPILLARY: 162 mg/dL — AB (ref 65–99)
Glucose-Capillary: 124 mg/dL — ABNORMAL HIGH (ref 65–99)
Glucose-Capillary: 117 mg/dL — ABNORMAL HIGH (ref 65–99)

## 2014-11-27 MED ORDER — TIZANIDINE HCL 4 MG PO TABS
4.0000 mg | ORAL_TABLET | Freq: Four times a day (QID) | ORAL | Status: DC | PRN
  Administered 2014-11-27: 4 mg via ORAL
  Filled 2014-11-27: qty 1

## 2014-11-27 MED ORDER — POLYETHYLENE GLYCOL 3350 17 G PO PACK
17.0000 g | PACK | Freq: Two times a day (BID) | ORAL | Status: DC
  Administered 2014-11-27 – 2014-11-30 (×5): 17 g via ORAL
  Filled 2014-11-27 (×5): qty 1

## 2014-11-27 MED ORDER — SENNOSIDES-DOCUSATE SODIUM 8.6-50 MG PO TABS
2.0000 | ORAL_TABLET | Freq: Two times a day (BID) | ORAL | Status: DC
  Administered 2014-11-27 – 2014-11-30 (×6): 2 via ORAL
  Filled 2014-11-27 (×6): qty 2

## 2014-11-27 MED ORDER — SODIUM CHLORIDE 0.9 % IV BOLUS (SEPSIS)
500.0000 mL | Freq: Once | INTRAVENOUS | Status: AC
  Administered 2014-11-27: 500 mL via INTRAVENOUS

## 2014-11-27 MED ORDER — HYDRALAZINE HCL 20 MG/ML IJ SOLN
10.0000 mg | Freq: Four times a day (QID) | INTRAMUSCULAR | Status: DC | PRN
  Administered 2014-11-27: 10 mg via INTRAVENOUS
  Filled 2014-11-27: qty 1

## 2014-11-27 MED ORDER — PREDNISONE 20 MG PO TABS
20.0000 mg | ORAL_TABLET | Freq: Every day | ORAL | Status: DC
  Administered 2014-11-27 – 2014-11-28 (×2): 20 mg via ORAL
  Filled 2014-11-27 (×2): qty 1

## 2014-11-27 NOTE — Progress Notes (Signed)
Notified Dr. Volanda Napoleon that patient was unresponsive and would not wake up. BP low called a rapid response on patinet. Per Dr. Volanda Napoleon ordered 520ml normal saline bolus. Will call Dr. Volanda Napoleon back if patinet does not wake up after bolus.

## 2014-11-27 NOTE — Progress Notes (Signed)
Patient transferred over to ICU and report given.

## 2014-11-27 NOTE — Progress Notes (Signed)
Notified Dr. Volanda Napoleon that patient is still very lethargic and not able to take oral medications. Orders received to hold diet and medications until patient is more alert. Also let her know that patients blood pressure has been elevated. Order received for PRN hydralazine for SBP greater than 180. Will continue to assess and monitor.

## 2014-11-27 NOTE — Progress Notes (Signed)
[facility] [place] NAME: [name]    MR#:  [id number]  DATE OF BIRTH:  24-Aug-1940  SUBJECTIVE:  CHIEF COMPLAINT:   Chief Complaint  Patient presents with  . Cough   Had been doing much better this morning. Was back to baseline if not better than baseline. Unfortunately this afternoon has had a setback with becoming hypotensive and unresponsive   REVIEW OF SYSTEMS:   Review of Systems  Constitutional: Negative for fever.  Respiratory: Negative for shortness of breath.   Cardiovascular: Negative for chest pain and palpitations.  Gastrointestinal: Negative for nausea, vomiting and abdominal pain.  Genitourinary: Negative for dysuria.    DRUG ALLERGIES:   Allergies  Allergen Reactions  . Ace Inhibitors Cough  . Latex Hives  . Tetracycline Other (See Comments)    Reaction:  Unknown     VITALS:  Blood pressure 115/48, pulse 63, temperature 98.4 F (36.9 C), temperature source Oral, resp. rate 16, height 5\' 1"  (1.549 m), weight 78.5 kg (173 lb 1 oz), SpO2 100 %.  PHYSICAL EXAMINATION:  GENERAL:  74 y.o.-year-old patient lying in the bedpale, nonresponsive  EYES: Pupils equal, round, reactive to light and accommodation. No scleral icterus. Extraocular muscles intact.  HEENT: Head atraumatic, normocephalic. Oropharynx and nasopharynx clear.  NECK:  Supple, no jugular venous distention. No thyroid enlargement, no tenderness.  LUNGS: Normal breath sounds bilaterally, no wheezing, rales,rhonchi or crepitation. No use of accessory muscles of respiration.  CARDIOVASCULAR: S1, S2 normal. 3 out of 6 murmurs, rubs, or gallops.  ABDOMEN: Soft, nontender, nondistended. Bowel sounds present. No organomegaly or mass.  EXTREMITIES: No pedal edema, cyanosis, or clubbing.  NEUROLOGIC:Nonresponsive  PSYCHIATRIC:Nonresponsive  SKIN: No obvious rash, lesion, or ulcer.    LABORATORY PANEL:   CBC  Recent Labs Lab 11/27/14 0359   WBC 16.5*  HGB 8.3*  HCT 24.5*  PLT 234   ------------------------------------------------------------------------------------------------------------------  Chemistries   Recent Labs Lab 11/21/14 1310  11/25/14 1356  11/27/14 0359  NA  --   --  129*  < > 128*  K 3.3*  < > 3.6  < > 3.8  CL  --   --  91*  < > 91*  CO2  --   --  31  < > 32  GLUCOSE  --   --  162*  < > 99  BUN  --   --  35*  < > 33*  CREATININE  --   < > 1.09*  < > 0.80  CALCIUM  --   --  10.3  < > 10.0  MG 1.9  --   --   --   --   AST  --   --  24  --   --   ALT  --   --  25  --   --   ALKPHOS  --   --  31*  --   --   BILITOT  --   --  0.6  --   --   < > = values in this interval not displayed. ------------------------------------------------------------------------------------------------------------------  Cardiac Enzymes No results for input(s): TROPONINI in the last 168 hours. ------------------------------------------------------------------------------------------------------------------  RADIOLOGY:  Dg Chest 1 View  11/26/2014  CLINICAL DATA:  Dyspnea EXAM: CHEST 1 VIEW COMPARISON:  11/25/2014 FINDINGS: Elevated right hemidiaphragm unchanged. Lungs remain clear without evidence of pneumonia or heart failure. No effusion. IMPRESSION: No active disease. Electronically Signed   By: Franchot Gallo  M.D.   On: 11/26/2014 08:02   Ct Head Wo Contrast  11/25/2014  CLINICAL DATA:  Unresponsive, oxygen saturation 87% on 2 L, hypertension, history stroke EXAM: CT HEAD WITHOUT CONTRAST TECHNIQUE: Contiguous axial images were obtained from the base of the skull through the vertex without intravenous contrast. COMPARISON:  03/10/2014 FINDINGS: Generalized atrophy. Normal ventricular morphology. No midline shift or mass effect. Extensive small vessel chronic ischemic changes of deep cerebral white matter. Old RIGHT basal ganglia lacunar infarct. No intracranial hemorrhage, mass lesions, or evidence acute infarction.  No extra-axial fluid collections. Visualized paranasal sinuses and mastoid air cells clear. Bones unremarkable. IMPRESSION: Atrophy with small vessel chronic ischemic changes of deep cerebral white matter. Old RIGHT basal ganglia lacunar infarct. No acute intracranial abnormalities. Electronically Signed   By: Lavonia Dana M.D.   On: 11/25/2014 16:30   Dg Chest Port 1 View  11/25/2014  CLINICAL DATA:  Pt was to be discharged today but this afternoon she became hypotensive and bradycardic. EXAM: PORTABLE CHEST - 1 VIEW COMPARISON:  11/20/2014 FINDINGS: Stable cardiomegaly. Tortuous thoracic aorta. Relatively low lung volumes with perihilar bronchovascular crowding, no focal infiltrate or overt edema. Head overlies and obscures the lung apices. No effusion. Visualized skeletal structures are unremarkable. IMPRESSION: 1. Stable mild cardiomegaly.  No acute disease. Electronically Signed   By: Lucrezia Europe M.D.   On: 11/25/2014 18:24    EKG:   Orders placed or performed during the hospital encounter of 11/18/14  . ED EKG  . ED EKG    ASSESSMENT AND PLAN:   #1 hypotension: I suspect that this episode of nonresponsiveness and hypotension is due to Zanaflex which was restarted today. She will not receive any further sedating medications.   #2 community-acquired pneumonia:  improving. No leukocytosis fever or cough. Continuing on Rocephin and Zithromax. We'll discharge on Augmentin. Blood cultures negative.  #3 acute hypoxia with respiratory failure: Improved. Continue to wean oxygen. CT of the chest is negative for pulmonary emboli.  #4 reactive airway disease: Continue nebulizers as needed. Continue prednisone for now, dose decreased  #5 history of CVA with residual left-sided hemiparesis: Continue Aggrenox and statin. She does have post stroke pain and this should be monitored and managed. Continue tramadol. We'll hold off on Percocet and Zanaflex  #6 anxiety and depression: She has spoken with the  chaplain. Continue Xanax and Celexa.  #7 thyroid nodule: Seen on ultrasound. May need FNA or biopsy. Check TSH and T4.  #8 leukocytosis: Blood cultures urine culture and UA are pending no fever. This may be acute phase reactant.  Urgently transferred to ICU due to hypotension. Received fluid bolus and blood pressure is improving. Altered mental status likely due to Zanaflex.  All the records are reviewed and case discussed with Care Management/Social Workerr. Management plans discussed with the patient, family and they are in agreement.  CODE STATUS: Full  TOTAL TIME  critical care  TAKING CARE OF THIS PATIENT: 49minutes.  Greater than 50% of time spent in care coordination and counseling.Care plan discussed with the patient and then with her husband in the afternoon  POSSIBLE D/C IN 1 DAYS, DEPENDING ON CLINICAL CONDITION.   Myrtis Ser M.D on 11/27/2014 at 1:59 PM  Between 7am to 6pm - Pager - [phone]  After 6pm go to [email or web address] - password [email or web address] [facility]  Office  [phone]  CC: Primary care physician; [name], MD

## 2014-11-27 NOTE — Progress Notes (Signed)
OKay per Dr. Volanda Napoleon to restart Zanaflex 4mg  4 times daily prn

## 2014-11-27 NOTE — Progress Notes (Signed)
Notified Dr. Volanda Napoleon that patient was not becoming more alert following majority of 513ml bolus. Okay per Dr. Volanda Napoleon to place order to transfer patient to ICU. Bp was also still low.

## 2014-11-27 NOTE — Progress Notes (Signed)
   11/27/14 1400  Clinical Encounter Type  Visited With Patient;Health care provider  Visit Type Code  Referral From Nurse  Consult/Referral To Chaplain  Spiritual Encounters  Spiritual Needs Emotional  Stress Factors  Patient Stress Factors Not reviewed  Chaplain reported to a code and supported the patient and staff. Patient was moved to CCU and spoke with doctor and provided a compassionate presence. Chaplain Nabiha Planck A. Kerissa Coia Ext. [phone]

## 2014-11-28 ENCOUNTER — Inpatient Hospital Stay: Payer: PPO

## 2014-11-28 LAB — CBC
HEMATOCRIT: 24 % — AB (ref 35.0–47.0)
HEMOGLOBIN: 8.3 g/dL — AB (ref 12.0–16.0)
MCH: 31.3 pg (ref 26.0–34.0)
MCHC: 34.3 g/dL (ref 32.0–36.0)
MCV: 91.1 fL (ref 80.0–100.0)
Platelets: 281 10*3/uL (ref 150–440)
RBC: 2.64 MIL/uL — ABNORMAL LOW (ref 3.80–5.20)
RDW: 12.7 % (ref 11.5–14.5)
WBC: 18.2 10*3/uL — AB (ref 3.6–11.0)

## 2014-11-28 LAB — BASIC METABOLIC PANEL
ANION GAP: 8 (ref 5–15)
BUN: 33 mg/dL — ABNORMAL HIGH (ref 6–20)
CALCIUM: 10.8 mg/dL — AB (ref 8.9–10.3)
CHLORIDE: 93 mmol/L — AB (ref 101–111)
CO2: 29 mmol/L (ref 22–32)
Creatinine, Ser: 0.67 mg/dL (ref 0.44–1.00)
GFR calc non Af Amer: >60 mL/min (ref >60)
GLUCOSE: 110 mg/dL — AB (ref 65–99)
Potassium: 3.5 mmol/L (ref 3.5–5.1)
Sodium: 130 mmol/L — ABNORMAL LOW (ref 135–145)

## 2014-11-28 LAB — URINALYSIS COMPLETE WITH MICROSCOPIC (ARMC ONLY)
Bilirubin Urine: NEGATIVE
GLUCOSE, UA: NEGATIVE mg/dL
NITRITE: NEGATIVE
PH: 7 (ref 5.0–8.0)
Protein, ur: NEGATIVE mg/dL
SPECIFIC GRAVITY, URINE: 1.013 (ref 1.005–1.030)

## 2014-11-28 LAB — GLUCOSE, CAPILLARY
GLUCOSE-CAPILLARY: 125 mg/dL — AB (ref 65–99)
GLUCOSE-CAPILLARY: 117 mg/dL — AB (ref 65–99)
Glucose-Capillary: 119 mg/dL — ABNORMAL HIGH (ref 65–99)
Glucose-Capillary: 115 mg/dL — ABNORMAL HIGH (ref 65–99)

## 2014-11-28 MED ORDER — LIDOCAINE HCL (PF) 1 % IJ SOLN
INTRAMUSCULAR | Status: AC
  Filled 2014-11-28: qty 30

## 2014-11-28 MED ORDER — HEPARIN (PORCINE) IN NACL 2-0.9 UNIT/ML-% IJ SOLN
INTRAMUSCULAR | Status: AC
  Filled 2014-11-28: qty 1000

## 2014-11-28 MED ORDER — METOPROLOL TARTRATE 1 MG/ML IV SOLN
5.0000 mg | Freq: Once | INTRAVENOUS | Status: AC
  Administered 2014-11-28: 5 mg via INTRAVENOUS
  Filled 2014-11-28: qty 5

## 2014-11-28 MED ORDER — PREDNISONE 1 MG PO TABS
5.0000 mg | ORAL_TABLET | Freq: Every day | ORAL | Status: DC
  Administered 2014-11-29: 09:00:00 5 mg via ORAL
  Filled 2014-11-28: qty 5

## 2014-11-28 NOTE — Progress Notes (Signed)
[facility] Progress Note Patient Name: Maddyson Dossantos DOB: October 03, 1940 MRN: [id number]   Date of Service  11/28/2014  HPI/Events of Note  Pt unable to take pills due to mental status.  HR and BP up.   eICU Interventions  Will give 5 mg lopressor IV x one.     Intervention Category Major Interventions: Other:  Caela Huot 11/28/2014, 1:01 AM

## 2014-11-28 NOTE — Progress Notes (Signed)
PT Cancellation Note  Patient Details Name: [name] MRN: [id number] DOB: 01-02-1941   Cancelled Treatment:    Reason Eval/Treat Not Completed: Fatigue/lethargy limiting ability to participate (Treatment session/re-evaluation attempted this date.  Patient very lethargic with noted delay in processing, difficulty maintaining eyes open and following commands for meaningful participation with session.  Will re-attempt at later time/date as medically appropriate and able to participate.)   [name] H. Owens Shark, PT, DPT, NCS 11/28/2014, 2:32 PM [phone]

## 2014-11-28 NOTE — Progress Notes (Signed)
[name]    MR#:  [id number]  DATE OF BIRTH:  July 06, 1940  SUBJECTIVE:  CHIEF COMPLAINT:   Chief Complaint  Patient presents with  . Cough   Week. Alert and oriented. Would like to watch a religious chain on TV that is not there. Nurse tech IO has provided his own personal tablet for her to hear her pastor. She is very happy with this.  REVIEW OF SYSTEMS:   Review of Systems  Constitutional: Negative for fever.  Respiratory: Negative for shortness of breath.   Cardiovascular: Negative for chest pain and palpitations.  Gastrointestinal: Negative for nausea, vomiting and abdominal pain.  Genitourinary: Negative for dysuria.    DRUG ALLERGIES:   Allergies  Allergen Reactions  . Ace Inhibitors Cough  . Latex Hives  . Tetracycline Other (See Comments)    Reaction:  Unknown     VITALS:  Blood pressure 153/67, pulse 89, temperature 97.6 F (36.4 C), temperature source Oral, resp. rate 19, height 5\' 1"  (1.549 m), weight 76.658 kg (169 lb), SpO2 94 %.  PHYSICAL EXAMINATION:  GENERAL:  74 y.o.-year-old patient lying in the bed, pale, weak EYES: Pupils equal, round, reactive to light and accommodation. No scleral icterus. Extraocular muscles intact.  HEENT: Head atraumatic, normocephalic. Oropharynx and nasopharynx clear.  NECK:  Supple, no jugular venous distention. No thyroid enlargement, no tenderness.  LUNGS: Normal breath sounds bilaterally, no wheezing, rales,rhonchi or crepitation. No use of accessory muscles of respiration. Short shallow respirations CARDIOVASCULAR: S1, S2 normal. 3 out of 6 murmurs, rubs, or gallops.  ABDOMEN: Soft, there is a tender spot in the left lower quadrant which is hard and seems to correlate with a bruise, nondistended. Bowel sounds present. No organomegaly or mass.  EXTREMITIES: Trace pedal edema, cyanosis, or clubbing.  NEUROLOGIC alert, PSYCHIATRIC: Very  happy to hear her pastor, oriented 3 SKIN: Bruising over abdomen bilaterally   LABORATORY PANEL:   CBC  Recent Labs Lab 11/28/14 0240  WBC 18.2*  HGB 8.3*  HCT 24.0*  PLT 281   ------------------------------------------------------------------------------------------------------------------  Chemistries   Recent Labs Lab 11/25/14 1356  11/28/14 0240  NA 129*  < > 130*  K 3.6  < > 3.5  CL 91*  < > 93*  CO2 31  < > 29  GLUCOSE 162*  < > 110*  BUN 35*  < > 33*  CREATININE 1.09*  < > 0.67  CALCIUM 10.3  < > 10.8*  AST 24  --   --   ALT 25  --   --   ALKPHOS 31*  --   --   BILITOT 0.6  --   --   < > = values in this interval not displayed. ------------------------------------------------------------------------------------------------------------------  Cardiac Enzymes No results for input(s): TROPONINI in the last 168 hours. ------------------------------------------------------------------------------------------------------------------  RADIOLOGY:  No results found.  EKG:   Orders placed or performed during the hospital encounter of 11/18/14  . ED EKG  . ED EKG    ASSESSMENT AND PLAN:   #2 community-acquired pneumonia:  improving. No leukocytosis fever or cough. Continuing on Rocephin and Zithromax. We'll discharge on Augmentin. Blood cultures negative.  #3 acute hypoxia with respiratory failure: Improved. Continue to wean oxygen. CT of the chest is negative for pulmonary emboli.  #4 reactive airway disease: Continue nebulizers as needed. Continue prednisone for now, dose decreased  #5 history of CVA with residual left-sided hemiparesis: Continue Aggrenox and  statin. She does have post stroke pain and this should be monitored and managed. Continue tramadol. We'll hold off on Percocet and Zanaflex  #6 anxiety and depression: She has spoken with the chaplain. Continue Xanax and Celexa.  #7 thyroid nodule: Seen on ultrasound. May need FNA or biopsy. Check  TSH and T4.  #8 leukocytosis: Blood cultures urine culture and UA are pending no fever. This may be acute phase reactant.  #9 left lower quadrant tenderness and induration: We'll obtain imaging today. I suspect this is a hematoma  All the records are reviewed and case discussed with Care Management/Social Workerr. Management plans discussed with the patient, family and they are in agreement.  CODE STATUS: Full  TOTAL TIME  critical care  TAKING CARE OF THIS PATIENT: 17minutes.  Greater than 50% of time spent in care coordination and counseling care plan discussed with the patient and her husband. Hopefully discharge to skilled nursing tomorrow   POSSIBLE D/C IN 1 DAYS, DEPENDING ON CLINICAL CONDITION.   Myrtis Ser M.D on 11/28/2014 at 4:54 PM  Between 7am to 6pm - Pager - [phone]  After 6pm go to [email or web address] - password [email or web address] [facility]  Office  [phone]  CC: Primary care physician; [name], MD

## 2014-11-28 NOTE — Progress Notes (Addendum)
Nutrition Follow-up     INTERVENTION:  Meals and snacks: cater to pt preferences.  Recommend liberalizing diet to heart healthy vs heart healthy carb modified. No history of DM and eating < 50% of meals during admission Medical Nutrition Supplement Therapy: Will discontinue boost breeze per husband request and add back mightyshake chocolate BID    NUTRITION DIAGNOSIS:   Inadequate oral intake related to acute illness as evidenced by per patient/family report, meal completion < 25%.    GOAL:   Patient will meet greater than or equal to 90% of their needs    MONITOR:    (Energy Intake, Anthropometrics, Digestive system, Electrolyte and renal Profile)  REASON FOR ASSESSMENT:   Malnutrition Screening Tool    ASSESSMENT:   Pt admitted with b/l pna. Pt with h/o CVA with left sided hemiparesis.  Pt lethargic this pm, did wake few times during conversation with husband.     Current Nutrition: ate few bites of fruit, requesting grapes, few bites of mashed potatoes and green beans today for lunch.  Husband assisted pt with eating. Since admission mostly eating < 50% of meals per I and O sheet (day 9 of admission)   Scheduled Medications:  . amLODipine  5 mg Oral Daily  . amoxicillin-clavulanate  1 tablet Oral Q12H  . antiseptic oral rinse  7 mL Mouth Rinse BID  . calcium-vitamin D  2 tablet Oral BID  . carvedilol  6.25 mg Oral BID WC  . citalopram  20 mg Oral Daily  . dipyridamole-aspirin  1 capsule Oral BID  . enoxaparin (LOVENOX) injection  40 mg Subcutaneous QHS  . famotidine (PEPCID) IV  20 mg Intravenous Q12H  . gabapentin  300 mg Oral TID  . guaiFENesin  600 mg Oral BID  . hydrochlorothiazide  25 mg Oral Daily  . hydrocortisone cream   Topical BID  . insulin aspart  0-5 Units Subcutaneous QHS  . insulin aspart  0-9 Units Subcutaneous TID WC  . losartan  25 mg Oral BID  . oxybutynin  5 mg Oral BID  . pneumococcal 23 valent vaccine  0.5 mL Intramuscular  Tomorrow-1000  . polyethylene glycol  17 g Oral BID  . pravastatin  20 mg Oral q1800  . predniSONE  20 mg Oral Q breakfast  . senna-docusate  2 tablet Oral BID  . sodium chloride  3 mL Intravenous Q12H        Electrolyte/Renal Profile and Glucose Profile:   Recent Labs Lab 11/26/14 0526 11/27/14 0359 11/28/14 0240  NA 131* 128* 130*  K 3.6 3.8 3.5  CL 93* 91* 93*  CO2 30 32 29  BUN 38* 33* 33*  CREATININE 0.78 0.80 0.67  CALCIUM 9.9 10.0 10.8*  GLUCOSE 103* 99 110*   Protein Profile:  Recent Labs Lab 11/25/14 1356  ALBUMIN 3.2*    Gastrointestinal Profile: Last BM:11/21    Diet Order:  Diet heart healthy/carb modified Room service appropriate?: Yes; Fluid consistency:: Thin  Skin:   reviewed   Height:   Ht Readings from Last 1 Encounters:  11/27/14 5\' 1"  (1.549 m)    Weight:   Wt Readings from Last 1 Encounters:  11/28/14 169 lb (76.658 kg)    Ideal Body Weight:     BMI:  Body mass index is 31.95 kg/(m^2).  Estimated Nutritional Needs:   Kcal:  using IBW of 47.7kg, BEE: 914kcals, TEE: (IF 1.1-1.3)(AF 1.2) 1207-1426kcals  Protein:  38-48g protein (0.8-1.0g/kg)  Fluid:  1193-1453mL of fluid (25-6mL/kg)  EDUCATION NEEDS:   No education needs identified at this time  [place]. Zenia Resides, [place], [place] (pager)

## 2014-11-28 NOTE — Progress Notes (Addendum)
Pt transfered today from ICU. VSS. No signs of pain or discomfort. Urine specimen collected and sent to lab. Poor appetite.

## 2014-11-29 DIAGNOSIS — I739 Peripheral vascular disease, unspecified: Secondary | ICD-10-CM

## 2014-11-29 DIAGNOSIS — I672 Cerebral atherosclerosis: Secondary | ICD-10-CM

## 2014-11-29 DIAGNOSIS — D72829 Elevated white blood cell count, unspecified: Secondary | ICD-10-CM

## 2014-11-29 DIAGNOSIS — R490 Dysphonia: Secondary | ICD-10-CM

## 2014-11-29 DIAGNOSIS — Z794 Long term (current) use of insulin: Secondary | ICD-10-CM

## 2014-11-29 DIAGNOSIS — I779 Disorder of arteries and arterioles, unspecified: Secondary | ICD-10-CM

## 2014-11-29 DIAGNOSIS — E041 Nontoxic single thyroid nodule: Secondary | ICD-10-CM

## 2014-11-29 DIAGNOSIS — Z515 Encounter for palliative care: Secondary | ICD-10-CM

## 2014-11-29 DIAGNOSIS — Z79899 Other long term (current) drug therapy: Secondary | ICD-10-CM

## 2014-11-29 DIAGNOSIS — F419 Anxiety disorder, unspecified: Secondary | ICD-10-CM

## 2014-11-29 DIAGNOSIS — S3662XA Contusion of rectum, initial encounter: Secondary | ICD-10-CM

## 2014-11-29 DIAGNOSIS — I69354 Hemiplegia and hemiparesis following cerebral infarction affecting left non-dominant side: Secondary | ICD-10-CM

## 2014-11-29 DIAGNOSIS — J9601 Acute respiratory failure with hypoxia: Secondary | ICD-10-CM

## 2014-11-29 DIAGNOSIS — D500 Iron deficiency anemia secondary to blood loss (chronic): Secondary | ICD-10-CM

## 2014-11-29 DIAGNOSIS — F329 Major depressive disorder, single episode, unspecified: Secondary | ICD-10-CM

## 2014-11-29 LAB — COMPREHENSIVE METABOLIC PANEL
ALK PHOS: 34 U/L — AB (ref 38–126)
ALT: 27 U/L (ref 14–54)
ANION GAP: 7 (ref 5–15)
AST: 19 U/L (ref 15–41)
Albumin: 3 g/dL — ABNORMAL LOW (ref 3.5–5.0)
BUN: 26 mg/dL — ABNORMAL HIGH (ref 6–20)
CALCIUM: 10.2 mg/dL (ref 8.9–10.3)
CO2: 32 mmol/L (ref 22–32)
Chloride: 91 mmol/L — ABNORMAL LOW (ref 101–111)
Creatinine, Ser: 0.55 mg/dL (ref 0.44–1.00)
Glucose, Bld: 92 mg/dL (ref 65–99)
Potassium: 2.9 mmol/L — CL (ref 3.5–5.1)
SODIUM: 130 mmol/L — AB (ref 135–145)
Total Bilirubin: 0.9 mg/dL (ref 0.3–1.2)
Total Protein: 5.3 g/dL — ABNORMAL LOW (ref 6.5–8.1)

## 2014-11-29 LAB — IRON AND TIBC
Iron: 67 ug/dL (ref 28–170)
Saturation Ratios: 25 % (ref 10.4–31.8)
TIBC: 264 ug/dL (ref 250–450)
UIBC: 197 ug/dL

## 2014-11-29 LAB — RETICULOCYTES
RBC.: 2.48 MIL/uL — AB (ref 3.80–5.20)
RETIC COUNT ABSOLUTE: 151.3 10*3/uL (ref 19.0–183.0)
RETIC CT PCT: 6.1 % — AB (ref 0.4–3.1)

## 2014-11-29 LAB — CBC
HCT: 21.2 % — ABNORMAL LOW (ref 35.0–47.0)
Hemoglobin: 7.4 g/dL — ABNORMAL LOW (ref 12.0–16.0)
MCH: 31.9 pg (ref 26.0–34.0)
MCHC: 34.8 g/dL (ref 32.0–36.0)
MCV: 91.7 fL (ref 80.0–100.0)
PLATELETS: 233 10*3/uL (ref 150–440)
RBC: 2.32 MIL/uL — ABNORMAL LOW (ref 3.80–5.20)
RDW: 12.8 % (ref 11.5–14.5)
WBC: 12.4 10*3/uL — ABNORMAL HIGH (ref 3.6–11.0)

## 2014-11-29 LAB — GLUCOSE, CAPILLARY
GLUCOSE-CAPILLARY: 93 mg/dL (ref 65–99)
GLUCOSE-CAPILLARY: 125 mg/dL — AB (ref 65–99)
GLUCOSE-CAPILLARY: 98 mg/dL (ref 65–99)
Glucose-Capillary: 95 mg/dL (ref 65–99)

## 2014-11-29 LAB — BASIC METABOLIC PANEL
ANION GAP: 6 (ref 5–15)
BUN: 26 mg/dL — ABNORMAL HIGH (ref 6–20)
CALCIUM: 9.8 mg/dL (ref 8.9–10.3)
CO2: 29 mmol/L (ref 22–32)
CREATININE: 0.64 mg/dL (ref 0.44–1.00)
Chloride: 92 mmol/L — ABNORMAL LOW (ref 101–111)
Glucose, Bld: 108 mg/dL — ABNORMAL HIGH (ref 65–99)
Potassium: 4.1 mmol/L (ref 3.5–5.1)
SODIUM: 127 mmol/L — AB (ref 135–145)

## 2014-11-29 LAB — VITAMIN B12: VITAMIN B 12: 5319 pg/mL — AB (ref 180–914)

## 2014-11-29 LAB — MAGNESIUM: MAGNESIUM: 1.6 mg/dL — AB (ref 1.7–2.4)

## 2014-11-29 LAB — FOLATE: Folate: 20.1 ng/mL (ref >5.9)

## 2014-11-29 LAB — PREPARE RBC (CROSSMATCH)

## 2014-11-29 LAB — ABO/RH: ABO/RH(D): O POS

## 2014-11-29 LAB — FERRITIN: Ferritin: 88 ng/mL (ref 11–307)

## 2014-11-29 MED ORDER — MAGNESIUM SULFATE 2 GM/50ML IV SOLN
2.0000 g | Freq: Once | INTRAVENOUS | Status: AC
  Administered 2014-11-29: 2 g via INTRAVENOUS
  Filled 2014-11-29 (×2): qty 50

## 2014-11-29 MED ORDER — OXYCODONE HCL 5 MG PO TABS
5.0000 mg | ORAL_TABLET | Freq: Four times a day (QID) | ORAL | Status: DC | PRN
  Administered 2014-11-29 – 2014-11-30 (×3): 5 mg via ORAL
  Filled 2014-11-29 (×3): qty 1

## 2014-11-29 MED ORDER — POTASSIUM CHLORIDE 10 MEQ/100ML IV SOLN
10.0000 meq | INTRAVENOUS | Status: DC
  Administered 2014-11-29: 10 meq via INTRAVENOUS
  Filled 2014-11-29 (×4): qty 100

## 2014-11-29 MED ORDER — SODIUM CHLORIDE 0.9 % IV SOLN
Freq: Once | INTRAVENOUS | Status: AC
  Administered 2014-11-29: 15:00:00 via INTRAVENOUS

## 2014-11-29 MED ORDER — POTASSIUM CHLORIDE CRYS ER 20 MEQ PO TBCR
40.0000 meq | EXTENDED_RELEASE_TABLET | Freq: Two times a day (BID) | ORAL | Status: DC
  Administered 2014-11-29 – 2014-11-30 (×3): 40 meq via ORAL
  Filled 2014-11-29 (×3): qty 2

## 2014-11-29 MED ORDER — OXYCODONE HCL 5 MG PO TABS
5.0000 mg | ORAL_TABLET | ORAL | Status: DC | PRN
  Administered 2014-11-29 (×2): 5 mg via ORAL
  Filled 2014-11-29 (×2): qty 1

## 2014-11-29 MED ORDER — FAMOTIDINE 20 MG PO TABS
20.0000 mg | ORAL_TABLET | Freq: Two times a day (BID) | ORAL | Status: DC
  Administered 2014-11-29 – 2014-11-30 (×2): 20 mg via ORAL
  Filled 2014-11-29 (×2): qty 1

## 2014-11-29 NOTE — Consult Note (Signed)
Palliative Medicine Inpatient Consult Note   Name: [name] Date: 11/29/2014 MRN: [id number]  DOB: 1940/04/03  Referring Physician: Aldean Jewett, MD  Palliative Care consult requested for this 74 y.o. female for goals of medical therapy in patient with resolving pneumonia and a rectus sheath hematoma with acute blood loss anemia, among other problems including a prior CVA with left-sided hemiparesis.  Dr Volanda Napoleon has attempted on several occasions to bring up the subject of code status, but the patient has given vague responses, such as, "I choose life" without addressing the questions more directly.  I was invited to try to bring this up with the patient to see if she might understand better what was being asked. I was made aware that the patient and her family are quite religious.   I found that the patient's husband was present and thought (at first) that this was fortuitous.    TODAY'S DISCUSSION: I entered the room and waited a while for nurse to finish assisting pt with her food order.  I asked permission to visit and talk and was warmly welcomed.   I took a seat.  I introduced myself.  I mentioned that I am a physician but I am also one who comes and talks to patients about any wishes they might have that are important to talk about before the need to talk about them comes to pass.  I said a few more sentences (and in those I mentioned 'code status') -- and though the patient was smiling and nodding and indicating she had some understanding of what I was saying, her husband quickly got quite adamant and forcefully vocal and went on and on making strong/ angry statements quite out of the blue --given that we had all been so cordial with each other.    He said things like, " We don't believe in talking about none of that stuff because we are [place]";   "Even if you are Christian, it doesn't mean you believe what we believe."; and  "We don't listen to any government stuff" (I had not  said a thing about government stuff).  He went into his beliefs that his wife is very intelligent and that Jesus is going to heal her completely and she will be just as intelligent as she was before.  He really did not let me get a word in for a long time so that I could politely reassure him that I was not going to talk to them further about these issues and that I did not mean to get him upset. He just kept talking somewhat angrily and loudly about this. I knew it was not time to continue talking about any of the wishes/MOST form matters given his hostility.   Eventually, as I was standing to leave (unable to talk since he hadn't paused with his adamant / angry statements),  I was finally able to say that I respected everything he had to say and that I did not have any intention of trying to get him or his wife to change their views. I told him that "now we can say we talked about this at least --and we know that your wishes are that we not talk about any of this ---and that is OK".   Before I left, I held the patient's hand and she pulled me into a hug and whispered, "thank you" to me --very sweetly.    He reiterated that 'we don't ever want to talk about any  of this, because Jesus is healing her and he is in charge, not you, not the government, not humans', etc.  He continued to talk to his wife saying something about government stuff after I left the room.  I could hear him a bit out in the hall as I left.      At this point, I think we can understand why she has been resistant to discuss any wishes or Most form choices, etc.  I think that her husband could become very, very angry if we bring this up again --though this will undoubtedly happen with future encounters with the patient. She might possibly give different answers if approached alone, but she has not done so to date when Dr. Volanda Napoleon has attempted to broach these important matters, so this is not likely to happen even in that setting.   It  would be great if we had a portable form that said, "Husband has asked that we never ask his wife about code status or any advanced care issues'.  But we don't have a form for that.   Will sign off.  Pt to remain FULL CODE due to unwillingness to discuss the matter.  ------------------------------------------- IMPRESSION: Pneumonia Acute blood loss anemia Rectus sheath hematoma Anxiety and Depression H/O CVA with left paresis (watershed strokes) Prior lacunar infarct and TIA Essential HTN Hypotension related to Zanaflex use as prescribed (adverse effect of med used as Rxd) Small Vessel disease Carotid Artery and Vertebral Artery Disease (per imaging 2010) Cerebrovascular Arteriosclerosis (per prior imaging) Thyroid Nodule (imaging : Solitary mass in the left lobe measures 4.4 cm. Findings meet consensus criteria for biopsy.  Dysphonia (unclear why?? Thyroid nodule related ? Prior CVA?--did not learn why she has to whisper and has no voice) Leukocytosis Acute hypoxic respiratory failure --improved Fatigue and weakness Hyponatremia   REVIEW OF SYSTEMS:  Patient is not able to provide ROS --husband spoke for her  [place]: Yes  -husband.  SOCIAL HISTORY:  reports that she has never smoked. She does not have any smokeless tobacco history on file. She reports that she does not drink alcohol or use illicit drugs.  LEGAL DOCUMENTS:  none  CODE STATUS: Full code  PAST MEDICAL HISTORY: Past Medical History  Diagnosis Date  . Stroke ([place])   . Hypertension   . TIA (transient ischemic attack)     PAST SURGICAL HISTORY:  Past Surgical History  Procedure Laterality Date  . Abdominal hysterectomy    . Appendectomy      ALLERGIES:  is allergic to ace inhibitors; latex; and tetracycline.  MEDICATIONS:  Current Facility-Administered Medications  Medication Dose Route Frequency Provider Last Rate Last Dose  . 0.9 %  sodium chloride infusion  250 mL Intravenous PRN  Saundra Shelling, MD      . acetaminophen (TYLENOL) tablet 500 mg  500 mg Oral Q6H PRN Saundra Shelling, MD   500 mg at 11/28/14 0259  . amLODipine (NORVASC) tablet 5 mg  5 mg Oral Daily Epifanio Lesches, MD   5 mg at 11/29/14 0609  . antiseptic oral rinse (CPC / CETYLPYRIDINIUM CHLORIDE 0.05%) solution 7 mL  7 mL Mouth Rinse BID Saundra Shelling, MD   7 mL at 11/29/14 0904  . calcium-vitamin D (OSCAL WITH D) 500-200 MG-UNIT per tablet 2 tablet  2 tablet Oral BID Saundra Shelling, MD   2 tablet at 11/29/14 0904  . carvedilol (COREG) tablet 6.25 mg  6.25 mg Oral BID WC Laverle Hobby, MD  6.25 mg at 11/29/14 1617  . citalopram (CELEXA) tablet 20 mg  20 mg Oral Daily Saundra Shelling, MD   20 mg at 11/29/14 0904  . dipyridamole-aspirin (AGGRENOX) 200-25 MG per 12 hr capsule 1 capsule  1 capsule Oral BID Saundra Shelling, MD   1 capsule at 11/29/14 1132  . famotidine (PEPCID) tablet 20 mg  20 mg Oral BID Aldean Jewett, MD      . gabapentin (NEURONTIN) capsule 300 mg  300 mg Oral TID Saundra Shelling, MD   300 mg at 11/29/14 1617  . guaiFENesin (MUCINEX) 12 hr tablet 600 mg  600 mg Oral BID Saundra Shelling, MD   600 mg at 11/29/14 0905  . hydrALAZINE (APRESOLINE) injection 10 mg  10 mg Intravenous Q6H PRN Aldean Jewett, MD   10 mg at 11/27/14 2319  . hydrochlorothiazide (HYDRODIURIL) tablet 25 mg  25 mg Oral Daily Saundra Shelling, MD   25 mg at 11/29/14 0609  . hydrocortisone cream 1 %   Topical BID Lytle Butte, MD      . insulin aspart (novoLOG) injection 0-5 Units  0-5 Units Subcutaneous QHS Lytle Butte, MD   0 Units at 11/26/14 2250  . insulin aspart (novoLOG) injection 0-9 Units  0-9 Units Subcutaneous TID WC Lytle Butte, MD   1 Units at 11/29/14 1148  . ipratropium-albuterol (DUONEB) 0.5-2.5 (3) MG/3ML nebulizer solution 3 mL  3 mL Nebulization Q6H PRN Aldean Jewett, MD      . losartan (COZAAR) tablet 25 mg  25 mg Oral BID Epifanio Lesches, MD   25 mg at 11/29/14 0609  . magnesium sulfate  IVPB 2 g 50 mL  2 g Intravenous Once Aldean Jewett, MD      . oxybutynin [facility]) tablet 5 mg  5 mg Oral BID Saundra Shelling, MD   5 mg at 11/29/14 0904  . oxyCODONE (Oxy IR/ROXICODONE) immediate release tablet 5 mg  5 mg Oral Q6H PRN Aldean Jewett, MD   5 mg at 11/29/14 1617  . pneumococcal 23 valent vaccine (PNU-IMMUNE) injection 0.5 mL  0.5 mL Intramuscular Tomorrow-1000 Pavan Pyreddy, MD   0.5 mL at 11/20/14 1000  . polyethylene glycol (MIRALAX / GLYCOLAX) packet 17 g  17 g Oral BID Aldean Jewett, MD   17 g at 11/29/14 0904  . potassium chloride SA (K-DUR,KLOR-CON) CR tablet 40 mEq  40 mEq Oral BID Harrie Foreman, MD   40 mEq at 11/29/14 0904  . pravastatin (PRAVACHOL) tablet 20 mg  20 mg Oral q1800 Saundra Shelling, MD   20 mg at 11/28/14 1730  . senna-docusate (Senokot-S) tablet 2 tablet  2 tablet Oral BID Aldean Jewett, MD   2 tablet at 11/29/14 [phone]  . sodium chloride 0.9 % injection 3 mL  3 mL Intravenous Q12H Pavan Pyreddy, MD   3 mL at 11/29/14 1041  . sodium chloride 0.9 % injection 3 mL  3 mL Intravenous PRN Saundra Shelling, MD   3 mL at 11/19/14 1920    Vital Signs: BP 140/63 mmHg  Pulse 81  Temp(Src) 98.6 F (37 C) (Oral)  Resp 18  Ht 5\' 1"  (1.549 m)  Wt 78.699 kg (173 lb 8 oz)  BMI 32.80 kg/m2  SpO2 95% Filed Weights   11/28/14 0400 11/28/14 1052 11/29/14 0500  Weight: 79.5 kg (175 lb 4.3 oz) 76.658 kg (169 lb) 78.699 kg (173 lb 8 oz)    Estimated body mass index is  32.8 kg/(m^2) as calculated from the following:   Height as of this encounter: 5\' 1"  (1.549 m).   Weight as of this encounter: 78.699 kg (173 lb 8 oz).  PERFORMANCE STATUS (ECOG) : 4 - Bedbound  PHYSICAL EXAM: Sitting partly upright in medical bed, ordering her dinner in a whispered voice to nurse who is on the phone with the kitchen. Her husband is bedside. EOMI OP cler Neck no JVD --aware of thyroid nodule Heart rrr no mgr Lungs cta ant-upper Skin pale warm and dry  Note that at the  end of the visit, pt grabbed my hand and pulled me into a hug and said 'thank you' --though I had been pretty much dismissed by her husband who made it clear they do not believe in talking about any of 'this government stuff'.    LABS: CBC:    Component Value Date/Time   WBC 12.4* 11/29/2014 0438   WBC 8.5 03/29/2013 0544   HGB 7.4* 11/29/2014 0438   HGB 13.5 03/29/2013 0544   HCT 21.2* 11/29/2014 0438   HCT 39.0 03/29/2013 0544   PLT 233 11/29/2014 0438   PLT 227 03/29/2013 0544   MCV 91.7 11/29/2014 0438   MCV 94 03/29/2013 0544   NEUTROABS 12.0* 11/25/2014 1356   NEUTROABS 5.0 03/29/2013 0544   LYMPHSABS 2.9 11/25/2014 1356   LYMPHSABS 2.5 03/29/2013 0544   MONOABS 1.1* 11/25/2014 1356   MONOABS 0.9 03/29/2013 0544   EOSABS 0.0 11/25/2014 1356   EOSABS 0.1 03/29/2013 0544   BASOSABS 0.0 11/25/2014 1356   BASOSABS 0.0 03/29/2013 0544   Comprehensive Metabolic Panel:    Component Value Date/Time   NA 127* 11/29/2014 1403   NA 137 04/06/2013 0652   K 4.1 11/29/2014 1403   K 4.6 04/06/2013 0652   CL 92* 11/29/2014 1403   CL 103 04/06/2013 0652   CO2 29 11/29/2014 1403   CO2 32 04/06/2013 0652   BUN 26* 11/29/2014 1403   BUN 11 04/06/2013 0652   CREATININE 0.64 11/29/2014 1403   CREATININE 0.88 04/06/2013 0652   GLUCOSE 108* 11/29/2014 1403   GLUCOSE 74 04/06/2013 0652   CALCIUM 9.8 11/29/2014 1403   CALCIUM 9.4 04/06/2013 0652   AST 19 11/29/2014 0438   AST 27 03/25/2013 1347   ALT 27 11/29/2014 0438   ALT 24 03/25/2013 1347   ALKPHOS 34* 11/29/2014 0438   ALKPHOS 66 03/25/2013 1347   BILITOT 0.9 11/29/2014 0438   BILITOT 0.5 03/25/2013 1347   PROT 5.3* 11/29/2014 0438   PROT 7.8 03/25/2013 1347   ALBUMIN 3.0* 11/29/2014 0438   ALBUMIN 4.3 03/25/2013 1347    Time Spent:  55 minutes

## 2014-11-29 NOTE — Progress Notes (Signed)
Dr. Marcille Blanco notified pt changed from NSR 80's to Afib in 120-130's. Telephone order to give AM doses of PO BP medications and hold PRN BP medications at this time. Will continue to assess.

## 2014-11-29 NOTE — Plan of Care (Signed)
Problem: Safety: Goal: Ability to remain free from injury will improve Outcome: Progressing Patient alert and oriented x 3 today, husband at bedside most of day, vss, Patient Hg down to 7.4 this am getting total of 2 units of blood  first unit transfusing now without any issues,  VSS, having generalized soreness and pain PRNs help with this, K down this am as well replaced in IV and po now back up WNL

## 2014-11-29 NOTE — Care Management Important Message (Signed)
Important Message  Patient Details  Name: [name] MRN: [id number] Date of Birth: [birth year]   Medicare Important Message Given:  Yes    [name] 11/29/2014, 1:02 PM

## 2014-11-29 NOTE — Progress Notes (Addendum)
[place] [facility] [place] NAME: [name]    MR#:  [id number]  DATE OF BIRTH:  Aug 02, 1940  SUBJECTIVE:  CHIEF COMPLAINT:   Chief Complaint  Patient presents with  . Cough   More alert today. Complains of pain in the abdomen.  REVIEW OF SYSTEMS:   Review of Systems  Constitutional: Negative for fever.  Respiratory: Negative for shortness of breath.   Cardiovascular: Negative for chest pain and palpitations.  Gastrointestinal: Negative for nausea, vomiting and abdominal pain.  Genitourinary: Negative for dysuria.    DRUG ALLERGIES:   Allergies  Allergen Reactions  . Ace Inhibitors Cough  . Latex Hives  . Tetracycline Other (See Comments)    Reaction:  Unknown     VITALS:  Blood pressure 130/55, pulse 78, temperature 97.8 F (36.6 C), temperature source Oral, resp. rate 18, height 5\' 1"  (1.549 m), weight 78.699 kg (173 lb 8 oz), SpO2 96 %.  PHYSICAL EXAMINATION:  GENERAL:  74 y.o.-year-old patient lying in the bed, no acute distress EYES: Pupils equal, round, reactive to light and accommodation. No scleral icterus. Extraocular muscles intact.  HEENT: Head atraumatic, normocephalic. Oropharynx and nasopharynx clear.  NECK:  Supple, no jugular venous distention. No thyroid enlargement, no tenderness.  LUNGS: Normal breath sounds bilaterally, no wheezing, rales,rhonchi or crepitation. No use of accessory muscles of respiration. Short shallow respirations CARDIOVASCULAR: S1, S2 normal. 3 out of 6 SEM murmurs, rubs, or gallops.  ABDOMEN: Soft, there is a tender spot in the left lower quadrant which is hard significant bruising over the lower abdomen and pelvic Mons, nondistended. Bowel sounds present. No organomegaly or mass.  EXTREMITIES: Trace pedal edema bilaterally, no cyanosis, or clubbing.  NEUROLOGIC alert, oriented left-sided hemiparesis PSYCHIATRIC: oriented 3, slightly anxious SKIN: Bruising over abdomen  bilaterally   LABORATORY PANEL:   CBC  Recent Labs Lab 11/29/14 0438  WBC 12.4*  HGB 7.4*  HCT 21.2*  PLT 233   ------------------------------------------------------------------------------------------------------------------  Chemistries   Recent Labs Lab 11/29/14 0438 11/29/14 0830 11/29/14 1403  NA 130*  --  127*  K 2.9*  --  4.1  CL 91*  --  92*  CO2 32  --  29  GLUCOSE 92  --  108*  BUN 26*  --  26*  CREATININE 0.55  --  0.64  CALCIUM 10.2  --  9.8  MG  --  1.6*  --   AST 19  --   --   ALT 27  --   --   ALKPHOS 34*  --   --   BILITOT 0.9  --   --    ------------------------------------------------------------------------------------------------------------------  Cardiac Enzymes No results for input(s): TROPONINI in the last 168 hours. ------------------------------------------------------------------------------------------------------------------  RADIOLOGY:  Ct Abdomen Pelvis Wo Contrast  11/28/2014  CLINICAL DATA:  74 year old female with left lower quadrant pain EXAM: CT ABDOMEN AND PELVIS WITHOUT CONTRAST TECHNIQUE: Multidetector CT imaging of the abdomen and pelvis was performed following the standard protocol without IV contrast. COMPARISON:  None. FINDINGS: Lower Chest: Dependent atelectasis in both lower lobes. Mild lower lobe bronchial wall thickening. Borderline cardiomegaly. Small volume pericardial effusion. The intracardiac blood pool is hypodense relative to the adjacent myocardium consistent with anemia. Moderately large hiatal hernia. Abdomen: Unenhanced CT was performed per clinician order. Lack of IV contrast limits sensitivity and specificity, especially for evaluation of abdominal/pelvic solid viscera. Within these limitations, unremarkable CT appearance of the intra-abdominal portion of the stomach, the duodenum,  spleen, left adrenal glands and pancreas. 2.1 cm low-attenuation left adrenal lesion consistent with a benign adenoma. Normal  hepatic contour and morphology. Geographic hypoattenuation in the lateral aspect of the lateral segment of the left hepatic lobe is most consistent with focal fatty infiltration. Stones containing gas present within the gallbladder lumen. No evidence of intra or extrahepatic biliary ductal dilatation. Unremarkable appearance of the bilateral kidneys. No focal solid lesion, hydronephrosis or nephrolithiasis. Moderate volume of formed stool in the rectum. The remainder the colon is relatively decompressed. No focal bowel wall thickening or evidence of obstruction. No free fluid or suspicious adenopathy. Pelvis: Surgical changes of prior hysterectomy. Unremarkable bladder. Mild pelvic floor laxity. Bones/Soft Tissues: Heterogeneous high attenuation expands the left rectus abdominis musculature consistent with a rectus sheath hematoma. The size of the hematoma is approximately 6.7 x 7.2 x 9.5 cm. No acute fracture or aggressive appearing lytic or blastic osseous lesion. Vascular: Scattered atherosclerotic vascular calcifications. No aneurysmal dilatation. IMPRESSION: 1. Acute left rectus sheath hematoma measuring 6.7 x 7.2 x 9.5 cm. 2. Dependent atelectasis in both lower lobes. 3. The intracardiac blood pool is hypodense relative to the adjacent myocardium consistent with anemia. 4. Moderately large hiatal hernia. 5. Benign 2.1 cm left adrenal adenoma. 6. Cholelithiasis. 7. Moderate volume of formed stool in the rectum suggests an element of underlying constipation. 8. Scattered atherosclerotic vascular calcifications. These results will be called to the ordering clinician or representative by the Radiologist Assistant, and communication documented in the PACS or zVision Dashboard. Electronically Signed   By: Jacqulynn Cadet M.D.   On: 11/28/2014 19:57    EKG:   Orders placed or performed during the hospital encounter of 11/18/14  . ED EKG  . ED EKG    ASSESSMENT AND PLAN:   #1 community-acquired  pneumonia: This was her original diagnosis. Now resolved.Blood cultures negative. Has completed course of treatment. We'll discontinue antibiotics  #2 acute hypoxia with respiratory failure due to pneumonia: Resolved Continue to wean oxygen. Nebulizers as needed. CT of the chest is negative for pulmonary emboli. 2-D echocardiogram 11/13 shows ejection fraction [id number], no diastolic dysfunction is noted.  #3 acute blood loss anemia: Due to rectus sheath hematoma. Have stopped Lovenox. Transfusing 2 units of packed red blood cells today 11/22. Belly binder to decrease additional accumulation of blood.  #4 history of CVA with residual left-sided hemiparesis: Stable no changes. Continue Aggrenox and statin. She does have post stroke pain. Continue tramadol and oxycodone as needed as well as Neurontin.. Hold Zanaflex as she has become acutely hypotensive and nonresponsive each time we have given that medication.  #5 anxiety and depression: She has spoken with the chaplain. Continue Xanax and Celexa.  #6 thyroid nodule: Seen on ultrasound. May need FNA or biopsy after discharge.  TSH and T4 are normal.  #7 hyperglycemia: Due to steroids which were used towards the beginning of this admission to treat reactive airway. That sugars well controlled with sliding scale insulin. Prednisone discontinued and I suspect blood sugars will be normal going forward  #8 hypertension: Blood pressure has been well controlled on current regimen of amlodipine 5 mg, carvedilol 6.25 twice a day, hydrochlorothiazide 25, losartan 25 twice a day.  #9 disposition: Plan is to discharge to skilled nursing facility hopefully tomorrow.  #10 hypokalemia: Replace. Replace magnesium.  All the records are reviewed and case discussed with Care Management/Social Workerr. Management plans discussed with the patient, family and they are in agreement.  CODE STATUS: Full  TOTAL TIME  critical care  TAKING CARE OF THIS PATIENT: 78minutes.   Greater than 50% of time spent in care coordination and counseling care plan discussed with the patient and her husband. Hopefully discharge to skilled nursing tomorrow   POSSIBLE D/C IN 1 DAYS, DEPENDING ON CLINICAL CONDITION.   Myrtis Ser M.D on 11/29/2014 at 2:46 PM  Between 7am to 6pm - Pager - [phone]  After 6pm go to [email or web address] - password [email or web address] [facility]  Office  [phone]  CC: Primary care physician; [name], MD

## 2014-11-29 NOTE — Clinical Social Work Note (Signed)
CSW spoke with [facility] for insurance auth. Per Amy, pt will have auth at time of discharge. CSW also updated admissions coordinator at [facility]. Facility will also be able to accept pt at discharge. CSW will continue to follow.   Darden Dates, MSW,LCSW Clinical Social Worker  [phone]

## 2014-11-29 NOTE — Progress Notes (Signed)
PT Cancellation Note  Patient Details Name: [name] MRN: [id number] DOB: 04-07-1940   Cancelled Treatment:    Reason Eval/Treat Not Completed: Medical issues which prohibited therapy (Treatment session re-attempted.  RN currently preparing patient to receive blood transfusion.  Will hold at this time and re-attempt next date as medically appropriate.)  Reha Martinovich H. Owens Shark, PT, DPT, NCS 11/29/2014, 2:17 PM [phone]

## 2014-11-29 NOTE — Plan of Care (Addendum)
C/o generalized pain relived by PRN Percocet. Noted healing bruises on lower abdomen, pelvic area and back. Pt unable to explain the cause of the bruising.   CT Abdomen Pelvis impression:  1. Acute left rectus sheath hematoma measuring 6.7 x 7.2 x 9.5 cm. 2. Dependent atelectasis in both lower lobes. 3. The intracardiac blood pool is hypodense relative to the adjacent myocardium consistent with anemia. 4. Moderately large hiatal hernia. 5. Benign 2.1 cm left adrenal adenoma. 6. Cholelithiasis. 7. Moderate volume of formed stool in the rectum suggests an element of underlying constipation. 8. Scattered atherosclerotic vascular calcifications.  Problem: Physical Regulation: Goal: Ability to maintain a body temperature in the normal range will improve Outcome: Progressing VSS, afebrile. IV ABx given as scheduled. Lungs clear/diminished.  Problem: Respiratory: Goal: Respiratory status will improve Outcome: Progressing O2 sats stable on room air. Cough/deep breathing encouraged.

## 2014-11-29 NOTE — Progress Notes (Signed)
Second unit of blood started at 1757 by Vilinda Boehringer RN. Did not scan out, so I, Weldon RN & Drucilla Glennon Mac RN, scanned second unit of blood out to complete the first unit and continue the second unit of blood.

## 2014-11-30 DIAGNOSIS — J189 Pneumonia, unspecified organism: Secondary | ICD-10-CM | POA: Diagnosis not present

## 2014-11-30 LAB — BASIC METABOLIC PANEL
ANION GAP: 7 (ref 5–15)
BUN: 24 mg/dL — AB (ref 6–20)
CO2: 27 mmol/L (ref 22–32)
Calcium: 9.9 mg/dL (ref 8.9–10.3)
Chloride: 96 mmol/L — ABNORMAL LOW (ref 101–111)
Creatinine, Ser: 0.54 mg/dL (ref 0.44–1.00)
Glucose, Bld: 86 mg/dL (ref 65–99)
POTASSIUM: 4.3 mmol/L (ref 3.5–5.1)
SODIUM: 130 mmol/L — AB (ref 135–145)

## 2014-11-30 LAB — TYPE AND SCREEN
ABO/RH(D): O POS
ANTIBODY SCREEN: NEGATIVE
UNIT DIVISION: 0
Unit division: 0

## 2014-11-30 LAB — GLUCOSE, CAPILLARY
GLUCOSE-CAPILLARY: 94 mg/dL (ref 65–99)
Glucose-Capillary: 101 mg/dL — ABNORMAL HIGH (ref 65–99)

## 2014-11-30 LAB — URINE CULTURE: Special Requests: NORMAL

## 2014-11-30 LAB — CBC
HCT: 33.6 % — ABNORMAL LOW (ref 35.0–47.0)
Hemoglobin: 11.3 g/dL — ABNORMAL LOW (ref 12.0–16.0)
MCH: 29 pg (ref 26.0–34.0)
MCHC: 33.6 g/dL (ref 32.0–36.0)
MCV: 86.2 fL (ref 80.0–100.0)
PLATELETS: 253 10*3/uL (ref 150–440)
RBC: 3.9 MIL/uL (ref 3.80–5.20)
RDW: 17.6 % — ABNORMAL HIGH (ref 11.5–14.5)
WBC: 14.5 10*3/uL — AB (ref 3.6–11.0)

## 2014-11-30 LAB — CULTURE, BLOOD (ROUTINE X 2)
Culture: NO GROWTH
Culture: NO GROWTH

## 2014-11-30 MED ORDER — HYDROCORTISONE 1 % EX CREA: TOPICAL_CREAM | Freq: Two times a day (BID) | CUTANEOUS | Status: DC

## 2014-11-30 MED ORDER — SENNOSIDES-DOCUSATE SODIUM 8.6-50 MG PO TABS: 2.0000 | ORAL_TABLET | Freq: Two times a day (BID) | ORAL | Status: DC

## 2014-11-30 MED ORDER — ALPRAZOLAM 0.5 MG PO TABS: 0.5000 mg | ORAL_TABLET | Freq: Three times a day (TID) | ORAL | Status: DC | PRN

## 2014-11-30 MED ORDER — OXYCODONE HCL 5 MG PO TABS: 5.0000 mg | ORAL_TABLET | Freq: Four times a day (QID) | ORAL | Status: DC | PRN

## 2014-11-30 MED ORDER — OXYCODONE-ACETAMINOPHEN 5-325 MG PO TABS: 2.0000 | ORAL_TABLET | Freq: Four times a day (QID) | ORAL | Status: DC | PRN

## 2014-11-30 MED ORDER — TRAMADOL HCL 50 MG PO TABS: 50.0000 mg | ORAL_TABLET | Freq: Three times a day (TID) | ORAL | Status: DC | PRN

## 2014-11-30 MED ORDER — FAMOTIDINE 20 MG PO TABS: 20.0000 mg | ORAL_TABLET | Freq: Two times a day (BID) | ORAL | Status: DC

## 2014-11-30 NOTE — Evaluation (Signed)
Physical Therapy Re-Evaluation Patient Details Name: [name] MRN: [id number] DOB: 04-25-1940 Today's Date: 11/30/2014   History of Present Illness  presented to ER secondary to cough, SOB; admitted with bilat community-acquired PNA.  Of note, CT completed on previous date negative for PE.  Since initial evaluation, patient with two separate episodes of unresponsiveness/hypotension requiring transfer to CCU for additional monitoring (likely related to meds, xanaflex?, per notes).  Orders received for continued PT at this time.  Clinical Impression  Upon evaluation, patient alert and oriented to basic information; marked STM deficits (unable to recall therapist from previous encounter).  Strength and ROM generally unchanged from initial evaluation; additional mobility deferred at this time, as patient refusing OOB attempts.  Continues with significant hemiparesis from previous CVA in L hemi-body with noted extensor tone/associated contracture.  Patient previously max/total assist +2 on initial evaluation; anticipate no less than that at this time.  Will continue mobility progression/assessment in subsequent sessions as patient allows. Would benefit from skilled PT to address above deficits and promote optimal return to PLOF; recommend transition to STR upon discharge from acute hospitalization.     Follow Up Recommendations SNF    Equipment Recommendations       Recommendations for Other Services       Precautions / Restrictions Precautions Precautions: Fall Restrictions Weight Bearing Restrictions: No      Mobility  Bed Mobility               General bed mobility comments: patient refused this date; max/dep +2 on initial evaluation 1 week prior  Transfers                 General transfer comment: patient refused this date; total assist +2 on initial evaluation 1 week prior (bilat shoes and L AFO-that does not fit properly-donned)  Ambulation/Gait              General Gait Details: refused attempts at [place] activities this date  Stairs            Wheelchair Mobility    Modified Rankin (Stroke Patients Only)       Balance                                             Pertinent Vitals/Pain Pain Assessment: No/denies pain (soreness in chest "from pneumonia")    Home Living Family/patient expects to be discharged to:: Private residence Living Arrangements: Spouse/significant other Available Help at Discharge: Family Type of Home: House Home Access: Stairs to enter Entrance Stairs-Rails: None Technical brewer of Steps: 1+1 Home Layout: One level Home Equipment:  (hemi-walker, lift chair)      Prior Function Level of Independence: Needs assistance         Comments: Assist from husband for ADLs, bed mobility and transfers as needed; very limited household ambulator at baseline     Hand Dominance   Dominant Hand: Right    Extremity/Trunk Assessment   Upper Extremity Assessment:  (R UE grossly 3+/5 throughout; L LE generally flaccid with marked extensor tone and significant contracture at all joints (baseline).  Maintains L hand in fisted position with hyperext of all DIPs)           Lower Extremity Assessment:  (R LE grossly 3-/5, lacking approx 5-10 degrees R ankle DF; L LE generally flaccid except 2-/5 L hip adduct, significan  extensor tone, tolerating only 15-20 degrees passive ROM L hip/knee, lacking approx 15-20 degrees L ankle DF)         Communication   Communication: No difficulties (speech slightly slower and hypophonic (improved with cuing not to whisper))  Cognition Arousal/Alertness: Awake/alert Behavior During Therapy: [facility] for tasks assessed/performed                        General Comments      Exercises        Assessment/Plan    PT Assessment Patient needs continued PT services  PT Diagnosis Difficulty walking;Generalized weakness   PT Problem  List Decreased strength;Decreased range of motion;Decreased activity tolerance;Decreased balance;Decreased mobility;Decreased coordination;Decreased knowledge of use of DME;Decreased safety awareness;Decreased knowledge of precautions;Obesity  PT Treatment Interventions DME instruction;Gait training;Stair training;Functional mobility training;Therapeutic activities;Therapeutic exercise;Balance training;Patient/family education;Neuromuscular re-education   PT Goals (Current goals can be found in the Care Plan section) Acute Rehab PT Goals Patient Stated Goal: "to go back home" PT Goal Formulation: With patient Time For Goal Achievement: 12/14/14 Potential to Achieve Goals: Fair    Frequency Min 2X/week   Barriers to discharge Decreased caregiver support      Co-evaluation               End of Session   Activity Tolerance: Patient limited by fatigue Patient left: in chair;with call bell/phone within reach;with bed alarm set           Time: [id number] PT Time Calculation (min) (ACUTE ONLY): 15 min   Charges:   PT Evaluation $PT Re-evaluation: 1 Procedure     PT G Codes:        Kyrell Ruacho H. Owens Shark, PT, DPT, NCS 11/30/2014, 11:24 AM [phone]

## 2014-11-30 NOTE — Progress Notes (Signed)
Report called to [employer] RN

## 2014-11-30 NOTE — NC FL2 (Signed)
[place] LEVEL OF CARE SCREENING TOOL     IDENTIFICATION  Patient Name: [name] Birthdate: 05-21-1940 Sex: female Admission Date (Current Location): 11/18/2014  [place] and [place] Number: Engineering geologist and Address:  [facility], [address], [place], [place] [address]      Provider Number: [id number]  Attending Physician Name and Address:  Max Sane, MD  Relative Name and Phone Number:       Current Level of Care: Hospital Recommended Level of Care: Nursing Facility Prior Approval Number:    Date Approved/Denied:   PASRR Number: [id number]  Discharge Plan: Home    Current Diagnoses: Patient Active Problem List   Diagnosis Date Noted  . Pneumonia 11/19/2014  . Community acquired pneumonia 11/18/2014  . Uncontrolled hypertension 11/18/2014  . LARYNGITIS, CHRONIC 08/01/2009  . HYPOKALEMIA 06/23/2009  . SHOULDER IMPINGEMENT SYNDROME, LEFT 04/20/2009  . DEGENERATIVE JOINT DISEASE 04/13/2009  . SHOULDER PAIN 04/10/2009  . HEMIPARESIS, LEFT 04/04/2009  . LACUNAR INFARCTION 04/04/2009  . HYPERGLYCEMIA 04/04/2009  . Nonspecific (abnormal) findings on radiological and other examination of body structure 10/22/2007  . ABNORMAL CHEST XRAY 10/22/2007  . GERD 10/13/2007  . HYPOTHYROIDISM 07/23/2006  . HYPERLIPIDEMIA 07/23/2006  . HYPERTENSION 07/23/2006  . ROSACEA 07/23/2006  . OSTEOPENIA 07/23/2006  . ALLERGY 07/23/2006    Orientation ACTIVITIES/SOCIAL BLADDER RESPIRATION    Self, Time, Situation, Place  Passive Incontinent O2 (As needed)  BEHAVIORAL SYMPTOMS/MOOD NEUROLOGICAL BOWEL NUTRITION STATUS      Continent Diet (Cardiac Diet and Low Fat, Low Cholesterol diet)  PHYSICIAN VISITS COMMUNICATION OF NEEDS Height & Weight Skin  30 days Verbally 5\' 1"  (154.9 cm) 188 lbs. Normal          AMBULATORY STATUS RESPIRATION    Assist extensive O2 (As needed)      Personal Care Assistance Level of  Assistance  Bathing, Feeding, Dressing Bathing Assistance: Limited assistance Feeding assistance: Limited assistance Dressing Assistance: Limited assistance      Functional Limitations Info  Sight, Hearing, Speech, Contractures Sight Info: Adequate Hearing Info: Adequate Speech Info: Impaired Contractures Info: Impaired     SPECIAL CARE FACTORS FREQUENCY  PT (By licensed PT)                   Additional Factors Info  Code Status, Allergies Code Status Info: Full Code Allergies Info: Ace Inhibitors, latex, tetracycline           DISCHARGE MEDICATIONS:   Current Discharge Medication List    START taking these medications   Details  ALPRAZolam (XANAX) 0.5 MG tablet Take 1 tablet (0.5 mg total) by mouth 3 (three) times daily as needed for anxiety. Qty: 5 tablet, Refills: 0    famotidine (PEPCID) 20 MG tablet Take 1 tablet (20 mg total) by mouth 2 (two) times daily. Qty: 60 tablet, Refills: 0    hydrocortisone cream 1 % Apply topically 2 (two) times daily. Qty: 30 g, Refills: 0    ipratropium-albuterol (DUONEB) 0.5-2.5 (3) MG/3ML SOLN Take 3 mLs by nebulization every 6 (six) hours as needed. Qty: 360 mL, Refills: 1    oxyCODONE-acetaminophen (PERCOCET/ROXICET) 5-325 MG tablet Take 2 tablets by mouth every 6 (six) hours as needed for severe pain. Qty: 5 tablet, Refills: 0    predniSONE (DELTASONE) 50 MG tablet One tablet daily for 5 days and then stop. Qty: 5 tablet, Refills: 0    senna-docusate (SENOKOT-S) 8.6-50 MG tablet Take 2 tablets by mouth 2 (  two) times daily. Qty: 60 tablet, Refills: 0      CONTINUE these medications which have CHANGED   Details  traMADol (ULTRAM) 50 MG tablet Take 1 tablet (50 mg total) by mouth every 8 (eight) hours as needed for moderate pain. Qty: 5 tablet, Refills: 0      CONTINUE these medications which have NOT CHANGED   Details  acetaminophen (TYLENOL) 500 MG tablet Take 500 mg by  mouth every 6 (six) hours as needed for mild pain.    amLODipine (NORVASC) 5 MG tablet Take 5 mg by mouth daily.    Calcium Carbonate-Vitamin D (CALCIUM 600+D) 600-400 MG-UNIT tablet Take 2 tablets by mouth 2 (two) times daily.    carvedilol (COREG) 3.125 MG tablet Take 3.125 mg by mouth 2 (two) times daily.    citalopram (CELEXA) 20 MG tablet Take 20 mg by mouth daily.    dipyridamole-aspirin (AGGRENOX) 200-25 MG 12hr capsule Take 1 capsule by mouth 2 (two) times daily.    gabapentin (NEURONTIN) 300 MG capsule Take 300 mg by mouth 3 (three) times daily.    Guaifenesin (MUCINEX MAXIMUM STRENGTH) 1200 MG TB12 Take 1,200 mg by mouth 2 (two) times daily.    hydrochlorothiazide (HYDRODIURIL) 25 MG tablet Take 25 mg by mouth daily.    losartan (COZAAR) 50 MG tablet Take 50 mg by mouth 2 (two) times daily.    lovastatin (MEVACOR) 20 MG tablet Take 20 mg by mouth at bedtime.    omeprazole (PRILOSEC) 20 MG capsule Take 20 mg by mouth daily.    oxybutynin (DITROPAN) 5 MG tablet Take 5 mg by mouth 2 (two) times daily.    tiZANidine (ZANAFLEX) 4 MG tablet Take 4 mg by mouth 4 (four) times daily.        Relevant Imaging Results:  Relevant Lab Results:  Recent Labs    Additional Information  (SS: [id number])  Darden Dates, LCSW

## 2014-11-30 NOTE — Discharge Instructions (Signed)
Anemia, Nonspecific Anemia is a condition in which the concentration of red blood cells or hemoglobin in the blood is below normal. Hemoglobin is a substance in red blood cells that carries oxygen to the tissues of the body. Anemia results in not enough oxygen reaching these tissues.  CAUSES  Common causes of anemia include:   Excessive bleeding. Bleeding may be internal or external. This includes excessive bleeding from periods (in women) or from the intestine.   Poor nutrition.   Chronic kidney, thyroid, and liver disease.  Bone marrow disorders that decrease red blood cell production.  Cancer and treatments for cancer.  HIV, AIDS, and their treatments.  Spleen problems that increase red blood cell destruction.  Blood disorders.  Excess destruction of red blood cells due to infection, medicines, and autoimmune disorders. SIGNS AND SYMPTOMS   Minor weakness.   Dizziness.   Headache.  Palpitations.   Shortness of breath, especially with exercise.   Paleness.  Cold sensitivity.  Indigestion.  Nausea.  Difficulty sleeping.  Difficulty concentrating. Symptoms may occur suddenly or they may develop slowly.  DIAGNOSIS  Additional blood tests are often needed. These help your health care provider determine the best treatment. Your health care provider will check your stool for blood and look for other causes of blood loss.  TREATMENT  Treatment varies depending on the cause of the anemia. Treatment can include:   Supplements of iron, vitamin B12, or folic acid.   Hormone medicines.   A blood transfusion. This may be needed if blood loss is severe.   Hospitalization. This may be needed if there is significant continual blood loss.   Dietary changes.  Spleen removal. HOME CARE INSTRUCTIONS Keep all follow-up appointments. It often takes many weeks to correct anemia, and having your health care provider check on your condition and your response to  treatment is very important. SEEK IMMEDIATE MEDICAL CARE IF:   You develop extreme weakness, shortness of breath, or chest pain.   You become dizzy or have trouble concentrating.  You develop heavy vaginal bleeding.   You develop a rash.   You have bloody or black, tarry stools.   You faint.   You vomit up blood.   You vomit repeatedly.   You have abdominal pain.  You have a fever or persistent symptoms for more than 2-3 days.   You have a fever and your symptoms suddenly get worse.   You are dehydrated.  MAKE SURE YOU:  Understand these instructions.  Will watch your condition.  Will get help right away if you are not doing well or get worse.   This information is not intended to replace advice given to you by your health care provider. Make sure you discuss any questions you have with your health care provider.   Document Released: 02/01/2004 Document Revised: 08/26/2012 Document Reviewed: 06/19/2012 Elsevier Interactive Patient Education 2016 Elsevier Inc.  

## 2014-11-30 NOTE — Clinical Social Work Placement (Signed)
   CLINICAL SOCIAL WORK PLACEMENT  NOTE  Date:  11/30/2014  Patient Details  Name: Niara Stahmer MRN: [id number] Date of Birth: 08-26-1940  Clinical Social Work is seeking post-discharge placement for this patient at the [place] level of care (*CSW will initial, date and re-position this form in  chart as items are completed):  Yes   Patient/family provided with [place] Work Department's list of facilities offering this level of care within the geographic area requested by the patient (or if unable, by the patient's family).  Yes   Patient/family informed of their freedom to choose among providers that offer the needed level of care, that participate in Medicare, Medicaid or managed care program needed by the patient, have an available bed and are willing to accept the patient.  Yes   Patient/family informed of [place]'s ownership interest in [facility] and [facility], as well as of the fact that they are under no obligation to receive care at these facilities.  PASRR submitted to EDS on       PASRR number received on       Existing PASRR number confirmed on 11/24/14     FL2 transmitted to all facilities in geographic area requested by pt/family on 11/24/14     FL2 transmitted to all facilities within larger geographic area on       Patient informed that his/her managed care company has contracts with or will negotiate with certain facilities, including the following:        Yes   Patient/family informed of bed offers received.  Patient chooses bed at       Physician recommends and patient chooses bed at      Patient to be transferred to   on  .  Patient to be transferred to facility by       Patient family notified on   of transfer.  Name of family member notified:        PHYSICIAN Please sign FL2     Additional Comment:    _______________________________________________ Darden Dates, LCSW 11/30/2014,  12:17 PM

## 2014-11-30 NOTE — Progress Notes (Signed)
Called EMS for transport, also called husband and made him aware that report called to [facility] and that EMS has been called to transport pt, husband states that he is walking into the hospital visitor entrance as we spoke, on his way to pts room

## 2014-11-30 NOTE — Consult Note (Signed)
   [facility] Inpatient Consult   11/30/2014  [place] Nov 25, 1940 [id number]   [facility] Care Management EPIC referral received. Called into patient's room and spoke with her briefly. She asked Probation officer to speak with her husband. Spoke with Mr. [name] (husband) to explain [place] [facility] services and he states " we do not need it" and hung up the phone. Will make inpatient RNCM aware [facility] services were declined.    Marthenia Rolling, MSN-Ed, RN,BSN [facility] Liaison [phone]

## 2014-11-30 NOTE — Clinical Social Work Note (Signed)
Pt is ready for discharge to [facility]. [facility] is in place. Pt and spouse are agreeable to discharge plan. RN to call report and EMS will provide transportation. CSW is signing off as no further needs identified.   Darden Dates, MSW, LCSW Clinical Social Worker  [phone]

## 2014-11-30 NOTE — Plan of Care (Signed)
Problem: Safety: Goal: Ability to remain free from injury will improve Outcome: Progressing High fall risk. Bed alarm on, hourly rounding. Safe environment provided. Pt understands how to use call system for assistance.   Problem: Pain Managment: Goal: General experience of comfort will improve No c/o pain, resting in bed throughout the night. Q2h turning to prevent skin breakdown.   Problem: Respiratory: Goal: Respiratory status will improve Outcome: Progressing O2 sats stable on room air. Cough/deep breathing encouraged.

## 2014-11-30 NOTE — Progress Notes (Signed)
Pt being discharged via EMS at this time, pt with no noted complaints at discharge

## 2014-12-01 DIAGNOSIS — E119 Type 2 diabetes mellitus without complications: Secondary | ICD-10-CM | POA: Diagnosis not present

## 2014-12-01 DIAGNOSIS — Z794 Long term (current) use of insulin: Secondary | ICD-10-CM | POA: Diagnosis not present

## 2014-12-01 LAB — GLUCOSE, CAPILLARY
GLUCOSE-CAPILLARY: 100 mg/dL — AB (ref 65–99)
Glucose-Capillary: 124 mg/dL — ABNORMAL HIGH (ref 65–99)
Glucose-Capillary: 164 mg/dL — ABNORMAL HIGH (ref 65–99)

## 2014-12-02 DIAGNOSIS — E119 Type 2 diabetes mellitus without complications: Secondary | ICD-10-CM | POA: Diagnosis not present

## 2014-12-02 LAB — GLUCOSE, CAPILLARY
GLUCOSE-CAPILLARY: 113 mg/dL — AB (ref 65–99)
Glucose-Capillary: 105 mg/dL — ABNORMAL HIGH (ref 65–99)
Glucose-Capillary: 98 mg/dL (ref 65–99)
Glucose-Capillary: 114 mg/dL — ABNORMAL HIGH (ref 65–99)

## 2014-12-03 DIAGNOSIS — E119 Type 2 diabetes mellitus without complications: Secondary | ICD-10-CM | POA: Diagnosis not present

## 2014-12-03 LAB — GLUCOSE, CAPILLARY
GLUCOSE-CAPILLARY: 86 mg/dL (ref 65–99)
GLUCOSE-CAPILLARY: 142 mg/dL — AB (ref 65–99)
Glucose-Capillary: 96 mg/dL (ref 65–99)
Glucose-Capillary: 118 mg/dL — ABNORMAL HIGH (ref 65–99)

## 2014-12-04 DIAGNOSIS — E119 Type 2 diabetes mellitus without complications: Secondary | ICD-10-CM | POA: Diagnosis not present

## 2014-12-04 LAB — GLUCOSE, CAPILLARY
GLUCOSE-CAPILLARY: 105 mg/dL — AB (ref 65–99)
GLUCOSE-CAPILLARY: 122 mg/dL — AB (ref 65–99)
GLUCOSE-CAPILLARY: 119 mg/dL — AB (ref 65–99)
Glucose-Capillary: 115 mg/dL — ABNORMAL HIGH (ref 65–99)

## 2014-12-05 DIAGNOSIS — E119 Type 2 diabetes mellitus without complications: Secondary | ICD-10-CM | POA: Diagnosis not present

## 2014-12-05 LAB — GLUCOSE, CAPILLARY
GLUCOSE-CAPILLARY: 108 mg/dL — AB (ref 65–99)
Glucose-Capillary: 84 mg/dL (ref 65–99)

## 2014-12-08 ENCOUNTER — Encounter: Admission: RE | Admit: 2014-12-08 | Payer: PPO | Source: Ambulatory Visit | Admitting: Internal Medicine

## 2014-12-08 ENCOUNTER — Encounter
Admission: RE | Admit: 2014-12-08 | Discharge: 2014-12-08 | Disposition: A | Discharge status: Home / Self Care | Payer: PPO | Source: Ambulatory Visit | Attending: Internal Medicine | Admitting: Internal Medicine

## 2015-01-08 ENCOUNTER — Encounter
Admission: RE | Admit: 2015-01-08 | Discharge: 2015-01-08 | Disposition: A | Discharge status: Home / Self Care | Payer: PPO | Source: Ambulatory Visit | Attending: Internal Medicine | Admitting: Internal Medicine

## 2015-01-08 ENCOUNTER — Encounter: Admit: 2015-01-08 | Payer: PPO

## 2015-02-08 ENCOUNTER — Encounter
Admission: RE | Admit: 2015-02-08 | Discharge: 2015-02-08 | Disposition: A | Discharge status: Home / Self Care | Payer: PPO | Source: Ambulatory Visit | Attending: Internal Medicine | Admitting: Internal Medicine

## 2015-02-28 DIAGNOSIS — M79605 Pain in left leg: Secondary | ICD-10-CM | POA: Diagnosis not present

## 2015-02-28 DIAGNOSIS — M79602 Pain in left arm: Secondary | ICD-10-CM | POA: Diagnosis not present

## 2015-02-28 DIAGNOSIS — I69354 Hemiplegia and hemiparesis following cerebral infarction affecting left non-dominant side: Secondary | ICD-10-CM | POA: Diagnosis not present

## 2015-02-28 DIAGNOSIS — I69398 Other sequelae of cerebral infarction: Secondary | ICD-10-CM | POA: Diagnosis not present

## 2015-02-28 DIAGNOSIS — M21272 Flexion deformity, left ankle and toes: Secondary | ICD-10-CM | POA: Diagnosis not present

## 2015-03-08 ENCOUNTER — Encounter
Admission: RE | Admit: 2015-03-08 | Discharge: 2015-03-08 | Disposition: A | Discharge status: Home / Self Care | Payer: PPO | Source: Ambulatory Visit | Attending: Internal Medicine | Admitting: Internal Medicine

## 2015-03-08 DIAGNOSIS — M79602 Pain in left arm: Secondary | ICD-10-CM | POA: Diagnosis not present

## 2015-03-08 DIAGNOSIS — M2450 Contracture, unspecified joint: Secondary | ICD-10-CM | POA: Diagnosis not present

## 2015-03-08 DIAGNOSIS — M79605 Pain in left leg: Secondary | ICD-10-CM | POA: Diagnosis not present

## 2015-03-08 DIAGNOSIS — M21272 Flexion deformity, left ankle and toes: Secondary | ICD-10-CM | POA: Diagnosis not present

## 2015-03-08 DIAGNOSIS — I69354 Hemiplegia and hemiparesis following cerebral infarction affecting left non-dominant side: Secondary | ICD-10-CM | POA: Diagnosis not present

## 2015-03-08 DIAGNOSIS — R279 Unspecified lack of coordination: Secondary | ICD-10-CM | POA: Diagnosis not present

## 2015-03-08 DIAGNOSIS — M24542 Contracture, left hand: Secondary | ICD-10-CM | POA: Diagnosis not present

## 2015-03-08 DIAGNOSIS — M6281 Muscle weakness (generalized): Secondary | ICD-10-CM | POA: Diagnosis not present

## 2015-03-08 DIAGNOSIS — I69398 Other sequelae of cerebral infarction: Secondary | ICD-10-CM | POA: Diagnosis not present

## 2015-03-09 DIAGNOSIS — I69354 Hemiplegia and hemiparesis following cerebral infarction affecting left non-dominant side: Secondary | ICD-10-CM | POA: Diagnosis not present

## 2015-03-09 DIAGNOSIS — M79602 Pain in left arm: Secondary | ICD-10-CM | POA: Diagnosis not present

## 2015-03-09 DIAGNOSIS — M79605 Pain in left leg: Secondary | ICD-10-CM | POA: Diagnosis not present

## 2015-03-09 DIAGNOSIS — M6281 Muscle weakness (generalized): Secondary | ICD-10-CM | POA: Diagnosis not present

## 2015-03-09 DIAGNOSIS — M2450 Contracture, unspecified joint: Secondary | ICD-10-CM | POA: Diagnosis not present

## 2015-03-09 DIAGNOSIS — M21272 Flexion deformity, left ankle and toes: Secondary | ICD-10-CM | POA: Diagnosis not present

## 2015-03-09 DIAGNOSIS — I69398 Other sequelae of cerebral infarction: Secondary | ICD-10-CM | POA: Diagnosis not present

## 2015-03-09 DIAGNOSIS — M24542 Contracture, left hand: Secondary | ICD-10-CM | POA: Diagnosis not present

## 2015-03-09 DIAGNOSIS — R279 Unspecified lack of coordination: Secondary | ICD-10-CM | POA: Diagnosis not present

## 2015-03-14 DIAGNOSIS — I6350 Cerebral infarction due to unspecified occlusion or stenosis of unspecified cerebral artery: Secondary | ICD-10-CM | POA: Diagnosis not present

## 2015-03-14 DIAGNOSIS — F329 Major depressive disorder, single episode, unspecified: Secondary | ICD-10-CM | POA: Diagnosis not present

## 2015-03-14 DIAGNOSIS — I10 Essential (primary) hypertension: Secondary | ICD-10-CM | POA: Diagnosis not present

## 2015-03-14 DIAGNOSIS — J449 Chronic obstructive pulmonary disease, unspecified: Secondary | ICD-10-CM | POA: Diagnosis not present

## 2015-03-21 DIAGNOSIS — M21372 Foot drop, left foot: Secondary | ICD-10-CM | POA: Diagnosis not present

## 2015-04-08 ENCOUNTER — Encounter
Admission: RE | Admit: 2015-04-08 | Discharge: 2015-04-08 | Disposition: A | Discharge status: Home / Self Care | Payer: PPO | Source: Ambulatory Visit | Attending: Internal Medicine | Admitting: Internal Medicine

## 2015-04-10 DIAGNOSIS — M79605 Pain in left leg: Secondary | ICD-10-CM | POA: Diagnosis not present

## 2015-04-10 DIAGNOSIS — M21272 Flexion deformity, left ankle and toes: Secondary | ICD-10-CM | POA: Diagnosis not present

## 2015-04-10 DIAGNOSIS — M25579 Pain in unspecified ankle and joints of unspecified foot: Secondary | ICD-10-CM | POA: Diagnosis not present

## 2015-04-10 DIAGNOSIS — R279 Unspecified lack of coordination: Secondary | ICD-10-CM | POA: Diagnosis not present

## 2015-04-10 DIAGNOSIS — R609 Edema, unspecified: Secondary | ICD-10-CM | POA: Diagnosis not present

## 2015-04-10 DIAGNOSIS — M24542 Contracture, left hand: Secondary | ICD-10-CM | POA: Diagnosis not present

## 2015-04-10 DIAGNOSIS — I69354 Hemiplegia and hemiparesis following cerebral infarction affecting left non-dominant side: Secondary | ICD-10-CM | POA: Diagnosis not present

## 2015-04-10 DIAGNOSIS — M79602 Pain in left arm: Secondary | ICD-10-CM | POA: Diagnosis not present

## 2015-04-10 DIAGNOSIS — I69398 Other sequelae of cerebral infarction: Secondary | ICD-10-CM | POA: Diagnosis not present

## 2015-04-13 DIAGNOSIS — F419 Anxiety disorder, unspecified: Secondary | ICD-10-CM | POA: Diagnosis not present

## 2015-04-13 DIAGNOSIS — F329 Major depressive disorder, single episode, unspecified: Secondary | ICD-10-CM | POA: Diagnosis not present

## 2015-05-01 DIAGNOSIS — F329 Major depressive disorder, single episode, unspecified: Secondary | ICD-10-CM | POA: Diagnosis not present

## 2015-05-01 DIAGNOSIS — F419 Anxiety disorder, unspecified: Secondary | ICD-10-CM | POA: Diagnosis not present

## 2015-05-08 ENCOUNTER — Encounter
Admission: RE | Admit: 2015-05-08 | Discharge: 2015-05-08 | Disposition: A | Discharge status: Home / Self Care | Payer: PPO | Source: Ambulatory Visit | Attending: Internal Medicine | Admitting: Internal Medicine

## 2015-05-09 DIAGNOSIS — M21272 Flexion deformity, left ankle and toes: Secondary | ICD-10-CM | POA: Diagnosis not present

## 2015-05-09 DIAGNOSIS — I69354 Hemiplegia and hemiparesis following cerebral infarction affecting left non-dominant side: Secondary | ICD-10-CM | POA: Diagnosis not present

## 2015-05-16 DIAGNOSIS — J449 Chronic obstructive pulmonary disease, unspecified: Secondary | ICD-10-CM | POA: Diagnosis not present

## 2015-05-16 DIAGNOSIS — F329 Major depressive disorder, single episode, unspecified: Secondary | ICD-10-CM | POA: Diagnosis not present

## 2015-05-16 DIAGNOSIS — I6350 Cerebral infarction due to unspecified occlusion or stenosis of unspecified cerebral artery: Secondary | ICD-10-CM | POA: Diagnosis not present

## 2015-06-08 ENCOUNTER — Encounter
Admission: RE | Admit: 2015-06-08 | Discharge: 2015-06-08 | Disposition: A | Discharge status: Home / Self Care | Payer: PPO | Source: Ambulatory Visit | Attending: Internal Medicine | Admitting: Internal Medicine

## 2015-06-09 DIAGNOSIS — M24542 Contracture, left hand: Secondary | ICD-10-CM | POA: Diagnosis not present

## 2015-06-09 DIAGNOSIS — M79605 Pain in left leg: Secondary | ICD-10-CM | POA: Diagnosis not present

## 2015-06-09 DIAGNOSIS — M21272 Flexion deformity, left ankle and toes: Secondary | ICD-10-CM | POA: Diagnosis not present

## 2015-06-09 DIAGNOSIS — R279 Unspecified lack of coordination: Secondary | ICD-10-CM | POA: Diagnosis not present

## 2015-06-09 DIAGNOSIS — I69398 Other sequelae of cerebral infarction: Secondary | ICD-10-CM | POA: Diagnosis not present

## 2015-06-09 DIAGNOSIS — M79602 Pain in left arm: Secondary | ICD-10-CM | POA: Diagnosis not present

## 2015-06-09 DIAGNOSIS — I69354 Hemiplegia and hemiparesis following cerebral infarction affecting left non-dominant side: Secondary | ICD-10-CM | POA: Diagnosis not present

## 2015-06-21 DIAGNOSIS — M216X2 Other acquired deformities of left foot: Secondary | ICD-10-CM | POA: Diagnosis not present

## 2015-06-21 DIAGNOSIS — M7752 Other enthesopathy of left foot: Secondary | ICD-10-CM | POA: Diagnosis not present

## 2015-06-29 DIAGNOSIS — M216X2 Other acquired deformities of left foot: Secondary | ICD-10-CM | POA: Diagnosis not present

## 2015-06-30 DIAGNOSIS — R270 Ataxia, unspecified: Secondary | ICD-10-CM | POA: Diagnosis not present

## 2015-06-30 DIAGNOSIS — J449 Chronic obstructive pulmonary disease, unspecified: Secondary | ICD-10-CM | POA: Diagnosis not present

## 2015-06-30 DIAGNOSIS — Z8673 Personal history of transient ischemic attack (TIA), and cerebral infarction without residual deficits: Secondary | ICD-10-CM | POA: Diagnosis not present

## 2015-06-30 DIAGNOSIS — G8194 Hemiplegia, unspecified affecting left nondominant side: Secondary | ICD-10-CM | POA: Diagnosis not present

## 2015-07-06 ENCOUNTER — Other Ambulatory Visit: Payer: PPO

## 2015-07-08 ENCOUNTER — Encounter
Admission: RE | Admit: 2015-07-08 | Discharge: 2015-07-08 | Disposition: A | Discharge status: Home / Self Care | Payer: PPO | Source: Ambulatory Visit | Attending: Internal Medicine | Admitting: Internal Medicine

## 2015-07-08 DIAGNOSIS — I10 Essential (primary) hypertension: Secondary | ICD-10-CM | POA: Insufficient documentation

## 2015-07-12 DIAGNOSIS — M79602 Pain in left arm: Secondary | ICD-10-CM | POA: Diagnosis not present

## 2015-07-12 DIAGNOSIS — I69398 Other sequelae of cerebral infarction: Secondary | ICD-10-CM | POA: Diagnosis not present

## 2015-07-12 DIAGNOSIS — R279 Unspecified lack of coordination: Secondary | ICD-10-CM | POA: Diagnosis not present

## 2015-07-12 DIAGNOSIS — I69354 Hemiplegia and hemiparesis following cerebral infarction affecting left non-dominant side: Secondary | ICD-10-CM | POA: Diagnosis not present

## 2015-07-12 DIAGNOSIS — M24542 Contracture, left hand: Secondary | ICD-10-CM | POA: Diagnosis not present

## 2015-07-12 DIAGNOSIS — M21272 Flexion deformity, left ankle and toes: Secondary | ICD-10-CM | POA: Diagnosis not present

## 2015-07-12 DIAGNOSIS — M79605 Pain in left leg: Secondary | ICD-10-CM | POA: Diagnosis not present

## 2015-07-14 ENCOUNTER — Ambulatory Visit: Admission: RE | Admit: 2015-07-14 | Payer: PPO | Source: Ambulatory Visit | Admitting: Podiatry

## 2015-07-14 ENCOUNTER — Encounter: Admission: RE | Payer: Self-pay | Source: Ambulatory Visit

## 2015-07-14 SURGERY — FLAT FOOT RECONSTRUCTION-TAL GASTROC RECESSION
Anesthesia: Choice | Laterality: Left

## 2015-07-21 ENCOUNTER — Non-Acute Institutional Stay (SKILLED_NURSING_FACILITY): Payer: PPO | Admitting: Gerontology

## 2015-07-21 DIAGNOSIS — M6289 Other specified disorders of muscle: Secondary | ICD-10-CM | POA: Insufficient documentation

## 2015-07-21 DIAGNOSIS — Z8673 Personal history of transient ischemic attack (TIA), and cerebral infarction without residual deficits: Secondary | ICD-10-CM | POA: Insufficient documentation

## 2015-07-21 DIAGNOSIS — R0989 Other specified symptoms and signs involving the circulatory and respiratory systems: Secondary | ICD-10-CM | POA: Diagnosis not present

## 2015-07-21 DIAGNOSIS — G8190 Hemiplegia, unspecified affecting unspecified side: Secondary | ICD-10-CM | POA: Diagnosis not present

## 2015-07-21 DIAGNOSIS — M25512 Pain in left shoulder: Secondary | ICD-10-CM | POA: Diagnosis not present

## 2015-07-21 DIAGNOSIS — F4310 Post-traumatic stress disorder, unspecified: Secondary | ICD-10-CM | POA: Insufficient documentation

## 2015-07-21 DIAGNOSIS — R062 Wheezing: Secondary | ICD-10-CM | POA: Diagnosis not present

## 2015-07-21 DIAGNOSIS — I10 Essential (primary) hypertension: Secondary | ICD-10-CM | POA: Insufficient documentation

## 2015-07-21 NOTE — Progress Notes (Signed)
Location:  [facility] Service:  [phone]) Provider:  Toni Arthurs, NP-C  [name], MD  Patient Care Team: [name], MD as PCP - General (Family Medicine)  Extended Emergency Contact Information Primary Emergency Contact: Norm Salt Address: [address]          [name] Phone: [phone] Mobile Phone: [phone] Relation: Spouse Secondary Emergency Contact: ST GERMAIN,[name] Address: [place], [place] [address] Home Phone: [phone] Relation: None  Code Status:  Full Goals of care: Advanced Directive information Advanced Directives 11/19/2014  Does patient have an advance directive? No  Would patient like information on creating an advanced directive? No - patient declined information     Chief Complaint  Patient presents with  . Medical Management of Chronic Issues    HPI:  Pt is a 75 y.o. female seen today for medical management of chronic diseases. She has had a CVA and has Left hemiplegia. Her left arm is very stiff and left hand has significant contractures. Left foot with achilles tendon contracture causing foot to plantarflex making walking impossible. Pt still believes she is going to recover fully from the stroke and go home. She c/o pain in the Left shoulder, making movement difficult. Shoulder is stiff and causes constant pain.      Past Medical History  Diagnosis Date  . Stroke ([place])   . Hypertension   . TIA (transient ischemic attack)    Past Surgical History  Procedure Laterality Date  . Abdominal hysterectomy    . Appendectomy      Allergies  Allergen Reactions  . Ace Inhibitors Cough  . Latex Hives  . Tetracycline Other (See Comments)    Reaction:  Unknown       Medication List       This list is accurate as of: 07/21/15  5:29 PM.  Always use your most recent med list.               acetaminophen 500 MG  tablet  Commonly known as:  TYLENOL  Take 500 mg by mouth every 6 (six) hours as needed for mild pain.     ALPRAZolam 0.5 MG tablet  Commonly known as:  XANAX  Take 1 tablet (0.5 mg total) by mouth 3 (three) times daily as needed for anxiety.     amLODipine 5 MG tablet  Commonly known as:  NORVASC  Take 5 mg by mouth daily.     CALCIUM 600+D 600-400 MG-UNIT tablet  Generic drug:  Calcium Carbonate-Vitamin D  Take 2 tablets by mouth 2 (two) times daily.     carvedilol 3.125 MG tablet  Commonly known as:  COREG  Take 3.125 mg by mouth 2 (two) times daily.     citalopram 20 MG tablet  Commonly known as:  CELEXA  Take 20 mg by mouth daily.     dipyridamole-aspirin 200-25 MG 12hr capsule  Commonly known as:  AGGRENOX  Take 1 capsule by mouth 2 (two) times daily.     famotidine 20 MG tablet  Commonly known as:  PEPCID  Take 1 tablet (20 mg total) by mouth 2 (two) times daily.     gabapentin 300 MG capsule  Commonly known as:  NEURONTIN  Take 300 mg by mouth 3 (three) times daily.     hydrochlorothiazide 25 MG tablet  Commonly known as:  HYDRODIURIL  Take 25 mg by mouth daily.     hydrocortisone cream 1 %  Apply topically 2 (two) times daily.     ipratropium-albuterol 0.5-2.5 (3) MG/3ML Soln  Commonly known as:  DUONEB  Take 3 mLs by nebulization every 6 (six) hours as needed.     LORazepam 0.5 MG tablet  Commonly known as:  ATIVAN  Take 0.5 mg by mouth 2 (two) times daily.     losartan 50 MG tablet  Commonly known as:  COZAAR  Take 50 mg by mouth 2 (two) times daily.     lovastatin 20 MG tablet  Commonly known as:  MEVACOR  Take 20 mg by mouth at bedtime.     montelukast 10 MG tablet  Commonly known as:  SINGULAIR  Take 10 mg by mouth at bedtime.     MUCINEX MAXIMUM STRENGTH 1200 MG Tb12  Generic drug:  Guaifenesin  Take 1,200 mg by mouth 2 (two) times daily.     omeprazole 20 MG capsule  Commonly known as:  PRILOSEC  Take 20 mg by mouth daily.      oxybutynin 5 MG tablet  Commonly known as:  DITROPAN  Take 5 mg by mouth 2 (two) times daily.     oxyCODONE-acetaminophen 5-325 MG tablet  Commonly known as:  PERCOCET/ROXICET  Take 2 tablets by mouth every 6 (six) hours as needed for severe pain.     predniSONE 50 MG tablet  Commonly known as:  DELTASONE  One tablet daily for 5 days and then stop.     senna-docusate 8.6-50 MG tablet  Commonly known as:  Senokot-S  Take 2 tablets by mouth 2 (two) times daily.     tiZANidine 4 MG tablet  Commonly known as:  ZANAFLEX  Take 4 mg by mouth 4 (four) times daily.     traMADol 50 MG tablet  Commonly known as:  ULTRAM  Take 1 tablet (50 mg total) by mouth every 8 (eight) hours as needed for moderate pain.     trolamine salicylate 10 % cream  Commonly known as:  ASPERCREME  Apply 1 application topically as needed for muscle pain.     venlafaxine XR 75 MG 24 hr capsule  Commonly known as:  EFFEXOR-XR  Take 75 mg by mouth daily with breakfast.        Review of Systems  Constitutional: Negative.   HENT: Negative.   Respiratory: Positive for cough. Negative for choking, chest tightness and shortness of breath.   Cardiovascular: Negative for chest pain and leg swelling.  Gastrointestinal: Negative.   Genitourinary: Negative.   Musculoskeletal: Positive for arthralgias and neck stiffness.  Skin: Negative.   Neurological: Positive for speech difficulty.  Psychiatric/Behavioral: Positive for dysphoric mood.  All other systems reviewed and are negative.   Immunization History  Administered Date(s) Administered  . Influenza Whole 01/08/2004, 10/08/2006, 11/22/2008  . Pneumococcal Polysaccharide-23 10/24/2000, 11/30/2014  . Td 01/07/2001  . Zoster 01/07/2006   Pertinent  Health Maintenance Due  Topic Date Due  . COLONOSCOPY  11/20/1989  . DEXA SCAN  11/20/2004  . INFLUENZA VACCINE  08/08/2015  . PNA vac Low Risk Adult (2 of 2 - PCV13) 11/30/2015   No flowsheet data  found. Functional Status Survey:    Filed Vitals:   07/21/15 1724  BP: 138/70  Pulse: 80  Temp: 98 F (36.7 C)  Resp: 20  SpO2: 96%   There is no weight on file to calculate BMI. Physical Exam  Constitutional: She appears well-developed and well-nourished. No distress.  HENT:  Head: Normocephalic and atraumatic.  Eyes: Conjunctivae and EOM are normal. Pupils are equal, round, and reactive to light.  Neck: Normal range of motion. Neck supple. No JVD present. No tracheal deviation present. No thyromegaly present.  Cardiovascular: Normal rate, regular rhythm, normal heart sounds and intact distal pulses.  Exam reveals no gallop and no friction rub.   No murmur heard. Pulmonary/Chest: Effort normal. No accessory muscle usage. No respiratory distress. She has rhonchi in the right upper field and the right middle field. She has rales in the right upper field and the right middle field.  Abdominal: Soft. Bowel sounds are normal. She exhibits no distension. There is no tenderness. There is no rebound.  Musculoskeletal: She exhibits no edema.       Left shoulder: She exhibits decreased range of motion, tenderness and pain.  Lymphadenopathy:    She has no cervical adenopathy.  Neurological: She is alert. She is disoriented.  Skin: Skin is warm, dry and intact. She is not diaphoretic. No cyanosis. No pallor. Nails show no clubbing.  Psychiatric: Her affect is blunt (dysmorphic).  Nursing note and vitals reviewed.   Labs reviewed:  Recent Labs  11/21/14 1310  11/29/14 0438 11/29/14 0830 11/29/14 1403 11/30/14 0504  NA  --   < > 130*  --  127* 130*  K 3.3*  < > 2.9*  --  4.1 4.3  CL  --   < > 91*  --  92* 96*  CO2  --   < > 32  --  29 27  GLUCOSE  --   < > 92  --  108* 86  BUN  --   < > 26*  --  26* 24*  CREATININE  --   < > 0.55  --  0.64 0.54  CALCIUM  --   < > 10.2  --  9.8 9.9  MG 1.9  --   --  1.6*  --   --   < > = values in this interval not displayed.  Recent Labs   11/18/14 1821 11/25/14 1356 11/29/14 0438  AST _0 ALT _1 ALKPHOS 50 31* 34*  BILITOT 0.3 0.6 0.9  PROT 7.4 5.7* 5.3*  ALBUMIN 4.3 3.2* 3.0*    Recent Labs  11/18/14 1821  11/25/14 1356  11/28/14 0240 11/29/14 0438 11/30/14 0504  WBC 7.0  < > 16.0*  < > 18.2* 12.4* 14.5*  NEUTROABS 4.5  --  12.0*  --   --   --   --   HGB 13.0  < > 8.9*  < > 8.3* 7.4* 11.3*  HCT 37.2  < > 26.2*  < > 24.0* 21.2* 33.6*  MCV 90.9  < > 91.2  < > 91.1 91.7 86.2  PLT 220  < > 254  < > 281 233 253  < > = values in this interval not displayed. Lab Results  Component Value Date   TSH 0.834 11/26/2014   Lab Results  Component Value Date   HGBA1C 5.6 06/23/2009   Lab Results  Component Value Date   CHOL 153 03/26/2013   HDL 45 03/26/2013   LDLCALC 79 03/26/2013   LDLDIRECT 161.4 12/14/2008   TRIG 146 03/26/2013   CHOLHDL 3.9 05/05/2009    Significant Diagnostic Results in last 30 days:  No results found.  Assessment/Plan 1. Hemiplegia ([place])  F/U with PT/OT for contractures  SLP for swallow eval for increased difficulty swallowing  OT to fit for padded arm rest to wheelchair  2. Pain in joint of left shoulder  Complete view xray, left shoulder  Aspercreme 10%- thin film TID- left shoulder   Family/ staff Communication  Total Time: 45 minutes  Documentation: 15 minutes  Face to Face: 30 minutes  Family/Phone:   Labs/tests ordered:  2V-CXR, Left shoulder complete view. Cbc. Met c. Tsh. Lipid panel, mag, B12, D levels    Margaret Ports, NP-C [facility] [phone] [place], [place] [address] Cell Phone (Mon-Fri 8am-5pm):  [phone] On Call:  [phone] & follow prompts after 5pm & weekends Office Phone:  [phone] Office Fax:  [phone]

## 2015-07-25 DIAGNOSIS — I10 Essential (primary) hypertension: Secondary | ICD-10-CM | POA: Diagnosis not present

## 2015-07-25 LAB — COMPREHENSIVE METABOLIC PANEL
ALT: 24 U/L (ref 14–54)
ANION GAP: 5 (ref 5–15)
AST: 20 U/L (ref 15–41)
Albumin: 3.7 g/dL (ref 3.5–5.0)
Alkaline Phosphatase: 69 U/L (ref 38–126)
BUN: 10 mg/dL (ref 6–20)
CHLORIDE: 96 mmol/L — AB (ref 101–111)
CO2: 33 mmol/L — ABNORMAL HIGH (ref 22–32)
Calcium: 9.9 mg/dL (ref 8.9–10.3)
Creatinine, Ser: 0.51 mg/dL (ref 0.44–1.00)
Glucose, Bld: 89 mg/dL (ref 65–99)
POTASSIUM: 3.4 mmol/L — AB (ref 3.5–5.1)
Sodium: 134 mmol/L — ABNORMAL LOW (ref 135–145)
Total Bilirubin: 0.4 mg/dL (ref 0.3–1.2)
Total Protein: 6.6 g/dL (ref 6.5–8.1)

## 2015-07-25 LAB — LIPID PANEL
CHOL/HDL RATIO: 4.9 ratio
CHOLESTEROL: 201 mg/dL — AB (ref 0–200)
HDL: 41 mg/dL (ref >40)
LDL Cholesterol: 138 mg/dL — ABNORMAL HIGH (ref 0–99)
TRIGLYCERIDES: 108 mg/dL (ref <150)
VLDL: 22 mg/dL (ref 0–40)

## 2015-07-25 LAB — CBC WITH DIFFERENTIAL/PLATELET
Basophils Absolute: 0 10*3/uL (ref 0–0.1)
Basophils Relative: 1 %
EOS PCT: 3 %
Eosinophils Absolute: 0.2 10*3/uL (ref 0–0.7)
HCT: 37.2 % (ref 35.0–47.0)
Hemoglobin: 13 g/dL (ref 12.0–16.0)
LYMPHS PCT: 30 %
Lymphs Abs: 1.4 10*3/uL (ref 1.0–3.6)
MCH: 31.3 pg (ref 26.0–34.0)
MCHC: 34.8 g/dL (ref 32.0–36.0)
MCV: 89.9 fL (ref 80.0–100.0)
MONO ABS: 0.4 10*3/uL (ref 0.2–0.9)
Monocytes Relative: 8 %
Neutro Abs: 2.8 10*3/uL (ref 1.4–6.5)
Neutrophils Relative %: 58 %
PLATELETS: 212 10*3/uL (ref 150–440)
RBC: 4.14 MIL/uL (ref 3.80–5.20)
RDW: 11.5 % (ref 11.5–14.5)
WBC: 4.7 10*3/uL (ref 3.6–11.0)

## 2015-07-25 LAB — VITAMIN B12: VITAMIN B 12: 439 pg/mL (ref 180–914)

## 2015-07-25 LAB — MAGNESIUM: Magnesium: 1.7 mg/dL (ref 1.7–2.4)

## 2015-07-25 LAB — TSH: TSH: 4.585 u[IU]/mL — ABNORMAL HIGH (ref 0.350–4.500)

## 2015-07-26 LAB — VITAMIN D 25 HYDROXY (VIT D DEFICIENCY, FRACTURES): Vit D, 25-Hydroxy: 55.7 ng/mL (ref 30.0–100.0)

## 2015-08-08 ENCOUNTER — Encounter
Admission: RE | Admit: 2015-08-08 | Discharge: 2015-08-08 | Disposition: A | Discharge status: Home / Self Care | Payer: PPO | Source: Ambulatory Visit | Attending: Internal Medicine | Admitting: Internal Medicine

## 2015-08-08 DIAGNOSIS — R262 Difficulty in walking, not elsewhere classified: Secondary | ICD-10-CM | POA: Diagnosis not present

## 2015-08-08 DIAGNOSIS — I69354 Hemiplegia and hemiparesis following cerebral infarction affecting left non-dominant side: Secondary | ICD-10-CM | POA: Diagnosis not present

## 2015-08-08 DIAGNOSIS — R293 Abnormal posture: Secondary | ICD-10-CM | POA: Diagnosis not present

## 2015-08-08 DIAGNOSIS — Z8673 Personal history of transient ischemic attack (TIA), and cerebral infarction without residual deficits: Secondary | ICD-10-CM | POA: Diagnosis not present

## 2015-08-10 DIAGNOSIS — M216X2 Other acquired deformities of left foot: Secondary | ICD-10-CM | POA: Diagnosis not present

## 2015-08-17 ENCOUNTER — Encounter: Payer: Self-pay | Admitting: *Deleted

## 2015-08-17 ENCOUNTER — Encounter
Admission: RE | Admit: 2015-08-17 | Discharge: 2015-08-17 | Disposition: A | Discharge status: Home / Self Care | Payer: PPO | Source: Ambulatory Visit | Attending: Podiatry | Admitting: Podiatry

## 2015-08-17 DIAGNOSIS — Z01812 Encounter for preprocedural laboratory examination: Secondary | ICD-10-CM | POA: Insufficient documentation

## 2015-08-17 HISTORY — DX: Unspecified asthma, uncomplicated: J45.909

## 2015-08-17 HISTORY — DX: Hyperlipidemia, unspecified: E78.5

## 2015-08-17 HISTORY — DX: Unspecified osteoarthritis, unspecified site: M19.90

## 2015-08-17 HISTORY — DX: Gastro-esophageal reflux disease without esophagitis: K21.9

## 2015-08-17 HISTORY — DX: Hemiplegia and hemiparesis following cerebral infarction affecting left non-dominant side: I69.354

## 2015-08-17 HISTORY — DX: Contracture, left hand: M24.542

## 2015-08-17 LAB — POTASSIUM: POTASSIUM: 3.4 mmol/L — AB (ref 3.5–5.1)

## 2015-08-17 NOTE — Patient Instructions (Signed)
  Your procedure is scheduled on: August 25, 2015 (Friday) Report to Same Day Surgery 2nd floor [facility] To find out your arrival time please call [phone] between 1PM - 3PM on August 24, 2015 (Thursday)  Remember: Instructions that are not followed completely may result in serious medical risk, up to and including death, or upon the discretion of your surgeon and anesthesiologist your surgery may need to be rescheduled.    _x___ 1. Do not eat food or drink liquids after midnight. No gum chewing or hard candies.     _x___ 2. No Alcohol for 24 hours before or after surgery.   _x__3. No Smoking for 24 prior to surgery.   ____  4. Bring all medications with you on the day of surgery if instructed.    __x__ 5. Notify your doctor if there is any change in your medical condition     (cold, fever, infections).     Do not wear jewelry, make-up, hairpins, clips or nail polish.  Do not wear lotions, powders, or perfumes. You may wear deodorant.  Do not shave 48 hours prior to surgery. Men may shave face and neck.  Do not bring valuables to the hospital.    [facility] is not responsible for any belongings or valuables.               Contacts, dentures or bridgework may not be worn into surgery.  Leave your suitcase in the car. After surgery it may be brought to your room.  For patients admitted to the hospital, discharge time is determined by your treatment team.   Patients discharged the day of surgery will not be allowed to drive home.    Please read over the following fact sheets that you were given:   [facility] Preparing for Surgery and or MRSA Information   _x___ Take these medicines the morning of surgery with A SIP OF WATER:    1. AMLODIPINE  2. CARVEDILOL  3. GABAPENTIN  4. LOSARTAN  5. OMEPRAZOLE (OMEPRAZOLE AT BEDTIME ON AUGUST 17 ALSO)  6.  ____ Fleet Enema (as directed)   _x___ Use CHG Soap or sage wipes as directed on instruction sheet   _x___ Use inhalers  on the day of surgery and bring to hospital day of surgery (USE DUONEB NEBULIZER THE MORNING OF SURGERY PRIOR TO [place])  ____ Stop metformin 2 days prior to surgery    ____ Take 1/2 of usual insulin dose the night before surgery and none on the morning of surgery    _x___ Stop aspirin or coumadin, or plavix (Dr. Vickki Muff office called and states to [place] [facility], SUNDAY  _x__ Stop Anti-inflammatories such as Advil, Aleve, Ibuprofen, Motrin, Naproxen,          Naprosyn, Goodies powders or aspirin products. Ok to take Tylenol. (STOP ICY HOT AND ASPERCREME NOW UNTIL AFTER SURGERY)   ____ Stop supplements until after surgery.    ____ Bring C-Pap to the hospital.

## 2015-08-25 ENCOUNTER — Ambulatory Visit: Payer: PPO | Admitting: Anesthesiology

## 2015-08-25 ENCOUNTER — Encounter
Admission: RE | Disposition: A | Discharge status: Skilled Nursing Facility | Payer: Self-pay | Source: Ambulatory Visit | Attending: Podiatry

## 2015-08-25 ENCOUNTER — Ambulatory Visit
Admission: RE | Admit: 2015-08-25 | Discharge: 2015-08-25 | Disposition: A | Discharge status: Skilled Nursing Facility | Payer: PPO | Source: Ambulatory Visit | Attending: Podiatry | Admitting: Podiatry

## 2015-08-25 ENCOUNTER — Encounter: Payer: Self-pay | Admitting: *Deleted

## 2015-08-25 DIAGNOSIS — Z881 Allergy status to other antibiotic agents status: Secondary | ICD-10-CM | POA: Insufficient documentation

## 2015-08-25 DIAGNOSIS — E871 Hypo-osmolality and hyponatremia: Secondary | ICD-10-CM | POA: Insufficient documentation

## 2015-08-25 DIAGNOSIS — M216X2 Other acquired deformities of left foot: Secondary | ICD-10-CM | POA: Diagnosis not present

## 2015-08-25 DIAGNOSIS — Z7982 Long term (current) use of aspirin: Secondary | ICD-10-CM | POA: Diagnosis not present

## 2015-08-25 DIAGNOSIS — M21162 Varus deformity, not elsewhere classified, left knee: Secondary | ICD-10-CM | POA: Diagnosis not present

## 2015-08-25 DIAGNOSIS — Q660 Congenital talipes equinovarus: Secondary | ICD-10-CM | POA: Insufficient documentation

## 2015-08-25 DIAGNOSIS — Z87891 Personal history of nicotine dependence: Secondary | ICD-10-CM | POA: Insufficient documentation

## 2015-08-25 DIAGNOSIS — Z90710 Acquired absence of both cervix and uterus: Secondary | ICD-10-CM | POA: Insufficient documentation

## 2015-08-25 DIAGNOSIS — E7800 Pure hypercholesterolemia, unspecified: Secondary | ICD-10-CM | POA: Insufficient documentation

## 2015-08-25 DIAGNOSIS — I69354 Hemiplegia and hemiparesis following cerebral infarction affecting left non-dominant side: Secondary | ICD-10-CM | POA: Insufficient documentation

## 2015-08-25 DIAGNOSIS — Z79899 Other long term (current) drug therapy: Secondary | ICD-10-CM | POA: Insufficient documentation

## 2015-08-25 DIAGNOSIS — K219 Gastro-esophageal reflux disease without esophagitis: Secondary | ICD-10-CM | POA: Insufficient documentation

## 2015-08-25 DIAGNOSIS — Z888 Allergy status to other drugs, medicaments and biological substances status: Secondary | ICD-10-CM | POA: Diagnosis not present

## 2015-08-25 DIAGNOSIS — Z8249 Family history of ischemic heart disease and other diseases of the circulatory system: Secondary | ICD-10-CM | POA: Insufficient documentation

## 2015-08-25 DIAGNOSIS — Z833 Family history of diabetes mellitus: Secondary | ICD-10-CM | POA: Insufficient documentation

## 2015-08-25 DIAGNOSIS — R262 Difficulty in walking, not elsewhere classified: Secondary | ICD-10-CM | POA: Insufficient documentation

## 2015-08-25 DIAGNOSIS — G8918 Other acute postprocedural pain: Secondary | ICD-10-CM | POA: Diagnosis not present

## 2015-08-25 DIAGNOSIS — Z91040 Latex allergy status: Secondary | ICD-10-CM | POA: Insufficient documentation

## 2015-08-25 DIAGNOSIS — Z800 Family history of malignant neoplasm of digestive organs: Secondary | ICD-10-CM | POA: Diagnosis not present

## 2015-08-25 DIAGNOSIS — Z811 Family history of alcohol abuse and dependence: Secondary | ICD-10-CM | POA: Diagnosis not present

## 2015-08-25 DIAGNOSIS — R32 Unspecified urinary incontinence: Secondary | ICD-10-CM | POA: Diagnosis not present

## 2015-08-25 DIAGNOSIS — I739 Peripheral vascular disease, unspecified: Secondary | ICD-10-CM | POA: Diagnosis not present

## 2015-08-25 DIAGNOSIS — I10 Essential (primary) hypertension: Secondary | ICD-10-CM | POA: Insufficient documentation

## 2015-08-25 DIAGNOSIS — F418 Other specified anxiety disorders: Secondary | ICD-10-CM | POA: Insufficient documentation

## 2015-08-25 DIAGNOSIS — M79662 Pain in left lower leg: Secondary | ICD-10-CM | POA: Diagnosis not present

## 2015-08-25 HISTORY — PX: ACHILLES TENDON LENGTHENING: SHX6455

## 2015-08-25 HISTORY — PX: FLAT FOOT RECONSTRUCTION-TAL GASTROC RECESSION: SHX6620

## 2015-08-25 SURGERY — FLAT FOOT RECONSTRUCTION-TAL GASTROC RECESSION
Anesthesia: General | Laterality: Left

## 2015-08-25 MED ORDER — PROPOFOL 10 MG/ML IV BOLUS
INTRAVENOUS | Status: DC | PRN
  Administered 2015-08-25: 100 mg via INTRAVENOUS
  Administered 2015-08-25: 30 mg via INTRAVENOUS
  Administered 2015-08-25: 20 mg via INTRAVENOUS

## 2015-08-25 MED ORDER — ONDANSETRON HCL 4 MG/2ML IJ SOLN: 4.0000 mg | Freq: Once | INTRAMUSCULAR | Status: DC | PRN

## 2015-08-25 MED ORDER — SUCCINYLCHOLINE CHLORIDE 20 MG/ML IJ SOLN
INTRAMUSCULAR | Status: DC | PRN
  Administered 2015-08-25: 80 mg via INTRAVENOUS

## 2015-08-25 MED ORDER — PHENYLEPHRINE 8 MG IN D5W 100 ML (0.08MG/ML) PREMIX OPTIME
INJECTION | INTRAVENOUS | Status: DC | PRN
  Administered 2015-08-25: 25 ug/min via INTRAVENOUS

## 2015-08-25 MED ORDER — PHENYLEPHRINE HCL 10 MG/ML IJ SOLN
INTRAMUSCULAR | Status: DC | PRN
  Administered 2015-08-25 (×2): 200 ug via INTRAVENOUS

## 2015-08-25 MED ORDER — FENTANYL CITRATE (PF) 100 MCG/2ML IJ SOLN
INTRAMUSCULAR | Status: DC | PRN
  Administered 2015-08-25 (×2): 50 ug via INTRAVENOUS

## 2015-08-25 MED ORDER — OXYCODONE-ACETAMINOPHEN 5-325 MG PO TABS: 1.0000 | ORAL_TABLET | ORAL | 0 refills | Status: DC | PRN

## 2015-08-25 MED ORDER — METOPROLOL TARTRATE 5 MG/5ML IV SOLN
INTRAVENOUS | Status: AC
  Administered 2015-08-25: 5 mg via INTRAVENOUS
  Filled 2015-08-25: qty 5

## 2015-08-25 MED ORDER — LACTATED RINGERS IV SOLN
INTRAVENOUS | Status: DC
  Administered 2015-08-25: 08:00:00 via INTRAVENOUS

## 2015-08-25 MED ORDER — EPHEDRINE SULFATE 50 MG/ML IJ SOLN
INTRAMUSCULAR | Status: DC | PRN
  Administered 2015-08-25 (×2): 10 mg via INTRAVENOUS

## 2015-08-25 MED ORDER — CEFAZOLIN SODIUM-DEXTROSE 2-4 GM/100ML-% IV SOLN
INTRAVENOUS | Status: AC
  Filled 2015-08-25: qty 100

## 2015-08-25 MED ORDER — LIDOCAINE HCL (PF) 1 % IJ SOLN
INTRAMUSCULAR | Status: AC
  Filled 2015-08-25: qty 30

## 2015-08-25 MED ORDER — ROPIVACAINE HCL 5 MG/ML IJ SOLN
INTRAMUSCULAR | Status: AC
  Filled 2015-08-25: qty 40

## 2015-08-25 MED ORDER — ONDANSETRON HCL 4 MG/2ML IJ SOLN
INTRAMUSCULAR | Status: DC | PRN
  Administered 2015-08-25: 4 mg via INTRAVENOUS

## 2015-08-25 MED ORDER — CEFAZOLIN SODIUM-DEXTROSE 2-4 GM/100ML-% IV SOLN
2.0000 g | Freq: Once | INTRAVENOUS | Status: AC
  Administered 2015-08-25 (×2): 2 g via INTRAVENOUS

## 2015-08-25 MED ORDER — BUPIVACAINE HCL (PF) 0.5 % IJ SOLN
INTRAMUSCULAR | Status: AC
  Filled 2015-08-25: qty 30

## 2015-08-25 MED ORDER — LIDOCAINE-EPINEPHRINE 1 %-1:100000 IJ SOLN
INTRAMUSCULAR | Status: AC
  Filled 2015-08-25: qty 1

## 2015-08-25 MED ORDER — METOPROLOL TARTRATE 5 MG/5ML IV SOLN
5.0000 mg | Freq: Once | INTRAVENOUS | Status: AC
  Administered 2015-08-25: 5 mg via INTRAVENOUS

## 2015-08-25 MED ORDER — FENTANYL CITRATE (PF) 100 MCG/2ML IJ SOLN: 25.0000 ug | INTRAMUSCULAR | Status: DC | PRN

## 2015-08-25 SURGICAL SUPPLY — 53 items
APPLICATOR COTTON TIP 6IN STRL (MISCELLANEOUS) ×9 IMPLANT
BANDAGE ELASTIC 4 LF NS (GAUZE/BANDAGES/DRESSINGS) ×3 IMPLANT
BANDAGE STRETCH 3X4.1 STRL (GAUZE/BANDAGES/DRESSINGS) IMPLANT
BLADE MED AGGRESSIVE (BLADE) IMPLANT
BLADE OSCILLATING/SAGITTAL (BLADE)
BLADE SURG 15 STRL LF DISP TIS (BLADE) ×2 IMPLANT
BLADE SURG 15 STRL SS (BLADE) ×1
BLADE SURG MINI STRL (BLADE) ×3 IMPLANT
BLADE SW THK.38XMED LNG THN (BLADE) IMPLANT
BLADE TRIANGLE EPF/EGR ENDO (BLADE) ×3 IMPLANT
BNDG COHESIVE 4X5 TAN STRL (GAUZE/BANDAGES/DRESSINGS) ×3 IMPLANT
BNDG ESMARK 4X12 TAN STRL LF (GAUZE/BANDAGES/DRESSINGS) ×3 IMPLANT
BNDG GAUZE 4.5X4.1 6PLY STRL (MISCELLANEOUS) ×3 IMPLANT
BNDG STRETCH 4X75 STRL LF (GAUZE/BANDAGES/DRESSINGS) ×3 IMPLANT
BUR 4X55 1 (BURR) IMPLANT
CANISTER SUCT 1200ML W/VALVE (MISCELLANEOUS) ×3 IMPLANT
CATH TRAY METER 16FR LF (MISCELLANEOUS) IMPLANT
DRAPE FLUOR MINI C-ARM 54X84 (DRAPES) IMPLANT
DRSG TEGADERM 4X4.75 (GAUZE/BANDAGES/DRESSINGS) IMPLANT
DURAPREP 26ML APPLICATOR (WOUND CARE) ×3 IMPLANT
ELECT REM PT RETURN 9FT ADLT (ELECTROSURGICAL) ×3
ELECTRODE REM PT RTRN 9FT ADLT (ELECTROSURGICAL) ×2 IMPLANT
GAUZE PETRO XEROFOAM 1X8 (MISCELLANEOUS) IMPLANT
GAUZE SPONGE 4X4 12PLY STRL (GAUZE/BANDAGES/DRESSINGS) ×3 IMPLANT
GAUZE STRETCH 2X75IN STRL (MISCELLANEOUS) IMPLANT
GLOVE BIO SURGEON STRL SZ7.5 (GLOVE) IMPLANT
GLOVE INDICATOR 8.0 STRL GRN (GLOVE) IMPLANT
GLOVE SURG NONLX 6.5 ULT (GLOVE) ×3 IMPLANT
GLOVE SURG SYN 7.0 (GLOVE) ×3 IMPLANT
GOWN STRL REUS W/ TWL LRG LVL3 (GOWN DISPOSABLE) ×4 IMPLANT
GOWN STRL REUS W/TWL LRG LVL3 (GOWN DISPOSABLE) ×2
KIT RM TURNOVER STRD PROC AR (KITS) ×3 IMPLANT
KIT SHOULDER TRACTION (DRAPES) ×3 IMPLANT
LABEL OR SOLS (LABEL) ×3 IMPLANT
NDL SAFETY 18GX1.5 (NEEDLE) ×3 IMPLANT
NEEDLE FILTER BLUNT 18X 1/2SAF (NEEDLE) ×1
NEEDLE FILTER BLUNT 18X1 1/2 (NEEDLE) ×2 IMPLANT
NEEDLE HYPO 25X1 1.5 SAFETY (NEEDLE) ×3 IMPLANT
NS IRRIG 500ML POUR BTL (IV SOLUTION) ×3 IMPLANT
PACK EXTREMITY ARMC (MISCELLANEOUS) ×3 IMPLANT
PADDING CAST BLEND 4X4 NS (MISCELLANEOUS) IMPLANT
PENCIL ELECTRO HAND CTR (MISCELLANEOUS) IMPLANT
PUSH BLADE ×3 IMPLANT
STOCKINETTE M/LG 89821 (MISCELLANEOUS) IMPLANT
STRIP CLOSURE SKIN 1/4X4 (GAUZE/BANDAGES/DRESSINGS) IMPLANT
SUT ETHILON 3-0 (SUTURE) ×3 IMPLANT
SUT MNCRL+ 5-0 UNDYED PC-3 (SUTURE) ×2 IMPLANT
SUT MONOCRYL 5-0 (SUTURE) ×1
SUT VIC AB 4-0 FS2 27 (SUTURE) ×3 IMPLANT
SUT VICRYL AB 3-0 FS1 BRD 27IN (SUTURE) ×3 IMPLANT
SYR 3ML LL SCALE MARK (SYRINGE) ×3 IMPLANT
SYRINGE 10CC LL (SYRINGE) ×3 IMPLANT
WIRE MAGNUM (SUTURE) ×3 IMPLANT

## 2015-08-25 NOTE — Op Note (Signed)
Operative note   Surgeon:Oluwadamilola Deliz Lawyer: None     Preop diagnosis: Severe lower extremity equinus left lower extremity    Postop diagnosis: Same    Procedure:1.  Endoscopic gastroc soleal recession left lower leg  2. Percutaneous tendo Achilles lengthening left lower leg    EBL: Minimal    Anesthesia:regional and general with popliteal block    Hemostasis: Thigh tourniquet inflated to 250 mmHg for approximately 30 minutes    Specimen: None    Complications: None    Operative indications:[name] [address] [name] is an 75 y.o. that presents today for surgical intervention.  The risks/benefits/alternatives/complications have been discussed and consent has been given.    Procedure:  Patient was brought into the OR and placed on the operating table in thesupine position. After anesthesia was obtained the left lower extremity was prepped and draped in usual sterile fashion.  After sterile prep and drape and general anesthesia with popliteal block attention was directed to the posterior aspect of the left calf. After inflation of the tourniquet a small stab incision was made just distal to the calf muscle at the gastroc soleal junction. Blunt dissection carried down to the myotendinous junction at this time. Blunt dissection was then carried across to the lateral aspect. The blunt trocar and cannula was then introduced medially and a small stab incision was made laterally to further placed the trocar and cannula. The blunt trocar was then removed and the endoscope was then placed into the cannula. At this time the gastroesophageal junction was noted. Next with a diamond blade I was able to make a pass along the myotendinous junction and released the gastroc soleal contracture. The muscle was noted just deep to this area. The cannula was then rotated 90 and a small fiber was noted at this time and released as well. Good excursion was noted with markedly less contracture of the gastroc  soleal junction does still plantarflexion of the foot was noted.  At this time 3 percutaneous hemisections at 13 and 5 cm from the Achilles insertional sites were placed. 2 medial and one lateral stab hemisection was performed. Excellent excursion of the foot was noted and the foot was found to return to a 90 position. All wounds were flushed with copious amounts or irrigation. Closure was then performed with a 4-0 nylon. The foot was bandaged with a bulky sterile bandage and placed into an equalizer walker boot and held at 90 position throughout.    Patient tolerated the procedure and anesthesia well.  Was transported from the OR to the PACU with all vital signs stable and vascular status intact. To be discharged per routine protocol.  Will follow up in approximately 1 week in the outpatient clinic.

## 2015-08-25 NOTE — Anesthesia Preprocedure Evaluation (Addendum)
Anesthesia Evaluation  Patient identified by MRN, date of birth, ID band Patient awake    Reviewed: Allergy & Precautions, H&P , NPO status , Patient's Chart, lab work & pertinent test results, reviewed documented beta blocker date and time   History of Anesthesia Complications Negative for: history of anesthetic complications  Airway Mallampati: III  TM Distance: >3 FB Neck ROM: full    Dental no notable dental hx. (+) Teeth Intact, Caps, Implants   Pulmonary neg shortness of breath, asthma , neg sleep apnea, neg COPD, neg recent URI, former smoker,    Pulmonary exam normal breath sounds clear to auscultation       Cardiovascular Exercise Tolerance: Poor hypertension, On Medications (-) angina+ Peripheral Vascular Disease  (-) CAD, (-) Past MI, (-) Cardiac Stents and (-) CABG Normal cardiovascular exam(-) dysrhythmias (-) Valvular Problems/Murmurs Rhythm:regular Rate:Normal     Neuro/Psych neg Seizures PSYCHIATRIC DISORDERS (Depression and anxiety) TIA Neuromuscular disease CVA, Residual Symptoms    GI/Hepatic Neg liver ROS, GERD  Medicated,  Endo/Other  negative endocrine ROS  Renal/GU negative Renal ROS  negative genitourinary   Musculoskeletal   Abdominal   Peds  Hematology negative hematology ROS (+)   Anesthesia Other Findings Past Medical History: No date: Anxiety No date: Arthritis No date: Asthma No date: Contracture of joint of finger of left hand No date: Depression No date: GERD (gastroesophageal reflux disease) No date: Hemiparesis affecting left side as late effect* No date: Hyperlipidemia No date: Hypertension No date: Osteoarthritis No date: Stroke ([place]) No date: TIA (transient ischemic attack)   Reproductive/Obstetrics negative OB ROS                             Anesthesia Physical Anesthesia Plan  ASA: III  Anesthesia Plan: General and Rapid Sequence    Post-op Pain Management: GA combined w/ Regional for post-op pain   Induction:   Airway Management Planned:   Additional Equipment:   Intra-op Plan:   Post-operative Plan:   Informed Consent: I have reviewed the patients History and Physical, chart, labs and discussed the procedure including the risks, benefits and alternatives for the proposed anesthesia with the patient or authorized representative who has indicated his/her understanding and acceptance.   Dental Advisory Given  Plan Discussed with: Anesthesiologist, CRNA and Surgeon  Anesthesia Plan Comments:        Anesthesia Quick Evaluation

## 2015-08-25 NOTE — H&P (Signed)
HISTORY AND PHYSICAL INTERVAL NOTE:  08/25/2015  8:10 AM  [name]  has presented today for surgery, with the diagnosis of acquired equinus deformity left foot.  The various methods of treatment have been discussed with the patient.  No guarantees were given.  After consideration of risks, benefits and other options for treatment, the patient has consented to surgery.  I have reviewed the patients' chart and labs.    Patient Vitals for the past 24 hrs:  BP Temp Temp src Pulse Resp SpO2 Height Weight  08/25/15 0757 (!) 170/83 97.4 F (36.3 C) Tympanic 84 18 96 % 5' (1.524 m) 75.8 kg (167 lb)    A history and physical examination was performed in my office.  The patient was reexamined.  There have been no changes to this history and physical examination.  Samara Deist A

## 2015-08-25 NOTE — Transfer of Care (Signed)
Immediate Anesthesia Transfer of Care Note  Patient: [name]  Procedure(s) Performed: Procedure(s): ENDOSCOPIC FLAT FOOT RECONSTRUCTION-TAL GASTROC RECESSION (Left) ACHILLES TENDON LENGTHENING  Patient Location: PACU  Anesthesia Type:General and Regional  Level of Consciousness: awake and alert   Airway & Oxygen Therapy: Patient Spontanous Breathing and Patient connected to face mask oxygen  Post-op Assessment: Report given to RN and Post -op Vital signs reviewed and stable  Post vital signs: Reviewed and stable  Last Vitals:  Vitals:   08/25/15 0757 08/25/15 1042  BP: (!) 170/83 (!) 171/107  Pulse: 84 (!) 131  Resp: 18 17  Temp: 36.3 C 36.2 C    Last Pain:  Vitals:   08/25/15 1042  TempSrc: Tympanic         Complications: No apparent anesthesia complications

## 2015-08-25 NOTE — Discharge Instructions (Signed)
[facility] [address] [place]   1. Take your medication as prescribed.  Pain medication should be taken only as needed.  2. Keep the dressing clean, dry and intact.  Keep boot on at all times until next post-op visit  3. Keep your foot elevated above the heart level for the first 48 hours.  4. Walking to the bathroom and brief periods of walking are acceptable, unless we have instructed you to be non-weight bearing.  5. Always wear your post-op shoe when walking.  Always use your crutches if you are to be non-weight bearing.  6. Do not take a shower. Baths are permissible as long as the foot is kept out of the water.   7. Every hour you are awake:  - Bend your knee 15 times.   8. Call [facility] [phone]) if any of the following problems occur: - You develop a temperature or fever. - The bandage becomes saturated with blood. - Medication does not stop your pain. - Injury of the foot occurs. - Any symptoms of infection including redness, odor, or red streaks running from wound.      -  -  -  AMBULATORY SURGERY  DISCHARGE INSTRUCTIONS   The drugs that you were given will stay in your system until tomorrow so for the next 24 hours you should not:  Drive an automobile Make any legal decisions Drink any alcoholic beverage   You may resume regular meals tomorrow.  Today it is better to start with liquids and gradually work up to solid foods.  You may eat anything you prefer, but it is better to start with liquids, then soup and crackers, and gradually work up to solid foods.   Please notify your doctor immediately if you have any unusual bleeding, trouble breathing, redness and pain at the surgery site, drainage, fever, or pain not relieved by medication.    Additional Instructions:        - Please contact your physician with any  problems or Same Day Surgery at [phone], Monday through Friday 6 am to 4 pm, or [place] at [facility] number at [phone].

## 2015-08-25 NOTE — Anesthesia Procedure Notes (Signed)
Procedure Name: LMA Insertion Date/Time: 08/25/2015 9:45 AM Performed by: Martha Clan Pre-anesthesia Checklist: Patient identified, Emergency Drugs available, Suction available, Patient being monitored and Timeout performed Patient Re-evaluated:Patient Re-evaluated prior to inductionOxygen Delivery Method: Circle system utilized Preoxygenation: Pre-oxygenation with 100% oxygen Intubation Type: IV induction Ventilation: Mask ventilation without difficulty LMA: LMA inserted LMA Size: 4.0 Number of attempts: 1

## 2015-08-25 NOTE — Anesthesia Postprocedure Evaluation (Signed)
Anesthesia Post Note  Patient: [name]  Procedure(s) Performed: Procedure(s) (LRB): ENDOSCOPIC FLAT FOOT RECONSTRUCTION-TAL GASTROC RECESSION (Left) ACHILLES TENDON LENGTHENING  Patient location during evaluation: PACU Anesthesia Type: General and Regional Level of consciousness: awake and alert Pain management: pain level controlled Vital Signs Assessment: post-procedure vital signs reviewed and stable Respiratory status: spontaneous breathing, nonlabored ventilation, respiratory function stable and patient connected to nasal cannula oxygen Cardiovascular status: blood pressure returned to baseline and stable Postop Assessment: no signs of nausea or vomiting Anesthetic complications: no    Last Vitals:  Vitals:   08/25/15 1127 08/25/15 1140  BP: (!) 148/86 (!) 147/90  Pulse: 92   Resp: 20 18  Temp: 36.3 C (!) 35.8 C    Last Pain:  Vitals:   08/25/15 1140  TempSrc: Tympanic                 Martha Clan

## 2015-08-25 NOTE — Anesthesia Procedure Notes (Signed)
Anesthesia Regional Block:  Popliteal block  Pre-Anesthetic Checklist: ,, timeout performed, Correct Patient, Correct Site, Correct Laterality, Correct Procedure, Correct Position, site marked, Risks and benefits discussed,  Surgical consent,  Pre-op evaluation,  At surgeon's request and post-op pain management  Laterality: Left and Lower  Prep: chloraprep       Needles:  Injection technique: Single-shot  Needle Type: Echogenic Needle     Needle Length: 9cm 9 cm Needle Gauge: 21 and 21 G    Additional Needles:  Procedures: ultrasound guided (picture in chart) Popliteal block Narrative:  Start time: 08/25/2015 9:32 AM End time: 08/25/2015 9:36 AM Injection made incrementally with aspirations every 5 mL.  Performed by: Personally  Anesthesiologist: Martha Clan  Additional Notes: Functioning IV was confirmed and monitors were applied.  A echogenic needle was used. Sterile prep,hand hygiene and sterile gloves were used.  Negative aspiration and negative test dose prior to incremental administration of local anesthetic. The patient tolerated the procedure well with no immediate complications.

## 2015-08-30 ENCOUNTER — Non-Acute Institutional Stay (SKILLED_NURSING_FACILITY): Payer: PPO | Admitting: Gerontology

## 2015-08-30 DIAGNOSIS — F329 Major depressive disorder, single episode, unspecified: Secondary | ICD-10-CM | POA: Diagnosis not present

## 2015-08-30 DIAGNOSIS — J449 Chronic obstructive pulmonary disease, unspecified: Secondary | ICD-10-CM | POA: Diagnosis not present

## 2015-08-30 DIAGNOSIS — I6350 Cerebral infarction due to unspecified occlusion or stenosis of unspecified cerebral artery: Secondary | ICD-10-CM | POA: Diagnosis not present

## 2015-08-30 DIAGNOSIS — M216X2 Other acquired deformities of left foot: Secondary | ICD-10-CM

## 2015-08-30 DIAGNOSIS — I10 Essential (primary) hypertension: Secondary | ICD-10-CM | POA: Diagnosis not present

## 2015-08-31 DIAGNOSIS — M216X2 Other acquired deformities of left foot: Secondary | ICD-10-CM | POA: Insufficient documentation

## 2015-08-31 NOTE — Progress Notes (Signed)
Location:      Place of Service:  SNF (31) Provider:  Toni Arthurs, NP-C  [name], MD  Patient Care Team: [name], MD as PCP - General (Family Medicine)  Extended Emergency Contact Information Primary Emergency Contact: Norm Salt Address: [address]          [name] Phone: [phone] Mobile Phone: [phone] Relation: Spouse Secondary Emergency Contact: Barry Dienes, [place] [address] Johnnette Litter of [place] Phone: [phone] Mobile Phone: [phone] Relation: Daughter  Code Status:  Full Goals of care: Advanced Directive information Advanced Directives 08/25/2015  Does patient have an advance directive? Yes  Type of Paramedic of [place];Living will  Does patient want to make changes to advanced directive? No - Patient declined  Copy of advanced directive(s) in chart? No - copy requested  Would patient like information on creating an advanced directive? -     Chief Complaint  Patient presents with  . Medication Management    HPI:  Pt is a 75 y.o. female seen today for Medication Management of controlled substances used to control chroinc pain as a result of Acquired Equinus Deformity of the Left Foot . The patient describes their pain as fairly well controlled on current regimen. Average pain score is 8/10. Other modalities used to control symptoms include tramadol, pt/ot, surgical consult, gabapentin, effexor . Pt has tried tramadol in the past with unsatisfactory or incomplete relief of symptoms. Past Medical History:  Diagnosis Date  . Anxiety   . Arthritis   . Asthma   . 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 Depression   . GERD (gastroesophageal reflux disease)   . Hemiparesis affecting left side as late effect of cerebrovascular accident (CVA) ([place])   . Hyperlipidemia   . Hypertension   . Osteoarthritis   . Stroke ([place])   .  TIA (transient ischemic attack)    Past Surgical History:  Procedure Laterality Date  . ABDOMINAL HYSTERECTOMY    . ACHILLES TENDON LENGTHENING  08/25/2015   Procedure: ACHILLES TENDON LENGTHENING;  Surgeon: Samara Deist, DPM;  Location: [facility] ORS;  Service: Podiatry;;  . APPENDECTOMY    . EYE SURGERY Bilateral    Cataract Extraction with IOL  . FLAT FOOT RECONSTRUCTION-TAL GASTROC RECESSION Left 08/25/2015   Procedure: ENDOSCOPIC FLAT FOOT RECONSTRUCTION-TAL GASTROC RECESSION;  Surgeon: Samara Deist, DPM;  Location: [facility] ORS;  Service: Podiatry;  Laterality: Left;    Allergies  Allergen Reactions  . Ace Inhibitors Cough  . Latex Hives  . Lisinopril Cough  . Tetracycline Other (See Comments)    Reaction:  Unknown   . Zanaflex [Tizanidine Hcl] Other (See Comments)    "drowsy"      Medication List       Accurate as of 08/30/15 11:59 PM. Always use your most recent med list.          acetaminophen 500 MG tablet Commonly known as:  TYLENOL Take 500 mg by mouth every 8 (eight) hours as needed for mild pain.   amLODipine 5 MG tablet Commonly known as:  NORVASC Take 5 mg by mouth daily.   CALCIUM 600+D 600-400 MG-UNIT tablet Generic drug:  Calcium Carbonate-Vitamin D Take 1 tablet by mouth 2 (two) times daily.   carvedilol 3.125 MG tablet Commonly known as:  COREG Take 3.125 mg by mouth 2 (two) times daily.   dipyridamole-aspirin 200-25 MG  12hr capsule Commonly known as:  AGGRENOX Take 1 capsule by mouth 2 (two) times daily.   gabapentin 300 MG capsule Commonly known as:  NEURONTIN Take 300 mg by mouth 3 (three) times daily.   hydrochlorothiazide 25 MG tablet Commonly known as:  HYDRODIURIL Take 25 mg by mouth daily.   ICY HOT EXTRA STRENGTH 10-30 % Crea Apply 1 application topically 3 (three) times daily as needed (pain in left shoulder and bicep).   ipratropium-albuterol 0.5-2.5 (3) MG/3ML Soln Commonly known as:  DUONEB Take 3 mLs by nebulization every 6  (six) hours as needed.   LORazepam 0.5 MG tablet Commonly known as:  ATIVAN Take 0.5 mg by mouth 2 (two) times daily as needed for anxiety.   losartan 50 MG tablet Commonly known as:  COZAAR Take 50 mg by mouth 2 (two) times daily.   lovastatin 20 MG tablet Commonly known as:  MEVACOR Take 20 mg by mouth at bedtime.   montelukast 10 MG tablet Commonly known as:  SINGULAIR Take 10 mg by mouth at bedtime.   MUCINEX MAXIMUM STRENGTH 1200 MG Tb12 Generic drug:  Guaifenesin Take 1,200 mg by mouth 2 (two) times daily.   omeprazole 20 MG capsule Commonly known as:  PRILOSEC Take 20 mg by mouth daily.   oxybutynin 5 MG tablet Commonly known as:  DITROPAN Take 5 mg by mouth 2 (two) times daily.   oxyCODONE-acetaminophen 5-325 MG tablet Commonly known as:  ROXICET Take 1-2 tablets by mouth every 4 (four) hours as needed for severe pain.   traMADol 50 MG tablet Commonly known as:  ULTRAM Take 1 tablet (50 mg total) by mouth every 8 (eight) hours as needed for moderate pain.   trolamine salicylate 10 % cream Commonly known as:  ASPERCREME Apply 1 application topically 3 (three) times daily as needed for muscle pain.   venlafaxine XR 75 MG 24 hr capsule Commonly known as:  EFFEXOR-XR Take 75 mg by mouth daily with breakfast.       Review of Systems  Respiratory: Negative for apnea, chest tightness and shortness of breath.   Cardiovascular: Negative for chest pain and palpitations.  Gastrointestinal: Negative for abdominal pain and constipation.  Musculoskeletal: Positive for arthralgias.  Neurological: Negative for dizziness, syncope, weakness and light-headedness.  Psychiatric/Behavioral: Negative for confusion and hallucinations.  All other systems reviewed and are negative.   Immunization History  Administered Date(s) Administered  . Influenza Whole 01/08/2004, 10/08/2006, 11/22/2008  . Pneumococcal Polysaccharide-23 10/24/2000, 11/30/2014  . Td 01/07/2001  .  Zoster 01/07/2006   Pertinent  Health Maintenance Due  Topic Date Due  . COLONOSCOPY  11/20/1989  . DEXA SCAN  11/20/2004  . INFLUENZA VACCINE  08/08/2015  . PNA vac Low Risk Adult (2 of 2 - PCV13) 11/30/2015   No flowsheet data found. Functional Status Survey:    Vitals:   08/30/15 0500  BP: 129/69  Pulse: 78  Resp: 16  Temp: 97.9 F (36.6 C)  SpO2: 95%   There is no height or weight on file to calculate BMI. Physical Exam  Constitutional: She is oriented to person, place, and time. She appears well-developed and well-nourished. No distress.  Eyes: Conjunctivae and EOM are normal. Pupils are equal, round, and reactive to light. Right eye exhibits no discharge. Left eye exhibits no discharge.  Neck: No JVD present.  Cardiovascular: Normal rate, regular rhythm and normal heart sounds.  Exam reveals no gallop and no friction rub.   No murmur heard. Pulmonary/Chest: Effort  normal. No respiratory distress. She has no wheezes. She has no rales. She exhibits no tenderness.  Abdominal: Soft. Bowel sounds are normal. She exhibits no distension. There is no tenderness. There is no guarding.  Musculoskeletal: She exhibits no edema or deformity.  Neurological: She is alert and oriented to person, place, and time.  Skin: Skin is warm and dry. No rash noted. She is not diaphoretic. No erythema. No pallor.  Psychiatric: She has a normal mood and affect. Her behavior is normal. Judgment and thought content normal.    Labs reviewed:  Recent Labs  11/21/14 1310  11/29/14 0830 11/29/14 1403 11/30/14 0504 07/25/15 0841 08/17/15 1036  NA  --   < >  --  127* 130* 134*  --   K 3.3*  < >  --  4.1 4.3 3.4* 3.4*  CL  --   < >  --  92* 96* 96*  --   CO2  --   < >  --  29 27 33*  --   GLUCOSE  --   < >  --  108* 86 89  --   BUN  --   < >  --  26* 24* 10  --   CREATININE  --   < >  --  0.64 0.54 0.51  --   CALCIUM  --   < >  --  9.8 9.9 9.9  --   MG 1.9  --  1.6*  --   --  1.7  --   < > =  values in this interval not displayed.  Recent Labs  11/25/14 1356 11/29/14 0438 07/25/15 0841  AST 24 19 20   ALT 25 27 24   ALKPHOS 31* 34* 69  BILITOT 0.6 0.9 0.4  PROT 5.7* 5.3* 6.6  ALBUMIN 3.2* 3.0* 3.7    Recent Labs  11/18/14 1821  11/25/14 1356  11/29/14 0438 11/30/14 0504 07/25/15 0841  WBC 7.0  < > 16.0*  < > 12.4* 14.5* 4.7  NEUTROABS 4.5  --  12.0*  --   --   --  2.8  HGB 13.0  < > 8.9*  < > 7.4* 11.3* 13.0  HCT 37.2  < > 26.2*  < > 21.2* 33.6* 37.2  MCV 90.9  < > 91.2  < > 91.7 86.2 89.9  PLT 220  < > 254  < > 233 253 212  < > = values in this interval not displayed. Lab Results  Component Value Date   TSH 4.585 (H) 07/25/2015   Lab Results  Component Value Date   HGBA1C 5.6 06/23/2009   Lab Results  Component Value Date   CHOL 201 (H) 07/25/2015   HDL 41 07/25/2015   LDLCALC 138 (H) 07/25/2015   LDLDIRECT 161.4 12/14/2008   TRIG 108 07/25/2015   CHOLHDL 4.9 07/25/2015    Significant Diagnostic Results in last 30 days:  No results found.  Assessment/Plan 1. Acquired equinus deformity of left foot  Continue complementary therapies including:  PT/OT  Restorative Nursing  Ice pack to site QID and prn  Diversional activities  Repositioning Q2 hours and prn   Prescription written and forwarded to pharmacy for:  Percocet 5/325 mg 1-2 tablets PO Q 4 hours prn pain; 1-moderate, 2-severe #120, no refills  Family/ staff Communication:   Total Time:  Documentation:  Face to Face:  Family/Phone:   Labs/tests ordered: NONE  Medication list reviewed and assessed for continued appropriateness. It is my medical opinion discontinuation  of medications would result in decompensation and decreased quality of life.   Vikki Ports, NP-C Geriatrics [facility] [phone], [place] [address] Cell Phone (Mon-Fri 8am-5pm):  [phone] On Call:  [phone] & follow prompts after 5pm &  weekends Office Phone:  [phone] Office Fax:  [phone]

## 2015-09-04 DIAGNOSIS — F419 Anxiety disorder, unspecified: Secondary | ICD-10-CM | POA: Diagnosis not present

## 2015-09-04 DIAGNOSIS — F329 Major depressive disorder, single episode, unspecified: Secondary | ICD-10-CM | POA: Diagnosis not present

## 2015-09-06 DIAGNOSIS — M216X2 Other acquired deformities of left foot: Secondary | ICD-10-CM | POA: Diagnosis not present

## 2015-09-06 DIAGNOSIS — M6702 Short Achilles tendon (acquired), left ankle: Secondary | ICD-10-CM | POA: Diagnosis not present

## 2015-09-08 ENCOUNTER — Encounter
Admission: RE | Admit: 2015-09-08 | Discharge: 2015-09-08 | Disposition: A | Discharge status: Home / Self Care | Payer: PPO | Source: Ambulatory Visit | Attending: Internal Medicine | Admitting: Internal Medicine

## 2015-09-22 DIAGNOSIS — R278 Other lack of coordination: Secondary | ICD-10-CM | POA: Diagnosis not present

## 2015-09-22 DIAGNOSIS — R293 Abnormal posture: Secondary | ICD-10-CM | POA: Diagnosis not present

## 2015-09-22 DIAGNOSIS — I69354 Hemiplegia and hemiparesis following cerebral infarction affecting left non-dominant side: Secondary | ICD-10-CM | POA: Diagnosis not present

## 2015-09-22 DIAGNOSIS — I69398 Other sequelae of cerebral infarction: Secondary | ICD-10-CM | POA: Diagnosis not present

## 2015-09-22 DIAGNOSIS — M6281 Muscle weakness (generalized): Secondary | ICD-10-CM | POA: Diagnosis not present

## 2015-10-03 DIAGNOSIS — M79671 Pain in right foot: Secondary | ICD-10-CM | POA: Diagnosis not present

## 2015-10-03 DIAGNOSIS — M25571 Pain in right ankle and joints of right foot: Secondary | ICD-10-CM | POA: Diagnosis not present

## 2015-10-08 ENCOUNTER — Encounter
Admission: RE | Admit: 2015-10-08 | Discharge: 2015-10-08 | Disposition: A | Discharge status: Home / Self Care | Payer: PPO | Source: Ambulatory Visit | Attending: Internal Medicine | Admitting: Internal Medicine

## 2015-10-09 DIAGNOSIS — R293 Abnormal posture: Secondary | ICD-10-CM | POA: Diagnosis not present

## 2015-10-09 DIAGNOSIS — I69354 Hemiplegia and hemiparesis following cerebral infarction affecting left non-dominant side: Secondary | ICD-10-CM | POA: Diagnosis not present

## 2015-10-09 DIAGNOSIS — M6281 Muscle weakness (generalized): Secondary | ICD-10-CM | POA: Diagnosis not present

## 2015-10-09 DIAGNOSIS — R278 Other lack of coordination: Secondary | ICD-10-CM | POA: Diagnosis not present

## 2015-10-09 DIAGNOSIS — I69398 Other sequelae of cerebral infarction: Secondary | ICD-10-CM | POA: Diagnosis not present

## 2015-11-08 ENCOUNTER — Encounter
Admission: RE | Admit: 2015-11-08 | Discharge: 2015-11-08 | Disposition: A | Discharge status: Home / Self Care | Payer: PPO | Source: Ambulatory Visit | Attending: Internal Medicine | Admitting: Internal Medicine

## 2015-11-08 DIAGNOSIS — I69398 Other sequelae of cerebral infarction: Secondary | ICD-10-CM | POA: Diagnosis not present

## 2015-11-08 DIAGNOSIS — R05 Cough: Secondary | ICD-10-CM | POA: Insufficient documentation

## 2015-11-08 DIAGNOSIS — R278 Other lack of coordination: Secondary | ICD-10-CM | POA: Diagnosis not present

## 2015-11-08 DIAGNOSIS — R509 Fever, unspecified: Secondary | ICD-10-CM | POA: Insufficient documentation

## 2015-11-08 DIAGNOSIS — R293 Abnormal posture: Secondary | ICD-10-CM | POA: Diagnosis not present

## 2015-11-08 DIAGNOSIS — I69354 Hemiplegia and hemiparesis following cerebral infarction affecting left non-dominant side: Secondary | ICD-10-CM | POA: Diagnosis not present

## 2015-11-08 DIAGNOSIS — M6281 Muscle weakness (generalized): Secondary | ICD-10-CM | POA: Diagnosis not present

## 2015-11-13 ENCOUNTER — Non-Acute Institutional Stay (SKILLED_NURSING_FACILITY): Payer: PPO | Admitting: Gerontology

## 2015-11-13 DIAGNOSIS — J209 Acute bronchitis, unspecified: Secondary | ICD-10-CM | POA: Diagnosis not present

## 2015-11-13 DIAGNOSIS — R05 Cough: Secondary | ICD-10-CM | POA: Diagnosis not present

## 2015-11-13 DIAGNOSIS — R509 Fever, unspecified: Secondary | ICD-10-CM | POA: Diagnosis not present

## 2015-11-13 LAB — RAPID INFLUENZA A&B ANTIGENS
Influenza A (ARMC): NEGATIVE
Influenza B (ARMC): NEGATIVE

## 2015-11-16 DIAGNOSIS — M24572 Contracture, left ankle: Secondary | ICD-10-CM | POA: Diagnosis not present

## 2015-11-16 DIAGNOSIS — M24575 Contracture, left foot: Secondary | ICD-10-CM | POA: Diagnosis not present

## 2015-11-22 DIAGNOSIS — F329 Major depressive disorder, single episode, unspecified: Secondary | ICD-10-CM | POA: Diagnosis not present

## 2015-11-22 DIAGNOSIS — F419 Anxiety disorder, unspecified: Secondary | ICD-10-CM | POA: Diagnosis not present

## 2015-11-25 NOTE — Progress Notes (Signed)
Location:      Place of Service:  SNF (31) Provider:  Toni Arthurs, NP-C  [name], MD  Patient Care Team: [name], MD as PCP - General (Family Medicine)  Extended Emergency Contact Information Primary Emergency Contact: Norm Salt Address: [place]          [name] Phone: [phone] Mobile Phone: [phone] Relation: Spouse Secondary Emergency Contact: Barry Dienes, [place] [address] Johnnette Litter of [place] Phone: [phone] Mobile Phone: [phone] Relation: Daughter  Code Status:  Full  Goals of care: Advanced Directive information Advanced Directives 08/25/2015  Does patient have an advance directive? Yes  Type of Paramedic of [place];Living will  Does patient want to make changes to advanced directive? No - Patient declined  Copy of advanced directive(s) in chart? No - copy requested  Would patient like information on creating an advanced directive? -     Chief Complaint  Patient presents with  . Acute Visit    HPI:  Pt is a 75 y.o. female seen today for an acute visit for cough, congestion. Pt is worried she may have Pneumonia. Pt reports she has had a cough for several days, non-productive. Low grade fever. She does complain of sinus congestion, maxillary sinus pressure, feeling of post nasal drip. Malaise. General achyness. Headache. No n/v/d/chills/cp/sob/abd pain/dizziness. Pt has had the Flumovax and has also received the Flu vaccine this year. No other complaints. VSS   Past Medical History:  Diagnosis Date  . Anxiety   . Arthritis   . Asthma   . 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 Depression   . GERD (gastroesophageal reflux disease)   . Hemiparesis affecting left side as late effect of cerebrovascular accident (CVA) ([place])   . Hyperlipidemia   . Hypertension   . Osteoarthritis   . Stroke ([place])   . TIA (transient  ischemic attack)    Past Surgical History:  Procedure Laterality Date  . ABDOMINAL HYSTERECTOMY    . ACHILLES TENDON LENGTHENING  08/25/2015   Procedure: ACHILLES TENDON LENGTHENING;  Surgeon: Samara Deist, DPM;  Location: [facility] ORS;  Service: Podiatry;;  . APPENDECTOMY    . EYE SURGERY Bilateral    Cataract Extraction with IOL  . FLAT FOOT RECONSTRUCTION-TAL GASTROC RECESSION Left 08/25/2015   Procedure: ENDOSCOPIC FLAT FOOT RECONSTRUCTION-TAL GASTROC RECESSION;  Surgeon: Samara Deist, DPM;  Location: [facility] ORS;  Service: Podiatry;  Laterality: Left;    Allergies  Allergen Reactions  . Ace Inhibitors Cough  . Latex Hives  . Lisinopril Cough  . Tetracycline Other (See Comments)    Reaction:  Unknown   . Zanaflex [Tizanidine Hcl] Other (See Comments)    "drowsy"      Medication List       Accurate as of 11/13/15 11:59 PM. Always use your most recent med list.          acetaminophen 500 MG tablet Commonly known as:  TYLENOL Take 500 mg by mouth every 8 (eight) hours as needed for mild pain.   amLODipine 5 MG tablet Commonly known as:  NORVASC Take 5 mg by mouth daily.   CALCIUM 600+D 600-400 MG-UNIT tablet Generic drug:  Calcium Carbonate-Vitamin D Take 1 tablet by mouth 2 (two) times daily.   carvedilol 3.125 MG tablet Commonly known as:  COREG Take 3.125 mg by mouth 2 (two) times daily.   dipyridamole-aspirin  200-25 MG 12hr capsule Commonly known as:  AGGRENOX Take 1 capsule by mouth 2 (two) times daily.   gabapentin 300 MG capsule Commonly known as:  NEURONTIN Take 300 mg by mouth 3 (three) times daily.   hydrochlorothiazide 25 MG tablet Commonly known as:  HYDRODIURIL Take 25 mg by mouth daily.   ICY HOT EXTRA STRENGTH 10-30 % Crea Apply 1 application topically 3 (three) times daily as needed (pain in left shoulder and bicep).   ipratropium-albuterol 0.5-2.5 (3) MG/3ML Soln Commonly known as:  DUONEB Take 3 mLs by nebulization every 6 (six) hours as  needed.   LORazepam 0.5 MG tablet Commonly known as:  ATIVAN Take 0.5 mg by mouth 2 (two) times daily as needed for anxiety.   losartan 50 MG tablet Commonly known as:  COZAAR Take 50 mg by mouth 2 (two) times daily.   lovastatin 20 MG tablet Commonly known as:  MEVACOR Take 20 mg by mouth at bedtime.   montelukast 10 MG tablet Commonly known as:  SINGULAIR Take 10 mg by mouth at bedtime.   MUCINEX MAXIMUM STRENGTH 1200 MG Tb12 Generic drug:  Guaifenesin Take 1,200 mg by mouth 2 (two) times daily.   omeprazole 20 MG capsule Commonly known as:  PRILOSEC Take 20 mg by mouth daily.   oxybutynin 5 MG tablet Commonly known as:  DITROPAN Take 5 mg by mouth 2 (two) times daily.   oxyCODONE-acetaminophen 5-325 MG tablet Commonly known as:  ROXICET Take 1-2 tablets by mouth every 4 (four) hours as needed for severe pain.   traMADol 50 MG tablet Commonly known as:  ULTRAM Take 1 tablet (50 mg total) by mouth every 8 (eight) hours as needed for moderate pain.   trolamine salicylate 10 % cream Commonly known as:  ASPERCREME Apply 1 application topically 3 (three) times daily as needed for muscle pain.   venlafaxine XR 75 MG 24 hr capsule Commonly known as:  EFFEXOR-XR Take 75 mg by mouth daily with breakfast.       Review of Systems  Constitutional: Positive for fatigue and fever (low grade). Negative for activity change, appetite change, chills and diaphoresis.  HENT: Positive for congestion, facial swelling, postnasal drip, rhinorrhea, sinus pain and sinus pressure. Negative for ear pain, sore throat, tinnitus, trouble swallowing and voice change.   Eyes: Negative for pain, discharge, redness and itching.  Respiratory: Positive for cough. Negative for apnea, chest tightness, shortness of breath and wheezing.   Cardiovascular: Negative for chest pain and palpitations.  Gastrointestinal: Negative for abdominal distention, abdominal pain, constipation, diarrhea, nausea and  vomiting.  Endocrine: Negative.   Genitourinary: Negative.   Musculoskeletal: Positive for arthralgias.  Skin: Negative.   Neurological: Positive for headaches. Negative for dizziness, tremors, syncope, facial asymmetry, speech difficulty, weakness, light-headedness and numbness.  Hematological: Negative.   Psychiatric/Behavioral: Negative.  Negative for confusion and hallucinations.  All other systems reviewed and are negative.   Immunization History  Administered Date(s) Administered  . Influenza Whole 01/08/2004, 10/08/2006, 11/22/2008  . Pneumococcal Polysaccharide-23 10/24/2000, 11/30/2014  . Td 01/07/2001  . Zoster 01/07/2006   Pertinent  Health Maintenance Due  Topic Date Due  . DEXA SCAN  11/20/2004  . INFLUENZA VACCINE  08/08/2015  . PNA vac Low Risk Adult (2 of 2 - PCV13) 11/30/2015   No flowsheet data found. Functional Status Survey:    Vitals:   11/13/15 0945  Pulse: 79  Temp: 98.9 F (37.2 C)   There is no height or weight on  file to calculate BMI. Physical Exam  Constitutional: She is oriented to person, place, and time. She appears well-developed and well-nourished. No distress.  Eyes: Conjunctivae and EOM are normal. Pupils are equal, round, and reactive to light. Right eye exhibits no discharge. Left eye exhibits no discharge.  Neck: Trachea normal and full passive range of motion without pain. Neck supple. No JVD present. Normal range of motion present. No thyroid mass and no thyromegaly present.  Cardiovascular: Normal rate, regular rhythm, normal heart sounds and normal pulses.  Exam reveals no gallop, no distant heart sounds and no friction rub.   No murmur heard. Pulmonary/Chest: Effort normal and breath sounds normal. No respiratory distress. She has no decreased breath sounds. She has no wheezes. She has no rhonchi. She has no rales. She exhibits no tenderness.  Abdominal: Soft. Normal appearance and bowel sounds are normal. She exhibits no  distension. There is no tenderness. There is no guarding.  Musculoskeletal: She exhibits no edema or deformity.  Lymphadenopathy:       Head (right side): No submental, no submandibular and no tonsillar adenopathy present.       Head (left side): No submental, no submandibular and no tonsillar adenopathy present.       Right cervical: No superficial cervical adenopathy present.      Left cervical: No superficial cervical adenopathy present.  Neurological: She is alert and oriented to person, place, and time.  Skin: Skin is warm, dry and intact. No rash noted. She is not diaphoretic. No cyanosis or erythema. No pallor. Nails show no clubbing.  Psychiatric: She has a normal mood and affect. Her behavior is normal. Judgment and thought content normal.    Labs reviewed:  Recent Labs  11/29/14 0830 11/29/14 1403 11/30/14 0504 07/25/15 0841 08/17/15 1036  NA  --  127* 130* 134*  --   K  --  4.1 4.3 3.4* 3.4*  CL  --  92* 96* 96*  --   CO2  --  29 27 33*  --   GLUCOSE  --  108* 86 89  --   BUN  --  26* 24* 10  --   CREATININE  --  0.64 0.54 0.51  --   CALCIUM  --  9.8 9.9 9.9  --   MG 1.6*  --   --  1.7  --     Recent Labs  11/29/14 0438 07/25/15 0841  AST 19 20  ALT 27 24  ALKPHOS 34* 69  BILITOT 0.9 0.4  PROT 5.3* 6.6  ALBUMIN 3.0* 3.7    Recent Labs  11/29/14 0438 11/30/14 0504 07/25/15 0841  WBC 12.4* 14.5* 4.7  NEUTROABS  --   --  2.8  HGB 7.4* 11.3* 13.0  HCT 21.2* 33.6* 37.2  MCV 91.7 86.2 89.9  PLT 233 253 212   Lab Results  Component Value Date   TSH 4.585 (H) 07/25/2015   Lab Results  Component Value Date   HGBA1C 5.6 06/23/2009   Lab Results  Component Value Date   CHOL 201 (H) 07/25/2015   HDL 41 07/25/2015   LDLCALC 138 (H) 07/25/2015   LDLDIRECT 161.4 12/14/2008   TRIG 108 07/25/2015   CHOLHDL 4.9 07/25/2015    Significant Diagnostic Results in last 30 days:  No results found.  Assessment/Plan 1. Acute bronchitis, unspecified  organism  Augmentin 875/125 mg 1 tablet Q 12 hours x 7 days  Flonase 1 spray each nostril daily  Flu test   Family/  staff Communication:   Total Time:  Documentation:  Face to Face:  Family/Phone:   Labs/tests ordered:  Flu test  Medication list reviewed and assessed for continued appropriateness.  Vikki Ports, NP-C Geriatrics [facility] [phone], [place] [address] Cell Phone (Mon-Fri 8am-5pm):  [phone] On Call:  [phone] & follow prompts after 5pm & weekends Office Phone:  [phone] Office Fax:  [phone]

## 2015-12-08 ENCOUNTER — Encounter
Admission: RE | Admit: 2015-12-08 | Discharge: 2015-12-08 | Disposition: A | Discharge status: Home / Self Care | Payer: PPO | Source: Ambulatory Visit | Attending: Internal Medicine | Admitting: Internal Medicine

## 2015-12-14 DIAGNOSIS — Z8673 Personal history of transient ischemic attack (TIA), and cerebral infarction without residual deficits: Secondary | ICD-10-CM | POA: Diagnosis not present

## 2015-12-14 DIAGNOSIS — F0390 Unspecified dementia without behavioral disturbance: Secondary | ICD-10-CM | POA: Diagnosis not present

## 2016-01-08 ENCOUNTER — Encounter
Admission: RE | Admit: 2016-01-08 | Discharge: 2016-01-08 | Disposition: A | Discharge status: Home / Self Care | Payer: PPO | Source: Ambulatory Visit | Attending: Internal Medicine | Admitting: Internal Medicine

## 2016-01-08 DIAGNOSIS — R05 Cough: Secondary | ICD-10-CM | POA: Insufficient documentation

## 2016-01-31 DIAGNOSIS — R05 Cough: Secondary | ICD-10-CM | POA: Diagnosis not present

## 2016-01-31 LAB — INFLUENZA PANEL BY PCR (TYPE A & B)
INFLBPCR: NEGATIVE
Influenza A By PCR: POSITIVE — AB

## 2016-02-08 ENCOUNTER — Encounter
Admission: RE | Admit: 2016-02-08 | Discharge: 2016-02-08 | Disposition: A | Discharge status: Home / Self Care | Payer: PPO | Source: Ambulatory Visit | Attending: Internal Medicine | Admitting: Internal Medicine

## 2016-02-13 DIAGNOSIS — F419 Anxiety disorder, unspecified: Secondary | ICD-10-CM | POA: Diagnosis not present

## 2016-02-13 DIAGNOSIS — F329 Major depressive disorder, single episode, unspecified: Secondary | ICD-10-CM | POA: Diagnosis not present

## 2016-02-15 ENCOUNTER — Non-Acute Institutional Stay (SKILLED_NURSING_FACILITY): Payer: PPO | Admitting: Gerontology

## 2016-02-15 DIAGNOSIS — R05 Cough: Secondary | ICD-10-CM

## 2016-02-15 DIAGNOSIS — F418 Other specified anxiety disorders: Secondary | ICD-10-CM | POA: Diagnosis not present

## 2016-02-15 DIAGNOSIS — F329 Major depressive disorder, single episode, unspecified: Secondary | ICD-10-CM

## 2016-02-15 DIAGNOSIS — F32A Depression, unspecified: Secondary | ICD-10-CM

## 2016-02-15 DIAGNOSIS — F419 Anxiety disorder, unspecified: Principal | ICD-10-CM

## 2016-02-15 DIAGNOSIS — R059 Cough, unspecified: Secondary | ICD-10-CM

## 2016-02-21 NOTE — Progress Notes (Signed)
Location:      Place of Service:  SNF (31) Provider:  Toni Arthurs, NP-C  [name], MD  Patient Care Team: [name], MD as PCP - General (Family Medicine)  Extended Emergency Contact Information Primary Emergency Contact: Norm Salt Address: [place]          [name] Phone: [phone] Mobile Phone: [phone] Relation: Spouse Secondary Emergency Contact: Barry Dienes, [place] [address] Johnnette Litter of [place] Phone: [phone] Mobile Phone: [phone] Relation: Daughter  Code Status:  full Goals of care: Advanced Directive information Advanced Directives 08/25/2015  Does Patient Have a Medical Advance Directive? Yes  Type of Paramedic of [place];Living will  Does patient want to make changes to medical advance directive? No - Patient declined  Copy of [place] in Chart? No - copy requested  Would patient like information on creating a medical advance directive? -     Chief Complaint  Patient presents with  . Acute Visit    HPI:  Pt is a 76 y.o. female seen today for an acute visit for cough that has been ongoing for 1 week. Pt recently completed treatment for Influenza A and residual cough remains. Pt c/o feeling like phlygm is caught in her throat causing irritation. Cough is non-productive. Pt denies chest pain or shortness of breath. Pt has been afebrile. Pt also complains of increased symptoms of depression and anxiety. She is also displaying some paranoia at times. She is angry at staff for perceived "wrongs" against her. She thinks staff is talking negatively about her. Says, "If I was in charge, I'd fire every single one of them," [staff.] Says she just "doesn't feel right on the inside. I can't describe it, it just doesn't feel good." Pt is working with [facility] walking and doing ROM. Despite the increased mobility,  she is not feeling encouraged. Pt is tearful at times. Otherwise, pt complaints are non-specific. VSS    Past Medical History:  Diagnosis Date  . Anxiety   . Arthritis   . Asthma   . 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 Depression   . GERD (gastroesophageal reflux disease)   . Hemiparesis affecting left side as late effect of cerebrovascular accident (CVA) ([place])   . Hyperlipidemia   . Hypertension   . Osteoarthritis   . Stroke ([place])   . TIA (transient ischemic attack)    Past Surgical History:  Procedure Laterality Date  . ABDOMINAL HYSTERECTOMY    . ACHILLES TENDON LENGTHENING  08/25/2015   Procedure: ACHILLES TENDON LENGTHENING;  Surgeon: Samara Deist, DPM;  Location: [facility] ORS;  Service: Podiatry;;  . APPENDECTOMY    . EYE SURGERY Bilateral    Cataract Extraction with IOL  . FLAT FOOT RECONSTRUCTION-TAL GASTROC RECESSION Left 08/25/2015   Procedure: ENDOSCOPIC FLAT FOOT RECONSTRUCTION-TAL GASTROC RECESSION;  Surgeon: Samara Deist, DPM;  Location: [facility] ORS;  Service: Podiatry;  Laterality: Left;    Allergies  Allergen Reactions  . Ace Inhibitors Cough  . Latex Hives  . Lisinopril Cough  . Tetracycline Other (See Comments)    Reaction:  Unknown   . Zanaflex [Tizanidine Hcl] Other (See Comments)    "drowsy"    Allergies as of 02/15/2016      Reactions   Ace Inhibitors Cough   Latex Hives   Lisinopril Cough   Tetracycline Other (See Comments)  Reaction:  Unknown    Zanaflex [tizanidine Hcl] Other (See Comments)   "drowsy"      Medication List       Accurate as of 02/15/16 11:59 PM. Always use your most recent med list.          acetaminophen 500 MG tablet Commonly known as:  TYLENOL Take 500 mg by mouth every 8 (eight) hours as needed for mild pain.   amLODipine 5 MG tablet Commonly known as:  NORVASC Take 5 mg by mouth daily.   CALCIUM 600+D 600-400 MG-UNIT tablet Generic drug:  Calcium Carbonate-Vitamin D Take 1 tablet by mouth 2 (two)  times daily.   carvedilol 3.125 MG tablet Commonly known as:  COREG Take 3.125 mg by mouth 2 (two) times daily.   dipyridamole-aspirin 200-25 MG 12hr capsule Commonly known as:  AGGRENOX Take 1 capsule by mouth 2 (two) times daily.   gabapentin 300 MG capsule Commonly known as:  NEURONTIN Take 300 mg by mouth 3 (three) times daily.   hydrochlorothiazide 25 MG tablet Commonly known as:  HYDRODIURIL Take 25 mg by mouth daily.   ICY HOT EXTRA STRENGTH 10-30 % Crea Apply 1 application topically 3 (three) times daily as needed (pain in left shoulder and bicep).   ipratropium-albuterol 0.5-2.5 (3) MG/3ML Soln Commonly known as:  DUONEB Take 3 mLs by nebulization every 6 (six) hours as needed.   LORazepam 0.5 MG tablet Commonly known as:  ATIVAN Take 0.5 mg by mouth 2 (two) times daily as needed for anxiety.   losartan 50 MG tablet Commonly known as:  COZAAR Take 50 mg by mouth 2 (two) times daily.   lovastatin 20 MG tablet Commonly known as:  MEVACOR Take 20 mg by mouth at bedtime.   montelukast 10 MG tablet Commonly known as:  SINGULAIR Take 10 mg by mouth at bedtime.   MUCINEX MAXIMUM STRENGTH 1200 MG Tb12 Generic drug:  Guaifenesin Take 1,200 mg by mouth 2 (two) times daily.   omeprazole 20 MG capsule Commonly known as:  PRILOSEC Take 20 mg by mouth daily.   oxybutynin 5 MG tablet Commonly known as:  DITROPAN Take 5 mg by mouth 2 (two) times daily.   oxyCODONE-acetaminophen 5-325 MG tablet Commonly known as:  ROXICET Take 1-2 tablets by mouth every 4 (four) hours as needed for severe pain.   traMADol 50 MG tablet Commonly known as:  ULTRAM Take 1 tablet (50 mg total) by mouth every 8 (eight) hours as needed for moderate pain.   trolamine salicylate 10 % cream Commonly known as:  ASPERCREME Apply 1 application topically 3 (three) times daily as needed for muscle pain.   venlafaxine XR 75 MG 24 hr capsule Commonly known as:  EFFEXOR-XR Take 75 mg by mouth  daily with breakfast.       Review of Systems  Constitutional: Negative for activity change, appetite change, chills, diaphoresis and fever.  HENT: Positive for postnasal drip. Negative for congestion, rhinorrhea, sinus pain, sinus pressure, sneezing, sore throat, trouble swallowing and voice change.   Eyes: Negative for pain, redness and visual disturbance.  Respiratory: Positive for cough. Negative for apnea, choking, chest tightness, shortness of breath and wheezing.   Cardiovascular: Negative for chest pain, palpitations and leg swelling.  Gastrointestinal: Negative for abdominal distention, abdominal pain, constipation, diarrhea and nausea.  Genitourinary: Negative for difficulty urinating, dysuria, frequency and urgency.  Musculoskeletal: Negative for back pain, gait problem and myalgias. Arthralgias: typical arthritis.  Skin: Negative for color change, pallor,  rash and wound.  Neurological: Negative for dizziness, tremors, syncope, speech difficulty, weakness, numbness and headaches.  Psychiatric/Behavioral: Positive for dysphoric mood. Negative for agitation and behavioral problems. The patient is nervous/anxious.   All other systems reviewed and are negative.   Immunization History  Administered Date(s) Administered  . Influenza Whole 01/08/2004, 10/08/2006, 11/22/2008  . Pneumococcal Polysaccharide-23 10/24/2000, 11/30/2014  . Td 01/07/2001  . Zoster 01/07/2006   Pertinent  Health Maintenance Due  Topic Date Due  . DEXA SCAN  11/20/2004  . INFLUENZA VACCINE  08/08/2015  . PNA vac Low Risk Adult (2 of 2 - PCV13) 11/30/2015   No flowsheet data found. Functional Status Survey:    Vitals:   02/15/16 0900  BP: 130/76  Pulse: 78  Resp: 18  SpO2: 96%   There is no height or weight on file to calculate BMI. Physical Exam  Constitutional: She is oriented to person, place, and time. Vital signs are normal. She appears well-developed and well-nourished. She is active and  cooperative. She does not appear ill. No distress.  HENT:  Head: Normocephalic and atraumatic.  Mouth/Throat: Uvula is midline, oropharynx is clear and moist and mucous membranes are normal. Mucous membranes are not pale, not dry and not cyanotic.  Eyes: Conjunctivae, EOM and lids are normal. Pupils are equal, round, and reactive to light. Right eye exhibits no discharge. Left eye exhibits no discharge.  Neck: Trachea normal, normal range of motion and full passive range of motion without pain. Neck supple. No JVD present. No tracheal deviation, no edema, no erythema and normal range of motion present. No thyroid mass and no thyromegaly present.  Cardiovascular: Normal rate, regular rhythm, normal heart sounds, intact distal pulses and normal pulses.  Exam reveals no gallop, no distant heart sounds and no friction rub.   No murmur heard. Pulmonary/Chest: Effort normal. No accessory muscle usage. No respiratory distress. She has no decreased breath sounds. She has wheezes in the right lower field and the left lower field. She has no rhonchi. She has no rales. She exhibits no tenderness.  Abdominal: Soft. Normal appearance and bowel sounds are normal. She exhibits no distension and no ascites. There is no tenderness. There is no guarding.  Musculoskeletal: Normal range of motion. She exhibits no edema, tenderness or deformity.  Expected osteoarthritis, stiffness  Lymphadenopathy:       Head (right side): No submental, no submandibular and no tonsillar adenopathy present.       Head (left side): No submental, no submandibular and no tonsillar adenopathy present.       Right cervical: No superficial cervical adenopathy present.      Left cervical: No superficial cervical adenopathy present.  Neurological: She is alert and oriented to person, place, and time. She has normal strength.  Skin: Skin is warm, dry and intact. No rash noted. She is not diaphoretic. No cyanosis or erythema. No pallor. Nails  show no clubbing.  Psychiatric: Her speech is normal. Judgment normal. Her mood appears anxious. Her affect is blunt. She is withdrawn. Thought content is paranoid and delusional. Cognition and memory are impaired. She exhibits a depressed mood.  Nursing note and vitals reviewed.   Labs reviewed:  Recent Labs  07/25/15 0841 08/17/15 1036  NA 134*  --   K 3.4* 3.4*  CL 96*  --   CO2 33*  --   GLUCOSE 89  --   BUN 10  --   CREATININE 0.51  --   CALCIUM 9.9  --  MG 1.7  --     Recent Labs  07/25/15 0841  AST 20  ALT 24  ALKPHOS 69  BILITOT 0.4  PROT 6.6  ALBUMIN 3.7    Recent Labs  07/25/15 0841  WBC 4.7  NEUTROABS 2.8  HGB 13.0  HCT 37.2  MCV 89.9  PLT 212   Lab Results  Component Value Date   TSH 4.585 (H) 07/25/2015   Lab Results  Component Value Date   HGBA1C 5.6 06/23/2009   Lab Results  Component Value Date   CHOL 201 (H) 07/25/2015   HDL 41 07/25/2015   LDLCALC 138 (H) 07/25/2015   LDLDIRECT 161.4 12/14/2008   TRIG 108 07/25/2015   CHOLHDL 4.9 07/25/2015    Significant Diagnostic Results in last 30 days:  No results found.  Assessment/Plan 1. Anxiety and depression  Increase Effexor to 187.5 mg po Q Day  Begin Depakote 125 mg po QHS x 3 days, then   Depakote 125 mg po BID for mood stabilization  2. Cough  Schedule Duonebs TID x 3 days  Continue Guaifenesin 10 mL po Q 4 hours prn  Atrovent nasal spray 0.03%- 2 sprays in each nostril Q HS for post nasal drip  Continue Flonase- 1 spray each nostril Q am  Family/ staff Communication:   Total Time:  Documentation:  Face to Face:  Family/Phone:   Labs/tests ordered:    Medication list reviewed and assessed for continued appropriateness.  Vikki Ports, NP-C Geriatrics [facility] [phone], [place] [address] Cell Phone (Mon-Fri 8am-5pm):  [phone] On Call:  [phone] & follow prompts after 5pm & weekends Office  Phone:  [phone] Office Fax:  [phone]

## 2016-02-27 DIAGNOSIS — F39 Unspecified mood [affective] disorder: Secondary | ICD-10-CM | POA: Diagnosis not present

## 2016-02-27 DIAGNOSIS — F329 Major depressive disorder, single episode, unspecified: Secondary | ICD-10-CM | POA: Diagnosis not present

## 2016-02-27 DIAGNOSIS — F419 Anxiety disorder, unspecified: Secondary | ICD-10-CM | POA: Diagnosis not present

## 2016-03-07 ENCOUNTER — Encounter
Admission: RE | Admit: 2016-03-07 | Discharge: 2016-03-07 | Disposition: A | Discharge status: Home / Self Care | Payer: PPO | Source: Ambulatory Visit | Attending: Internal Medicine | Admitting: Internal Medicine

## 2016-03-14 ENCOUNTER — Non-Acute Institutional Stay (SKILLED_NURSING_FACILITY): Payer: PPO | Admitting: Gerontology

## 2016-03-14 DIAGNOSIS — B078 Other viral warts: Secondary | ICD-10-CM | POA: Diagnosis not present

## 2016-04-02 NOTE — Progress Notes (Signed)
Location:      Place of Service:  SNF (31) Provider:  Toni Arthurs, NP-C  [name], MD  Patient Care Team: [name], MD as PCP - General (Family Medicine)  Extended Emergency Contact Information Primary Emergency Contact: Norm Salt Address: [place]          [name] of [place] Phone: [phone] Mobile Phone: [phone] Relation: Spouse Secondary Emergency Contact: Barry Dienes, [place] [address] Johnnette Litter of [place] Phone: [phone] Mobile Phone: [phone] Relation: Daughter  Code Status:  full Goals of care: Advanced Directive information Advanced Directives 08/25/2015  Does Patient Have a Medical Advance Directive? Yes  Type of Paramedic of [place];Living will  Does patient want to make changes to medical advance directive? No - Patient declined  Copy of [place] in Chart? No - copy requested  Would patient like information on creating a medical advance directive? -     Chief Complaint  Patient presents with  . Acute Visit    HPI:  Pt is a 76 y.o. female seen today for an acute visit for wart on the back of the hand. Pt c/o wart on the dorsal surface of the Right Hand that has been present for several weeks. The wart is not painful, but is irritating. Pt wishes to have medication to remove the wart. Area is raised, flaky appearing. Surrounding tissue is intact. No drainage, no redness, no warmth. No other complaints at this time.    Past Medical History:  Diagnosis Date  . Anxiety   . Arthritis   . Asthma   . 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 Depression   . GERD (gastroesophageal reflux disease)   . Hemiparesis affecting left side as late effect of cerebrovascular accident (CVA) ([place])   . Hyperlipidemia   . Hypertension   . Osteoarthritis   . Stroke ([place])   . TIA (transient ischemic attack)    Past Surgical  History:  Procedure Laterality Date  . ABDOMINAL HYSTERECTOMY    . ACHILLES TENDON LENGTHENING  08/25/2015   Procedure: ACHILLES TENDON LENGTHENING;  Surgeon: Samara Deist, DPM;  Location: [facility] ORS;  Service: Podiatry;;  . APPENDECTOMY    . EYE SURGERY Bilateral    Cataract Extraction with IOL  . FLAT FOOT RECONSTRUCTION-TAL GASTROC RECESSION Left 08/25/2015   Procedure: ENDOSCOPIC FLAT FOOT RECONSTRUCTION-TAL GASTROC RECESSION;  Surgeon: Samara Deist, DPM;  Location: [facility] ORS;  Service: Podiatry;  Laterality: Left;    Allergies  Allergen Reactions  . Ace Inhibitors Cough  . Latex Hives  . Lisinopril Cough  . Tetracycline Other (See Comments)    Reaction:  Unknown   . Zanaflex [Tizanidine Hcl] Other (See Comments)    "drowsy"    Allergies as of 03/14/2016      Reactions   Ace Inhibitors Cough   Latex Hives   Lisinopril Cough   Tetracycline Other (See Comments)   Reaction:  Unknown    Zanaflex [tizanidine Hcl] Other (See Comments)   "drowsy"      Medication List       Accurate as of 03/14/16 11:59 PM. Always use your most recent med list.          acetaminophen 500 MG tablet Commonly known as:  TYLENOL Take 500 mg by mouth every 8 (eight) hours as needed for mild pain.   amLODipine 5 MG tablet Commonly  known as:  NORVASC Take 5 mg by mouth daily.   CALCIUM 600+D 600-400 MG-UNIT tablet Generic drug:  Calcium Carbonate-Vitamin D Take 1 tablet by mouth 2 (two) times daily.   carvedilol 3.125 MG tablet Commonly known as:  COREG Take 3.125 mg by mouth 2 (two) times daily.   dipyridamole-aspirin 200-25 MG 12hr capsule Commonly known as:  AGGRENOX Take 1 capsule by mouth 2 (two) times daily.   gabapentin 300 MG capsule Commonly known as:  NEURONTIN Take 300 mg by mouth 3 (three) times daily.   hydrochlorothiazide 25 MG tablet Commonly known as:  HYDRODIURIL Take 25 mg by mouth daily.   ICY HOT EXTRA STRENGTH 10-30 % Crea Apply 1 application topically 3  (three) times daily as needed (pain in left shoulder and bicep).   ipratropium-albuterol 0.5-2.5 (3) MG/3ML Soln Commonly known as:  DUONEB Take 3 mLs by nebulization every 6 (six) hours as needed.   LORazepam 0.5 MG tablet Commonly known as:  ATIVAN Take 0.5 mg by mouth 2 (two) times daily as needed for anxiety.   losartan 50 MG tablet Commonly known as:  COZAAR Take 50 mg by mouth 2 (two) times daily.   lovastatin 20 MG tablet Commonly known as:  MEVACOR Take 20 mg by mouth at bedtime.   montelukast 10 MG tablet Commonly known as:  SINGULAIR Take 10 mg by mouth at bedtime.   MUCINEX MAXIMUM STRENGTH 1200 MG Tb12 Generic drug:  Guaifenesin Take 1,200 mg by mouth 2 (two) times daily.   omeprazole 20 MG capsule Commonly known as:  PRILOSEC Take 20 mg by mouth daily.   oxybutynin 5 MG tablet Commonly known as:  DITROPAN Take 5 mg by mouth 2 (two) times daily.   oxyCODONE-acetaminophen 5-325 MG tablet Commonly known as:  ROXICET Take 1-2 tablets by mouth every 4 (four) hours as needed for severe pain.   traMADol 50 MG tablet Commonly known as:  ULTRAM Take 1 tablet (50 mg total) by mouth every 8 (eight) hours as needed for moderate pain.   trolamine salicylate 10 % cream Commonly known as:  ASPERCREME Apply 1 application topically 3 (three) times daily as needed for muscle pain.   venlafaxine XR 75 MG 24 hr capsule Commonly known as:  EFFEXOR-XR Take 75 mg by mouth daily with breakfast.       Review of Systems  Constitutional: Negative.   HENT: Negative.   Respiratory: Negative.   Cardiovascular: Negative.   Gastrointestinal: Negative.   Genitourinary: Negative.   Musculoskeletal: Negative.   Skin: Positive for wound.  Neurological: Negative.   Psychiatric/Behavioral: Negative.     Immunization History  Administered Date(s) Administered  . Influenza Whole 01/08/2004, 10/08/2006, 11/22/2008  . Pneumococcal Polysaccharide-23 10/24/2000, 11/30/2014  .  Td 01/07/2001  . Zoster 01/07/2006   Pertinent  Health Maintenance Due  Topic Date Due  . DEXA SCAN  11/20/2004  . INFLUENZA VACCINE  08/08/2015  . PNA vac Low Risk Adult (2 of 2 - PCV13) 11/30/2015   No flowsheet data found. Functional Status Survey:    Vitals:   03/13/16 1740  BP: 130/69  Pulse: 74   There is no height or weight on file to calculate BMI. Physical Exam  Constitutional: She is oriented to person, place, and time. She appears well-developed and well-nourished.  HENT:  Head: Normocephalic and atraumatic.  Eyes: Pupils are equal, round, and reactive to light.  Neck: Normal range of motion.  Pulmonary/Chest: Effort normal.  Neurological: She is alert and  oriented to person, place, and time.  Skin: Skin is warm and dry. Lesion noted.     Psychiatric: She has a normal mood and affect. Her speech is normal and behavior is normal. Judgment and thought content normal. Cognition and memory are normal.    Labs reviewed:  Recent Labs  07/25/15 0841 08/17/15 1036  NA 134*  --   K 3.4* 3.4*  CL 96*  --   CO2 33*  --   GLUCOSE 89  --   BUN 10  --   CREATININE 0.51  --   CALCIUM 9.9  --   MG 1.7  --     Recent Labs  07/25/15 0841  AST 20  ALT 24  ALKPHOS 69  BILITOT 0.4  PROT 6.6  ALBUMIN 3.7    Recent Labs  07/25/15 0841  WBC 4.7  NEUTROABS 2.8  HGB 13.0  HCT 37.2  MCV 89.9  PLT 212   Lab Results  Component Value Date   TSH 4.585 (H) 07/25/2015   Lab Results  Component Value Date   HGBA1C 5.6 06/23/2009   Lab Results  Component Value Date   CHOL 201 (H) 07/25/2015   HDL 41 07/25/2015   LDLCALC 138 (H) 07/25/2015   LDLDIRECT 161.4 12/14/2008   TRIG 108 07/25/2015   CHOLHDL 4.9 07/25/2015    Significant Diagnostic Results in last 30 days:  No results found.  Assessment/Plan 1. Common wart  Wash hand well  Soak hand in warm water for at least 5 minutes  Apply enough medicine (Compound W 17%)  to cover wart  Covered  Band-Aid twice a day until healed  Family/ staff Communication:   Total Time:  Documentation:  Face to Face:  Family/Phone:   Labs/tests ordered:    Medication list reviewed and assessed for continued appropriateness.  Vikki Ports, NP-C Geriatrics [facility] [phone], [place] [address] Cell Phone (Mon-Fri 8am-5pm):  [phone] On Call:  [phone] & follow prompts after 5pm & weekends Office Phone:  [phone] Office Fax:  [phone]

## 2016-04-07 ENCOUNTER — Encounter
Admission: RE | Admit: 2016-04-07 | Discharge: 2016-04-07 | Disposition: A | Discharge status: Home / Self Care | Payer: PPO | Source: Ambulatory Visit | Attending: Internal Medicine | Admitting: Internal Medicine

## 2016-04-09 DIAGNOSIS — I69354 Hemiplegia and hemiparesis following cerebral infarction affecting left non-dominant side: Secondary | ICD-10-CM | POA: Diagnosis not present

## 2016-04-09 DIAGNOSIS — M24522 Contracture, left elbow: Secondary | ICD-10-CM | POA: Diagnosis not present

## 2016-04-09 DIAGNOSIS — M24542 Contracture, left hand: Secondary | ICD-10-CM | POA: Diagnosis not present

## 2016-04-09 DIAGNOSIS — I69398 Other sequelae of cerebral infarction: Secondary | ICD-10-CM | POA: Diagnosis not present

## 2016-04-23 DIAGNOSIS — F39 Unspecified mood [affective] disorder: Secondary | ICD-10-CM | POA: Diagnosis not present

## 2016-04-23 DIAGNOSIS — F419 Anxiety disorder, unspecified: Secondary | ICD-10-CM | POA: Diagnosis not present

## 2016-04-23 DIAGNOSIS — F329 Major depressive disorder, single episode, unspecified: Secondary | ICD-10-CM | POA: Diagnosis not present

## 2016-04-23 DIAGNOSIS — F4310 Post-traumatic stress disorder, unspecified: Secondary | ICD-10-CM | POA: Diagnosis not present

## 2016-05-07 ENCOUNTER — Encounter
Admission: RE | Admit: 2016-05-07 | Discharge: 2016-05-07 | Disposition: A | Discharge status: Home / Self Care | Payer: PPO | Source: Ambulatory Visit | Attending: Internal Medicine | Admitting: Internal Medicine

## 2016-05-07 DIAGNOSIS — H35371 Puckering of macula, right eye: Secondary | ICD-10-CM | POA: Diagnosis not present

## 2016-05-29 ENCOUNTER — Non-Acute Institutional Stay (SKILLED_NURSING_FACILITY): Payer: PPO | Admitting: Gerontology

## 2016-05-29 DIAGNOSIS — N3281 Overactive bladder: Secondary | ICD-10-CM

## 2016-05-29 DIAGNOSIS — I10 Essential (primary) hypertension: Secondary | ICD-10-CM

## 2016-05-29 DIAGNOSIS — J45909 Unspecified asthma, uncomplicated: Secondary | ICD-10-CM | POA: Diagnosis not present

## 2016-05-29 DIAGNOSIS — G629 Polyneuropathy, unspecified: Secondary | ICD-10-CM

## 2016-05-29 DIAGNOSIS — M79605 Pain in left leg: Secondary | ICD-10-CM

## 2016-05-29 DIAGNOSIS — J309 Allergic rhinitis, unspecified: Secondary | ICD-10-CM

## 2016-05-29 DIAGNOSIS — F4310 Post-traumatic stress disorder, unspecified: Secondary | ICD-10-CM

## 2016-05-29 DIAGNOSIS — M21272 Flexion deformity, left ankle and toes: Secondary | ICD-10-CM

## 2016-05-29 DIAGNOSIS — F329 Major depressive disorder, single episode, unspecified: Secondary | ICD-10-CM | POA: Diagnosis not present

## 2016-05-29 DIAGNOSIS — M79602 Pain in left arm: Secondary | ICD-10-CM

## 2016-05-29 DIAGNOSIS — M24542 Contracture, left hand: Secondary | ICD-10-CM | POA: Diagnosis not present

## 2016-05-29 DIAGNOSIS — M19012 Primary osteoarthritis, left shoulder: Secondary | ICD-10-CM | POA: Insufficient documentation

## 2016-05-29 DIAGNOSIS — L84 Corns and callosities: Secondary | ICD-10-CM

## 2016-05-29 DIAGNOSIS — H04123 Dry eye syndrome of bilateral lacrimal glands: Secondary | ICD-10-CM

## 2016-05-29 DIAGNOSIS — E785 Hyperlipidemia, unspecified: Secondary | ICD-10-CM | POA: Diagnosis not present

## 2016-05-29 DIAGNOSIS — E041 Nontoxic single thyroid nodule: Secondary | ICD-10-CM

## 2016-05-29 DIAGNOSIS — J3089 Other allergic rhinitis: Secondary | ICD-10-CM | POA: Insufficient documentation

## 2016-05-29 DIAGNOSIS — M8580 Other specified disorders of bone density and structure, unspecified site: Secondary | ICD-10-CM

## 2016-05-29 DIAGNOSIS — R279 Unspecified lack of coordination: Secondary | ICD-10-CM | POA: Insufficient documentation

## 2016-05-29 DIAGNOSIS — I69398 Other sequelae of cerebral infarction: Secondary | ICD-10-CM | POA: Diagnosis not present

## 2016-05-29 DIAGNOSIS — E7800 Pure hypercholesterolemia, unspecified: Secondary | ICD-10-CM

## 2016-05-29 DIAGNOSIS — E039 Hypothyroidism, unspecified: Secondary | ICD-10-CM

## 2016-05-29 DIAGNOSIS — I69354 Hemiplegia and hemiparesis following cerebral infarction affecting left non-dominant side: Secondary | ICD-10-CM

## 2016-05-29 DIAGNOSIS — K219 Gastro-esophageal reflux disease without esophagitis: Secondary | ICD-10-CM

## 2016-05-29 NOTE — Progress Notes (Signed)
Location:      Place of Service:  SNF (31) Provider:  Toni Arthurs, NP-C  [name], MD  Patient Care Team: [name], MD as PCP - General (Family Medicine)  Extended Emergency Contact Information Primary Emergency Contact: Norm Salt Address: [address]          [name] Phone: [phone] Mobile Phone: [phone] Relation: Spouse Secondary Emergency Contact: Barry Dienes, [place] [address] [place] of [place] Phone: [phone] Mobile Phone: [phone] Relation: Daughter  Code Status:  FULL Goals of care: Advanced Directive information Advanced Directives 08/25/2015  Does Patient Have a Medical Advance Directive? Yes  Type of Paramedic of [place];Living will  Does patient want to make changes to medical advance directive? No - Patient declined  Copy of [place] in Chart? No - copy requested  Would patient like information on creating a medical advance directive? -     Chief Complaint  Patient presents with  . Medical Management of Chronic Issues    HPI:  Pt is a 76 y.o. female seen today for medical management of chronic diseases. Pt has been stable this past month. No new falls. Pt did begin having increased sx of PTSD and increased agitation despite improvement of symptoms a few months ago. I spoke at length with [facility] NP regarding meds for pt. Psych NP suggested Prazosin for the PTSD. Pt has been on this med for several weeks and is doing well with it. Pt is again smiling and seems content. She is not as tearful or fearful of staff as she was. She participates in most activities and seems to enjoy being around other residents. Appetite is good. She is having regular BMs. VSS. No other complaints.    Past Medical History:  Diagnosis Date  . Anxiety   . Arthritis   . Asthma   . Contracture of joint of finger of left hand    . Depression   . GERD (gastroesophageal reflux disease)   . Hemiparesis affecting left side as late effect of cerebrovascular accident (CVA) ([place])   . Hyperlipidemia   . Hypertension   . Osteoarthritis   . Stroke ([place])   . TIA (transient ischemic attack)    Past Surgical History:  Procedure Laterality Date  . ABDOMINAL HYSTERECTOMY    . ACHILLES TENDON LENGTHENING  08/25/2015   Procedure: ACHILLES TENDON LENGTHENING;  Surgeon: Samara Deist, DPM;  Location: [facility] ORS;  Service: Podiatry;;  . APPENDECTOMY    . EYE SURGERY Bilateral    Cataract Extraction with IOL  . FLAT FOOT RECONSTRUCTION-TAL GASTROC RECESSION Left 08/25/2015   Procedure: ENDOSCOPIC FLAT FOOT RECONSTRUCTION-TAL GASTROC RECESSION;  Surgeon: Samara Deist, DPM;  Location: [facility] ORS;  Service: Podiatry;  Laterality: Left;    Allergies  Allergen Reactions  . Ace Inhibitors Cough  . Latex Hives  . Lisinopril Cough  . Tetracycline Other (See Comments)    Reaction:  Unknown   . Zanaflex [Tizanidine Hcl] Other (See Comments)    "drowsy"    Allergies as of 05/29/2016      Reactions   Ace Inhibitors Cough   Latex Hives   Lisinopril Cough   Tetracycline Other (See Comments)   Reaction:  Unknown    Zanaflex [tizanidine Hcl] Other (See Comments)   "drowsy"      Medication List       Accurate as of 05/29/16  3:28 PM. Always use your most recent med list.          acetaminophen 500 MG tablet Commonly known as:  TYLENOL Take 500 mg by mouth every 8 (eight) hours as needed for mild pain.   amLODipine 5 MG tablet Commonly known as:  NORVASC Take 5 mg by mouth daily.   CALCIUM 600+D 600-400 MG-UNIT tablet Generic drug:  Calcium Carbonate-Vitamin D Take 1 tablet by mouth 2 (two) times daily.   carvedilol 3.125 MG tablet Commonly known as:  COREG Take 3.125 mg by mouth 2 (two) times daily.   dipyridamole-aspirin 200-25 MG 12hr capsule Commonly known as:  AGGRENOX Take 1 capsule by mouth 2 (two) times  daily.   gabapentin 300 MG capsule Commonly known as:  NEURONTIN Take 300 mg by mouth 3 (three) times daily.   hydrochlorothiazide 25 MG tablet Commonly known as:  HYDRODIURIL Take 25 mg by mouth daily.   ICY HOT EXTRA STRENGTH 10-30 % Crea Apply 1 application topically 3 (three) times daily as needed (pain in left shoulder and bicep).   ipratropium-albuterol 0.5-2.5 (3) MG/3ML Soln Commonly known as:  DUONEB Take 3 mLs by nebulization every 6 (six) hours as needed.   LORazepam 0.5 MG tablet Commonly known as:  ATIVAN Take 0.5 mg by mouth 2 (two) times daily as needed for anxiety.   losartan 50 MG tablet Commonly known as:  COZAAR Take 50 mg by mouth 2 (two) times daily.   lovastatin 20 MG tablet Commonly known as:  MEVACOR Take 20 mg by mouth at bedtime.   montelukast 10 MG tablet Commonly known as:  SINGULAIR Take 10 mg by mouth at bedtime.   MUCINEX MAXIMUM STRENGTH 1200 MG Tb12 Generic drug:  Guaifenesin Take 1,200 mg by mouth 2 (two) times daily.   omeprazole 20 MG capsule Commonly known as:  PRILOSEC Take 20 mg by mouth daily.   oxybutynin 5 MG tablet Commonly known as:  DITROPAN Take 5 mg by mouth 2 (two) times daily.   oxyCODONE-acetaminophen 5-325 MG tablet Commonly known as:  ROXICET Take 1-2 tablets by mouth every 4 (four) hours as needed for severe pain.   traMADol 50 MG tablet Commonly known as:  ULTRAM Take 1 tablet (50 mg total) by mouth every 8 (eight) hours as needed for moderate pain.   trolamine salicylate 10 % cream Commonly known as:  ASPERCREME Apply 1 application topically 3 (three) times daily as needed for muscle pain.   venlafaxine XR 75 MG 24 hr capsule Commonly known as:  EFFEXOR-XR Take 75 mg by mouth daily with breakfast.       Review of Systems  Constitutional: Negative for activity change, appetite change, chills, diaphoresis and fever.  HENT: Negative for congestion, rhinorrhea, sinus pain, sinus pressure, sneezing,  sore throat, trouble swallowing and voice change.   Eyes: Negative for pain, redness and visual disturbance.  Respiratory: Negative for apnea, choking, chest tightness, shortness of breath and wheezing.   Cardiovascular: Negative for chest pain, palpitations and leg swelling.  Gastrointestinal: Negative for abdominal distention, abdominal pain, constipation, diarrhea and nausea.  Genitourinary: Negative for difficulty urinating, dysuria, frequency and urgency.  Musculoskeletal: Negative for back pain, gait problem and myalgias. Arthralgias: typical arthritis.  Skin: Positive for wound. Negative for color change, pallor and rash.  Neurological: Negative for dizziness, tremors, syncope, speech difficulty, weakness, numbness and headaches.  Psychiatric/Behavioral: Negative for agitation and behavioral problems.  All other systems reviewed and are negative.   Immunization History  Administered Date(s) Administered  . Influenza Whole 01/08/2004, 10/08/2006, 11/22/2008  . Pneumococcal Polysaccharide-23 10/24/2000, 11/30/2014  . Td 01/07/2001  . Zoster 01/07/2006   Pertinent  Health Maintenance Due  Topic Date Due  . DEXA SCAN  11/20/2004  . PNA vac Low Risk Adult (2 of 2 - PCV13) 11/30/2015  . INFLUENZA VACCINE  08/07/2016   No flowsheet data found. Functional Status Survey:    Vitals:   05/22/16 0850  BP: 121/67  Pulse: 72  Resp: 18  Temp: 97.7 F (36.5 C)  SpO2: 94%   There is no height or weight on file to calculate BMI. Physical Exam  Constitutional: She is oriented to person, place, and time. Vital signs are normal. She appears well-developed and well-nourished. She is active and cooperative. She does not appear ill. No distress.  HENT:  Head: Normocephalic and atraumatic.  Mouth/Throat: Uvula is midline, oropharynx is clear and moist and mucous membranes are normal. Mucous membranes are not pale, not dry and not cyanotic.  Eyes: Conjunctivae, EOM and lids are normal.  Pupils are equal, round, and reactive to light. Right eye exhibits no discharge. Left eye exhibits no discharge.  Neck: Trachea normal, normal range of motion and full passive range of motion without pain. Neck supple. No JVD present. No tracheal deviation, no edema, no erythema and normal range of motion present. No thyroid mass and no thyromegaly present.  Cardiovascular: Normal rate, regular rhythm, normal heart sounds, intact distal pulses and normal pulses.  Exam reveals no gallop, no distant heart sounds and no friction rub.   No murmur heard. Pulmonary/Chest: Effort normal. No accessory muscle usage. No respiratory distress. She has no decreased breath sounds. She has no rhonchi. She has no rales. She exhibits no tenderness.  Abdominal: Soft. Normal appearance and bowel sounds are normal. She exhibits no distension and no ascites. There is no tenderness. There is no guarding.  Musculoskeletal: Normal range of motion. She exhibits no edema, tenderness or deformity.  Expected osteoarthritis, stiffness; calves soft and supple, negative homan's sign  Lymphadenopathy:       Head (right side): No submental, no submandibular and no tonsillar adenopathy present.       Head (left side): No submental, no submandibular and no tonsillar adenopathy present.       Right cervical: No superficial cervical adenopathy present.      Left cervical: No superficial cervical adenopathy present.  Neurological: She is alert and oriented to person, place, and time. She has normal strength.  Skin: Skin is warm, dry and intact. No rash noted. She is not diaphoretic. No cyanosis or erythema. No pallor. Nails show no clubbing.  Corn on Right 5th toe  Psychiatric: Her speech is normal. Judgment normal. Her mood appears anxious. Her affect is blunt. She is withdrawn. Thought content is paranoid and delusional. Cognition and memory are impaired. She exhibits a depressed mood.  Nursing note and vitals reviewed.   Labs  reviewed:  Recent Labs  07/25/15 0841 08/17/15 1036  NA 134*  --   K 3.4* 3.4*  CL 96*  --   CO2 33*  --   GLUCOSE 89  --   BUN 10  --   CREATININE 0.51  --   CALCIUM 9.9  --   MG 1.7  --     Recent Labs  07/25/15 0841  AST 20  ALT 24  ALKPHOS 69  BILITOT 0.4  PROT 6.6  ALBUMIN 3.7    Recent Labs  07/25/15  0841  WBC 4.7  NEUTROABS 2.8  HGB 13.0  HCT 37.2  MCV 89.9  PLT 212   Lab Results  Component Value Date   TSH 4.585 (H) 07/25/2015   Lab Results  Component Value Date   HGBA1C 5.6 06/23/2009   Lab Results  Component Value Date   CHOL 201 (H) 07/25/2015   HDL 41 07/25/2015   LDLCALC 138 (H) 07/25/2015   LDLDIRECT 161.4 12/14/2008   TRIG 108 07/25/2015   CHOLHDL 4.9 07/25/2015    Significant Diagnostic Results in last 30 days:  No results found.  Assessment/Plan 1. Hemiplegia and hemiparesis following cerebral infarction affecting left non-dominant side (HCC)  Stable  Continue Aggrenox 25-200 po Q 12 hours  2. Other sequelae of cerebral infarction  SLP Eval and Treat for raspy voice and difficulty swallowing  3. Pain of left leg  Stable  Continue Methocarbamol 500 mg po Q 6 hours prn  Continue Voltaren 1%gel- 4 grams to the left knee QID  4. Pain of left arm  Stable  Continue Methocarbamol 500 mg po Q 6 hours prn  5. Essential (primary) hypertension  Stable  Continue Amlodipine 5 mg po Q Day  Continue Carvedilol 3.125 mg po BID  Continue HCTZ 25 mg po Q Day  Continue Losartan 50 mg po BID  6. Major depressive disorder with single episode, remission status unspecified  Stable  Continue Depakote sprinkles 2 capsules po BID  Continue Venlafaxine 150 mg po Q Day  7. Hyperlipidemia, unspecified hyperlipidemia type  Stable  Monitor  8. Uncomplicated asthma, unspecified asthma severity, unspecified whether persistent  Stable  Continue Duonebs TID prn for wheezing  Continue Singulair 10 mg po Q HS  Continue  Mucinex ER 1200 mg po BID   9. Nontoxic thyroid nodule  stable  10. Flexion deformity, left ankle and toes  Stable  Being followed by Dr Elvina Mattes, podiatry   11. Contracture, left hand  Stable  Continue working with [facility]  12. Lack of coordination  Stable  Continue working with [facility]  13. Primary osteoarthritis of left shoulder  Acetaminophen 1,000 mg po TID prn  14. Gastroesophageal reflux disease without esophagitis  Stable  Continue Omeprazole 20 mg po Q Day  15. Hypothyroidism, unspecified type  Stable   16. Posttraumatic stress disorder  Continue Prazosin 3 mg po Q HS  Pt is being followed by [facility]  17. Osteopenia, unspecified location  Stable  Continue Calcium Carbonate-vitamin D3 600-400 mg 1 tablet po BID  18. Allergic rhinitis, unspecified seasonality, unspecified trigger  Stable  Continue Certirizine 5 mg po Q HS  Continue Atrovent nasal spray 0.03%- 2 sprays in eaach nostril Q HS  19. Neuropathy  Stable  Continue Gabapentin 300 mg po TID  20. Overactive bladder  Stable  Continue Oxybutynin 5 mg po BID  21. Corn of toe  Corn pad to the toe every other day  22. Dry eyes  Stable  Continue Systane 0.4-0.3%- 1 drop in each eye BID    Family/ staff Communication:   Total Time:  Documentation:  Face to Face:  Family/Phone:   Labs/tests ordered:  Cbc, met c, valproate level  Medication list reviewed and assessed for continued appropriateness. Monthly medication orders reviewed and signed.  Vikki Ports, NP-C Geriatrics [facility] [phone], [place] [address] Cell Phone (Mon-Fri 8am-5pm):  [phone] On Call:  [phone] & follow prompts after 5pm & weekends Office  Phone:  [phone] Office Fax:  [phone]

## 2016-05-30 ENCOUNTER — Other Ambulatory Visit
Admission: RE | Admit: 2016-05-30 | Discharge: 2016-05-30 | Disposition: A | Discharge status: Home / Self Care | Payer: PPO | Source: Ambulatory Visit | Attending: Internal Medicine | Admitting: Internal Medicine

## 2016-05-30 DIAGNOSIS — L821 Other seborrheic keratosis: Secondary | ICD-10-CM | POA: Diagnosis not present

## 2016-05-30 DIAGNOSIS — I10 Essential (primary) hypertension: Secondary | ICD-10-CM | POA: Insufficient documentation

## 2016-05-30 DIAGNOSIS — L820 Inflamed seborrheic keratosis: Secondary | ICD-10-CM | POA: Diagnosis not present

## 2016-05-30 DIAGNOSIS — F329 Major depressive disorder, single episode, unspecified: Secondary | ICD-10-CM | POA: Insufficient documentation

## 2016-05-30 DIAGNOSIS — L918 Other hypertrophic disorders of the skin: Secondary | ICD-10-CM | POA: Diagnosis not present

## 2016-05-30 LAB — COMPREHENSIVE METABOLIC PANEL
ALT: 13 U/L — ABNORMAL LOW (ref 14–54)
ANION GAP: 10 (ref 5–15)
AST: 20 U/L (ref 15–41)
Albumin: 3.5 g/dL (ref 3.5–5.0)
Alkaline Phosphatase: 57 U/L (ref 38–126)
BILIRUBIN TOTAL: 0.6 mg/dL (ref 0.3–1.2)
BUN: 18 mg/dL (ref 6–20)
CALCIUM: 10 mg/dL (ref 8.9–10.3)
CO2: 33 mmol/L — AB (ref 22–32)
CREATININE: 0.67 mg/dL (ref 0.44–1.00)
Chloride: 90 mmol/L — ABNORMAL LOW (ref 101–111)
Glucose, Bld: 77 mg/dL (ref 65–99)
Potassium: 3.1 mmol/L — ABNORMAL LOW (ref 3.5–5.1)
SODIUM: 133 mmol/L — AB (ref 135–145)
TOTAL PROTEIN: 6.2 g/dL — AB (ref 6.5–8.1)

## 2016-05-30 LAB — CBC WITH DIFFERENTIAL/PLATELET
Basophils Absolute: 0 10*3/uL (ref 0–0.1)
Basophils Relative: 1 %
EOS ABS: 0.2 10*3/uL (ref 0–0.7)
Eosinophils Relative: 4 %
HCT: 34.3 % — ABNORMAL LOW (ref 35.0–47.0)
HEMOGLOBIN: 11.8 g/dL — AB (ref 12.0–16.0)
LYMPHS ABS: 1.8 10*3/uL (ref 1.0–3.6)
LYMPHS PCT: 31 %
MCH: 31.1 pg (ref 26.0–34.0)
MCHC: 34.5 g/dL (ref 32.0–36.0)
MCV: 90 fL (ref 80.0–100.0)
MONOS PCT: 9 %
Monocytes Absolute: 0.5 10*3/uL (ref 0.2–0.9)
Neutro Abs: 3.3 10*3/uL (ref 1.4–6.5)
Neutrophils Relative %: 55 %
Platelets: 214 10*3/uL (ref 150–440)
RBC: 3.81 MIL/uL (ref 3.80–5.20)
RDW: 12.6 % (ref 11.5–14.5)
WBC: 5.8 10*3/uL (ref 3.6–11.0)

## 2016-05-30 LAB — VALPROIC ACID LEVEL: VALPROIC ACID LVL: 36 ug/mL — AB (ref 50.0–100.0)

## 2016-06-07 ENCOUNTER — Encounter
Admission: RE | Admit: 2016-06-07 | Discharge: 2016-06-07 | Disposition: A | Discharge status: Home / Self Care | Payer: PPO | Source: Ambulatory Visit | Attending: Internal Medicine | Admitting: Internal Medicine

## 2016-06-21 DIAGNOSIS — I69354 Hemiplegia and hemiparesis following cerebral infarction affecting left non-dominant side: Secondary | ICD-10-CM | POA: Diagnosis not present

## 2016-06-21 DIAGNOSIS — F325 Major depressive disorder, single episode, in full remission: Secondary | ICD-10-CM | POA: Diagnosis not present

## 2016-06-21 DIAGNOSIS — E7800 Pure hypercholesterolemia, unspecified: Secondary | ICD-10-CM | POA: Diagnosis not present

## 2016-06-21 DIAGNOSIS — I10 Essential (primary) hypertension: Secondary | ICD-10-CM | POA: Diagnosis not present

## 2016-07-07 ENCOUNTER — Encounter
Admission: RE | Admit: 2016-07-07 | Discharge: 2016-07-07 | Disposition: A | Discharge status: Home / Self Care | Payer: PPO | Source: Ambulatory Visit | Attending: Internal Medicine | Admitting: Internal Medicine

## 2016-07-16 DIAGNOSIS — F419 Anxiety disorder, unspecified: Secondary | ICD-10-CM | POA: Diagnosis not present

## 2016-07-16 DIAGNOSIS — F4310 Post-traumatic stress disorder, unspecified: Secondary | ICD-10-CM | POA: Diagnosis not present

## 2016-07-16 DIAGNOSIS — F39 Unspecified mood [affective] disorder: Secondary | ICD-10-CM | POA: Diagnosis not present

## 2016-07-16 DIAGNOSIS — F329 Major depressive disorder, single episode, unspecified: Secondary | ICD-10-CM | POA: Diagnosis not present

## 2016-07-24 ENCOUNTER — Non-Acute Institutional Stay (SKILLED_NURSING_FACILITY): Payer: PPO | Admitting: Gerontology

## 2016-07-24 ENCOUNTER — Encounter: Payer: Self-pay | Admitting: Gerontology

## 2016-07-24 DIAGNOSIS — J45909 Unspecified asthma, uncomplicated: Secondary | ICD-10-CM | POA: Diagnosis not present

## 2016-07-24 DIAGNOSIS — E041 Nontoxic single thyroid nodule: Secondary | ICD-10-CM

## 2016-07-24 DIAGNOSIS — I10 Essential (primary) hypertension: Secondary | ICD-10-CM

## 2016-07-24 DIAGNOSIS — I69354 Hemiplegia and hemiparesis following cerebral infarction affecting left non-dominant side: Secondary | ICD-10-CM

## 2016-07-24 DIAGNOSIS — M8580 Other specified disorders of bone density and structure, unspecified site: Secondary | ICD-10-CM

## 2016-07-24 DIAGNOSIS — M24542 Contracture, left hand: Secondary | ICD-10-CM

## 2016-07-24 DIAGNOSIS — M79605 Pain in left leg: Secondary | ICD-10-CM

## 2016-07-24 DIAGNOSIS — R279 Unspecified lack of coordination: Secondary | ICD-10-CM | POA: Diagnosis not present

## 2016-07-24 DIAGNOSIS — M19012 Primary osteoarthritis, left shoulder: Secondary | ICD-10-CM

## 2016-07-24 DIAGNOSIS — F329 Major depressive disorder, single episode, unspecified: Secondary | ICD-10-CM

## 2016-07-24 DIAGNOSIS — M21272 Flexion deformity, left ankle and toes: Secondary | ICD-10-CM | POA: Diagnosis not present

## 2016-07-24 DIAGNOSIS — K219 Gastro-esophageal reflux disease without esophagitis: Secondary | ICD-10-CM

## 2016-07-24 DIAGNOSIS — E039 Hypothyroidism, unspecified: Secondary | ICD-10-CM

## 2016-07-24 DIAGNOSIS — I69398 Other sequelae of cerebral infarction: Secondary | ICD-10-CM

## 2016-07-24 DIAGNOSIS — E785 Hyperlipidemia, unspecified: Secondary | ICD-10-CM

## 2016-07-24 DIAGNOSIS — M79602 Pain in left arm: Secondary | ICD-10-CM

## 2016-07-24 DIAGNOSIS — F4310 Post-traumatic stress disorder, unspecified: Secondary | ICD-10-CM

## 2016-07-24 DIAGNOSIS — H04123 Dry eye syndrome of bilateral lacrimal glands: Secondary | ICD-10-CM

## 2016-07-24 DIAGNOSIS — J309 Allergic rhinitis, unspecified: Secondary | ICD-10-CM

## 2016-07-24 DIAGNOSIS — L84 Corns and callosities: Secondary | ICD-10-CM

## 2016-07-24 DIAGNOSIS — G629 Polyneuropathy, unspecified: Secondary | ICD-10-CM

## 2016-07-24 DIAGNOSIS — N3281 Overactive bladder: Secondary | ICD-10-CM

## 2016-07-24 NOTE — Progress Notes (Signed)
Location:   [facility] Room Number: 213Y Place of Service:  SNF [phone]) Provider:  Toni Arthurs, NP-C  [name], MD  Patient Care Team: [name], MD as PCP - General (Family Medicine)  Extended Emergency Contact Information Primary Emergency Contact: Norm Salt Address: [address]          [name] Phone: [phone] Mobile Phone: [phone] Relation: Spouse Secondary Emergency Contact: Barry Dienes, [place] [address] Johnnette Litter of [place] Phone: [phone] Mobile Phone: [phone] Relation: Daughter  Code Status:  FULL Goals of care: Advanced Directive information Advanced Directives 07/24/2016  Does Patient Have a Medical Advance Directive? No  Type of Advance Directive -  Does patient want to make changes to medical advance directive? -  Copy of [place] in Chart? -  Would patient like information on creating a medical advance directive? -     Chief Complaint  Patient presents with  . Medical Management of Chronic Issues    Routine Visit    HPI:  Pt is a 76 y.o. female seen today for medical management of chronic diseases. Pt has been stable this past month. No new falls. Pt did begin having increased sx of PTSD and increased agitation. She was started on Prazosin for the PTSD. Pt has been on this med for several months and is doing well with it. Pt is again smiling and seems content. She is not as tearful or fearful of staff as she was. She participates in most activities and seems to enjoy being around other residents. Appetite is good. She is having regular BMs. VSS. No other complaints.    Past Medical History:  Diagnosis Date  . Anxiety   . Arthritis   . Asthma   . 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 Depression   . Depression with anxiety   . GERD (gastroesophageal reflux disease)   . Hemiparesis affecting  left side as late effect of cerebrovascular accident (CVA) ([place])   . History of CVA (cerebrovascular accident)   . Hot flashes   . Hyperlipidemia   . Hyperlipidemia, unspecified   . Hypertension   . Muscle hypertonicity   . Osteoarthritis   . Stroke ([place])   . TIA (transient ischemic attack)    Past Surgical History:  Procedure Laterality Date  . ACHILLES TENDON LENGTHENING  08/25/2015   Procedure: ACHILLES TENDON LENGTHENING;  Surgeon: Samara Deist, DPM;  Location: [facility] ORS;  Service: Podiatry;;  . APPENDECTOMY    . COLONOSCOPY  06/17/2008  . EYE SURGERY Bilateral    Cataract Extraction with IOL  . FLAT FOOT RECONSTRUCTION-TAL GASTROC RECESSION Left 08/25/2015   Procedure: ENDOSCOPIC FLAT FOOT RECONSTRUCTION-TAL GASTROC RECESSION;  Surgeon: Samara Deist, DPM;  Location: [facility] ORS;  Service: Podiatry;  Laterality: Left;  [facility] Kitchen VAGINAL HYSTERECTOMY      Allergies  Allergen Reactions  . Tizanidine Other (See Comments)    HYPOTENSION AND UNRESPONSIVENESS  . Ace Inhibitors Cough  . Latex Hives  . Lisinopril Cough  . Tetracycline Other (See Comments)    Reaction:  Unknown   . Zanaflex [Tizanidine Hcl] Other (See Comments)    "drowsy"    Allergies as of 07/24/2016      Reactions   Tizanidine Other (See Comments)   HYPOTENSION AND UNRESPONSIVENESS   Ace Inhibitors Cough   Latex Hives   Lisinopril Cough  Tetracycline Other (See Comments)   Reaction:  Unknown    Zanaflex [tizanidine Hcl] Other (See Comments)   "drowsy"      Medication List       Accurate as of 07/24/16 11:37 AM. Always use your most recent med list.          acetaminophen 500 MG tablet Commonly known as:  TYLENOL Take 500 mg by mouth every 8 (eight) hours as needed for mild pain. Maximum of 3000 mg of acetaminophen in 24 hours , consider all sources  to both as needed, plain  Apap, and as needed oxydocone apap.   amLODipine 5 MG tablet Commonly known as:  NORVASC Take 5 mg by mouth daily.   BIOTENE  MOISTURIZING MOUTH MT Use as directed 2 sprays in the mouth or throat 4 (four) times daily as needed.   CALCIUM 600+D 600-400 MG-UNIT tablet Generic drug:  Calcium Carbonate-Vitamin D Take 1 tablet by mouth 2 (two) times daily.   carvedilol 3.125 MG tablet Commonly known as:  COREG Take 3.125 mg by mouth 2 (two) times daily.   cetirizine 5 MG tablet Commonly known as:  ZYRTEC Take 5 mg by mouth at bedtime.   diclofenac sodium 1 % Gel Commonly known as:  VOLTAREN Apply 4 g topically 2 (two) times daily between meals. Apply to both knees for pain   dipyridamole-aspirin 200-25 MG 12hr capsule Commonly known as:  AGGRENOX Take 1 capsule by mouth 2 (two) times daily.   divalproex 125 MG capsule Commonly known as:  DEPAKOTE SPRINKLE Take 250 mg by mouth 2 (two) times daily. 2 caps   ENDIT EX Apply liberal amount topically to areas of skin irritation as needed. Ok to leave at bedside.   gabapentin 300 MG capsule Commonly known as:  NEURONTIN Take 300 mg by mouth 3 (three) times daily.   guaiFENesin 600 MG 12 hr tablet Commonly known as:  MUCINEX Take 1,200 mg by mouth 2 (two) times daily.   hydrochlorothiazide 25 MG tablet Commonly known as:  HYDRODIURIL Take 25 mg by mouth daily.   ipratropium 0.03 % nasal spray Commonly known as:  ATROVENT Place 2 sprays into both nostrils at bedtime.   ipratropium-albuterol 0.5-2.5 (3) MG/3ML Soln Commonly known as:  DUONEB Take 3 mLs by nebulization 3 (three) times daily as needed.   losartan 50 MG tablet Commonly known as:  COZAAR Take 50 mg by mouth 2 (two) times daily.   methocarbamol 500 MG tablet Commonly known as:  ROBAXIN Take 500 mg by mouth every 6 (six) hours as needed for muscle spasms.   montelukast 10 MG tablet Commonly known as:  SINGULAIR Take 10 mg by mouth at bedtime.   omeprazole 20 MG capsule Commonly known as:  PRILOSEC Take 20 mg by mouth every morning. 6 am   oxybutynin 5 MG tablet Commonly known  as:  DITROPAN Take 5 mg by mouth 2 (two) times daily.   potassium chloride 8 MEQ tablet Commonly known as:  KLOR-CON Take 8 mEq by mouth daily.   prazosin 1 MG capsule Commonly known as:  MINIPRESS Take 3 mg by mouth at bedtime. 3 capsules   Salicylic Acid 40 % Pads Apply 1 patch topically every other day. Apply to corn on toes. Change every other day until healed.   sennosides-docusate sodium 8.6-50 MG tablet Commonly known as:  SENOKOT-S Take 1 tablet by mouth 2 (two) times daily as needed for constipation.   sennosides-docusate sodium 8.6-50 MG tablet Commonly known  as:  SENOKOT-S Take 1 tablet by mouth every 3 (three) days.   SYSTANE PRESERVATIVE FREE 0.4-0.3 % Soln Generic drug:  Polyethyl Glyc-Propyl Glyc PF Place 2 drops into both eyes 2 (two) times daily.   venlafaxine XR 150 MG 24 hr capsule Commonly known as:  EFFEXOR-XR Take 150 mg by mouth daily with breakfast.       Review of Systems  Constitutional: Negative for activity change, appetite change, chills, diaphoresis and fever.  HENT: Negative for congestion, rhinorrhea, sinus pain, sinus pressure, sneezing, sore throat, trouble swallowing and voice change.   Eyes: Negative for pain, redness and visual disturbance.  Respiratory: Negative for apnea, choking, chest tightness, shortness of breath and wheezing.   Cardiovascular: Negative for chest pain, palpitations and leg swelling.  Gastrointestinal: Negative for abdominal distention, abdominal pain, constipation, diarrhea and nausea.  Genitourinary: Negative for difficulty urinating, dysuria, frequency and urgency.  Musculoskeletal: Negative for back pain, gait problem and myalgias. Arthralgias: typical arthritis.  Skin: Positive for wound. Negative for color change, pallor and rash.  Neurological: Negative for dizziness, tremors, syncope, speech difficulty, weakness, numbness and headaches.  Psychiatric/Behavioral: Negative for agitation and behavioral  problems.  All other systems reviewed and are negative.   Immunization History  Administered Date(s) Administered  . Influenza Split 09/30/2014  . Influenza Whole 01/08/2004, 10/08/2006, 11/22/2008  . Influenza-Unspecified 08/13/2013, 09/21/2015  . Pneumococcal Conjugate-13 08/13/2013  . Pneumococcal Polysaccharide-23 10/24/2000, 11/30/2014  . Td 01/07/2001  . Zoster 01/07/2006   Pertinent  Health Maintenance Due  Topic Date Due  . PNA vac Low Risk Adult (2 of 2 - PCV13) 11/30/2015  . INFLUENZA VACCINE  08/07/2016  . DEXA SCAN  Completed   No flowsheet data found. Functional Status Survey:    Vitals:   07/24/16 1044  BP: 126/68  Pulse: 74  Resp: 18  Temp: 98.2 F (36.8 C)  SpO2: 96%  Weight: 179 lb 9.6 oz (81.5 kg)  Height: 5' (1.524 m)   Body mass index is 35.08 kg/m. Physical Exam  Constitutional: She is oriented to person, place, and time. Vital signs are normal. She appears well-developed and well-nourished. She is active and cooperative. She does not appear ill. No distress.  HENT:  Head: Normocephalic and atraumatic.  Mouth/Throat: Uvula is midline, oropharynx is clear and moist and mucous membranes are normal. Mucous membranes are not pale, not dry and not cyanotic.  Eyes: Pupils are equal, round, and reactive to light. Conjunctivae, EOM and lids are normal. Right eye exhibits no discharge. Left eye exhibits no discharge.  Neck: Trachea normal, normal range of motion and full passive range of motion without pain. Neck supple. No JVD present. No tracheal deviation, no edema, no erythema and normal range of motion present. No thyroid mass and no thyromegaly present.  Cardiovascular: Normal rate, regular rhythm, normal heart sounds, intact distal pulses and normal pulses.  Exam reveals no gallop, no distant heart sounds and no friction rub.   No murmur heard. Pulmonary/Chest: Effort normal. No accessory muscle usage. No respiratory distress. She has no decreased  breath sounds. She has no rhonchi. She has no rales. She exhibits no tenderness.  Abdominal: Soft. Normal appearance and bowel sounds are normal. She exhibits no distension and no ascites. There is no tenderness. There is no guarding.  Musculoskeletal: Normal range of motion. She exhibits no edema, tenderness or deformity.  Expected osteoarthritis, stiffness; calves soft and supple, negative homan's sign  Lymphadenopathy:       Head (right side): No  submental, no submandibular and no tonsillar adenopathy present.       Head (left side): No submental, no submandibular and no tonsillar adenopathy present.       Right cervical: No superficial cervical adenopathy present.      Left cervical: No superficial cervical adenopathy present.  Neurological: She is alert and oriented to person, place, and time. She has normal strength.  Skin: Skin is warm, dry and intact. No rash noted. She is not diaphoretic. No cyanosis or erythema. No pallor. Nails show no clubbing.  Corn on Right 5th toe  Psychiatric: Her speech is normal. Judgment normal. Her mood appears anxious. Her affect is blunt. She is withdrawn. Thought content is paranoid and delusional. Cognition and memory are impaired. She exhibits a depressed mood.  Nursing note and vitals reviewed.   Labs reviewed:  Recent Labs  08/17/15 1036 05/30/16 0620  NA  --  133*  K 3.4* 3.1*  CL  --  90*  CO2  --  33*  GLUCOSE  --  77  BUN  --  18  CREATININE  --  0.67  CALCIUM  --  10.0    Recent Labs  05/30/16 0620  AST 20  ALT 13*  ALKPHOS 57  BILITOT 0.6  PROT 6.2*  ALBUMIN 3.5    Recent Labs  05/30/16 0620  WBC 5.8  NEUTROABS 3.3  HGB 11.8*  HCT 34.3*  MCV 90.0  PLT 214   Lab Results  Component Value Date   TSH 4.585 (H) 07/25/2015   Lab Results  Component Value Date   HGBA1C 5.6 06/23/2009   Lab Results  Component Value Date   CHOL 201 (H) 07/25/2015   HDL 41 07/25/2015   LDLCALC 138 (H) 07/25/2015   LDLDIRECT 161.4  12/14/2008   TRIG 108 07/25/2015   CHOLHDL 4.9 07/25/2015    Significant Diagnostic Results in last 30 days:  No results found.  Assessment/Plan 1. Hemiplegia and hemiparesis following cerebral infarction affecting left non-dominant side (HCC)  Stable  Continue Aggrenox 25-200 po Q 12 hours  2. Other sequelae of cerebral infarction  SLP Eval and Treat for raspy voice and difficulty swallowing  3. Pain of left leg  Stable  Continue Methocarbamol 500 mg po Q 6 hours prn  Continue Voltaren 1%gel- 4 grams to the left knee QID  4. Pain of left arm  Stable  Continue Methocarbamol 500 mg po Q 6 hours prn  5. Essential (primary) hypertension  Stable  Continue Amlodipine 5 mg po Q Day  Continue Carvedilol 3.125 mg po BID  Continue HCTZ 25 mg po Q Day  Continue Losartan 50 mg po BID  6. Major depressive disorder with single episode, remission status unspecified  Stable  Continue Depakote sprinkles 2 capsules po BID  Continue Venlafaxine 150 mg po Q Day  7. Hyperlipidemia, unspecified hyperlipidemia type  Stable  Monitor  8. Uncomplicated asthma, unspecified asthma severity, unspecified whether persistent  Stable  Continue Duonebs TID prn for wheezing  Continue Singulair 10 mg po Q HS  Continue Mucinex ER 1200 mg po BID   9. Nontoxic thyroid nodule  stable  10. Flexion deformity, left ankle and toes  Stable  Being followed by Dr Elvina Mattes, podiatry   11. Contracture, left hand  Stable  Continue working with [facility]  12. Lack of coordination  Stable  Continue working with [facility]  13. Primary osteoarthritis of left shoulder  Acetaminophen 1,000 mg po TID prn  14. Gastroesophageal  reflux disease without esophagitis  Stable  Continue Omeprazole 20 mg po Q Day  15. Hypothyroidism, unspecified type  Stable   16. Posttraumatic stress disorder  Continue Prazosin 3 mg po Q HS  Pt is being followed by [facility]  17. Osteopenia, unspecified location  Stable  Continue Calcium Carbonate-vitamin D3 600-400 mg 1 tablet po BID  18. Allergic rhinitis, unspecified seasonality, unspecified trigger  Stable  Continue Certirizine 5 mg po Q HS  Continue Atrovent nasal spray 0.03%- 2 sprays in eaach nostril Q HS  19. Neuropathy  Stable  Continue Gabapentin 300 mg po TID  20. Overactive bladder  Stable  Continue Oxybutynin 5 mg po BID  21. Corn of toe  Corn pad to the toe every other day  22. Dry eyes  Stable  Continue Systane 0.4-0.3%- 1 drop in each eye BID    Family/ staff Communication:   Total Time:  Documentation:  Face to Face:  Family/Phone:   Labs/tests ordered:  Cbc, met c, valproate level  Medication list reviewed and assessed for continued appropriateness. Monthly medication orders reviewed and signed.  Vikki Ports, NP-C Geriatrics [facility] [phone], [place] [address] Cell Phone (Mon-Fri 8am-5pm):  [phone] On Call:  [phone] & follow prompts after 5pm & weekends Office Phone:  [phone] Office Fax:  [phone]

## 2016-08-01 DIAGNOSIS — L821 Other seborrheic keratosis: Secondary | ICD-10-CM | POA: Diagnosis not present

## 2016-08-01 DIAGNOSIS — D485 Neoplasm of uncertain behavior of skin: Secondary | ICD-10-CM | POA: Diagnosis not present

## 2016-08-01 DIAGNOSIS — L820 Inflamed seborrheic keratosis: Secondary | ICD-10-CM | POA: Diagnosis not present

## 2016-08-07 ENCOUNTER — Encounter
Admission: RE | Admit: 2016-08-07 | Discharge: 2016-08-07 | Disposition: A | Discharge status: Home / Self Care | Payer: PPO | Source: Ambulatory Visit | Attending: Internal Medicine | Admitting: Internal Medicine

## 2016-09-02 ENCOUNTER — Non-Acute Institutional Stay (SKILLED_NURSING_FACILITY): Payer: PPO | Admitting: Gerontology

## 2016-09-02 ENCOUNTER — Encounter: Payer: Self-pay | Admitting: Gerontology

## 2016-09-02 DIAGNOSIS — I69354 Hemiplegia and hemiparesis following cerebral infarction affecting left non-dominant side: Secondary | ICD-10-CM | POA: Diagnosis not present

## 2016-09-02 DIAGNOSIS — E041 Nontoxic single thyroid nodule: Secondary | ICD-10-CM | POA: Diagnosis not present

## 2016-09-02 DIAGNOSIS — I69398 Other sequelae of cerebral infarction: Secondary | ICD-10-CM

## 2016-09-02 DIAGNOSIS — K219 Gastro-esophageal reflux disease without esophagitis: Secondary | ICD-10-CM

## 2016-09-02 DIAGNOSIS — M24542 Contracture, left hand: Secondary | ICD-10-CM

## 2016-09-02 DIAGNOSIS — M79605 Pain in left leg: Secondary | ICD-10-CM

## 2016-09-02 DIAGNOSIS — I10 Essential (primary) hypertension: Secondary | ICD-10-CM

## 2016-09-02 DIAGNOSIS — G629 Polyneuropathy, unspecified: Secondary | ICD-10-CM

## 2016-09-02 DIAGNOSIS — M21272 Flexion deformity, left ankle and toes: Secondary | ICD-10-CM | POA: Diagnosis not present

## 2016-09-02 DIAGNOSIS — M19012 Primary osteoarthritis, left shoulder: Secondary | ICD-10-CM

## 2016-09-02 DIAGNOSIS — R279 Unspecified lack of coordination: Secondary | ICD-10-CM | POA: Diagnosis not present

## 2016-09-02 DIAGNOSIS — M8580 Other specified disorders of bone density and structure, unspecified site: Secondary | ICD-10-CM

## 2016-09-02 DIAGNOSIS — E785 Hyperlipidemia, unspecified: Secondary | ICD-10-CM

## 2016-09-02 DIAGNOSIS — F329 Major depressive disorder, single episode, unspecified: Secondary | ICD-10-CM

## 2016-09-02 DIAGNOSIS — N3281 Overactive bladder: Secondary | ICD-10-CM

## 2016-09-02 DIAGNOSIS — L84 Corns and callosities: Secondary | ICD-10-CM

## 2016-09-02 DIAGNOSIS — E039 Hypothyroidism, unspecified: Secondary | ICD-10-CM

## 2016-09-02 DIAGNOSIS — J45909 Unspecified asthma, uncomplicated: Secondary | ICD-10-CM | POA: Diagnosis not present

## 2016-09-02 DIAGNOSIS — J309 Allergic rhinitis, unspecified: Secondary | ICD-10-CM

## 2016-09-02 DIAGNOSIS — M79602 Pain in left arm: Secondary | ICD-10-CM

## 2016-09-02 DIAGNOSIS — H04123 Dry eye syndrome of bilateral lacrimal glands: Secondary | ICD-10-CM

## 2016-09-02 DIAGNOSIS — F4310 Post-traumatic stress disorder, unspecified: Secondary | ICD-10-CM

## 2016-09-02 NOTE — Progress Notes (Signed)
Location:   [facility] Room Number: 371I Place of Service:  SNF [phone]) Provider:  Toni Arthurs, NP-C  [name], MD  Patient Care Team: [name], MD as PCP - General (Family Medicine)  Extended Emergency Contact Information Primary Emergency Contact: Norm Salt Address: [address]          [name] Phone: [phone] Mobile Phone: [phone] Relation: Spouse Secondary Emergency Contact: Barry Dienes, [place] [address] [place] of [place] Phone: [phone] Mobile Phone: [phone] Relation: Daughter  Code Status:  FULL Goals of care: Advanced Directive information Advanced Directives 09/02/2016  Does Patient Have a Medical Advance Directive? No  Type of Advance Directive -  Does patient want to make changes to medical advance directive? -  Copy of [place] in Chart? -  Would patient like information on creating a medical advance directive? -     Chief Complaint  Patient presents with  . Medical Management of Chronic Issues    Routine Visit    HPI:  Pt is a 76 y.o. female seen today for medical management of chronic diseases. Pt has been stable this past month. No new falls. Pt did begin having increased sx of PTSD and increased agitation. She was started on Prazosin for the PTSD. Pt has been on this med for several months and is doing well with it. Pt is again smiling and seems content. She is not as tearful or fearful of staff as she was. She is now requesting that th e medication be decreased as she feels it is making her "jumpy" at night. [facility] visited her and told her she felt  It was not the medication causing this but an increase in her symptoms. However, TH agreed on a trial reduction as the pt's request. TH to f/u with pt at their next visit to re-assess. She participates in most activities and seems to enjoy being around  other residents. Appetite is good. She is having regular BMs. VSS. No other complaints.    Past Medical History:  Diagnosis Date  . Anxiety   . Arthritis   . Asthma   . 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 Depression   . Depression with anxiety   . GERD (gastroesophageal reflux disease)   . Hemiparesis affecting left side as late effect of cerebrovascular accident (CVA) ([facility])   . History of CVA (cerebrovascular accident)   . Hot flashes   . Hyperlipidemia   . Hyperlipidemia, unspecified   . Hypertension   . Muscle hypertonicity   . Osteoarthritis   . Stroke ([place])   . TIA (transient ischemic attack)    Past Surgical History:  Procedure Laterality Date  . ACHILLES TENDON LENGTHENING  08/25/2015   Procedure: ACHILLES TENDON LENGTHENING;  Surgeon: Samara Deist, DPM;  Location: [facility] ORS;  Service: Podiatry;;  . APPENDECTOMY    . COLONOSCOPY  06/17/2008  . EYE SURGERY Bilateral    Cataract Extraction with IOL  . FLAT FOOT RECONSTRUCTION-TAL GASTROC RECESSION Left 08/25/2015   Procedure: ENDOSCOPIC FLAT FOOT RECONSTRUCTION-TAL GASTROC RECESSION;  Surgeon: Samara Deist, DPM;  Location: [facility] ORS;  Service: Podiatry;  Laterality: Left;  Marland Kitchen VAGINAL HYSTERECTOMY      Allergies  Allergen Reactions  . Tizanidine Other (See Comments)    HYPOTENSION AND UNRESPONSIVENESS  . Ace Inhibitors Cough  . Latex Hives  .  Lisinopril Cough  . Tetracycline Other (See Comments)    Reaction:  Unknown   . Zanaflex [Tizanidine Hcl] Other (See Comments)    "drowsy"    Allergies as of 09/02/2016      Reactions   Tizanidine Other (See Comments)   HYPOTENSION AND UNRESPONSIVENESS   Ace Inhibitors Cough   Latex Hives   Lisinopril Cough   Tetracycline Other (See Comments)   Reaction:  Unknown    Zanaflex [tizanidine Hcl] Other (See Comments)   "drowsy"      Medication List       Accurate as of 09/02/16  9:51 AM. Always use your most recent med list.          acetaminophen 500  MG tablet Commonly known as:  TYLENOL Take 1,000 mg by mouth every 8 (eight) hours as needed for mild pain. Maximum of 3000 mg of acetaminophen in 24 hours , consider all sources  to both as needed, plain  Apap, and as needed oxydocone apap.   amLODipine 5 MG tablet Commonly known as:  NORVASC Take 5 mg by mouth daily.   BIOTENE MOISTURIZING MOUTH MT Use as directed 2 sprays in the mouth or throat 4 (four) times daily as needed.   CALCIUM 600+D 600-400 MG-UNIT tablet Generic drug:  Calcium Carbonate-Vitamin D Take 1 tablet by mouth 2 (two) times daily.   carvedilol 3.125 MG tablet Commonly known as:  COREG Take 3.125 mg by mouth 2 (two) times daily.   cetirizine 5 MG tablet Commonly known as:  ZYRTEC Take 5 mg by mouth at bedtime.   diclofenac sodium 1 % Gel Commonly known as:  VOLTAREN Apply 4 g topically 2 (two) times daily between meals. Apply to both knees for pain   dipyridamole-aspirin 200-25 MG 12hr capsule Commonly known as:  AGGRENOX Take 1 capsule by mouth 2 (two) times daily.   divalproex 125 MG capsule Commonly known as:  DEPAKOTE SPRINKLE Take 250 mg by mouth 2 (two) times daily. 2 caps   ENDIT EX Apply liberal amount topically to areas of skin irritation as needed. Ok to leave at bedside.   gabapentin 300 MG capsule Commonly known as:  NEURONTIN Take 300 mg by mouth 3 (three) times daily.   guaiFENesin 600 MG 12 hr tablet Commonly known as:  MUCINEX Take 1,200 mg by mouth 2 (two) times daily.   hydrochlorothiazide 25 MG tablet Commonly known as:  HYDRODIURIL Take 25 mg by mouth daily.   ipratropium 0.03 % nasal spray Commonly known as:  ATROVENT Place 2 sprays into both nostrils at bedtime.   ipratropium-albuterol 0.5-2.5 (3) MG/3ML Soln Commonly known as:  DUONEB Take 3 mLs by nebulization 3 (three) times daily as needed.   losartan 50 MG tablet Commonly known as:  COZAAR Take 50 mg by mouth 2 (two) times daily.   methocarbamol 500 MG  tablet Commonly known as:  ROBAXIN Take 500 mg by mouth every 6 (six) hours as needed for muscle spasms.   montelukast 10 MG tablet Commonly known as:  SINGULAIR Take 10 mg by mouth at bedtime.   omeprazole 20 MG capsule Commonly known as:  PRILOSEC Take 20 mg by mouth every morning. 6 am   oxybutynin 5 MG tablet Commonly known as:  DITROPAN Take 5 mg by mouth 2 (two) times daily.   potassium chloride 8 MEQ tablet Commonly known as:  KLOR-CON Take 8 mEq by mouth daily.   prazosin 1 MG capsule Commonly known as:  MINIPRESS Take  3 mg by mouth at bedtime. 3 capsules   Salicylic Acid 40 % Pads Apply 1 patch topically every other day. Apply to corn on toes. Change every other day until healed.   sennosides-docusate sodium 8.6-50 MG tablet Commonly known as:  SENOKOT-S Take 1 tablet by mouth 2 (two) times daily as needed for constipation.   sennosides-docusate sodium 8.6-50 MG tablet Commonly known as:  SENOKOT-S Take 1 tablet by mouth every 3 (three) days.   SYSTANE PRESERVATIVE FREE 0.4-0.3 % Soln Generic drug:  Polyethyl Glyc-Propyl Glyc PF Place 2 drops into both eyes 2 (two) times daily.   venlafaxine XR 150 MG 24 hr capsule Commonly known as:  EFFEXOR-XR Take 150 mg by mouth daily with breakfast.       Review of Systems  Constitutional: Negative for activity change, appetite change, chills, diaphoresis and fever.  HENT: Negative for congestion, rhinorrhea, sinus pain, sinus pressure, sneezing, sore throat, trouble swallowing and voice change.   Eyes: Negative for pain, redness and visual disturbance.  Respiratory: Negative for apnea, choking, chest tightness, shortness of breath and wheezing.   Cardiovascular: Negative for chest pain, palpitations and leg swelling.  Gastrointestinal: Negative for abdominal distention, abdominal pain, constipation, diarrhea and nausea.  Genitourinary: Negative for difficulty urinating, dysuria, frequency and urgency.    Musculoskeletal: Negative for back pain, gait problem and myalgias. Arthralgias: typical arthritis.  Skin: Positive for wound. Negative for color change, pallor and rash.  Neurological: Negative for dizziness, tremors, syncope, speech difficulty, weakness, numbness and headaches.  Psychiatric/Behavioral: Negative for agitation and behavioral problems.  All other systems reviewed and are negative.   Immunization History  Administered Date(s) Administered  . Influenza Split 09/30/2014  . Influenza Whole 01/08/2004, 10/08/2006, 11/22/2008  . Influenza-Unspecified 08/13/2013, 09/21/2015  . Pneumococcal Conjugate-13 08/13/2013  . Pneumococcal Polysaccharide-23 10/24/2000, 11/30/2014  . Td 01/07/2001  . Zoster 01/07/2006   Pertinent  Health Maintenance Due  Topic Date Due  . INFLUENZA VACCINE  08/07/2016  . DEXA SCAN  Completed  . PNA vac Low Risk Adult  Completed   No flowsheet data found. Functional Status Survey:    Vitals:   09/02/16 0925  BP: 135/65  Pulse: 80  Resp: 18  Temp: 98.4 F (36.9 C)  SpO2: 96%  Weight: 181 lb 1.6 oz (82.1 kg)  Height: 5' (1.524 m)   Body mass index is 35.37 kg/m. Physical Exam  Constitutional: She is oriented to person, place, and time. Vital signs are normal. She appears well-developed and well-nourished. She is active and cooperative. She does not appear ill. No distress.  HENT:  Head: Normocephalic and atraumatic.  Mouth/Throat: Uvula is midline, oropharynx is clear and moist and mucous membranes are normal. Mucous membranes are not pale, not dry and not cyanotic.  Eyes: Pupils are equal, round, and reactive to light. Conjunctivae, EOM and lids are normal. Right eye exhibits no discharge. Left eye exhibits no discharge.  Neck: Trachea normal, normal range of motion and full passive range of motion without pain. Neck supple. No JVD present. No tracheal deviation, no edema, no erythema and normal range of motion present. No thyroid mass and  no thyromegaly present.  Cardiovascular: Normal rate, regular rhythm, normal heart sounds, intact distal pulses and normal pulses.  Exam reveals no gallop, no distant heart sounds and no friction rub.   No murmur heard. Pulmonary/Chest: Effort normal. No accessory muscle usage. No respiratory distress. She has no decreased breath sounds. She has no rhonchi. She has no rales. She  exhibits no tenderness.  Abdominal: Soft. Normal appearance and bowel sounds are normal. She exhibits no distension and no ascites. There is no tenderness. There is no guarding.  Musculoskeletal: Normal range of motion. She exhibits no edema, tenderness or deformity.  Expected osteoarthritis, stiffness; calves soft and supple, negative homan's sign  Lymphadenopathy:       Head (right side): No submental, no submandibular and no tonsillar adenopathy present.       Head (left side): No submental, no submandibular and no tonsillar adenopathy present.       Right cervical: No superficial cervical adenopathy present.      Left cervical: No superficial cervical adenopathy present.  Neurological: She is alert and oriented to person, place, and time. She has normal strength.  Skin: Skin is warm, dry and intact. No rash noted. She is not diaphoretic. No cyanosis or erythema. No pallor. Nails show no clubbing.  Corn on Right 5th toe  Psychiatric: Her speech is normal. Judgment normal. Her mood appears anxious. Her affect is blunt. She is withdrawn. Thought content is paranoid and delusional. Cognition and memory are impaired. She exhibits a depressed mood.  Nursing note and vitals reviewed.   Labs reviewed:  Recent Labs  05/30/16 0620  NA 133*  K 3.1*  CL 90*  CO2 33*  GLUCOSE 77  BUN 18  CREATININE 0.67  CALCIUM 10.0    Recent Labs  05/30/16 0620  AST 20  ALT 13*  ALKPHOS 57  BILITOT 0.6  PROT 6.2*  ALBUMIN 3.5    Recent Labs  05/30/16 0620  WBC 5.8  NEUTROABS 3.3  HGB 11.8*  HCT 34.3*  MCV 90.0    PLT 214   Lab Results  Component Value Date   TSH 4.585 (H) 07/25/2015   Lab Results  Component Value Date   HGBA1C 5.6 06/23/2009   Lab Results  Component Value Date   CHOL 201 (H) 07/25/2015   HDL 41 07/25/2015   LDLCALC 138 (H) 07/25/2015   LDLDIRECT 161.4 12/14/2008   TRIG 108 07/25/2015   CHOLHDL 4.9 07/25/2015    Significant Diagnostic Results in last 30 days:  No results found.  Assessment/Plan 1. Hemiplegia and hemiparesis following cerebral infarction affecting left non-dominant side (HCC)  Stable  Continue Aggrenox 25-200 po Q 12 hours  2. Other sequelae of cerebral infarction  Stable  3. Pain of left leg  Stable  Continue Methocarbamol 500 mg po Q 6 hours prn  Continue Voltaren 1%gel- 4 grams to the left knee QID  4. Pain of left arm  Stable  Continue Methocarbamol 500 mg po Q 6 hours prn  5. Essential (primary) hypertension  Stable  Continue Amlodipine 5 mg po Q Day  Continue Carvedilol 3.125 mg po BID  Continue HCTZ 25 mg po Q Day  Continue Losartan 50 mg po BID  6. Major depressive disorder with single episode, remission status unspecified  Stable  Continue Depakote sprinkles 2 capsules po BID  Continue Venlafaxine 150 mg po Q Day  7. Hyperlipidemia, unspecified hyperlipidemia type  Stable  Monitor  8. Uncomplicated asthma, unspecified asthma severity, unspecified whether persistent  Stable  Continue Duonebs TID prn for wheezing  Continue Singulair 10 mg po Q HS  Continue Mucinex ER 1200 mg po BID   9. Nontoxic thyroid nodule  stable  10. Flexion deformity, left ankle and toes  Stable  Being followed by Dr Elvina Mattes, podiatry   11. Contracture, left hand  Stable  Continue working with  [facility]  12. Lack of coordination  Stable  Continue working with [facility]  13. Primary osteoarthritis of left shoulder  Stable  Acetaminophen 1,000 mg po TID prn  14. Gastroesophageal  reflux disease without esophagitis  Stable  Continue Omeprazole 20 mg po Q Day  15. Hypothyroidism, unspecified type  Stable   16. Posttraumatic stress disorder  Continue Prazosin 2 mg po Q HS  Pt is being followed by [facility]  17. Osteopenia, unspecified location  Stable  Continue Calcium Carbonate-vitamin D3 600-400 mg 1 tablet po BID  18. Allergic rhinitis, unspecified seasonality, unspecified trigger  Stable  Continue Certirizine 5 mg po Q HS  Continue Atrovent nasal spray 0.03%- 2 sprays in eaach nostril Q HS  19. Neuropathy  Stable  Continue Gabapentin 300 mg po TID  20. Overactive bladder  Stable  Continue Oxybutynin 5 mg po BID  21. Corn of toe  Corn pad to the toe every other day  22. Dry eyes  Stable  Continue Systane 0.4-0.3%- 1 drop in each eye BID    Family/ staff Communication:   Total Time:  Documentation:  Face to Face:  Family/Phone:   Labs/tests ordered:    Medication list reviewed and assessed for continued appropriateness. Monthly medication orders reviewed and signed.  Vikki Ports, NP-C Geriatrics [facility] [phone], [place] [address] Cell Phone (Mon-Fri 8am-5pm):  [phone] On Call:  [phone] & follow prompts after 5pm & weekends Office Phone:  [phone] Office Fax:  [phone]

## 2016-09-03 ENCOUNTER — Other Ambulatory Visit
Admission: RE | Admit: 2016-09-03 | Discharge: 2016-09-03 | Disposition: A | Discharge status: Home / Self Care | Payer: PPO | Source: Ambulatory Visit | Attending: Gerontology | Admitting: Gerontology

## 2016-09-03 DIAGNOSIS — D649 Anemia, unspecified: Secondary | ICD-10-CM | POA: Diagnosis not present

## 2016-09-03 DIAGNOSIS — F39 Unspecified mood [affective] disorder: Secondary | ICD-10-CM | POA: Diagnosis not present

## 2016-09-03 DIAGNOSIS — E876 Hypokalemia: Secondary | ICD-10-CM | POA: Diagnosis not present

## 2016-09-03 DIAGNOSIS — F419 Anxiety disorder, unspecified: Secondary | ICD-10-CM | POA: Diagnosis not present

## 2016-09-03 DIAGNOSIS — F329 Major depressive disorder, single episode, unspecified: Secondary | ICD-10-CM | POA: Diagnosis not present

## 2016-09-03 DIAGNOSIS — F4310 Post-traumatic stress disorder, unspecified: Secondary | ICD-10-CM | POA: Diagnosis not present

## 2016-09-03 DIAGNOSIS — K219 Gastro-esophageal reflux disease without esophagitis: Secondary | ICD-10-CM | POA: Insufficient documentation

## 2016-09-03 LAB — TSH: TSH: 6.98 u[IU]/mL — AB (ref 0.350–4.500)

## 2016-09-03 LAB — COMPREHENSIVE METABOLIC PANEL
ALT: 10 U/L — ABNORMAL LOW (ref 14–54)
AST: 16 U/L (ref 15–41)
Albumin: 3.4 g/dL — ABNORMAL LOW (ref 3.5–5.0)
Alkaline Phosphatase: 47 U/L (ref 38–126)
Anion gap: 9 (ref 5–15)
BUN: 17 mg/dL (ref 6–20)
CO2: 32 mmol/L (ref 22–32)
Calcium: 9.5 mg/dL (ref 8.9–10.3)
Chloride: 87 mmol/L — ABNORMAL LOW (ref 101–111)
Creatinine, Ser: 0.92 mg/dL (ref 0.44–1.00)
GFR calc Af Amer: >60 mL/min (ref >60)
GFR, EST NON AFRICAN AMERICAN: 59 mL/min — AB (ref >60)
Glucose, Bld: 94 mg/dL (ref 65–99)
Potassium: 3.4 mmol/L — ABNORMAL LOW (ref 3.5–5.1)
Sodium: 128 mmol/L — ABNORMAL LOW (ref 135–145)
Total Bilirubin: 0.4 mg/dL (ref 0.3–1.2)
Total Protein: 6.1 g/dL — ABNORMAL LOW (ref 6.5–8.1)

## 2016-09-03 LAB — CBC WITH DIFFERENTIAL/PLATELET
BASOS ABS: 0 10*3/uL (ref 0–0.1)
Basophils Relative: 1 %
EOS ABS: 0.2 10*3/uL (ref 0–0.7)
EOS PCT: 4 %
HCT: 33.4 % — ABNORMAL LOW (ref 35.0–47.0)
Hemoglobin: 11.7 g/dL — ABNORMAL LOW (ref 12.0–16.0)
Lymphocytes Relative: 31 %
Lymphs Abs: 1.9 10*3/uL (ref 1.0–3.6)
MCH: 31.9 pg (ref 26.0–34.0)
MCHC: 35.1 g/dL (ref 32.0–36.0)
MCV: 90.7 fL (ref 80.0–100.0)
Monocytes Absolute: 0.6 10*3/uL (ref 0.2–0.9)
Monocytes Relative: 10 %
Neutro Abs: 3.3 10*3/uL (ref 1.4–6.5)
Neutrophils Relative %: 54 %
Platelets: 208 10*3/uL (ref 150–440)
RBC: 3.68 MIL/uL — AB (ref 3.80–5.20)
RDW: 12.6 % (ref 11.5–14.5)
WBC: 6.1 10*3/uL (ref 3.6–11.0)

## 2016-09-03 LAB — VITAMIN B12: VITAMIN B 12: 670 pg/mL (ref 180–914)

## 2016-09-03 LAB — MAGNESIUM: MAGNESIUM: 1.8 mg/dL (ref 1.7–2.4)

## 2016-09-04 LAB — VITAMIN D 25 HYDROXY (VIT D DEFICIENCY, FRACTURES): VIT D 25 HYDROXY: 50.6 ng/mL (ref 30.0–100.0)

## 2016-09-07 ENCOUNTER — Encounter
Admission: RE | Admit: 2016-09-07 | Discharge: 2016-09-07 | Disposition: A | Discharge status: Home / Self Care | Payer: PPO | Source: Ambulatory Visit | Attending: Internal Medicine | Admitting: Internal Medicine

## 2016-09-10 ENCOUNTER — Other Ambulatory Visit
Admission: RE | Admit: 2016-09-10 | Discharge: 2016-09-10 | Disposition: A | Discharge status: Home / Self Care | Payer: PPO | Source: Ambulatory Visit | Attending: Gerontology | Admitting: Gerontology

## 2016-09-10 DIAGNOSIS — I10 Essential (primary) hypertension: Secondary | ICD-10-CM | POA: Diagnosis not present

## 2016-09-10 LAB — BASIC METABOLIC PANEL
Anion gap: 6 (ref 5–15)
BUN: 13 mg/dL (ref 6–20)
CHLORIDE: 98 mmol/L — AB (ref 101–111)
CO2: 29 mmol/L (ref 22–32)
CREATININE: 0.55 mg/dL (ref 0.44–1.00)
Calcium: 9.4 mg/dL (ref 8.9–10.3)
GFR calc Af Amer: >60 mL/min (ref >60)
GFR calc non Af Amer: >60 mL/min (ref >60)
GLUCOSE: 80 mg/dL (ref 65–99)
POTASSIUM: 3.8 mmol/L (ref 3.5–5.1)
Sodium: 133 mmol/L — ABNORMAL LOW (ref 135–145)

## 2016-09-17 DIAGNOSIS — I69354 Hemiplegia and hemiparesis following cerebral infarction affecting left non-dominant side: Secondary | ICD-10-CM | POA: Diagnosis not present

## 2016-09-17 DIAGNOSIS — F325 Major depressive disorder, single episode, in full remission: Secondary | ICD-10-CM | POA: Diagnosis not present

## 2016-09-17 DIAGNOSIS — E871 Hypo-osmolality and hyponatremia: Secondary | ICD-10-CM | POA: Diagnosis not present

## 2016-09-17 DIAGNOSIS — Z0000 Encounter for general adult medical examination without abnormal findings: Secondary | ICD-10-CM | POA: Diagnosis not present

## 2016-09-17 DIAGNOSIS — G609 Hereditary and idiopathic neuropathy, unspecified: Secondary | ICD-10-CM | POA: Diagnosis not present

## 2016-09-17 DIAGNOSIS — I10 Essential (primary) hypertension: Secondary | ICD-10-CM | POA: Diagnosis not present

## 2016-09-30 ENCOUNTER — Non-Acute Institutional Stay (SKILLED_NURSING_FACILITY): Payer: PPO | Admitting: Gerontology

## 2016-09-30 ENCOUNTER — Encounter: Payer: Self-pay | Admitting: Gerontology

## 2016-09-30 DIAGNOSIS — J45909 Unspecified asthma, uncomplicated: Secondary | ICD-10-CM | POA: Diagnosis not present

## 2016-09-30 DIAGNOSIS — M79602 Pain in left arm: Secondary | ICD-10-CM | POA: Diagnosis not present

## 2016-09-30 DIAGNOSIS — M21272 Flexion deformity, left ankle and toes: Secondary | ICD-10-CM | POA: Diagnosis not present

## 2016-09-30 DIAGNOSIS — F4310 Post-traumatic stress disorder, unspecified: Secondary | ICD-10-CM

## 2016-09-30 DIAGNOSIS — I69398 Other sequelae of cerebral infarction: Secondary | ICD-10-CM

## 2016-09-30 DIAGNOSIS — I69354 Hemiplegia and hemiparesis following cerebral infarction affecting left non-dominant side: Secondary | ICD-10-CM

## 2016-09-30 DIAGNOSIS — E041 Nontoxic single thyroid nodule: Secondary | ICD-10-CM | POA: Diagnosis not present

## 2016-09-30 DIAGNOSIS — F329 Major depressive disorder, single episode, unspecified: Secondary | ICD-10-CM | POA: Diagnosis not present

## 2016-09-30 DIAGNOSIS — J309 Allergic rhinitis, unspecified: Secondary | ICD-10-CM

## 2016-09-30 DIAGNOSIS — E785 Hyperlipidemia, unspecified: Secondary | ICD-10-CM

## 2016-09-30 DIAGNOSIS — M8580 Other specified disorders of bone density and structure, unspecified site: Secondary | ICD-10-CM

## 2016-09-30 DIAGNOSIS — I10 Essential (primary) hypertension: Secondary | ICD-10-CM | POA: Diagnosis not present

## 2016-09-30 DIAGNOSIS — N3281 Overactive bladder: Secondary | ICD-10-CM

## 2016-09-30 DIAGNOSIS — M79605 Pain in left leg: Secondary | ICD-10-CM

## 2016-09-30 DIAGNOSIS — R279 Unspecified lack of coordination: Secondary | ICD-10-CM

## 2016-09-30 DIAGNOSIS — M19012 Primary osteoarthritis, left shoulder: Secondary | ICD-10-CM

## 2016-09-30 DIAGNOSIS — K219 Gastro-esophageal reflux disease without esophagitis: Secondary | ICD-10-CM

## 2016-09-30 DIAGNOSIS — E039 Hypothyroidism, unspecified: Secondary | ICD-10-CM

## 2016-09-30 DIAGNOSIS — M24542 Contracture, left hand: Secondary | ICD-10-CM

## 2016-09-30 DIAGNOSIS — G629 Polyneuropathy, unspecified: Secondary | ICD-10-CM

## 2016-09-30 DIAGNOSIS — H04123 Dry eye syndrome of bilateral lacrimal glands: Secondary | ICD-10-CM

## 2016-09-30 DIAGNOSIS — L84 Corns and callosities: Secondary | ICD-10-CM

## 2016-09-30 NOTE — Progress Notes (Signed)
Location:   [facility] Room Number: 563J Place of Service:  SNF [phone]) Provider:  Toni Arthurs, NP-C  [name], MD  Patient Care Team: [name], MD as PCP - General (Family Medicine)  Extended Emergency Contact Information Primary Emergency Contact: Norm Salt Address: [address]          [name] Phone: [phone] Mobile Phone: [phone] Relation: Spouse Secondary Emergency Contact: Barry Dienes, [place] [address] [place] of [place] Phone: [phone] Mobile Phone: [phone] Relation: Daughter  Code Status:  FULL Goals of care: Advanced Directive information Advanced Directives 09/30/2016  Does Patient Have a Medical Advance Directive? No  Type of Advance Directive -  Does patient want to make changes to medical advance directive? -  Copy of [place] in Chart? -  Would patient like information on creating a medical advance directive? -     Chief Complaint  Patient presents with  . Medical Management of Chronic Issues    Routine Visit    HPI:  Pt is a 76 y.o. female seen today for medical management of chronic diseases. Pt has been stable this past month. Pt did have assisted fall this past month while staff was assisting pt to stand/pivot to the toilet. No injury, no residual issues. Pt did begin having increased sx of PTSD and increased agitation. She was started on Prazosin for the PTSD. Pt has been on this med for several months and is doing well with it. Pt is again smiling and seems content. She is not as tearful or fearful of staff as she was. Last month, pt requested trial reduction of Prazosin. Now, pt c/o return of "jumpiness" and staff reports increased paranoia. Pt reports increased depression. Pt agreeable to going back up on dose. Pt also c/o increased pain/ache in BLE. Intermittent. Worse with touch of the legs. She  participates in most activities and seems to enjoy being around other residents. Appetite is good. She is having regular BMs. VSS. No other complaints.    Past Medical History:  Diagnosis Date  . Anxiety   . Arthritis   . Asthma   . 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 Depression   . Depression with anxiety   . GERD (gastroesophageal reflux disease)   . Hemiparesis affecting left side as late effect of cerebrovascular accident (CVA) ([place])   . History of CVA (cerebrovascular accident)   . Hot flashes   . Hyperlipidemia   . Hyperlipidemia, unspecified   . Hypertension   . Muscle hypertonicity   . Osteoarthritis   . Stroke ([place])   . TIA (transient ischemic attack)    Past Surgical History:  Procedure Laterality Date  . ACHILLES TENDON LENGTHENING  08/25/2015   Procedure: ACHILLES TENDON LENGTHENING;  Surgeon: Samara Deist, DPM;  Location: [facility] ORS;  Service: Podiatry;;  . APPENDECTOMY    . COLONOSCOPY  06/17/2008  . EYE SURGERY Bilateral    Cataract Extraction with IOL  . FLAT FOOT RECONSTRUCTION-TAL GASTROC RECESSION Left 08/25/2015   Procedure: ENDOSCOPIC FLAT FOOT RECONSTRUCTION-TAL GASTROC RECESSION;  Surgeon: Samara Deist, DPM;  Location: [facility] ORS;  Service: Podiatry;  Laterality: Left;  [facility] Kitchen VAGINAL HYSTERECTOMY      Allergies  Allergen Reactions  . Tizanidine Other (See Comments)    HYPOTENSION AND UNRESPONSIVENESS  . Ace Inhibitors Cough  . Latex Hives  .  Lisinopril Cough  . Tetracycline Other (See Comments)    Reaction:  Unknown   . Zanaflex [Tizanidine Hcl] Other (See Comments)    "drowsy"    Allergies as of 09/30/2016      Reactions   Tizanidine Other (See Comments)   HYPOTENSION AND UNRESPONSIVENESS   Ace Inhibitors Cough   Latex Hives   Lisinopril Cough   Tetracycline Other (See Comments)   Reaction:  Unknown    Zanaflex [tizanidine Hcl] Other (See Comments)   "drowsy"      Medication List       Accurate as of 09/30/16  9:35 AM. Always  use your most recent med list.          acetaminophen 500 MG tablet Commonly known as:  TYLENOL Take 1,000 mg by mouth every 8 (eight) hours as needed for mild pain. Maximum of 3000 mg of acetaminophen in 24 hours , consider all sources  to both as needed, plain  Apap, and as needed oxydocone apap.   alum & mag hydroxide-simeth 440-347-42 MG/5ML suspension Commonly known as:  MAALOX PLUS Take by mouth every 4 (four) hours as needed for indigestion.   amLODipine 5 MG tablet Commonly known as:  NORVASC Take 5 mg by mouth daily.   BIOTENE MOISTURIZING MOUTH MT Use as directed 2 sprays in the mouth or throat 4 (four) times daily as needed.   CALCIUM 600+D 600-400 MG-UNIT tablet Generic drug:  Calcium Carbonate-Vitamin D Take 1 tablet by mouth 2 (two) times daily.   carvedilol 3.125 MG tablet Commonly known as:  COREG Take 3.125 mg by mouth 2 (two) times daily.   cetirizine 5 MG tablet Commonly known as:  ZYRTEC Take 5 mg by mouth at bedtime.   diclofenac sodium 1 % Gel Commonly known as:  VOLTAREN Apply 4 g topically 2 (two) times daily between meals. Apply to both knees for pain   dipyridamole-aspirin 200-25 MG 12hr capsule Commonly known as:  AGGRENOX Take 1 capsule by mouth every 12 (twelve) hours.   divalproex 125 MG capsule Commonly known as:  DEPAKOTE SPRINKLE Take 250 mg by mouth 2 (two) times daily. 2 caps   ENDIT EX Apply liberal amount topically to areas of skin irritation as needed. Ok to leave at bedside.   gabapentin 300 MG capsule Commonly known as:  NEURONTIN Take 300 mg by mouth 3 (three) times daily.   guaiFENesin 600 MG 12 hr tablet Commonly known as:  MUCINEX Take 1,200 mg by mouth every 12 (twelve) hours.   ipratropium 0.03 % nasal spray Commonly known as:  ATROVENT Place 2 sprays into both nostrils at bedtime.   ipratropium-albuterol 0.5-2.5 (3) MG/3ML Soln Commonly known as:  DUONEB Take 3 mLs by nebulization 3 (three) times daily as  needed.   losartan 50 MG tablet Commonly known as:  COZAAR Take 50 mg by mouth 2 (two) times daily.   methocarbamol 500 MG tablet Commonly known as:  ROBAXIN Take 500 mg by mouth every 6 (six) hours as needed for muscle spasms.   montelukast 10 MG tablet Commonly known as:  SINGULAIR Take 10 mg by mouth at bedtime.   nortriptyline 25 MG capsule Commonly known as:  PAMELOR Take 25 mg by mouth at bedtime.   omeprazole 20 MG capsule Commonly known as:  PRILOSEC Take 20 mg by mouth every morning. 6 am   oxybutynin 5 MG tablet Commonly known as:  DITROPAN Take 5 mg by mouth 2 (two) times daily.   potassium  chloride 8 MEQ tablet Commonly known as:  KLOR-CON Take 8 mEq by mouth daily.   prazosin 1 MG capsule Commonly known as:  MINIPRESS Take 2 mg by mouth at bedtime. 2 capsules   sennosides-docusate sodium 8.6-50 MG tablet Commonly known as:  SENOKOT-S Take 1 tablet by mouth 2 (two) times daily as needed for constipation.   sennosides-docusate sodium 8.6-50 MG tablet Commonly known as:  SENOKOT-S Take 1 tablet by mouth every 3 (three) days.   SYSTANE PRESERVATIVE FREE 0.4-0.3 % Soln Generic drug:  Polyethyl Glyc-Propyl Glyc PF Place 2 drops into both eyes 2 (two) times daily.   venlafaxine XR 150 MG 24 hr capsule Commonly known as:  EFFEXOR-XR Take 150 mg by mouth daily with breakfast.       Review of Systems  Constitutional: Negative for activity change, appetite change, chills, diaphoresis and fever.  HENT: Negative for congestion, rhinorrhea, sinus pain, sinus pressure, sneezing, sore throat, trouble swallowing and voice change.   Eyes: Negative for pain, redness and visual disturbance.  Respiratory: Negative for apnea, choking, chest tightness, shortness of breath and wheezing.   Cardiovascular: Negative for chest pain, palpitations and leg swelling.  Gastrointestinal: Negative for abdominal distention, abdominal pain, constipation, diarrhea and nausea.    Genitourinary: Negative for difficulty urinating, dysuria, frequency and urgency.  Musculoskeletal: Negative for back pain, gait problem and myalgias. Arthralgias: typical arthritis.  Skin: Positive for wound. Negative for color change, pallor and rash.  Neurological: Negative for dizziness, tremors, syncope, speech difficulty, weakness, numbness and headaches.  Psychiatric/Behavioral: Negative for agitation and behavioral problems.  All other systems reviewed and are negative.   Immunization History  Administered Date(s) Administered  . Influenza Split 09/30/2014  . Influenza Whole 01/08/2004, 10/08/2006, 11/22/2008  . Influenza-Unspecified 08/13/2013, 09/21/2015  . Pneumococcal Conjugate-13 08/13/2013  . Pneumococcal Polysaccharide-23 10/24/2000, 11/30/2014  . Td 01/07/2001  . Zoster 01/07/2006   Pertinent  Health Maintenance Due  Topic Date Due  . INFLUENZA VACCINE  08/07/2016  . DEXA SCAN  Completed  . PNA vac Low Risk Adult  Completed   No flowsheet data found. Functional Status Survey:    Vitals:   09/30/16 0914  BP: 131/66  Pulse: 76  Resp: 18  Temp: 97.7 F (36.5 C)  SpO2: 96%  Weight: 181 lb 4.8 oz (82.2 kg)  Height: 5\' 1"  (1.549 m)   Body mass index is 34.26 kg/m. Physical Exam  Constitutional: She is oriented to person, place, and time. Vital signs are normal. She appears well-developed and well-nourished. She is active and cooperative. She does not appear ill. No distress.  HENT:  Head: Normocephalic and atraumatic.  Mouth/Throat: Uvula is midline, oropharynx is clear and moist and mucous membranes are normal. Mucous membranes are not pale, not dry and not cyanotic.  Eyes: Pupils are equal, round, and reactive to light. Conjunctivae, EOM and lids are normal. Right eye exhibits no discharge. Left eye exhibits no discharge.  Neck: Trachea normal, normal range of motion and full passive range of motion without pain. Neck supple. No JVD present. No tracheal  deviation, no edema, no erythema and normal range of motion present. No thyroid mass and no thyromegaly present.  Cardiovascular: Normal rate, regular rhythm, normal heart sounds, intact distal pulses and normal pulses.  Exam reveals no gallop, no distant heart sounds and no friction rub.   No murmur heard. Pulmonary/Chest: Effort normal. No accessory muscle usage. No respiratory distress. She has no decreased breath sounds. She has no rhonchi. She has  no rales. She exhibits no tenderness.  Abdominal: Soft. Normal appearance and bowel sounds are normal. She exhibits no distension and no ascites. There is no tenderness. There is no guarding.  Musculoskeletal: Normal range of motion. She exhibits no edema, tenderness or deformity.  Expected osteoarthritis, stiffness; calves soft and supple, negative homan's sign  Lymphadenopathy:       Head (right side): No submental, no submandibular and no tonsillar adenopathy present.       Head (left side): No submental, no submandibular and no tonsillar adenopathy present.       Right cervical: No superficial cervical adenopathy present.      Left cervical: No superficial cervical adenopathy present.  Neurological: She is alert and oriented to person, place, and time. She has normal strength.  Skin: Skin is warm, dry and intact. No rash noted. She is not diaphoretic. No cyanosis or erythema. No pallor. Nails show no clubbing.  Corn on Right 5th toe  Psychiatric: Her speech is normal. Judgment normal. Her mood appears anxious. Her affect is blunt. She is withdrawn. Thought content is paranoid and delusional. Cognition and memory are impaired. She exhibits a depressed mood.  Nursing note and vitals reviewed.   Labs reviewed:  Recent Labs  05/30/16 0620 09/03/16 0538 09/10/16 0700  NA 133* 128* 133*  K 3.1* 3.4* 3.8  CL 90* 87* 98*  CO2 33* 32 29  GLUCOSE 77 94 80  BUN 18 17 13   CREATININE 0.67 0.92 0.55  CALCIUM 10.0 9.5 9.4  MG  --  1.8  --      Recent Labs  05/30/16 0620 09/03/16 0538  AST 20 16  ALT 13* 10*  ALKPHOS 57 47  BILITOT 0.6 0.4  PROT 6.2* 6.1*  ALBUMIN 3.5 3.4*    Recent Labs  05/30/16 0620 09/03/16 0538  WBC 5.8 6.1  NEUTROABS 3.3 3.3  HGB 11.8* 11.7*  HCT 34.3* 33.4*  MCV 90.0 90.7  PLT 214 208   Lab Results  Component Value Date   TSH 6.980 (H) 09/03/2016   Lab Results  Component Value Date   HGBA1C 5.6 06/23/2009   Lab Results  Component Value Date   CHOL 201 (H) 07/25/2015   HDL 41 07/25/2015   LDLCALC 138 (H) 07/25/2015   LDLDIRECT 161.4 12/14/2008   TRIG 108 07/25/2015   CHOLHDL 4.9 07/25/2015    Significant Diagnostic Results in last 30 days:  No results found.  Assessment/Plan 1. Hemiplegia and hemiparesis following cerebral infarction affecting left non-dominant side (HCC)  Stable  Continue Aggrenox 25-200 po Q 12 hours  2. Other sequelae of cerebral infarction  Stable  3. Pain of left leg  Stable  Continue Methocarbamol 500 mg po Q 6 hours prn  Continue Voltaren 1% gel- 4 grams to the left knee QID  4. Pain of left arm  Stable  Continue Methocarbamol 500 mg po Q 6 hours prn  5. Essential (primary) hypertension  Stable  Continue Amlodipine 5 mg po Q Day  Continue Carvedilol 3.125 mg po BID  Continue HCTZ 25 mg po Q Day  Continue Losartan 50 mg po BID  6. Major depressive disorder with single episode, remission status unspecified  Stable  Continue Depakote sprinkles 2 capsules po BID  Continue Venlafaxine 150 mg po Q Day  7. Hyperlipidemia, unspecified hyperlipidemia type  Stable  Monitor  8. Uncomplicated asthma, unspecified asthma severity, unspecified whether persistent  Stable  Continue Duonebs TID prn for wheezing  Continue Singulair 10 mg  po Q HS  Continue Mucinex ER 1200 mg po BID   9. Nontoxic thyroid nodule  stable  10. Flexion deformity, left ankle and toes  Stable  Being followed by Dr Elvina Mattes, podiatry    11. Contracture, left hand  Stable  Continue working with [facility]  12. Lack of coordination  Stable  Continue working with [facility]  13. Primary osteoarthritis of left shoulder  Stable  Acetaminophen 1,000 mg po TID prn  14. Gastroesophageal reflux disease without esophagitis  Stable  Continue Omeprazole 20 mg po Q Day  15. Hypothyroidism, unspecified type  Stable   16. Posttraumatic stress disorder  Continue Prazosin 3 mg po Q HS  Pt is being followed by [facility]  17. Osteopenia, unspecified location  Stable  Continue Calcium Carbonate-vitamin D3 600-400 mg 1 tablet po BID  18. Allergic rhinitis, unspecified seasonality, unspecified trigger  Stable  Continue Certirizine 5 mg po Q HS  Continue Atrovent nasal spray 0.03%- 2 sprays in each nostril Q HS  19. Neuropathy  Stable  Increase Gabapentin 400 mg po TID  20. Overactive bladder  Stable  Continue Oxybutynin 5 mg po BID  21. Corn of toe  Corn pad to the toe every other day  22. Dry eyes  Stable  Continue Systane 0.4-0.3%- 1 drop in each eye BID   Family/ staff Communication:   Total Time:  Documentation:  Face to Face:  Family/Phone:   Labs/tests ordered:    Medication list reviewed and assessed for continued appropriateness. Monthly medication orders reviewed and signed.  Vikki Ports, NP-C [facility] [phone], [place] [address] Cell Phone (Mon-Fri 8am-5pm):  [phone] On Call:  [phone] & follow prompts after 5pm & weekends Office Phone:  [phone] Office Fax:  [phone]

## 2016-10-07 ENCOUNTER — Encounter
Admission: RE | Admit: 2016-10-07 | Discharge: 2016-10-07 | Disposition: A | Discharge status: Home / Self Care | Payer: PPO | Source: Ambulatory Visit | Attending: Internal Medicine | Admitting: Internal Medicine

## 2016-10-31 ENCOUNTER — Encounter: Payer: Self-pay | Admitting: Gerontology

## 2016-10-31 ENCOUNTER — Non-Acute Institutional Stay (SKILLED_NURSING_FACILITY): Payer: PPO | Admitting: Gerontology

## 2016-10-31 DIAGNOSIS — L84 Corns and callosities: Secondary | ICD-10-CM

## 2016-10-31 DIAGNOSIS — M21272 Flexion deformity, left ankle and toes: Secondary | ICD-10-CM | POA: Diagnosis not present

## 2016-10-31 DIAGNOSIS — I69398 Other sequelae of cerebral infarction: Secondary | ICD-10-CM | POA: Diagnosis not present

## 2016-10-31 DIAGNOSIS — E041 Nontoxic single thyroid nodule: Secondary | ICD-10-CM | POA: Diagnosis not present

## 2016-10-31 DIAGNOSIS — I10 Essential (primary) hypertension: Secondary | ICD-10-CM

## 2016-10-31 DIAGNOSIS — H04123 Dry eye syndrome of bilateral lacrimal glands: Secondary | ICD-10-CM

## 2016-10-31 DIAGNOSIS — M19012 Primary osteoarthritis, left shoulder: Secondary | ICD-10-CM

## 2016-10-31 DIAGNOSIS — R279 Unspecified lack of coordination: Secondary | ICD-10-CM | POA: Diagnosis not present

## 2016-10-31 DIAGNOSIS — E039 Hypothyroidism, unspecified: Secondary | ICD-10-CM

## 2016-10-31 DIAGNOSIS — E785 Hyperlipidemia, unspecified: Secondary | ICD-10-CM | POA: Diagnosis not present

## 2016-10-31 DIAGNOSIS — F329 Major depressive disorder, single episode, unspecified: Secondary | ICD-10-CM | POA: Diagnosis not present

## 2016-10-31 DIAGNOSIS — F4310 Post-traumatic stress disorder, unspecified: Secondary | ICD-10-CM

## 2016-10-31 DIAGNOSIS — M8580 Other specified disorders of bone density and structure, unspecified site: Secondary | ICD-10-CM

## 2016-10-31 DIAGNOSIS — M79602 Pain in left arm: Secondary | ICD-10-CM

## 2016-10-31 DIAGNOSIS — M79605 Pain in left leg: Secondary | ICD-10-CM

## 2016-10-31 DIAGNOSIS — G629 Polyneuropathy, unspecified: Secondary | ICD-10-CM

## 2016-10-31 DIAGNOSIS — J45909 Unspecified asthma, uncomplicated: Secondary | ICD-10-CM | POA: Diagnosis not present

## 2016-10-31 DIAGNOSIS — I69354 Hemiplegia and hemiparesis following cerebral infarction affecting left non-dominant side: Secondary | ICD-10-CM

## 2016-10-31 DIAGNOSIS — M24542 Contracture, left hand: Secondary | ICD-10-CM | POA: Diagnosis not present

## 2016-10-31 DIAGNOSIS — J309 Allergic rhinitis, unspecified: Secondary | ICD-10-CM

## 2016-10-31 DIAGNOSIS — K219 Gastro-esophageal reflux disease without esophagitis: Secondary | ICD-10-CM

## 2016-10-31 DIAGNOSIS — N3281 Overactive bladder: Secondary | ICD-10-CM

## 2016-11-07 ENCOUNTER — Encounter
Admission: RE | Admit: 2016-11-07 | Discharge: 2016-11-07 | Disposition: A | Discharge status: Home / Self Care | Payer: PPO | Source: Ambulatory Visit | Attending: Internal Medicine | Admitting: Internal Medicine

## 2016-11-13 NOTE — Progress Notes (Signed)
Location:   [facility] Room Number: 619J Place of Service:  SNF [id number]) Provider:  Toni Arthurs, NP-C  [name], MD  Patient Care Team: [name], MD as PCP - General (Family Medicine)  Extended Emergency Contact Information Primary Emergency Contact: Norm Salt Address: [address]          [name] Phone: [phone] Mobile Phone: [phone] Relation: Spouse Secondary Emergency Contact: Barry Dienes, [place] [address] [place] of [place] Phone: [phone] Mobile Phone: [phone] Relation: Daughter  Code Status:  FULL Goals of care: Advanced Directive information Advanced Directives 10/31/2016  Does Patient Have a Medical Advance Directive? No  Type of Advance Directive -  Does patient want to make changes to medical advance directive? -  Copy of [place] in Chart? -  Would patient like information on creating a medical advance directive? -     Chief Complaint  Patient presents with  . Medical Management of Chronic Issues    Routine Visit    HPI:  Pt is a 76 y.o. female seen today for medical management of chronic diseases.  Patient has been stable this past month.  Several months ago, patient began having increased symptoms of PTSD and increased agitation.  She was started on prazosin for the PTSD.  At one point, patient requested a dose reduction.  GDR attempted, patient failed.  Symptoms returned including the paranoia.  Dose increased to 3 mg with the addition of Depakote.  She has been on this med regimen for several months and is doing well with it.  Patient is again smiling and seems content.  She is not as tearful or fearful of staff that she was.  Patient also reports improvement in the leg pain.  She participates in most activities and seems to enjoy being around other residents.  Appetite is good.  She is having regular  BMs.  Vital signs stable.  No other complaints.  Past Medical History:  Diagnosis Date  . Anxiety   . Arthritis   . Asthma   . 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 Depression   . Depression with anxiety   . GERD (gastroesophageal reflux disease)   . Hemiparesis affecting left side as late effect of cerebrovascular accident (CVA) ([place])   . History of CVA (cerebrovascular accident)   . Hot flashes   . Hyperlipidemia   . Hyperlipidemia, unspecified   . Hypertension   . Muscle hypertonicity   . Osteoarthritis   . Stroke ([place])   . TIA (transient ischemic attack)    Past Surgical History:  Procedure Laterality Date  . APPENDECTOMY    . COLONOSCOPY  06/17/2008  . EYE SURGERY Bilateral    Cataract Extraction with IOL  . VAGINAL HYSTERECTOMY      Allergies  Allergen Reactions  . Tizanidine Other (See Comments)    HYPOTENSION AND UNRESPONSIVENESS  . Ace Inhibitors Cough  . Latex Hives  . Lisinopril Cough  . Tetracycline Other (See Comments)    Reaction:  Unknown   . Zanaflex [Tizanidine Hcl] Other (See Comments)    "drowsy"    Allergies as of 10/31/2016      Reactions   Tizanidine Other (See Comments)   HYPOTENSION AND UNRESPONSIVENESS   Ace Inhibitors Cough   Latex Hives   Lisinopril Cough   Tetracycline Other (See Comments)  Reaction:  Unknown    Zanaflex [tizanidine Hcl] Other (See Comments)   "drowsy"      Medication List        Accurate as of 10/31/16 11:59 PM. Always use your most recent med list.          acetaminophen 500 MG tablet Commonly known as:  TYLENOL Take 1,000 mg by mouth every 8 (eight) hours as needed for mild pain. Maximum of 3000 mg of acetaminophen in 24 hours , consider all sources  to both as needed, plain  Apap, and as needed oxydocone apap.   alum & mag hydroxide-simeth 175-102-58 MG/5ML suspension Commonly known as:  MAALOX PLUS Take by mouth every 4 (four) hours as needed for indigestion.   amLODipine 5 MG  tablet Commonly known as:  NORVASC Take 5 mg by mouth daily.   BIOTENE MOISTURIZING MOUTH MT Use as directed 2 sprays in the mouth or throat 4 (four) times daily as needed.   CALCIUM 600+D 600-400 MG-UNIT tablet Generic drug:  Calcium Carbonate-Vitamin D Take 1 tablet by mouth 2 (two) times daily.   carvedilol 3.125 MG tablet Commonly known as:  COREG Take 3.125 mg by mouth 2 (two) times daily.   cetirizine 5 MG tablet Commonly known as:  ZYRTEC Take 5 mg by mouth at bedtime.   diclofenac sodium 1 % Gel Commonly known as:  VOLTAREN Apply 4 g topically 2 (two) times daily between meals. Apply to both knees for pain   dipyridamole-aspirin 200-25 MG 12hr capsule Commonly known as:  AGGRENOX Take 1 capsule by mouth every 12 (twelve) hours.   divalproex 125 MG capsule Commonly known as:  DEPAKOTE SPRINKLE Take 250 mg by mouth 2 (two) times daily. 2 caps   ENDIT EX Apply liberal amount topically to areas of skin irritation as needed. Ok to leave at bedside.   gabapentin 400 MG capsule Commonly known as:  NEURONTIN Take 400 mg by mouth 3 (three) times daily.   guaiFENesin 600 MG 12 hr tablet Commonly known as:  MUCINEX Take 1,200 mg by mouth every 12 (twelve) hours.   ipratropium 0.03 % nasal spray Commonly known as:  ATROVENT Place 2 sprays into both nostrils at bedtime.   ipratropium-albuterol 0.5-2.5 (3) MG/3ML Soln Commonly known as:  DUONEB Take 3 mLs by nebulization 3 (three) times daily as needed.   losartan 50 MG tablet Commonly known as:  COZAAR Take 50 mg by mouth 2 (two) times daily.   methocarbamol 500 MG tablet Commonly known as:  ROBAXIN Take 500 mg by mouth every 6 (six) hours as needed for muscle spasms.   montelukast 10 MG tablet Commonly known as:  SINGULAIR Take 10 mg by mouth at bedtime.   nortriptyline 25 MG capsule Commonly known as:  PAMELOR Take 25 mg by mouth at bedtime.   omeprazole 20 MG capsule Commonly known as:  PRILOSEC Take  20 mg by mouth every morning. 6 am   oxybutynin 5 MG tablet Commonly known as:  DITROPAN Take 5 mg by mouth 2 (two) times daily.   potassium chloride 8 MEQ tablet Commonly known as:  KLOR-CON Take 8 mEq by mouth daily.   prazosin 1 MG capsule Commonly known as:  MINIPRESS Take 3 mg by mouth at bedtime. 3 capsules   sennosides-docusate sodium 8.6-50 MG tablet Commonly known as:  SENOKOT-S Take 1 tablet by mouth 2 (two) times daily as needed for constipation.   sennosides-docusate sodium 8.6-50 MG tablet Commonly known as:  SENOKOT-S  Take 1 tablet by mouth every 3 (three) days.   SYSTANE PRESERVATIVE FREE 0.4-0.3 % Soln Generic drug:  Polyethyl Glyc-Propyl Glyc PF Place 2 drops into both eyes 2 (two) times daily.   venlafaxine XR 150 MG 24 hr capsule Commonly known as:  EFFEXOR-XR Take 150 mg by mouth daily with breakfast.       Review of Systems  Constitutional: Negative for activity change, appetite change, chills, diaphoresis and fever.  HENT: Negative for congestion, rhinorrhea, sinus pressure, sinus pain, sneezing, sore throat, trouble swallowing and voice change.   Eyes: Negative for pain, redness and visual disturbance.  Respiratory: Negative for apnea, cough, choking, chest tightness, shortness of breath and wheezing.   Cardiovascular: Negative for chest pain, palpitations and leg swelling.  Gastrointestinal: Negative for abdominal distention, abdominal pain, constipation, diarrhea and nausea.  Genitourinary: Negative for difficulty urinating, dysuria, frequency and urgency.  Musculoskeletal: Positive for arthralgias (typical arthritis). Negative for back pain, gait problem and myalgias.  Skin: Negative for color change, pallor and rash.  Neurological: Negative for dizziness, tremors, syncope, speech difficulty, weakness, numbness and headaches.  Psychiatric/Behavioral: Positive for dysphoric mood. Negative for agitation and behavioral problems.  All other systems  reviewed and are negative.   Immunization History  Administered Date(s) Administered  . Influenza Split 09/30/2014  . Influenza Whole 01/08/2004, 10/08/2006, 11/22/2008  . Influenza-Unspecified 08/13/2013, 09/21/2015, 09/25/2016  . Pneumococcal Conjugate-13 08/13/2013  . Pneumococcal Polysaccharide-23 10/24/2000, 11/30/2014  . Td 01/07/2001  . Zoster 01/07/2006   Pertinent  Health Maintenance Due  Topic Date Due  . INFLUENZA VACCINE  Completed  . DEXA SCAN  Completed  . PNA vac Low Risk Adult  Completed   No flowsheet data found. Functional Status Survey:    Vitals:   10/31/16 1126  BP: (!) 118/57  Pulse: 80  Resp: 18  Temp: 97.7 F (36.5 C)  SpO2: 98%  Weight: 187 lb 6.4 oz (85 kg)  Height: 5\' 1"  (1.549 m)   Body mass index is 35.41 kg/m. Physical Exam  Constitutional: She is oriented to person, place, and time. Vital signs are normal. She appears well-developed and well-nourished. She is active and cooperative. She does not appear ill. No distress.  HENT:  Head: Normocephalic and atraumatic.  Mouth/Throat: Uvula is midline, oropharynx is clear and moist and mucous membranes are normal. Mucous membranes are not pale, not dry and not cyanotic.  Eyes: Conjunctivae, EOM and lids are normal. Pupils are equal, round, and reactive to light. Right eye exhibits no discharge. Left eye exhibits no discharge.  Neck: Trachea normal, normal range of motion and full passive range of motion without pain. Neck supple. No JVD present. No tracheal deviation, no edema, no erythema and normal range of motion present. No thyroid mass and no thyromegaly present.  Cardiovascular: Normal rate, regular rhythm, normal heart sounds, intact distal pulses and normal pulses. Exam reveals no gallop, no distant heart sounds and no friction rub.  No murmur heard. Pulses:      Dorsalis pedis pulses are 2+ on the right side, and 2+ on the left side.  Minimal edema  Pulmonary/Chest: Effort normal and  breath sounds normal. No accessory muscle usage. No respiratory distress. She has no decreased breath sounds. She has no wheezes. She has no rhonchi. She has no rales. She exhibits no tenderness.  Abdominal: Soft. Normal appearance and bowel sounds are normal. She exhibits no distension and no ascites. There is no tenderness. There is no guarding.  Musculoskeletal: Normal range of  motion. She exhibits no edema, tenderness or deformity.  Expected osteoarthritis, stiffness; calves soft and supple, negative homan's sign  Lymphadenopathy:       Head (right side): No submental, no submandibular and no tonsillar adenopathy present.       Head (left side): No submental, no submandibular and no tonsillar adenopathy present.       Right cervical: No superficial cervical adenopathy present.      Left cervical: No superficial cervical adenopathy present.  Neurological: She is alert and oriented to person, place, and time. She has normal strength. She exhibits abnormal muscle tone. Coordination and gait abnormal.  Skin: Skin is warm, dry and intact. No rash noted. She is not diaphoretic. No cyanosis or erythema. No pallor. Nails show no clubbing.  Corn on Right 5th toe  Psychiatric: Her speech is normal. Judgment normal. Her mood appears anxious. Her affect is blunt. She is withdrawn. Thought content is paranoid and delusional. Cognition and memory are impaired. She exhibits a depressed mood.  Nursing note and vitals reviewed.   Labs reviewed: Recent Labs    05/30/16 0620 09/03/16 0538 09/10/16 0700  NA 133* 128* 133*  K 3.1* 3.4* 3.8  CL 90* 87* 98*  CO2 33* 32 29  GLUCOSE 77 94 80  BUN 18 17 13   CREATININE 0.67 0.92 0.55  CALCIUM 10.0 9.5 9.4  MG  --  1.8  --    Recent Labs    05/30/16 0620 09/03/16 0538  AST 20 16  ALT 13* 10*  ALKPHOS 57 47  BILITOT 0.6 0.4  PROT 6.2* 6.1*  ALBUMIN 3.5 3.4*   Recent Labs    05/30/16 0620 09/03/16 0538  WBC 5.8 6.1  NEUTROABS 3.3 3.3  HGB  11.8* 11.7*  HCT 34.3* 33.4*  MCV 90.0 90.7  PLT 214 208   Lab Results  Component Value Date   TSH 6.980 (H) 09/03/2016   Lab Results  Component Value Date   HGBA1C 5.6 06/23/2009   Lab Results  Component Value Date   CHOL 201 (H) 07/25/2015   HDL 41 07/25/2015   LDLCALC 138 (H) 07/25/2015   LDLDIRECT 161.4 12/14/2008   TRIG 108 07/25/2015   CHOLHDL 4.9 07/25/2015    Significant Diagnostic Results in last 30 days:  No results found.  Assessment/Plan 1. Hemiplegia and hemiparesis following cerebral infarction affecting left non-dominant side (HCC)  Stable  Continue Aggrenox 25-200 po Q 12 hours  2. Other sequelae of cerebral infarction  Stable  3. Pain of left leg  Stable  Continue Methocarbamol 500 mg po Q 6 hours prn  Continue Voltaren 1% gel- 4 grams to the left knee QID  4. Pain of left arm  Stable  Continue Methocarbamol 500 mg po Q 6 hours prn  5. Essential (primary) hypertension  Stable  Continue Amlodipine 5 mg po Q Day  Continue Carvedilol 3.125 mg po BID  Continue HCTZ 25 mg po Q Day  Continue Losartan 50 mg po BID  6. Major depressive disorder with single episode, remission status unspecified  Stable  Continue Depakote sprinkles 2 capsules po BID  Continue Venlafaxine 150 mg po Q Day  7. Hyperlipidemia, unspecified hyperlipidemia type  Stable  Monitor  8. Uncomplicated asthma, unspecified asthma severity, unspecified whether persistent  Stable  Continue Duonebs TID prn for wheezing  Continue Singulair 10 mg po Q HS  Continue Mucinex ER 1200 mg po BID   9. Nontoxic thyroid nodule  stable  10. Flexion deformity, left  ankle and toes  Stable  Being followed by Dr Elvina Mattes, podiatry   11. Contracture, left hand  Stable  Continue working with [facility]  12. Lack of coordination  Stable  Continue working with [facility]  13. Primary osteoarthritis of left  shoulder  Stable  Acetaminophen 1,000 mg po TID prn  14. Gastroesophageal reflux disease without esophagitis  Stable  Continue Omeprazole 20 mg po Q Day  15. Hypothyroidism, unspecified type  Stable   16. Posttraumatic stress disorder  Improved  Continue Prazosin 3 mg po Q HS  17. Osteopenia, unspecified location  Stable  Continue Calcium Carbonate-vitamin D3 600-400 mg 1 tablet po BID  18. Allergic rhinitis, unspecified seasonality, unspecified trigger  Stable  Continue Certirizine 5 mg po Q HS  Continue Atrovent nasal spray 0.03%- 2 sprays in each nostril Q HS  19. Neuropathy  Stable  Increase Gabapentin 400 mg po TID  20. Overactive bladder  Stable  Continue Oxybutynin 5 mg po BID  21. Corn of toe  Corn pad to the toe every other day  22. Dry eyes  Stable  Continue Systane 0.4-0.3%- 1 drop in each eye BID   Family/ staff Communication:   Total Time:  Documentation:  Face to Face:  Family/Phone:   Labs/tests ordered: Not due  Medication list reviewed and assessed for continued appropriateness. Monthly medication orders reviewed and signed.  Vikki Ports, NP-C Geriatrics [facility] [phone],  [id number] Cell Phone (Mon-Fri 8am-5pm):  [phone] On Call:  [phone] & follow prompts after 5pm & weekends Office Phone:  [phone] Office Fax:  [phone]

## 2016-11-18 ENCOUNTER — Encounter: Payer: Self-pay | Admitting: Gerontology

## 2016-11-18 ENCOUNTER — Non-Acute Institutional Stay (SKILLED_NURSING_FACILITY): Payer: PPO | Admitting: Gerontology

## 2016-11-18 DIAGNOSIS — J069 Acute upper respiratory infection, unspecified: Secondary | ICD-10-CM | POA: Diagnosis not present

## 2016-11-18 NOTE — Progress Notes (Signed)
Location:   [facility] Room Number: 938B Place of Service:  SNF [phone]) Provider:  Toni Arthurs, NP-C  [name], MD  Patient Care Team: [name], MD as PCP - General (Family Medicine)  Extended Emergency Contact Information Primary Emergency Contact: Norm Salt Address: [address]          [name] Phone: [phone] Mobile Phone: [phone] Relation: Spouse Secondary Emergency Contact: Barry Dienes,  [address] [address] [place] Phone: [phone] Mobile Phone: [phone] Relation: Daughter  Code Status: FULL Goals of care: Advanced Directive information Advanced Directives 11/18/2016  Does Patient Have a Medical Advance Directive? No  Type of Advance Directive -  Does patient want to make changes to medical advance directive? -  Copy of [place] in Chart? -  Would patient like information on creating a medical advance directive? -     Chief Complaint  Patient presents with  . Acute Visit    Recheck for Upper Respiratory Infection    HPI:  Pt is a 76 y.o. female seen today for an acute visit for c/o cough and congestion. Pt reports she has had sx for several days. Reports her chest feels tight/constricted. No fevers. Denies rhinorrhea, sinus pain, denies n/v/d/f/c/cp/sob/ ha/abd pain/dizziness. Appetite is fair. VSS. No other complaints.    Past Medical History:  Diagnosis Date  . Anxiety   . Arthritis   . Asthma   . 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 Depression   . Depression with anxiety   . GERD (gastroesophageal reflux disease)   . Hemiparesis affecting left side as late effect of cerebrovascular accident (CVA) ([place])   . History of CVA (cerebrovascular accident)   . Hot flashes   . Hyperlipidemia   . Hyperlipidemia, unspecified   . Hypertension   . Muscle hypertonicity   . Osteoarthritis     . Stroke ([place])   . TIA (transient ischemic attack)    Past Surgical History:  Procedure Laterality Date  . APPENDECTOMY    . COLONOSCOPY  06/17/2008  . EYE SURGERY Bilateral    Cataract Extraction with IOL  . VAGINAL HYSTERECTOMY      Allergies  Allergen Reactions  . Tizanidine Other (See Comments)    HYPOTENSION AND UNRESPONSIVENESS  . Ace Inhibitors Cough  . Latex Hives  . Lisinopril Cough  . Tetracycline Other (See Comments)    Reaction:  Unknown   . Zanaflex [Tizanidine Hcl] Other (See Comments)    "drowsy"    Allergies as of 11/18/2016      Reactions   Tizanidine Other (See Comments)   HYPOTENSION AND UNRESPONSIVENESS   Ace Inhibitors Cough   Latex Hives   Lisinopril Cough   Tetracycline Other (See Comments)   Reaction:  Unknown    Zanaflex [tizanidine Hcl] Other (See Comments)   "drowsy"      Medication List        Accurate as of 11/18/16 11:21 AM. Always use your most recent med list.          acetaminophen 500 MG tablet Commonly known as:  TYLENOL Take 1,000 mg by mouth every 8 (eight) hours as needed for mild pain. Maximum of 3000 mg of acetaminophen in 24 hours , consider all sources  to both as needed, plain  Apap, and as needed oxydocone apap.   alum & mag  hydroxide-simeth 643-329-51 MG/5ML suspension Commonly known as:  MAALOX PLUS Take by mouth every 4 (four) hours as needed for indigestion.   amLODipine 5 MG tablet Commonly known as:  NORVASC Take 5 mg by mouth daily.   azithromycin 250 MG tablet Commonly known as:  ZITHROMAX Take 250 mg daily by mouth.   BIOTENE MOISTURIZING MOUTH MT Use as directed 2 sprays in the mouth or throat 4 (four) times daily as needed.   CALCIUM 600+D 600-400 MG-UNIT tablet Generic drug:  Calcium Carbonate-Vitamin D Take 1 tablet by mouth 2 (two) times daily.   carvedilol 3.125 MG tablet Commonly known as:  COREG Take 3.125 mg by mouth 2 (two) times daily.   cetirizine 5 MG tablet Commonly known as:   ZYRTEC Take 5 mg by mouth at bedtime.   diclofenac sodium 1 % Gel Commonly known as:  VOLTAREN Apply 4 g topically 2 (two) times daily between meals. Apply to both knees for pain   dipyridamole-aspirin 200-25 MG 12hr capsule Commonly known as:  AGGRENOX Take 1 capsule by mouth every 12 (twelve) hours.   divalproex 125 MG capsule Commonly known as:  DEPAKOTE SPRINKLE Take 250 mg by mouth 2 (two) times daily. 2 caps   ENDIT EX Apply liberal amount topically to areas of skin irritation as needed. Ok to leave at bedside.   gabapentin 400 MG capsule Commonly known as:  NEURONTIN Take 400 mg by mouth 3 (three) times daily.   guaiFENesin 600 MG 12 hr tablet Commonly known as:  MUCINEX Take 1,200 mg by mouth every 12 (twelve) hours.   ipratropium 0.03 % nasal spray Commonly known as:  ATROVENT Place 2 sprays into both nostrils at bedtime.   ipratropium-albuterol 0.5-2.5 (3) MG/3ML Soln Commonly known as:  DUONEB Take 3 mLs by nebulization 3 (three) times daily as needed.   losartan 50 MG tablet Commonly known as:  COZAAR Take 50 mg by mouth 2 (two) times daily.   methocarbamol 500 MG tablet Commonly known as:  ROBAXIN Take 500 mg by mouth every 6 (six) hours as needed for muscle spasms.   montelukast 10 MG tablet Commonly known as:  SINGULAIR Take 10 mg by mouth at bedtime.   nortriptyline 25 MG capsule Commonly known as:  PAMELOR Take 25 mg by mouth at bedtime.   omeprazole 20 MG capsule Commonly known as:  PRILOSEC Take 20 mg by mouth every morning. 6 am   oxybutynin 5 MG tablet Commonly known as:  DITROPAN Take 5 mg by mouth 2 (two) times daily.   potassium chloride 8 MEQ tablet Commonly known as:  KLOR-CON Take 8 mEq by mouth daily.   prazosin 1 MG capsule Commonly known as:  MINIPRESS Take 3 mg by mouth at bedtime. 3 capsules   sennosides-docusate sodium 8.6-50 MG tablet Commonly known as:  SENOKOT-S Take 1 tablet by mouth 2 (two) times daily as  needed for constipation.   sennosides-docusate sodium 8.6-50 MG tablet Commonly known as:  SENOKOT-S Take 1 tablet by mouth every 3 (three) days.   SYSTANE PRESERVATIVE FREE 0.4-0.3 % Soln Generic drug:  Polyethyl Glyc-Propyl Glyc PF Place 2 drops into both eyes 2 (two) times daily.   venlafaxine XR 150 MG 24 hr capsule Commonly known as:  EFFEXOR-XR Take 150 mg by mouth daily with breakfast.       Review of Systems  Constitutional: Negative for activity change, appetite change, chills, diaphoresis and fever.  HENT: Negative for congestion, facial swelling, hearing loss, mouth sores,  nosebleeds, postnasal drip, rhinorrhea, sinus pressure, sinus pain, sneezing, sore throat, trouble swallowing and voice change.   Respiratory: Positive for cough and wheezing. Negative for apnea, choking, chest tightness and shortness of breath.   Cardiovascular: Negative for chest pain, palpitations and leg swelling.  Gastrointestinal: Negative for abdominal distention, abdominal pain, constipation, diarrhea and nausea.  Genitourinary: Negative for difficulty urinating, dysuria, frequency and urgency.  Musculoskeletal: Negative for back pain, gait problem and myalgias. Arthralgias: typical arthritis.  Skin: Negative for color change, pallor, rash and wound.  Neurological: Negative for dizziness, tremors, syncope, speech difficulty, weakness, numbness and headaches.  Psychiatric/Behavioral: Negative for agitation and behavioral problems.  All other systems reviewed and are negative.   Immunization History  Administered Date(s) Administered  . Influenza Split 09/30/2014  . Influenza Whole 01/08/2004, 10/08/2006, 11/22/2008  . Influenza-Unspecified 08/13/2013, 09/21/2015, 09/25/2016  . Pneumococcal Conjugate-13 08/13/2013  . Pneumococcal Polysaccharide-23 10/24/2000, 11/30/2014  . Td 01/07/2001  . Zoster 01/07/2006   Pertinent  Health Maintenance Due  Topic Date Due  . INFLUENZA VACCINE   Completed  . DEXA SCAN  Completed  . PNA vac Low Risk Adult  Completed   No flowsheet data found. Functional Status Survey:    Vitals:   11/18/16 1101  BP: 128/66  Pulse: 88  Resp: 18  Temp: 98.1 F (36.7 C)  TempSrc: Oral  SpO2: 94%  Weight: 193 lb 3.2 oz (87.6 kg)  Height: 5\' 1"  (1.549 m)   Body mass index is 36.5 kg/m. Physical Exam  Constitutional: She is oriented to person, place, and time. Vital signs are normal. She appears well-developed and well-nourished. She is active and cooperative. She does not appear ill. No distress.  HENT:  Head: Normocephalic and atraumatic.  Mouth/Throat: Uvula is midline, oropharynx is clear and moist and mucous membranes are normal. Mucous membranes are not pale, not dry and not cyanotic.  Eyes: Conjunctivae, EOM and lids are normal. Pupils are equal, round, and reactive to light.  Neck: Trachea normal, normal range of motion and full passive range of motion without pain. Neck supple. No JVD present. No tracheal deviation, no edema and no erythema present. No thyromegaly present.  Cardiovascular: Normal rate, regular rhythm, normal heart sounds, intact distal pulses and normal pulses. Exam reveals no gallop, no distant heart sounds and no friction rub.  No murmur heard. Pulses:      Dorsalis pedis pulses are 2+ on the right side, and 2+ on the left side.  No edema  Pulmonary/Chest: Effort normal. No accessory muscle usage. No respiratory distress. She has no decreased breath sounds. She has wheezes in the right upper field and the left upper field. She has no rhonchi. She has rales in the right upper field and the left upper field. She exhibits no tenderness.  Abdominal: Soft. Normal appearance and bowel sounds are normal. She exhibits no distension and no ascites. There is no tenderness.  Musculoskeletal: Normal range of motion. She exhibits no edema or tenderness.  Expected osteoarthritis, stiffness; Bilateral Calves soft, supple. Negative  Homan's Sign. B- pedal pulses equal  Neurological: She is alert and oriented to person, place, and time. She has normal strength.  Skin: Skin is warm, dry and intact. She is not diaphoretic. No cyanosis. No pallor. Nails show no clubbing.  Psychiatric: She has a normal mood and affect. Her speech is normal and behavior is normal. Judgment and thought content normal. Cognition and memory are normal.  Nursing note and vitals reviewed.   Labs reviewed: Recent  Labs    05/30/16 0620 09/03/16 0538 09/10/16 0700  NA 133* 128* 133*  K 3.1* 3.4* 3.8  CL 90* 87* 98*  CO2 33* 32 29  GLUCOSE 77 94 80  BUN 18 17 13   CREATININE 0.67 0.92 0.55  CALCIUM 10.0 9.5 9.4  MG  --  1.8  --    Recent Labs    05/30/16 0620 09/03/16 0538  AST 20 16  ALT 13* 10*  ALKPHOS 57 47  BILITOT 0.6 0.4  PROT 6.2* 6.1*  ALBUMIN 3.5 3.4*   Recent Labs    05/30/16 0620 09/03/16 0538  WBC 5.8 6.1  NEUTROABS 3.3 3.3  HGB 11.8* 11.7*  HCT 34.3* 33.4*  MCV 90.0 90.7  PLT 214 208   Lab Results  Component Value Date   TSH 6.980 (H) 09/03/2016   Lab Results  Component Value Date   HGBA1C 5.6 06/23/2009   Lab Results  Component Value Date   CHOL 201 (H) 07/25/2015   HDL 41 07/25/2015   LDLCALC 138 (H) 07/25/2015   LDLDIRECT 161.4 12/14/2008   TRIG 108 07/25/2015   CHOLHDL 4.9 07/25/2015    Significant Diagnostic Results in last 30 days:  No results found.  Assessment/Plan 1. Upper respiratory infection, acute  Azithromycin 250 mg- 2 tablets po today, then 1 tablet daily x 4 days  Lasix 40 mg IM x 1 now  Duonebs TID x 3 days scheduled  Guaifenesin 10 mL po QID x 3 days  Family/ staff Communication:   Total Time:  Documentation:  Face to Face:  Family/Phone:   Labs/tests ordered:    Medication list reviewed and assessed for continued appropriateness.  Vikki Ports, NP-C Geriatrics [facility] [id number], [place]  [id number] Cell Phone (Mon-Fri 8am-5pm):  [phone] On Call:  [phone] & follow prompts after 5pm & weekends Office Phone:  [phone] Office Fax:  [phone]

## 2016-11-26 ENCOUNTER — Other Ambulatory Visit
Admission: RE | Admit: 2016-11-26 | Discharge: 2016-11-26 | Disposition: A | Discharge status: Home / Self Care | Payer: PPO | Source: Ambulatory Visit | Attending: Gerontology | Admitting: Gerontology

## 2016-11-26 DIAGNOSIS — R609 Edema, unspecified: Secondary | ICD-10-CM | POA: Diagnosis not present

## 2016-11-26 LAB — BASIC METABOLIC PANEL
ANION GAP: 7 (ref 5–15)
BUN: 20 mg/dL (ref 6–20)
CALCIUM: 9.8 mg/dL (ref 8.9–10.3)
CO2: 29 mmol/L (ref 22–32)
Chloride: 89 mmol/L — ABNORMAL LOW (ref 101–111)
Creatinine, Ser: 0.72 mg/dL (ref 0.44–1.00)
GFR calc Af Amer: >60 mL/min (ref >60)
GLUCOSE: 81 mg/dL (ref 65–99)
Potassium: 5 mmol/L (ref 3.5–5.1)
Sodium: 125 mmol/L — ABNORMAL LOW (ref 135–145)

## 2016-12-03 ENCOUNTER — Other Ambulatory Visit
Admission: RE | Admit: 2016-12-03 | Discharge: 2016-12-03 | Disposition: A | Discharge status: Home / Self Care | Payer: PPO | Source: Other Acute Inpatient Hospital | Attending: Gerontology | Admitting: Gerontology

## 2016-12-03 DIAGNOSIS — R609 Edema, unspecified: Secondary | ICD-10-CM | POA: Insufficient documentation

## 2016-12-03 LAB — BASIC METABOLIC PANEL
ANION GAP: 7 (ref 5–15)
BUN: 20 mg/dL (ref 6–20)
CO2: 30 mmol/L (ref 22–32)
Calcium: 9.5 mg/dL (ref 8.9–10.3)
Chloride: 89 mmol/L — ABNORMAL LOW (ref 101–111)
Creatinine, Ser: 0.7 mg/dL (ref 0.44–1.00)
GFR calc Af Amer: >60 mL/min (ref >60)
GFR calc non Af Amer: >60 mL/min (ref >60)
GLUCOSE: 83 mg/dL (ref 65–99)
POTASSIUM: 5.1 mmol/L (ref 3.5–5.1)
Sodium: 126 mmol/L — ABNORMAL LOW (ref 135–145)

## 2016-12-06 ENCOUNTER — Non-Acute Institutional Stay (SKILLED_NURSING_FACILITY): Payer: PPO | Admitting: Gerontology

## 2016-12-06 ENCOUNTER — Encounter: Payer: Self-pay | Admitting: Gerontology

## 2016-12-06 DIAGNOSIS — J45909 Unspecified asthma, uncomplicated: Secondary | ICD-10-CM | POA: Diagnosis not present

## 2016-12-06 DIAGNOSIS — M79602 Pain in left arm: Secondary | ICD-10-CM

## 2016-12-06 DIAGNOSIS — I69398 Other sequelae of cerebral infarction: Secondary | ICD-10-CM | POA: Diagnosis not present

## 2016-12-06 DIAGNOSIS — E785 Hyperlipidemia, unspecified: Secondary | ICD-10-CM | POA: Diagnosis not present

## 2016-12-06 DIAGNOSIS — M21272 Flexion deformity, left ankle and toes: Secondary | ICD-10-CM

## 2016-12-06 DIAGNOSIS — M19012 Primary osteoarthritis, left shoulder: Secondary | ICD-10-CM

## 2016-12-06 DIAGNOSIS — F329 Major depressive disorder, single episode, unspecified: Secondary | ICD-10-CM | POA: Diagnosis not present

## 2016-12-06 DIAGNOSIS — M79605 Pain in left leg: Secondary | ICD-10-CM | POA: Diagnosis not present

## 2016-12-06 DIAGNOSIS — M24542 Contracture, left hand: Secondary | ICD-10-CM

## 2016-12-06 DIAGNOSIS — R279 Unspecified lack of coordination: Secondary | ICD-10-CM

## 2016-12-06 DIAGNOSIS — E039 Hypothyroidism, unspecified: Secondary | ICD-10-CM

## 2016-12-06 DIAGNOSIS — I69354 Hemiplegia and hemiparesis following cerebral infarction affecting left non-dominant side: Secondary | ICD-10-CM | POA: Diagnosis not present

## 2016-12-06 DIAGNOSIS — J309 Allergic rhinitis, unspecified: Secondary | ICD-10-CM

## 2016-12-06 DIAGNOSIS — H04123 Dry eye syndrome of bilateral lacrimal glands: Secondary | ICD-10-CM

## 2016-12-06 DIAGNOSIS — F4310 Post-traumatic stress disorder, unspecified: Secondary | ICD-10-CM

## 2016-12-06 DIAGNOSIS — I10 Essential (primary) hypertension: Secondary | ICD-10-CM | POA: Diagnosis not present

## 2016-12-06 DIAGNOSIS — M8580 Other specified disorders of bone density and structure, unspecified site: Secondary | ICD-10-CM

## 2016-12-06 DIAGNOSIS — E041 Nontoxic single thyroid nodule: Secondary | ICD-10-CM

## 2016-12-06 DIAGNOSIS — G629 Polyneuropathy, unspecified: Secondary | ICD-10-CM

## 2016-12-06 DIAGNOSIS — K219 Gastro-esophageal reflux disease without esophagitis: Secondary | ICD-10-CM

## 2016-12-06 DIAGNOSIS — L84 Corns and callosities: Secondary | ICD-10-CM

## 2016-12-06 DIAGNOSIS — N3281 Overactive bladder: Secondary | ICD-10-CM

## 2016-12-07 ENCOUNTER — Encounter
Admission: RE | Admit: 2016-12-07 | Discharge: 2016-12-07 | Disposition: A | Discharge status: Home / Self Care | Payer: PPO | Source: Ambulatory Visit | Attending: Internal Medicine | Admitting: Internal Medicine

## 2016-12-09 ENCOUNTER — Encounter
Admission: RE | Admit: 2016-12-09 | Discharge: 2016-12-09 | Disposition: A | Discharge status: Home / Self Care | Payer: PPO | Source: Ambulatory Visit | Attending: Internal Medicine | Admitting: Internal Medicine

## 2016-12-10 ENCOUNTER — Other Ambulatory Visit
Admission: RE | Admit: 2016-12-10 | Discharge: 2016-12-10 | Disposition: A | Discharge status: Home / Self Care | Payer: PPO | Source: Ambulatory Visit | Attending: Gerontology | Admitting: Gerontology

## 2016-12-10 DIAGNOSIS — R609 Edema, unspecified: Secondary | ICD-10-CM | POA: Diagnosis not present

## 2016-12-10 LAB — BASIC METABOLIC PANEL
ANION GAP: 9 (ref 5–15)
BUN: 19 mg/dL (ref 6–20)
CALCIUM: 9.4 mg/dL (ref 8.9–10.3)
CO2: 30 mmol/L (ref 22–32)
CREATININE: 0.82 mg/dL (ref 0.44–1.00)
Chloride: 85 mmol/L — ABNORMAL LOW (ref 101–111)
GFR calc Af Amer: >60 mL/min (ref >60)
GLUCOSE: 87 mg/dL (ref 65–99)
Potassium: 4.6 mmol/L (ref 3.5–5.1)
Sodium: 124 mmol/L — ABNORMAL LOW (ref 135–145)

## 2016-12-17 ENCOUNTER — Other Ambulatory Visit
Admission: RE | Admit: 2016-12-17 | Discharge: 2016-12-17 | Disposition: A | Discharge status: Home / Self Care | Payer: PPO | Source: Ambulatory Visit | Attending: Gerontology | Admitting: Gerontology

## 2016-12-17 DIAGNOSIS — E871 Hypo-osmolality and hyponatremia: Secondary | ICD-10-CM | POA: Insufficient documentation

## 2016-12-17 LAB — BASIC METABOLIC PANEL
ANION GAP: 11 (ref 5–15)
BUN: 23 mg/dL — AB (ref 6–20)
CALCIUM: 9.7 mg/dL (ref 8.9–10.3)
CO2: 26 mmol/L (ref 22–32)
CREATININE: 0.65 mg/dL (ref 0.44–1.00)
Chloride: 87 mmol/L — ABNORMAL LOW (ref 101–111)
GFR calc Af Amer: >60 mL/min (ref >60)
GLUCOSE: 84 mg/dL (ref 65–99)
Potassium: 4.9 mmol/L (ref 3.5–5.1)
Sodium: 124 mmol/L — ABNORMAL LOW (ref 135–145)

## 2016-12-24 ENCOUNTER — Ambulatory Visit
Admission: RE | Admit: 2016-12-24 | Discharge: 2016-12-24 | Disposition: A | Discharge status: Home / Self Care | Payer: PPO | Source: Ambulatory Visit | Attending: Gerontology | Admitting: Gerontology

## 2016-12-24 DIAGNOSIS — E871 Hypo-osmolality and hyponatremia: Secondary | ICD-10-CM | POA: Diagnosis not present

## 2016-12-24 DIAGNOSIS — R609 Edema, unspecified: Secondary | ICD-10-CM | POA: Diagnosis not present

## 2016-12-24 LAB — BASIC METABOLIC PANEL
ANION GAP: 7 (ref 5–15)
BUN: 25 mg/dL — AB (ref 6–20)
CHLORIDE: 85 mmol/L — AB (ref 101–111)
CO2: 32 mmol/L (ref 22–32)
Calcium: 9.5 mg/dL (ref 8.9–10.3)
Creatinine, Ser: 0.86 mg/dL (ref 0.44–1.00)
GFR calc Af Amer: >60 mL/min (ref >60)
GFR calc non Af Amer: >60 mL/min (ref >60)
GLUCOSE: 79 mg/dL (ref 65–99)
POTASSIUM: 4.2 mmol/L (ref 3.5–5.1)
Sodium: 124 mmol/L — ABNORMAL LOW (ref 135–145)

## 2016-12-27 ENCOUNTER — Other Ambulatory Visit
Admission: RE | Admit: 2016-12-27 | Discharge: 2016-12-27 | Disposition: A | Discharge status: Home / Self Care | Payer: PPO | Source: Skilled Nursing Facility | Attending: Gerontology | Admitting: Gerontology

## 2016-12-27 DIAGNOSIS — E871 Hypo-osmolality and hyponatremia: Secondary | ICD-10-CM | POA: Diagnosis not present

## 2016-12-28 LAB — BASIC METABOLIC PANEL
Anion gap: 6 (ref 5–15)
BUN: 24 mg/dL — AB (ref 6–20)
CALCIUM: 9.5 mg/dL (ref 8.9–10.3)
CO2: 31 mmol/L (ref 22–32)
CREATININE: 0.83 mg/dL (ref 0.44–1.00)
Chloride: 91 mmol/L — ABNORMAL LOW (ref 101–111)
GFR calc Af Amer: >60 mL/min (ref >60)
GLUCOSE: 91 mg/dL (ref 65–99)
Potassium: 4.7 mmol/L (ref 3.5–5.1)
Sodium: 128 mmol/L — ABNORMAL LOW (ref 135–145)

## 2017-01-03 ENCOUNTER — Non-Acute Institutional Stay (SKILLED_NURSING_FACILITY): Payer: PPO | Admitting: Gerontology

## 2017-01-03 ENCOUNTER — Encounter: Payer: Self-pay | Admitting: Gerontology

## 2017-01-03 DIAGNOSIS — M24542 Contracture, left hand: Secondary | ICD-10-CM

## 2017-01-03 DIAGNOSIS — E041 Nontoxic single thyroid nodule: Secondary | ICD-10-CM

## 2017-01-03 DIAGNOSIS — F329 Major depressive disorder, single episode, unspecified: Secondary | ICD-10-CM

## 2017-01-03 DIAGNOSIS — R279 Unspecified lack of coordination: Secondary | ICD-10-CM

## 2017-01-03 DIAGNOSIS — M79602 Pain in left arm: Secondary | ICD-10-CM

## 2017-01-03 DIAGNOSIS — M8580 Other specified disorders of bone density and structure, unspecified site: Secondary | ICD-10-CM

## 2017-01-03 DIAGNOSIS — M21272 Flexion deformity, left ankle and toes: Secondary | ICD-10-CM

## 2017-01-03 DIAGNOSIS — J45909 Unspecified asthma, uncomplicated: Secondary | ICD-10-CM

## 2017-01-03 DIAGNOSIS — I69354 Hemiplegia and hemiparesis following cerebral infarction affecting left non-dominant side: Secondary | ICD-10-CM

## 2017-01-03 DIAGNOSIS — J309 Allergic rhinitis, unspecified: Secondary | ICD-10-CM

## 2017-01-03 DIAGNOSIS — E039 Hypothyroidism, unspecified: Secondary | ICD-10-CM

## 2017-01-03 DIAGNOSIS — E222 Syndrome of inappropriate secretion of antidiuretic hormone: Secondary | ICD-10-CM | POA: Diagnosis not present

## 2017-01-03 DIAGNOSIS — H04123 Dry eye syndrome of bilateral lacrimal glands: Secondary | ICD-10-CM

## 2017-01-03 DIAGNOSIS — I10 Essential (primary) hypertension: Secondary | ICD-10-CM

## 2017-01-03 DIAGNOSIS — I69398 Other sequelae of cerebral infarction: Secondary | ICD-10-CM

## 2017-01-03 DIAGNOSIS — M79605 Pain in left leg: Secondary | ICD-10-CM

## 2017-01-03 DIAGNOSIS — K219 Gastro-esophageal reflux disease without esophagitis: Secondary | ICD-10-CM

## 2017-01-03 DIAGNOSIS — F4310 Post-traumatic stress disorder, unspecified: Secondary | ICD-10-CM

## 2017-01-03 DIAGNOSIS — N3281 Overactive bladder: Secondary | ICD-10-CM

## 2017-01-03 DIAGNOSIS — M19012 Primary osteoarthritis, left shoulder: Secondary | ICD-10-CM

## 2017-01-03 DIAGNOSIS — L84 Corns and callosities: Secondary | ICD-10-CM

## 2017-01-03 DIAGNOSIS — E785 Hyperlipidemia, unspecified: Secondary | ICD-10-CM

## 2017-01-03 DIAGNOSIS — G629 Polyneuropathy, unspecified: Secondary | ICD-10-CM

## 2017-01-07 ENCOUNTER — Encounter
Admission: RE | Admit: 2017-01-07 | Discharge: 2017-01-07 | Disposition: A | Discharge status: Home / Self Care | Payer: PPO | Source: Ambulatory Visit | Attending: Internal Medicine | Admitting: Internal Medicine

## 2017-01-07 DIAGNOSIS — E222 Syndrome of inappropriate secretion of antidiuretic hormone: Secondary | ICD-10-CM | POA: Insufficient documentation

## 2017-01-07 NOTE — Progress Notes (Signed)
Location:   [facility] Room Number: 751W Place of Service:  SNF [phone]) Provider:  Toni Arthurs, NP-C  Frazier Richards, MD  Patient Care Team: Frazier Richards, MD as PCP - General (Family Medicine)  Extended Emergency Contact Information Primary Emergency Contact: Norm Salt Address: [address] [address]          [name] Phone: [phone] Mobile Phone: [phone] Relation: Spouse Secondary Emergency Contact: Barry Dienes, [place] [address] [place] of [place] Phone: [phone] Mobile Phone: [phone] Relation: Daughter  Code Status:  FULL Goals of care: Advanced Directive information Advanced Directives 10/31/2016  Does Patient Have a Medical Advance Directive? No  Type of Advance Directive -  Does patient want to make changes to medical advance directive? -  Copy of [place] in Chart? -  Would patient like information on creating a medical advance directive? -     Chief Complaint  Patient presents with  . Medical Management of Chronic Issues    Routine Visit    HPI:  [name] seen today for medical management of chronic diseases.  Patient has been stable this past month.  Several months ago, patient began having increased symptoms of PTSD and increased agitation.  She was started on prazosin for the PTSD.  At one point, patient requested a dose reduction.  GDR attempted, patient failed.  Symptoms returned including the paranoia.  Dose increased to 3 mg with the addition of Depakote.  She has been on this med regimen for several months and is doing well with it.  Patient is again smiling and seems content.  She is not as tearful or fearful of staff that she was.  Patient also reports improvement in the leg pain.  She participates in most activities and seems to enjoy being around other residents.  Appetite is good.  She is having regular  BMs.  Vital signs stable.  No other complaints.  Past Medical History:  Diagnosis Date  . Anxiety   . Arthritis   . Asthma   . 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 Depression   . Depression with anxiety   . GERD (gastroesophageal reflux disease)   . Hemiparesis affecting left side as late effect of cerebrovascular accident (CVA) ([place])   . History of CVA (cerebrovascular accident)   . Hot flashes   . Hyperlipidemia   . Hyperlipidemia, unspecified   . Hypertension   . Muscle hypertonicity   . Osteoarthritis   . Stroke ([place])   . TIA (transient ischemic attack)    Past Surgical History:  Procedure Laterality Date  . APPENDECTOMY    . COLONOSCOPY  06/17/2008  . EYE SURGERY Bilateral    Cataract Extraction with IOL  . VAGINAL HYSTERECTOMY      Allergies  Allergen Reactions  . Tizanidine Other (See Comments)    HYPOTENSION AND UNRESPONSIVENESS  . Ace Inhibitors Cough  . Latex Hives  . Lisinopril Cough  . Tetracycline Other (See Comments)    Reaction:  Unknown   . Zanaflex [Tizanidine Hcl] Other (See Comments)    "drowsy"    Allergies as of 10/31/2016      Reactions   Tizanidine Other (See Comments)   HYPOTENSION AND UNRESPONSIVENESS   Ace Inhibitors Cough   Latex Hives   Lisinopril Cough   Tetracycline Other (See Comments)  Reaction:  Unknown    Zanaflex [tizanidine Hcl] Other (See Comments)   "drowsy"      Medication List        Accurate as of 10/31/16 11:59 PM. Always use your most recent med list.          acetaminophen 500 MG tablet Commonly known as:  TYLENOL Take 1,000 mg by mouth every 8 (eight) hours as needed for mild pain. Maximum of 3000 mg of acetaminophen in 24 hours , consider all sources  to both as needed, plain  Apap, and as needed oxydocone apap.   alum & mag hydroxide-simeth [phone] MG/5ML suspension Commonly known as:  MAALOX PLUS Take by mouth every 4 (four) hours as needed for indigestion.   amLODipine 5 MG  tablet Commonly known as:  NORVASC Take 5 mg by mouth daily.   BIOTENE MOISTURIZING MOUTH MT Use as directed 2 sprays in the mouth or throat 4 (four) times daily as needed.   CALCIUM 600+D 600-400 MG-UNIT tablet Generic drug:  Calcium Carbonate-Vitamin D Take 1 tablet by mouth 2 (two) times daily.   carvedilol 3.125 MG tablet Commonly known as:  COREG Take 3.125 mg by mouth 2 (two) times daily.   cetirizine 5 MG tablet Commonly known as:  ZYRTEC Take 5 mg by mouth at bedtime.   diclofenac sodium 1 % Gel Commonly known as:  VOLTAREN Apply 4 g topically 2 (two) times daily between meals. Apply to both knees for pain   dipyridamole-aspirin 200-25 MG 12hr capsule Commonly known as:  AGGRENOX Take 1 capsule by mouth every 12 (twelve) hours.   divalproex 125 MG capsule Commonly known as:  DEPAKOTE SPRINKLE Take 250 mg by mouth 2 (two) times daily. 2 caps   ENDIT EX Apply liberal amount topically to areas of skin irritation as needed. Ok to leave at bedside.   gabapentin 400 MG capsule Commonly known as:  NEURONTIN Take 400 mg by mouth 3 (three) times daily.   guaiFENesin 600 MG 12 hr tablet Commonly known as:  MUCINEX Take 1,200 mg by mouth every 12 (twelve) hours.   ipratropium 0.03 % nasal spray Commonly known as:  ATROVENT Place 2 sprays into both nostrils at bedtime.   ipratropium-albuterol 0.5-2.5 (3) MG/3ML Soln Commonly known as:  DUONEB Take 3 mLs by nebulization 3 (three) times daily as needed.   losartan 50 MG tablet Commonly known as:  COZAAR Take 50 mg by mouth 2 (two) times daily.   methocarbamol 500 MG tablet Commonly known as:  ROBAXIN Take 500 mg by mouth every 6 (six) hours as needed for muscle spasms.   montelukast 10 MG tablet Commonly known as:  SINGULAIR Take 10 mg by mouth at bedtime.   nortriptyline 25 MG capsule Commonly known as:  PAMELOR Take 25 mg by mouth at bedtime.   omeprazole 20 MG capsule Commonly known as:  PRILOSEC Take  20 mg by mouth every morning. 6 am   oxybutynin 5 MG tablet Commonly known as:  DITROPAN Take 5 mg by mouth 2 (two) times daily.   potassium chloride 8 MEQ tablet Commonly known as:  KLOR-CON Take 8 mEq by mouth daily.   prazosin 1 MG capsule Commonly known as:  MINIPRESS Take 3 mg by mouth at bedtime. 3 capsules   sennosides-docusate sodium 8.6-50 MG tablet Commonly known as:  SENOKOT-S Take 1 tablet by mouth 2 (two) times daily as needed for constipation.   sennosides-docusate sodium 8.6-50 MG tablet Commonly known as:  SENOKOT-S  Take 1 tablet by mouth every 3 (three) days.   SYSTANE PRESERVATIVE FREE 0.4-0.3 % Soln Generic drug:  Polyethyl Glyc-Propyl Glyc PF Place 2 drops into both eyes 2 (two) times daily.   venlafaxine XR 150 MG 24 hr capsule Commonly known as:  EFFEXOR-XR Take 150 mg by mouth daily with breakfast.       Review of Systems  Constitutional: Negative for activity change, appetite change, chills, diaphoresis and fever.  HENT: Negative for congestion, mouth sores, nosebleeds, postnasal drip, rhinorrhea, sinus pressure, sinus pain, sneezing, sore throat, trouble swallowing and voice change.   Eyes: Negative for pain, redness and visual disturbance.  Respiratory: Negative for apnea, cough, choking, chest tightness, shortness of breath and wheezing.   Cardiovascular: Negative for chest pain, palpitations and leg swelling.  Gastrointestinal: Negative for abdominal distention, abdominal pain, constipation, diarrhea and nausea.  Genitourinary: Negative for difficulty urinating, dysuria, frequency and urgency.  Musculoskeletal: Positive for arthralgias (typical arthritis). Negative for back pain, gait problem and myalgias.  Skin: Negative for color change, pallor, rash and wound.  Neurological: Negative for dizziness, tremors, syncope, speech difficulty, weakness, numbness and headaches.  Psychiatric/Behavioral: Positive for dysphoric mood. Negative for  agitation and behavioral problems.  All other systems reviewed and are negative.   Immunization History  Administered Date(s) Administered  . Influenza Split 09/30/2014  . Influenza Whole 01/08/2004, 10/08/2006, 11/22/2008  . Influenza-Unspecified 08/13/2013, 09/21/2015, 09/25/2016  . Pneumococcal Conjugate-13 08/13/2013  . Pneumococcal Polysaccharide-23 10/24/2000, 11/30/2014  . Td 01/07/2001  . Zoster 01/07/2006   Pertinent  Health Maintenance Due  Topic Date Due  . INFLUENZA VACCINE  Completed  . DEXA SCAN  Completed  . PNA vac Low Risk Adult  Completed   No flowsheet data found. Functional Status Survey:    Vitals:   10/31/16 1126  BP: (!) 118/57  Pulse: 80  Resp: 18  Temp: 97.7 F (36.5 C)  SpO2: 98%  Weight: 187 lb 6.4 oz (85 kg)  Height: 5\' 1"  (1.549 m)   Body mass index is 35.41 kg/m. Physical Exam  Constitutional: She is oriented to person, place, and time. Vital signs are normal. She appears well-developed and well-nourished. She is active and cooperative. She does not appear ill. No distress.  HENT:  Head: Normocephalic and atraumatic.  Mouth/Throat: Uvula is midline, oropharynx is clear and moist and mucous membranes are normal. Mucous membranes are not pale, not dry and not cyanotic.  Eyes: Conjunctivae, EOM and lids are normal. Pupils are equal, round, and reactive to light. Right eye exhibits no discharge. Left eye exhibits no discharge.  Neck: Trachea normal, normal range of motion and full passive range of motion without pain. Neck supple. No JVD present. No tracheal deviation, no edema, no erythema and normal range of motion present. No thyroid mass and no thyromegaly present.  Cardiovascular: Normal rate, regular rhythm, normal heart sounds, intact distal pulses and normal pulses. Exam reveals no gallop, no distant heart sounds and no friction rub.  No murmur heard. Pulses:      Dorsalis pedis pulses are 2+ on the right side, and 2+ on the left side.   Minimal edema  Pulmonary/Chest: Effort normal and breath sounds normal. No accessory muscle usage. No respiratory distress. She has no decreased breath sounds. She has no wheezes. She has no rhonchi. She has no rales. She exhibits no tenderness.  Abdominal: Soft. Normal appearance and bowel sounds are normal. She exhibits no distension and no ascites. There is no tenderness. There is no  guarding.  Musculoskeletal: Normal range of motion. She exhibits no edema, tenderness or deformity.  Expected osteoarthritis, stiffness; calves soft and supple, negative homan's sign  Lymphadenopathy:       Head (right side): No submental, no submandibular and no tonsillar adenopathy present.       Head (left side): No submental, no submandibular and no tonsillar adenopathy present.       Right cervical: No superficial cervical adenopathy present.      Left cervical: No superficial cervical adenopathy present.  Neurological: She is alert and oriented to person, place, and time. She has normal strength. She exhibits abnormal muscle tone. Coordination and gait abnormal.  Skin: Skin is warm, dry and intact. No rash noted. She is not diaphoretic. No cyanosis or erythema. No pallor. Nails show no clubbing.  Corn on Right 5th toe  Psychiatric: Her speech is normal. Judgment normal. Her mood appears anxious. Her affect is blunt. She is withdrawn. Thought content is paranoid and delusional. Cognition and memory are impaired. She exhibits a depressed mood.  Nursing note and vitals reviewed.   Labs reviewed: Recent Labs    05/30/16 0620 09/03/16 0538 09/10/16 0700  NA 133* 128* 133*  K 3.1* 3.4* 3.8  CL 90* 87* 98*  CO2 33* 32 29  GLUCOSE 77 94 80  BUN 18 17 13   CREATININE 0.67 0.92 0.55  CALCIUM 10.0 9.5 9.4  MG  --  1.8  --    Recent Labs    05/30/16 0620 09/03/16 0538  AST 20 16  ALT 13* 10*  ALKPHOS 57 47  BILITOT 0.6 0.4  PROT 6.2* 6.1*  ALBUMIN 3.5 3.4*   Recent Labs    05/30/16 0620  09/03/16 0538  WBC 5.8 6.1  NEUTROABS 3.3 3.3  HGB 11.8* 11.7*  HCT 34.3* 33.4*  MCV 90.0 90.7  PLT 214 208   Lab Results  Component Value Date   TSH 6.980 (H) 09/03/2016   Lab Results  Component Value Date   HGBA1C 5.6 06/23/2009   Lab Results  Component Value Date   CHOL 201 (H) 07/25/2015   HDL 41 07/25/2015   LDLCALC 138 (H) 07/25/2015   LDLDIRECT 161.4 12/14/2008   TRIG 108 07/25/2015   CHOLHDL 4.9 07/25/2015    Significant Diagnostic Results in last 30 days:  No results found.  Assessment/Plan 1. Hemiplegia and hemiparesis following cerebral infarction affecting left non-dominant side (HCC)  Stable  Continue Aggrenox 25-200 po Q 12 hours  2. Other sequelae of cerebral infarction  Stable  3. Pain of left leg  Stable  Continue Methocarbamol 500 mg po Q 6 hours prn  Continue Voltaren 1% gel- 4 grams to the left knee QID  4. Pain of left arm  Stable  Continue Methocarbamol 500 mg po Q 6 hours prn  5. Essential (primary) hypertension  Stable  Disontinue Amlodipine 5 mg po Q Day  Continue Carvedilol 3.125 mg po BID  Discontinue HCTZ 25 mg po Q Day  Discontinue Losartan 50 mg po BID  Spironolactone 25 mg po Q Day  Torsemide 10 mg po Q Day  6. Major depressive disorder with single episode, remission status unspecified  Stable  Continue Depakote sprinkles 2 capsules po BID  Continue Venlafaxine 150 mg po Q Day  7. Hyperlipidemia, unspecified hyperlipidemia type  Stable  Monitor  8. Uncomplicated asthma, unspecified asthma severity, unspecified whether persistent  Stable  Continue Duonebs TID prn for wheezing  Continue Singulair 10 mg po Q HS  Continue Mucinex ER 1200 mg po BID   9. Nontoxic thyroid nodule  stable  10. Flexion deformity, left ankle and toes  Stable  Being followed by Dr Elvina Mattes, podiatry   11. Contracture, left hand  Stable  Continue working with [facility]  12. Lack of  coordination  Stable  Continue working with [facility]  13. Primary osteoarthritis of left shoulder  Stable  Acetaminophen 1,000 mg po TID prn  14. Gastroesophageal reflux disease without esophagitis  Stable  Continue Omeprazole 20 mg po Q Day  15. Hypothyroidism, unspecified type  Stable   16. Posttraumatic stress disorder  Improved  Continue Prazosin 3 mg po Q HS  17. Osteopenia, unspecified location  Stable  Continue Calcium Carbonate-vitamin D3 600-400 mg 1 tablet po BID  18. Allergic rhinitis, unspecified seasonality, unspecified trigger  Stable  Continue Certirizine 5 mg po Q HS  Continue Atrovent nasal spray 0.03%- 2 sprays in each nostril Q HS  19. Neuropathy  Stable  Increase Gabapentin 400 mg po TID  20. Overactive bladder  Stable  Continue Oxybutynin 5 mg po BID  21. Corn of toe  Corn pad to the toe every other day  22. Dry eyes  Stable  Continue Systane 0.4-0.3%- 1 drop in each eye BID   Family/ staff Communication:   Total Time:  Documentation:  Face to Face:  Family/Phone:   Labs/tests ordered: not due  Medication list reviewed and assessed for continued appropriateness. Monthly medication orders reviewed and signed.  Vikki Ports, NP-C Geriatrics [facility] [phone], [place] [address] Cell Phone (Mon-Fri 8am-5pm):  [phone] On Call:  [phone] & follow prompts after 5pm & weekends Office Phone:  [phone] Office Fax:  [phone]

## 2017-01-07 NOTE — Progress Notes (Signed)
Location:   [facility] Room Number: 629B Place of Service:  SNF [phone]) Provider:  Toni Arthurs, NP-C  Frazier Richards, MD  Patient Care Team: Frazier Richards, MD as PCP - General (Family Medicine)  Extended Emergency Contact Information Primary Emergency Contact: Norm Salt Address: [place]          [name] Phone: [phone] Mobile Phone: [phone] Relation: Spouse Secondary Emergency Contact: Barry Dienes, [place] [address] [address] Phone: [phone] Mobile Phone: [phone] Relation: Daughter  Code Status:  FULL Goals of care: Advanced Directive information Advanced Directives 10/31/2016  Does Patient Have a Medical Advance Directive? No  Type of Advance Directive -  Does patient want to make changes to medical advance directive? -  Copy of [place] in Chart? -  Would patient like information on creating a medical advance directive? -     Chief Complaint  Patient presents with  . Medical Management of Chronic Issues    Routine Visit    HPI:  [name] seen today for medical management of chronic diseases.  Patient has been stable this past month. Symptoms of PTSD have decreased with the Prazosin and Depakote. She is doing well with this medication regimen, smiling and more content, no longer tearful or fearful of staff. She reports improvement in leg pain. She participates in most activities and seems to enjoy being around other residents. Appetite is good and is having regular BMs. However, she had begun having worsening SIADH. Will adjust medications. VSS. No other complaints.   Past Medical History:  Diagnosis Date  . Anxiety   . Arthritis   . Asthma   . 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 Depression   . Depression with anxiety   . GERD (gastroesophageal reflux disease)   . Hemiparesis  affecting left side as late effect of cerebrovascular accident (CVA) ([place])   . History of CVA (cerebrovascular accident)   . Hot flashes   . Hyperlipidemia   . Hyperlipidemia, unspecified   . Hypertension   . Muscle hypertonicity   . Osteoarthritis   . Stroke ([place])   . TIA (transient ischemic attack)    Past Surgical History:  Procedure Laterality Date  . APPENDECTOMY    . COLONOSCOPY  06/17/2008  . EYE SURGERY Bilateral    Cataract Extraction with IOL  . VAGINAL HYSTERECTOMY      Allergies  Allergen Reactions  . Tizanidine Other (See Comments)    HYPOTENSION AND UNRESPONSIVENESS  . Ace Inhibitors Cough  . Latex Hives  . Lisinopril Cough  . Tetracycline Other (See Comments)    Reaction:  Unknown   . Zanaflex [Tizanidine Hcl] Other (See Comments)    "drowsy"    Allergies as of 10/31/2016      Reactions   Tizanidine Other (See Comments)   HYPOTENSION AND UNRESPONSIVENESS   Ace Inhibitors Cough   Latex Hives   Lisinopril Cough   Tetracycline Other (See Comments)   Reaction:  Unknown    Zanaflex [tizanidine Hcl] Other (See Comments)   "drowsy"      Medication List        Accurate as of 10/31/16 11:59 PM. Always use your most recent med list.          acetaminophen 500 MG tablet Commonly known as:  TYLENOL Take 1,000  mg by mouth every 8 (eight) hours as needed for mild pain. Maximum of 3000 mg of acetaminophen in 24 hours , consider all sources  to both as needed, plain  Apap, and as needed oxydocone apap.   alum & mag hydroxide-simeth 225-672-09 MG/5ML suspension Commonly known as:  MAALOX PLUS Take by mouth every 4 (four) hours as needed for indigestion.   amLODipine 5 MG tablet Commonly known as:  NORVASC Take 5 mg by mouth daily.   BIOTENE MOISTURIZING MOUTH MT Use as directed 2 sprays in the mouth or throat 4 (four) times daily as needed.   CALCIUM 600+D 600-400 MG-UNIT tablet Generic drug:  Calcium Carbonate-Vitamin D Take 1 tablet by mouth 2  (two) times daily.   carvedilol 3.125 MG tablet Commonly known as:  COREG Take 3.125 mg by mouth 2 (two) times daily.   cetirizine 5 MG tablet Commonly known as:  ZYRTEC Take 5 mg by mouth at bedtime.   diclofenac sodium 1 % Gel Commonly known as:  VOLTAREN Apply 4 g topically 2 (two) times daily between meals. Apply to both knees for pain   dipyridamole-aspirin 200-25 MG 12hr capsule Commonly known as:  AGGRENOX Take 1 capsule by mouth every 12 (twelve) hours.   divalproex 125 MG capsule Commonly known as:  DEPAKOTE SPRINKLE Take 250 mg by mouth 2 (two) times daily. 2 caps   ENDIT EX Apply liberal amount topically to areas of skin irritation as needed. Ok to leave at bedside.   gabapentin 400 MG capsule Commonly known as:  NEURONTIN Take 400 mg by mouth 3 (three) times daily.   guaiFENesin 600 MG 12 hr tablet Commonly known as:  MUCINEX Take 1,200 mg by mouth every 12 (twelve) hours.   ipratropium 0.03 % nasal spray Commonly known as:  ATROVENT Place 2 sprays into both nostrils at bedtime.   ipratropium-albuterol 0.5-2.5 (3) MG/3ML Soln Commonly known as:  DUONEB Take 3 mLs by nebulization 3 (three) times daily as needed.   losartan 50 MG tablet Commonly known as:  COZAAR Take 50 mg by mouth 2 (two) times daily.   methocarbamol 500 MG tablet Commonly known as:  ROBAXIN Take 500 mg by mouth every 6 (six) hours as needed for muscle spasms.   montelukast 10 MG tablet Commonly known as:  SINGULAIR Take 10 mg by mouth at bedtime.   nortriptyline 25 MG capsule Commonly known as:  PAMELOR Take 25 mg by mouth at bedtime.   omeprazole 20 MG capsule Commonly known as:  PRILOSEC Take 20 mg by mouth every morning. 6 am   oxybutynin 5 MG tablet Commonly known as:  DITROPAN Take 5 mg by mouth 2 (two) times daily.   potassium chloride 8 MEQ tablet Commonly known as:  KLOR-CON Take 8 mEq by mouth daily.   prazosin 1 MG capsule Commonly known as:  MINIPRESS Take  3 mg by mouth at bedtime. 3 capsules   sennosides-docusate sodium 8.6-50 MG tablet Commonly known as:  SENOKOT-S Take 1 tablet by mouth 2 (two) times daily as needed for constipation.   sennosides-docusate sodium 8.6-50 MG tablet Commonly known as:  SENOKOT-S Take 1 tablet by mouth every 3 (three) days.   SYSTANE PRESERVATIVE FREE 0.4-0.3 % Soln Generic drug:  Polyethyl Glyc-Propyl Glyc PF Place 2 drops into both eyes 2 (two) times daily.   venlafaxine XR 150 MG 24 hr capsule Commonly known as:  EFFEXOR-XR Take 150 mg by mouth daily with breakfast.  Review of Systems  Constitutional: Negative for activity change, appetite change, chills, diaphoresis and fever.  HENT: Negative for congestion, mouth sores, nosebleeds, postnasal drip, rhinorrhea, sinus pressure, sinus pain, sneezing, sore throat, trouble swallowing and voice change.   Eyes: Negative for pain, redness and visual disturbance.  Respiratory: Negative for apnea, cough, choking, chest tightness, shortness of breath and wheezing.   Cardiovascular: Negative for chest pain, palpitations and leg swelling.  Gastrointestinal: Negative for abdominal distention, abdominal pain, constipation, diarrhea and nausea.  Genitourinary: Negative for difficulty urinating, dysuria, frequency and urgency.  Musculoskeletal: Positive for arthralgias (typical arthritis). Negative for back pain, gait problem and myalgias.  Skin: Negative for color change, pallor, rash and wound.  Neurological: Negative for dizziness, tremors, syncope, speech difficulty, weakness, numbness and headaches.  Psychiatric/Behavioral: Positive for dysphoric mood. Negative for agitation and behavioral problems.  All other systems reviewed and are negative.   Immunization History  Administered Date(s) Administered  . Influenza Split 09/30/2014  . Influenza Whole 01/08/2004, 10/08/2006, 11/22/2008  . Influenza-Unspecified 08/13/2013, 09/21/2015, 09/25/2016  .  Pneumococcal Conjugate-13 08/13/2013  . Pneumococcal Polysaccharide-23 10/24/2000, 11/30/2014  . Td 01/07/2001  . Zoster 01/07/2006   Pertinent  Health Maintenance Due  Topic Date Due  . INFLUENZA VACCINE  Completed  . DEXA SCAN  Completed  . PNA vac Low Risk Adult  Completed   No flowsheet data found. Functional Status Survey:    Vitals:   10/31/16 1126  BP: (!) 118/57  Pulse: 80  Resp: 18  Temp: 97.7 F (36.5 C)  SpO2: 98%  Weight: 187 lb 6.4 oz (85 kg)  Height: 5' 1"  (1.549 m)   Body mass index is 35.41 kg/m. Physical Exam  Constitutional: She is oriented to person, place, and time. Vital signs are normal. She appears well-developed and well-nourished. She is active and cooperative. She does not appear ill. No distress.  HENT:  Head: Normocephalic and atraumatic.  Mouth/Throat: Uvula is midline, oropharynx is clear and moist and mucous membranes are normal. Mucous membranes are not pale, not dry and not cyanotic.  Eyes: Conjunctivae, EOM and lids are normal. Pupils are equal, round, and reactive to light. Right eye exhibits no discharge. Left eye exhibits no discharge.  Neck: Trachea normal, normal range of motion and full passive range of motion without pain. Neck supple. No JVD present. No tracheal deviation, no edema, no erythema and normal range of motion present. No thyroid mass and no thyromegaly present.  Cardiovascular: Normal rate, regular rhythm, normal heart sounds, intact distal pulses and normal pulses. Exam reveals no gallop, no distant heart sounds and no friction rub.  No murmur heard. Pulses:      Dorsalis pedis pulses are 2+ on the right side, and 2+ on the left side.  Minimal edema  Pulmonary/Chest: Effort normal and breath sounds normal. No accessory muscle usage. No respiratory distress. She has no decreased breath sounds. She has no wheezes. She has no rhonchi. She has no rales. She exhibits no tenderness.  Abdominal: Soft. Normal appearance and bowel  sounds are normal. She exhibits no distension and no ascites. There is no tenderness. There is no guarding.  Musculoskeletal: Normal range of motion. She exhibits no edema, tenderness or deformity.  Expected osteoarthritis, stiffness; calves soft and supple, negative homan's sign  Lymphadenopathy:       Head (right side): No submental, no submandibular and no tonsillar adenopathy present.       Head (left side): No submental, no submandibular and no tonsillar adenopathy  present.       Right cervical: No superficial cervical adenopathy present.      Left cervical: No superficial cervical adenopathy present.  Neurological: She is alert and oriented to person, place, and time. She has normal strength. She exhibits abnormal muscle tone. Coordination and gait abnormal.  Skin: Skin is warm, dry and intact. No rash noted. She is not diaphoretic. No cyanosis or erythema. No pallor. Nails show no clubbing.  Corn on Right 5th toe  Psychiatric: Her speech is normal. Judgment normal. Her mood appears anxious. Her affect is blunt. She is withdrawn. Thought content is paranoid and delusional. Cognition and memory are impaired. She exhibits a depressed mood.  Nursing note and vitals reviewed.   Labs reviewed: Recent Labs    05/30/16 0620 09/03/16 0538 09/10/16 0700  NA 133* 128* 133*  K 3.1* 3.4* 3.8  CL 90* 87* 98*  CO2 33* 32 29  GLUCOSE 77 94 80  BUN 18 17 13   CREATININE 0.67 0.92 0.55  CALCIUM 10.0 9.5 9.4  MG  --  1.8  --    Recent Labs    05/30/16 0620 09/03/16 0538  AST 20 16  ALT 13* 10*  ALKPHOS 57 47  BILITOT 0.6 0.4  PROT 6.2* 6.1*  ALBUMIN 3.5 3.4*   Recent Labs    05/30/16 0620 09/03/16 0538  WBC 5.8 6.1  NEUTROABS 3.3 3.3  HGB 11.8* 11.7*  HCT 34.3* 33.4*  MCV 90.0 90.7  PLT 214 208   Lab Results  Component Value Date   TSH 6.980 (H) 09/03/2016   Lab Results  Component Value Date   HGBA1C 5.6 06/23/2009   Lab Results  Component Value Date   CHOL 201 (H)  07/25/2015   HDL 41 07/25/2015   LDLCALC 138 (H) 07/25/2015   LDLDIRECT 161.4 12/14/2008   TRIG 108 07/25/2015   CHOLHDL 4.9 07/25/2015    Significant Diagnostic Results in last 30 days:  No results found.  Assessment/Plan 1. Hemiplegia and hemiparesis following cerebral infarction affecting left non-dominant side (HCC)  Stable  Continue Aggrenox 25-200 po Q 12 hours  2. Other sequelae of cerebral infarction  Stable  3. Pain of left leg  Stable  Continue Methocarbamol 500 mg po Q 6 hours prn  Continue Voltaren 1% gel- 4 grams to the left knee QID  4. Pain of left arm  Stable  Continue Methocarbamol 500 mg po Q 6 hours prn  5. Essential (primary) hypertension  Stable  Continue Carvedilol 3.125 mg po BID  Spironolactone 25 mg po Q Day  Torsemide 20 mg po Q Day  6. Major depressive disorder with single episode, remission status unspecified  Stable  Continue Depakote sprinkles 2 capsules po BID  Continue Venlafaxine 150 mg po Q Day  7. Hyperlipidemia, unspecified hyperlipidemia type  Stable  Monitor  8. Uncomplicated asthma, unspecified asthma severity, unspecified whether persistent  Stable  Continue Duonebs TID prn for wheezing  Continue Singulair 10 mg po Q HS  Continue Mucinex ER 1200 mg po BID   9. Nontoxic thyroid nodule  stable  10. Flexion deformity, left ankle and toes  Stable  Being followed by Dr Elvina Mattes, podiatry   11. Contracture, left hand  Stable  Continue working with [facility]  12. Lack of coordination  Stable  Continue working with [facility]  13. Primary osteoarthritis of left shoulder  Stable  Acetaminophen 1,000 mg po TID prn  14. Gastroesophageal reflux disease without esophagitis  Stable  Continue Omeprazole 20 mg po Q Day  15. Hypothyroidism, unspecified type  Stable   16. Posttraumatic stress disorder  Improved  Continue Prazosin 3 mg po Q HS  17. Osteopenia,  unspecified location  Stable  Continue Calcium Carbonate-vitamin D3 600-400 mg 1 tablet po BID  18. Allergic rhinitis, unspecified seasonality, unspecified trigger  Stable  Continue Certirizine 5 mg po Q HS  Continue Atrovent nasal spray 0.03%- 2 sprays in each nostril Q HS  19. Neuropathy  Stable  Increase Gabapentin 400 mg po TID  20. Overactive bladder  Stable  Continue Oxybutynin 5 mg po BID  21. Corn of toe  Corn pad to the toe every other day  22. Dry eyes  Stable  Continue Systane 0.4-0.3%- 1 drop in each eye BID  23. SIADH (syndrome of inappropriate ADH production)  Sodium Chloride 1 gram po Q Day  Demeclocycline 300 mg po BID  Recheck Met B 1 week  Continue all medications as listed above (01/03/2017)   Family/ staff Communication:   Total Time:  Documentation:  Face to Face:  Family/Phone:   Labs/tests ordered: not due  Medication list reviewed and assessed for continued appropriateness. Monthly medication orders reviewed and signed.  Vikki Ports, NP-C Geriatrics [facility] [phone], [place] [address] Cell Phone (Mon-Fri 8am-5pm):  [phone] On Call:  [phone] & follow prompts after 5pm & weekends Office Phone:  [phone] Office Fax:  [phone]

## 2017-01-09 ENCOUNTER — Other Ambulatory Visit
Admission: RE | Admit: 2017-01-09 | Discharge: 2017-01-09 | Disposition: A | Discharge status: Home / Self Care | Payer: PPO | Source: Ambulatory Visit | Attending: Gerontology | Admitting: Gerontology

## 2017-01-09 DIAGNOSIS — E871 Hypo-osmolality and hyponatremia: Secondary | ICD-10-CM | POA: Insufficient documentation

## 2017-01-09 LAB — BASIC METABOLIC PANEL
Anion gap: 6 (ref 5–15)
BUN: 26 mg/dL — AB (ref 6–20)
CHLORIDE: 95 mmol/L — AB (ref 101–111)
CO2: 32 mmol/L (ref 22–32)
CREATININE: 0.78 mg/dL (ref 0.44–1.00)
Calcium: 9.1 mg/dL (ref 8.9–10.3)
GFR calc Af Amer: >60 mL/min (ref >60)
GFR calc non Af Amer: >60 mL/min (ref >60)
GLUCOSE: 95 mg/dL (ref 65–99)
POTASSIUM: 5 mmol/L (ref 3.5–5.1)
SODIUM: 133 mmol/L — AB (ref 135–145)

## 2017-01-31 DIAGNOSIS — Z0000 Encounter for general adult medical examination without abnormal findings: Secondary | ICD-10-CM | POA: Diagnosis not present

## 2017-01-31 DIAGNOSIS — F325 Major depressive disorder, single episode, in full remission: Secondary | ICD-10-CM | POA: Diagnosis not present

## 2017-01-31 DIAGNOSIS — I69354 Hemiplegia and hemiparesis following cerebral infarction affecting left non-dominant side: Secondary | ICD-10-CM | POA: Diagnosis not present

## 2017-01-31 DIAGNOSIS — I10 Essential (primary) hypertension: Secondary | ICD-10-CM | POA: Diagnosis not present

## 2017-01-31 DIAGNOSIS — E871 Hypo-osmolality and hyponatremia: Secondary | ICD-10-CM | POA: Diagnosis not present

## 2017-01-31 DIAGNOSIS — E7800 Pure hypercholesterolemia, unspecified: Secondary | ICD-10-CM | POA: Diagnosis not present

## 2017-02-03 DIAGNOSIS — M21272 Flexion deformity, left ankle and toes: Secondary | ICD-10-CM | POA: Diagnosis not present

## 2017-02-03 DIAGNOSIS — I69354 Hemiplegia and hemiparesis following cerebral infarction affecting left non-dominant side: Secondary | ICD-10-CM | POA: Diagnosis not present

## 2017-02-03 DIAGNOSIS — M79602 Pain in left arm: Secondary | ICD-10-CM | POA: Diagnosis not present

## 2017-02-03 DIAGNOSIS — I69398 Other sequelae of cerebral infarction: Secondary | ICD-10-CM | POA: Diagnosis not present

## 2017-02-03 DIAGNOSIS — M79605 Pain in left leg: Secondary | ICD-10-CM | POA: Diagnosis not present

## 2017-02-03 DIAGNOSIS — M24542 Contracture, left hand: Secondary | ICD-10-CM | POA: Diagnosis not present

## 2017-02-03 DIAGNOSIS — R279 Unspecified lack of coordination: Secondary | ICD-10-CM | POA: Diagnosis not present

## 2017-02-05 ENCOUNTER — Encounter: Payer: Self-pay | Admitting: Gerontology

## 2017-02-05 ENCOUNTER — Non-Acute Institutional Stay (SKILLED_NURSING_FACILITY): Payer: PPO | Admitting: Gerontology

## 2017-02-05 DIAGNOSIS — I6350 Cerebral infarction due to unspecified occlusion or stenosis of unspecified cerebral artery: Secondary | ICD-10-CM

## 2017-02-05 DIAGNOSIS — E222 Syndrome of inappropriate secretion of antidiuretic hormone: Secondary | ICD-10-CM

## 2017-02-05 DIAGNOSIS — B351 Tinea unguium: Secondary | ICD-10-CM

## 2017-02-07 ENCOUNTER — Encounter
Admission: RE | Admit: 2017-02-07 | Discharge: 2017-02-07 | Disposition: A | Discharge status: Home / Self Care | Payer: PPO | Source: Ambulatory Visit | Attending: Internal Medicine | Admitting: Internal Medicine

## 2017-02-07 DIAGNOSIS — R279 Unspecified lack of coordination: Secondary | ICD-10-CM | POA: Diagnosis not present

## 2017-02-07 DIAGNOSIS — I69354 Hemiplegia and hemiparesis following cerebral infarction affecting left non-dominant side: Secondary | ICD-10-CM | POA: Diagnosis not present

## 2017-02-07 DIAGNOSIS — M21272 Flexion deformity, left ankle and toes: Secondary | ICD-10-CM | POA: Diagnosis not present

## 2017-02-07 DIAGNOSIS — M24542 Contracture, left hand: Secondary | ICD-10-CM | POA: Diagnosis not present

## 2017-02-07 DIAGNOSIS — I69398 Other sequelae of cerebral infarction: Secondary | ICD-10-CM | POA: Diagnosis not present

## 2017-02-07 DIAGNOSIS — M79602 Pain in left arm: Secondary | ICD-10-CM | POA: Diagnosis not present

## 2017-02-07 DIAGNOSIS — M79605 Pain in left leg: Secondary | ICD-10-CM | POA: Diagnosis not present

## 2017-02-12 DIAGNOSIS — B351 Tinea unguium: Secondary | ICD-10-CM | POA: Insufficient documentation

## 2017-02-12 NOTE — Assessment & Plan Note (Signed)
Stable. Most recent NA+ 133 on 01/09/17. Vast improvement from 124. Pt reports she is feeling better. On Demeclocycline 600 mg BID. Failed conservative treatment with free water restriction, Gatorade BID, increased sodium intake, medication adjustments. On Sodium chloride 1 gram daily as supplement. Monitoring closely/ using with caution d/t CHF history.

## 2017-02-12 NOTE — Assessment & Plan Note (Signed)
Stable. Pt is current LTC resident in SNF. Left hemiparesis. Wheelchair bound. No further s/s of CVA

## 2017-02-12 NOTE — Progress Notes (Signed)
Location:    Nursing Home Room Number: 170Y Place of Service:  SNF (31) Provider:  Toni Arthurs, NP-C  [name], MD  Patient Care Team: [name], MD as PCP - General (Family Medicine)  Extended Emergency Contact Information Primary Emergency Contact: Norm Salt Address: [address]          [name] Phone: [phone] Mobile Phone: [phone] Relation: Spouse Secondary Emergency Contact: Barry Dienes, [place] [address] [place] Phone: [phone] Mobile Phone: [phone] Relation: Daughter  Code Status:  DNR Goals of care: Advanced Directive information Advanced Directives 02/05/2017  Does Patient Have a Medical Advance Directive? No  Type of Advance Directive -  Does patient want to make changes to medical advance directive? -  Copy of [place] in Chart? -  Would patient like information on creating a medical advance directive? No - Patient declined     Chief Complaint  Patient presents with  . Medical Management of Chronic Issues    Routine Visit    HPI:  [name] seen today for medical management of chronic diseases.    Onychomycosis New onset. Noticed discoloring of the nails of the Left 3rd, 4th, 5th fingers in the past week. White ridges in nails. Irritation at nail bed. Pt reports she got her nails painted a few weeks ago.  Cerebral artery occlusion with cerebral infarction (HCC) Stable. Pt is current LTC resident in SNF. Left hemiparesis. Wheelchair bound. No further s/s of CVA  SIADH (syndrome of inappropriate ADH production) (HCC) Stable. Most recent NA+ 133 on 01/09/17. Vast improvement from 124. Pt reports she is feeling better. On Demeclocycline 600 mg BID. Failed conservative treatment with free water restriction, Gatorade BID, increased sodium intake, medication adjustments. On Sodium chloride 1 gram daily as  supplement. Monitoring closely/ using with caution d/t CHF history.      Past Medical History:  Diagnosis Date  . Anxiety   . Arthritis   . Asthma   . 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 Depression   . Depression with anxiety   . GERD (gastroesophageal reflux disease)   . Hemiparesis affecting left side as late effect of cerebrovascular accident (CVA) ([place])   . History of CVA (cerebrovascular accident)   . Hot flashes   . Hyperlipidemia   . Hyperlipidemia, unspecified   . Hypertension   . Muscle hypertonicity   . Osteoarthritis   . Stroke ([place])   . TIA (transient ischemic attack)    Past Surgical History:  Procedure Laterality Date  . ACHILLES TENDON LENGTHENING  08/25/2015   Procedure: ACHILLES TENDON LENGTHENING;  Surgeon: Samara Deist, DPM;  Location: [facility] ORS;  Service: Podiatry;;  . APPENDECTOMY    . COLONOSCOPY  06/17/2008  . EYE SURGERY Bilateral    Cataract Extraction with IOL  . FLAT FOOT RECONSTRUCTION-TAL GASTROC RECESSION Left 08/25/2015   Procedure: ENDOSCOPIC FLAT FOOT RECONSTRUCTION-TAL GASTROC RECESSION;  Surgeon: Samara Deist, DPM;  Location: [facility] ORS;  Service: Podiatry;  Laterality: Left;  Marland Kitchen VAGINAL HYSTERECTOMY      Allergies  Allergen Reactions  . Tizanidine Other (See Comments)    HYPOTENSION AND UNRESPONSIVENESS  . Ace Inhibitors Cough  . Latex Hives  . Lisinopril Cough  . Tetracycline Other (See Comments)     REACTION: yeast  . Zanaflex [Tizanidine Hcl] Other (See Comments)    "  drowsy"    Allergies as of 02/05/2017      Reactions   Tizanidine Other (See Comments)   HYPOTENSION AND UNRESPONSIVENESS   Ace Inhibitors Cough   Latex Hives   Lisinopril Cough   Tetracycline Other (See Comments)   REACTION: yeast   Zanaflex [tizanidine Hcl] Other (See Comments)   "drowsy"      Medication List        Accurate as of 02/05/17 11:59 PM. Always use your most recent med list.          acetaminophen 500 MG tablet Commonly  known as:  TYLENOL Take 1,000 mg by mouth every 8 (eight) hours as needed for mild pain. Maximum of 3000 mg of acetaminophen in 24 hours , consider all sources  to both as needed, plain  Apap, and as needed oxydocone apap.   alum & mag hydroxide-simeth 101-751-02 MG/5ML suspension Commonly known as:  MAALOX PLUS Take by mouth every 4 (four) hours as needed for indigestion.   BIOTENE MOISTURIZING MOUTH MT Use as directed 2 sprays in the mouth or throat 4 (four) times daily as needed.   BREO ELLIPTA 100-25 MCG/INH Aepb Generic drug:  fluticasone furoate-vilanterol Inhale 1 puff into the lungs daily.   CALCIUM 600+D 600-400 MG-UNIT tablet Generic drug:  Calcium Carbonate-Vitamin D Take 1 tablet by mouth 2 (two) times daily.   carvedilol 3.125 MG tablet Commonly known as:  COREG Take 3.125 mg by mouth 2 (two) times daily.   cetirizine 5 MG tablet Commonly known as:  ZYRTEC Take 5 mg by mouth at bedtime.   demeclocycline 300 MG tablet Commonly known as:  DECLOMYCIN Take 300 mg by mouth 2 (two) times daily.   diclofenac sodium 1 % Gel Commonly known as:  VOLTAREN Apply 4 g topically 2 (two) times daily between meals. Apply to both knees for pain   dipyridamole-aspirin 200-25 MG 12hr capsule Commonly known as:  AGGRENOX Take 1 capsule by mouth every 12 (twelve) hours.   divalproex 125 MG capsule Commonly known as:  DEPAKOTE SPRINKLE Take 250 mg by mouth 2 (two) times daily. 2 caps   ENDIT EX Apply liberal amount topically to areas of skin irritation as needed. Ok to leave at bedside.   gabapentin 400 MG capsule Commonly known as:  NEURONTIN Take 400 mg by mouth 3 (three) times daily.   guaiFENesin 600 MG 12 hr tablet Commonly known as:  MUCINEX Take 1,200 mg by mouth every 12 (twelve) hours.   ipratropium 0.03 % nasal spray Commonly known as:  ATROVENT Place 2 sprays into both nostrils at bedtime.   ipratropium-albuterol 0.5-2.5 (3) MG/3ML Soln Commonly known as:   DUONEB Take 3 mLs by nebulization 3 (three) times daily as needed.   itraconazole 100 MG capsule Commonly known as:  SPORANOX Take 200 mg by mouth daily.   methocarbamol 500 MG tablet Commonly known as:  ROBAXIN Take 500 mg by mouth every 6 (six) hours as needed for muscle spasms.   montelukast 10 MG tablet Commonly known as:  SINGULAIR Take 10 mg by mouth at bedtime.   omeprazole 20 MG capsule Commonly known as:  PRILOSEC Take 20 mg by mouth every morning. 6 am   oxybutynin 5 MG tablet Commonly known as:  DITROPAN Take 5 mg by mouth 2 (two) times daily.   prazosin 1 MG capsule Commonly known as:  MINIPRESS Take 3 mg by mouth at bedtime. 3 capsules   sennosides-docusate sodium 8.6-50 MG tablet Commonly known as:  SENOKOT-S Take 1 tablet by mouth 2 (two) times daily as needed for constipation.   sennosides-docusate sodium 8.6-50 MG tablet Commonly known as:  SENOKOT-S Take 1 tablet by mouth every 3 (three) days.   sodium chloride 1 g tablet Take 1 g by mouth daily.   spironolactone 25 MG tablet Commonly known as:  ALDACTONE Take 25 mg by mouth daily.   SYSTANE PRESERVATIVE FREE 0.4-0.3 % Soln Generic drug:  Polyethyl Glyc-Propyl Glyc PF Place 2 drops into both eyes 2 (two) times daily.   torsemide 20 MG tablet Commonly known as:  DEMADEX Take 20 mg by mouth daily.   venlafaxine XR 150 MG 24 hr capsule Commonly known as:  EFFEXOR-XR Take 150 mg by mouth daily with breakfast.   VICKS VAPORUB 4.7-1.2-2.6 % Oint Apply liberal amount to the left 3rd, 4th, 5th fingernails at bedtime for onychiomycosis       Review of Systems  Constitutional: Negative for activity change, appetite change, chills, diaphoresis and fever.  HENT: Negative for congestion, mouth sores, nosebleeds, postnasal drip, sneezing, sore throat, trouble swallowing and voice change.   Respiratory: Negative for apnea, cough, choking, chest tightness, shortness of breath and wheezing.     Cardiovascular: Negative for chest pain, palpitations and leg swelling.  Gastrointestinal: Negative for abdominal distention, abdominal pain, constipation, diarrhea and nausea.  Genitourinary: Negative for difficulty urinating, dysuria, frequency and urgency.  Musculoskeletal: Positive for arthralgias (typical arthritis) and gait problem. Negative for back pain and myalgias.  Skin: Negative for color change, pallor, rash and wound.  Neurological: Positive for weakness. Negative for dizziness, tremors, syncope, speech difficulty, numbness and headaches.  Psychiatric/Behavioral: Negative for agitation and behavioral problems.  All other systems reviewed and are negative.   Immunization History  Administered Date(s) Administered  . Influenza Split 09/30/2014  . Influenza Whole 01/08/2004, 10/08/2006, 11/22/2008  . Influenza-Unspecified 08/13/2013, 09/21/2015, 09/25/2016  . Pneumococcal Conjugate-13 08/13/2013  . Pneumococcal Polysaccharide-23 10/24/2000, 11/30/2014  . Td 01/07/2001  . Zoster 01/07/2006   Pertinent  Health Maintenance Due  Topic Date Due  . INFLUENZA VACCINE  Completed  . DEXA SCAN  Completed  . PNA vac Low Risk Adult  Completed   No flowsheet data found. Functional Status Survey:    Vitals:   02/05/17 1052  BP: 110/68  Pulse: 84  Height: _0  (1.549 m)   Body mass index is 36.47 kg/m. Physical Exam  Constitutional: She is oriented to person, place, and time. Vital signs are normal. She appears well-developed and well-nourished. She is active and cooperative. She does not appear ill. No distress.  HENT:  Head: Normocephalic and atraumatic.  Mouth/Throat: Uvula is midline, oropharynx is clear and moist and mucous membranes are normal. Mucous membranes are not pale, not dry and not cyanotic.  Eyes: Conjunctivae, EOM and lids are normal. Pupils are equal, round, and reactive to light.  Neck: Trachea normal, normal range of motion and full passive range of  motion without pain. Neck supple. No JVD present. No tracheal deviation, no edema and no erythema present. No thyromegaly present.  Cardiovascular: Normal rate, regular rhythm, normal heart sounds, intact distal pulses and normal pulses. Exam reveals no gallop, no distant heart sounds and no friction rub.  No murmur heard. Pulses:      Dorsalis pedis pulses are 2+ on the right side, and 2+ on the left side.  No edema  Pulmonary/Chest: Effort normal and breath sounds normal. No accessory muscle usage. No respiratory distress. She has no decreased breath  sounds. She has no wheezes. She has no rhonchi. She has no rales. She exhibits no tenderness.  Abdominal: Soft. Normal appearance and bowel sounds are normal. She exhibits no distension and no ascites. There is no tenderness.  Musculoskeletal: Normal range of motion. She exhibits no edema or tenderness.  Expected osteoarthritis, stiffness; Bilateral Calves soft, supple. Negative Homan's Sign. B- pedal pulses equal; Left hemiparesis, wheelchair bound  Neurological: She is alert and oriented to person, place, and time. She has normal strength. She displays atrophy. A cranial nerve deficit and sensory deficit is present. She exhibits abnormal muscle tone. Coordination and gait abnormal.  Left hemiparesis  Skin: Skin is warm, dry and intact. She is not diaphoretic. No cyanosis. No pallor. Nails show no clubbing.  Onychomycosis of Left 3,4,5 fingernails  Psychiatric: She has a normal mood and affect. Her speech is normal and behavior is normal. Judgment and thought content normal. Cognition and memory are normal.  Nursing note and vitals reviewed.   Labs reviewed: Recent Labs    09/03/16 0538  12/24/16 0410 12/26/16 2210 01/09/17 0412  NA 128*   < > 124* 128* 133*  K 3.4*   < > 4.2 4.7 5.0  CL 87*   < > 85* 91* 95*  CO2 32   < > 32 31 32  GLUCOSE 94   < > 79 91 95  BUN 17   < > 25* 24* 26*  CREATININE 0.92   < > 0.86 0.83 0.78  CALCIUM 9.5    < > 9.5 9.5 9.1  MG 1.8  --   --   --   --    < > = values in this interval not displayed.   Recent Labs    05/30/16 0620 09/03/16 0538  AST 20 16  ALT 13* 10*  ALKPHOS 57 47  BILITOT 0.6 0.4  PROT 6.2* 6.1*  ALBUMIN 3.5 3.4*   Recent Labs    05/30/16 0620 09/03/16 0538  WBC 5.8 6.1  NEUTROABS 3.3 3.3  HGB 11.8* 11.7*  HCT 34.3* 33.4*  MCV 90.0 90.7  PLT 214 208   Lab Results  Component Value Date   TSH 6.980 (H) 09/03/2016   Lab Results  Component Value Date   HGBA1C 5.6 06/23/2009   Lab Results  Component Value Date   CHOL 201 (H) 07/25/2015   HDL 41 07/25/2015   LDLCALC 138 (H) 07/25/2015   LDLDIRECT 161.4 12/14/2008   TRIG 108 07/25/2015   CHOLHDL 4.9 07/25/2015    Significant Diagnostic Results in last 30 days:  No results found.  Assessment/Plan [name] was seen today for medical management of chronic issues.  Diagnoses and all orders for this visit:  Onychomycosis  Cerebral artery occlusion with cerebral infarction ([place])  SIADH (syndrome of inappropriate ADH production) (HCC)   Cerebral artery occlusion and SIADH stable  Continue current medication regimen  Start Itraconazole 200 mg po Q Day x 6 weeks  Vicks Vaporub to fingernails/ nail beds Q HS  Met B in 1 week to re-eval Na+  Continue Aggrenox ER 25-200 po Q 12 hours  Continue restorative nursing program  Family/ staff Communication:   Total Time:  Documentation:  Face to Face:  Family/Phone:   Labs/tests ordered:  Met B  Medication list reviewed and assessed for continued appropriateness. Monthly medication orders reviewed and signed.  Vikki Ports, NP-C Geriatrics [facility] [phone] [address] [place] [address] Cell Phone (Mon-Fri 8am-5pm):  [phone] On Call:  [phone] & follow prompts after 5pm & weekends Office Phone:  [phone] Office Fax:  [phone]

## 2017-02-12 NOTE — Assessment & Plan Note (Addendum)
New onset. Noticed discoloring of the nails of the Left 3rd, 4th, 5th fingers in the past week. White ridges in nails. Irritation at nail bed. Pt reports she got her nails painted a few weeks ago.

## 2017-02-13 ENCOUNTER — Other Ambulatory Visit
Admission: RE | Admit: 2017-02-13 | Discharge: 2017-02-13 | Disposition: A | Discharge status: Home / Self Care | Payer: PPO | Source: Ambulatory Visit | Attending: Gerontology | Admitting: Gerontology

## 2017-02-13 DIAGNOSIS — E871 Hypo-osmolality and hyponatremia: Secondary | ICD-10-CM | POA: Diagnosis not present

## 2017-02-13 LAB — BASIC METABOLIC PANEL
Anion gap: 10 (ref 5–15)
BUN: 22 mg/dL — AB (ref 6–20)
CO2: 28 mmol/L (ref 22–32)
Calcium: 9.3 mg/dL (ref 8.9–10.3)
Chloride: 95 mmol/L — ABNORMAL LOW (ref 101–111)
Creatinine, Ser: 0.93 mg/dL (ref 0.44–1.00)
GFR calc Af Amer: >60 mL/min (ref >60)
GFR, EST NON AFRICAN AMERICAN: 58 mL/min — AB (ref >60)
GLUCOSE: 153 mg/dL — AB (ref 65–99)
POTASSIUM: 3.4 mmol/L — AB (ref 3.5–5.1)
Sodium: 133 mmol/L — ABNORMAL LOW (ref 135–145)

## 2017-03-05 ENCOUNTER — Non-Acute Institutional Stay (SKILLED_NURSING_FACILITY): Payer: PPO | Admitting: Gerontology

## 2017-03-05 ENCOUNTER — Encounter: Payer: Self-pay | Admitting: Gerontology

## 2017-03-05 DIAGNOSIS — J309 Allergic rhinitis, unspecified: Secondary | ICD-10-CM | POA: Diagnosis not present

## 2017-03-05 DIAGNOSIS — J45909 Unspecified asthma, uncomplicated: Secondary | ICD-10-CM | POA: Diagnosis not present

## 2017-03-05 DIAGNOSIS — K219 Gastro-esophageal reflux disease without esophagitis: Secondary | ICD-10-CM | POA: Diagnosis not present

## 2017-03-07 ENCOUNTER — Encounter
Admission: RE | Admit: 2017-03-07 | Discharge: 2017-03-07 | Disposition: A | Discharge status: Home / Self Care | Payer: PPO | Source: Ambulatory Visit | Attending: Internal Medicine | Admitting: Internal Medicine

## 2017-03-10 DIAGNOSIS — M79605 Pain in left leg: Secondary | ICD-10-CM | POA: Diagnosis not present

## 2017-03-10 DIAGNOSIS — M21272 Flexion deformity, left ankle and toes: Secondary | ICD-10-CM | POA: Diagnosis not present

## 2017-03-10 DIAGNOSIS — I69354 Hemiplegia and hemiparesis following cerebral infarction affecting left non-dominant side: Secondary | ICD-10-CM | POA: Diagnosis not present

## 2017-03-10 DIAGNOSIS — I69398 Other sequelae of cerebral infarction: Secondary | ICD-10-CM | POA: Diagnosis not present

## 2017-03-10 DIAGNOSIS — R279 Unspecified lack of coordination: Secondary | ICD-10-CM | POA: Diagnosis not present

## 2017-03-10 DIAGNOSIS — M24542 Contracture, left hand: Secondary | ICD-10-CM | POA: Diagnosis not present

## 2017-03-10 DIAGNOSIS — M79602 Pain in left arm: Secondary | ICD-10-CM | POA: Diagnosis not present

## 2017-03-26 ENCOUNTER — Encounter: Payer: Self-pay | Admitting: Gerontology

## 2017-03-26 ENCOUNTER — Non-Acute Institutional Stay (SKILLED_NURSING_FACILITY): Payer: PPO | Admitting: Gerontology

## 2017-03-26 DIAGNOSIS — K219 Gastro-esophageal reflux disease without esophagitis: Secondary | ICD-10-CM

## 2017-03-26 DIAGNOSIS — R059 Cough, unspecified: Secondary | ICD-10-CM

## 2017-03-26 DIAGNOSIS — R05 Cough: Secondary | ICD-10-CM | POA: Diagnosis not present

## 2017-03-26 NOTE — Progress Notes (Signed)
Location:   [facility] Room Number: [phone] Place of Service:  SNF [phone]) Provider:  Toni Arthurs, NP-C  Kirk Ruths, MD  Patient Care Team: Kirk Ruths, MD as PCP - General (Internal Medicine) Toni Arthurs, NP as Nurse Practitioner (Family Medicine)  Extended Emergency Contact Information Primary Emergency Contact: Norm Salt Address: [address]          [place] Phone: [phone] Mobile Phone: [phone] Relation: Spouse Secondary Emergency Contact: Barry Dienes, [place] [address] [place] of [place] Phone: [phone] Mobile Phone: [phone] Relation: Daughter  Code Status:  Full Goals of care: Advanced Directive information Advanced Directives 03/26/2017  Does Patient Have a Medical Advance Directive? No  Type of Advance Directive -  Does patient want to make changes to medical advance directive? -  Copy of [place] in Chart? -  Would patient like information on creating a medical advance directive? No - Patient declined     Chief Complaint  Patient presents with  . Medical Management of Chronic Issues    Routine Visit    HPI:  [name] seen today for an acute visit for persistent cough. Pt c/o congestion with the cough. She reports the cough is non-productive, dry, irritating. Pt denies chest pain or shortness of breath. Says she has had the cough for 2-3 weeks. Pt is already on Singulair 10 mg po Q HS, Zyrtec 5 mg po Q HS, Breo Ellipta 1 puff Q Day and Mucinex 1200 mg po BID. Also on Omeprazole 20 mg po Q Day. Pt denies nasal congestion, post-nasal drip, orthopnea, DOE. Afebrile. VSS. No other complaints.     Past Medical History:  Diagnosis Date  . Anxiety   . Arthritis   . Asthma   . 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 Depression   . Depression with anxiety   . GERD (gastroesophageal reflux  disease)   . Hemiparesis affecting left side as late effect of cerebrovascular accident (CVA) ([place])   . History of CVA (cerebrovascular accident)   . Hot flashes   . Hyperlipidemia   . Hyperlipidemia, unspecified   . Hypertension   . Muscle hypertonicity   . Osteoarthritis   . Stroke ([place])   . TIA (transient ischemic attack)    Past Surgical History:  Procedure Laterality Date  . ACHILLES TENDON LENGTHENING  08/25/2015   Procedure: ACHILLES TENDON LENGTHENING;  Surgeon: Samara Deist, DPM;  Location: [facility] ORS;  Service: Podiatry;;  . APPENDECTOMY    . COLONOSCOPY  06/17/2008  . EYE SURGERY Bilateral    Cataract Extraction with IOL  . FLAT FOOT RECONSTRUCTION-TAL GASTROC RECESSION Left 08/25/2015   Procedure: ENDOSCOPIC FLAT FOOT RECONSTRUCTION-TAL GASTROC RECESSION;  Surgeon: Samara Deist, DPM;  Location: [facility] ORS;  Service: Podiatry;  Laterality: Left;  Marland Kitchen VAGINAL HYSTERECTOMY      Allergies  Allergen Reactions  . Tizanidine Other (See Comments)    HYPOTENSION AND UNRESPONSIVENESS  . Ace Inhibitors Cough  . Latex Hives  . Lisinopril Cough  . Tetracycline Other (See Comments)     REACTION: yeast  . Zanaflex [Tizanidine Hcl] Other (See Comments)    "drowsy"    Allergies as of 03/26/2017      Reactions   Tizanidine Other (See Comments)   HYPOTENSION AND UNRESPONSIVENESS   Ace Inhibitors Cough   Latex  Hives   Lisinopril Cough   Tetracycline Other (See Comments)   REACTION: yeast   Zanaflex [tizanidine Hcl] Other (See Comments)   "drowsy"      Medication List        Accurate as of 03/26/17 11:54 AM. Always use your most recent med list.          acetaminophen 500 MG tablet Commonly known as:  TYLENOL Take 1,000 mg by mouth every 8 (eight) hours as needed for mild pain. Maximum of 3000 mg of acetaminophen in 24 hours , consider all sources  to both as needed, plain  Apap, and as needed oxydocone apap.   alum & mag hydroxide-simeth 132-440-10 MG/5ML  suspension Commonly known as:  MAALOX PLUS Take by mouth every 4 (four) hours as needed for indigestion.   BIOTENE MOISTURIZING MOUTH MT Use as directed 2 sprays in the mouth or throat 4 (four) times daily as needed.   BREO ELLIPTA 100-25 MCG/INH Aepb Generic drug:  fluticasone furoate-vilanterol Inhale 1 puff into the lungs daily.   CALCIUM 600+D 600-400 MG-UNIT tablet Generic drug:  Calcium Carbonate-Vitamin D Take 1 tablet by mouth 2 (two) times daily.   carvedilol 3.125 MG tablet Commonly known as:  COREG Take 3.125 mg by mouth 2 (two) times daily.   cetirizine 5 MG tablet Commonly known as:  ZYRTEC Take 5 mg by mouth at bedtime.   demeclocycline 300 MG tablet Commonly known as:  DECLOMYCIN Take 300 mg by mouth 2 (two) times daily.   diclofenac sodium 1 % Gel Commonly known as:  VOLTAREN Apply 4 g topically 2 (two) times daily between meals. Apply to both knees for pain   dipyridamole-aspirin 200-25 MG 12hr capsule Commonly known as:  AGGRENOX Take 1 capsule by mouth every 12 (twelve) hours.   divalproex 125 MG capsule Commonly known as:  DEPAKOTE SPRINKLE Take 250 mg by mouth 2 (two) times daily. 2 caps   ENDIT EX Apply liberal amount topically to areas of skin irritation as needed. Ok to leave at bedside.   gabapentin 400 MG capsule Commonly known as:  NEURONTIN Take 400 mg by mouth 3 (three) times daily.   guaiFENesin 600 MG 12 hr tablet Commonly known as:  MUCINEX Take 1,200 mg by mouth every 12 (twelve) hours.   ipratropium 0.03 % nasal spray Commonly known as:  ATROVENT Place 2 sprays into both nostrils at bedtime.   ipratropium-albuterol 0.5-2.5 (3) MG/3ML Soln Commonly known as:  DUONEB Take 3 mLs by nebulization 3 (three) times daily as needed.   methocarbamol 500 MG tablet Commonly known as:  ROBAXIN Take 500 mg by mouth every 6 (six) hours as needed for muscle spasms.   montelukast 10 MG tablet Commonly known as:  SINGULAIR Take 10 mg by  mouth at bedtime.   omeprazole 20 MG capsule Commonly known as:  PRILOSEC Take 20 mg by mouth 2 (two) times daily.   oxybutynin 5 MG tablet Commonly known as:  DITROPAN Take 5 mg by mouth 2 (two) times daily.   prazosin 1 MG capsule Commonly known as:  MINIPRESS Take 3 mg by mouth at bedtime. 3 capsules   Salicylic Acid 40 % Pads Apply 1 corn pad to Right 5th toe daily until corn resolved   sennosides-docusate sodium 8.6-50 MG tablet Commonly known as:  SENOKOT-S Take 1 tablet by mouth 2 (two) times daily as needed for constipation.   sennosides-docusate sodium 8.6-50 MG tablet Commonly known as:  SENOKOT-S Take 1 tablet by  mouth every 3 (three) days.   sodium chloride 1 g tablet Take 1 g by mouth daily.   spironolactone 50 MG tablet Commonly known as:  ALDACTONE Take 50 mg by mouth daily.   SYSTANE PRESERVATIVE FREE 0.4-0.3 % Soln Generic drug:  Polyethyl Glyc-Propyl Glyc PF Place 2 drops into both eyes 2 (two) times daily.   torsemide 20 MG tablet Commonly known as:  DEMADEX Take 20 mg by mouth daily.   venlafaxine XR 150 MG 24 hr capsule Commonly known as:  EFFEXOR-XR Take 150 mg by mouth daily with breakfast.   VICKS VAPORUB 4.7-1.2-2.6 % Oint Apply liberal amount to the left 3rd, 4th, 5th fingernails at bedtime for onychiomycosis       Review of Systems  Constitutional: Negative for activity change, appetite change, chills, diaphoresis and fever.  HENT: Positive for congestion. Negative for mouth sores, nosebleeds, postnasal drip, sneezing, sore throat, trouble swallowing and voice change.   Respiratory: Positive for cough. Negative for apnea, choking, chest tightness, shortness of breath and wheezing.   Cardiovascular: Negative for chest pain, palpitations and leg swelling.  Gastrointestinal: Negative for abdominal distention, abdominal pain, constipation, diarrhea and nausea.  Genitourinary: Negative for difficulty urinating, dysuria, frequency and  urgency.  Musculoskeletal: Negative for back pain, gait problem and myalgias. Arthralgias: typical arthritis.  Skin: Negative for color change, pallor, rash and wound.  Neurological: Negative for dizziness, tremors, syncope, speech difficulty, weakness, numbness and headaches.  Psychiatric/Behavioral: Negative for agitation and behavioral problems.  All other systems reviewed and are negative.   Immunization History  Administered Date(s) Administered  . Influenza Split 09/30/2014  . Influenza Whole 01/08/2004, 10/08/2006, 11/22/2008  . Influenza-Unspecified 08/13/2013, 09/21/2015, 09/25/2016  . Pneumococcal Conjugate-13 08/13/2013  . Pneumococcal Polysaccharide-23 10/24/2000, 11/30/2014  . Td 01/07/2001  . Zoster 01/07/2006   Pertinent  Health Maintenance Due  Topic Date Due  . INFLUENZA VACCINE  Completed  . DEXA SCAN  Completed  . PNA vac Low Risk Adult  Completed   No flowsheet data found. Functional Status Survey:    Vitals:   03/26/17 1137  BP: 108/68  Pulse: 62  Resp: 16  Temp: 98.7 F (37.1 C)  TempSrc: Oral  SpO2: 94%  Weight: 182 lb 8 oz (82.8 kg)  Height: 5\' 1"  (1.549 m)   Body mass index is 34.48 kg/m. Physical Exam  Constitutional: She is oriented to person, place, and time. Vital signs are normal. She appears well-developed and well-nourished. She is active and cooperative. She does not appear ill. No distress.  HENT:  Head: Normocephalic and atraumatic.  Mouth/Throat: Uvula is midline, oropharynx is clear and moist and mucous membranes are normal. Mucous membranes are not pale, not dry and not cyanotic.  Hoarse voice  Eyes: Pupils are equal, round, and reactive to light. Conjunctivae, EOM and lids are normal.  Neck: Trachea normal, normal range of motion and full passive range of motion without pain. Neck supple. No JVD present. No tracheal deviation, no edema and no erythema present. No thyromegaly present.  Cardiovascular: Normal rate, regular rhythm,  normal heart sounds, intact distal pulses and normal pulses. Exam reveals no gallop, no distant heart sounds and no friction rub.  No murmur heard. Pulses:      Dorsalis pedis pulses are 2+ on the right side, and 2+ on the left side.  No edema  Pulmonary/Chest: Effort normal and breath sounds normal. No accessory muscle usage. No respiratory distress. She has no decreased breath sounds. She has no wheezes. She  has no rhonchi. She has no rales. She exhibits no tenderness.  Cough-nonproductive  Abdominal: Soft. Normal appearance and bowel sounds are normal. She exhibits no distension and no ascites. There is no tenderness.  Musculoskeletal: Normal range of motion. She exhibits no edema or tenderness.  Expected osteoarthritis, stiffness; Bilateral Calves soft, supple. Negative Homan's Sign. B- pedal pulses equal; Left Hemiplegia, wheelchair bound  Neurological: She is alert and oriented to person, place, and time. She has normal strength. She displays atrophy. A cranial nerve deficit is present. She exhibits abnormal muscle tone. Coordination and gait abnormal.  Left Hemiplegia  Skin: Skin is warm, dry and intact. She is not diaphoretic. No cyanosis. No pallor. Nails show no clubbing.  Psychiatric: She has a normal mood and affect. Her speech is normal and behavior is normal. Judgment and thought content normal. Cognition and memory are normal.  Nursing note and vitals reviewed.   Labs reviewed: Recent Labs    09/03/16 0538  12/26/16 2210 01/09/17 0412 02/13/17 1350  NA 128*   < > 128* 133* 133*  K 3.4*   < > 4.7 5.0 3.4*  CL 87*   < > 91* 95* 95*  CO2 32   < > 31 32 28  GLUCOSE 94   < > 91 95 153*  BUN 17   < > 24* 26* 22*  CREATININE 0.92   < > 0.83 0.78 0.93  CALCIUM 9.5   < > 9.5 9.1 9.3  MG 1.8  --   --   --   --    < > = values in this interval not displayed.   Recent Labs    05/30/16 0620 09/03/16 0538  AST 20 16  ALT 13* 10*  ALKPHOS 57 47  BILITOT 0.6 0.4  PROT 6.2*  6.1*  ALBUMIN 3.5 3.4*   Recent Labs    05/30/16 0620 09/03/16 0538  WBC 5.8 6.1  NEUTROABS 3.3 3.3  HGB 11.8* 11.7*  HCT 34.3* 33.4*  MCV 90.0 90.7  PLT 214 208   Lab Results  Component Value Date   TSH 6.980 (H) 09/03/2016   Lab Results  Component Value Date   HGBA1C 5.6 06/23/2009   Lab Results  Component Value Date   CHOL 201 (H) 07/25/2015   HDL 41 07/25/2015   LDLCALC 138 (H) 07/25/2015   LDLDIRECT 161.4 12/14/2008   TRIG 108 07/25/2015   CHOLHDL 4.9 07/25/2015    Significant Diagnostic Results in last 30 days:  No results found.  Assessment/Plan  Gastroesophageal reflux disease without esophagitis Cough  Continue current medication regimen  Increase Omeprazole to 20 mg po BID  Duonebs TID scheduled  Monitor for fever  Monitor for worsening cough, orthopnea  Monitor for worsening edema  Family/ staff Communication:   Total Time:  Documentation:  Face to Face:  Family/Phone:   Labs/tests ordered:    Medication list reviewed and assessed for continued appropriateness.  Vikki Ports, NP-C Geriatrics [facility] [phone], [place] [address] Cell Phone (Mon-Fri 8am-5pm):  [phone] On Call:  [phone] & follow prompts after 5pm & weekends Office Phone:  [phone] Office Fax:  [phone]

## 2017-04-07 ENCOUNTER — Encounter
Admission: RE | Admit: 2017-04-07 | Discharge: 2017-04-07 | Disposition: A | Discharge status: Home / Self Care | Payer: PPO | Source: Ambulatory Visit | Attending: Internal Medicine | Admitting: Internal Medicine

## 2017-04-08 DIAGNOSIS — M79602 Pain in left arm: Secondary | ICD-10-CM | POA: Diagnosis not present

## 2017-04-08 DIAGNOSIS — M21272 Flexion deformity, left ankle and toes: Secondary | ICD-10-CM | POA: Diagnosis not present

## 2017-04-08 DIAGNOSIS — M24542 Contracture, left hand: Secondary | ICD-10-CM | POA: Diagnosis not present

## 2017-04-08 DIAGNOSIS — I69354 Hemiplegia and hemiparesis following cerebral infarction affecting left non-dominant side: Secondary | ICD-10-CM | POA: Diagnosis not present

## 2017-04-08 DIAGNOSIS — M79605 Pain in left leg: Secondary | ICD-10-CM | POA: Diagnosis not present

## 2017-04-08 DIAGNOSIS — R279 Unspecified lack of coordination: Secondary | ICD-10-CM | POA: Diagnosis not present

## 2017-04-08 DIAGNOSIS — I69398 Other sequelae of cerebral infarction: Secondary | ICD-10-CM | POA: Diagnosis not present

## 2017-04-11 DIAGNOSIS — I10 Essential (primary) hypertension: Secondary | ICD-10-CM | POA: Diagnosis not present

## 2017-04-11 DIAGNOSIS — E871 Hypo-osmolality and hyponatremia: Secondary | ICD-10-CM | POA: Diagnosis not present

## 2017-04-11 DIAGNOSIS — I69354 Hemiplegia and hemiparesis following cerebral infarction affecting left non-dominant side: Secondary | ICD-10-CM | POA: Diagnosis not present

## 2017-04-11 DIAGNOSIS — F325 Major depressive disorder, single episode, in full remission: Secondary | ICD-10-CM | POA: Diagnosis not present

## 2017-04-18 NOTE — Assessment & Plan Note (Signed)
Stable. Symptoms controlled with Atrovent 0.03% nasal spray- 2 sprays in each nostril at bedtime, singulair 10 mg po Q HS and Zyrtec 5 mg po Q HS. Pt denies increased sx of PND.

## 2017-04-18 NOTE — Progress Notes (Signed)
Location:    [facility]ursing Home Room Number: 546T Place of Service:  SNF (31) Provider:  Toni Arthurs, NP-C  Kirk Ruths, MD  Patient Care Team: Kirk Ruths, MD as PCP - General (Internal Medicine) Toni Arthurs, NP as Nurse Practitioner (Family Medicine)  Extended Emergency Contact Information Primary Emergency Contact: Norm Salt Address: [address]          [name] Phone: [phone] Mobile Phone: [phone] Relation: Spouse Secondary Emergency Contact: Barry Dienes, [place] [address] [place] of [place] Phone: [phone] Mobile Phone: [phone] Relation: Daughter  Code Status:  DNR Goals of care: Advanced Directive information Advanced Directives 03/26/2017  Does Patient Have a Medical Advance Directive? No  Type of Advance Directive -  Does patient want to make changes to medical advance directive? -  Copy of [place] in Chart? -  Would patient like information on creating a medical advance directive? No - Patient declined     Chief Complaint  Patient presents with  . Medical Management of Chronic Issues    Routine Visit    HPI:  [name] seen today for medical management of chronic diseases.    Uncomplicated asthma Progressive. Pt having intermittent asthma exacerbations. Will occasionally c/o congested sounding, non-productive cough. Or cough with clear sputum at times. Pt has been afebrile. Lungs clear, but diminished. Sx stable on TID scheduled Duonebs and Breo Ellipta 1 puff Q Day. Pt also on Mucinex 600 mg 2 tablets BID and Singulair 10 mg po Q HS.  Allergic rhinitis Stable. Symptoms controlled with Atrovent 0.03% nasal spray- 2 sprays in each nostril at bedtime, singulair 10 mg po Q HS and Zyrtec 5 mg po Q HS. Pt denies increased sx of PND.   Gastroesophageal reflux disease without esophagitis Stable. Pt on Omeprazole 20 mg po  BID and Q 4 hour prn Maalox for intermittent GERD exacerbations. No recent c/o increased symptoms. Will monitor the chronic cough associated with asthma for possibility of being related to GERD.     Past Medical History:  Diagnosis Date  . Anxiety   . Arthritis   . Asthma   . 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 Depression   . Depression with anxiety   . GERD (gastroesophageal reflux disease)   . Hemiparesis affecting left side as late effect of cerebrovascular accident (CVA) ([place])   . History of CVA (cerebrovascular accident)   . Hot flashes   . Hyperlipidemia   . Hyperlipidemia, unspecified   . Hypertension   . Muscle hypertonicity   . Osteoarthritis   . Stroke ([place])   . TIA (transient ischemic attack)    Past Surgical History:  Procedure Laterality Date  . ACHILLES TENDON LENGTHENING  08/25/2015   Procedure: ACHILLES TENDON LENGTHENING;  Surgeon: Samara Deist, DPM;  Location: [facility] ORS;  Service: Podiatry;;  . APPENDECTOMY    . COLONOSCOPY  06/17/2008  . EYE SURGERY Bilateral    Cataract Extraction with IOL  . FLAT FOOT RECONSTRUCTION-TAL GASTROC RECESSION Left 08/25/2015   Procedure: ENDOSCOPIC FLAT FOOT RECONSTRUCTION-TAL GASTROC RECESSION;  Surgeon: Samara Deist, DPM;  Location: [facility] ORS;  Service: Podiatry;  Laterality: Left;  Marland Kitchen VAGINAL HYSTERECTOMY      Allergies  Allergen Reactions  . Tizanidine Other (See Comments)    HYPOTENSION AND UNRESPONSIVENESS  . Ace Inhibitors Cough  . Latex Hives  .  Lisinopril Cough  . Tetracycline Other (See Comments)     REACTION: yeast  . Zanaflex [Tizanidine Hcl] Other (See Comments)    "drowsy"    Allergies as of 03/05/2017      Reactions   Tizanidine Other (See Comments)   HYPOTENSION AND UNRESPONSIVENESS   Ace Inhibitors Cough   Latex Hives   Lisinopril Cough   Tetracycline Other (See Comments)   REACTION: yeast   Zanaflex [tizanidine Hcl] Other (See Comments)   "drowsy"      Medication List         Accurate as of 03/05/17 11:59 PM. Always use your most recent med list.          acetaminophen 500 MG tablet Commonly known as:  TYLENOL Take 1,000 mg by mouth every 8 (eight) hours as needed for mild pain. Maximum of 3000 mg of acetaminophen in 24 hours , consider all sources  to both as needed, plain  Apap, and as needed oxydocone apap.   alum & mag hydroxide-simeth 732-202-54 MG/5ML suspension Commonly known as:  MAALOX PLUS Take by mouth every 4 (four) hours as needed for indigestion.   BIOTENE MOISTURIZING MOUTH MT Use as directed 2 sprays in the mouth or throat 4 (four) times daily as needed.   BREO ELLIPTA 100-25 MCG/INH Aepb Generic drug:  fluticasone furoate-vilanterol Inhale 1 puff into the lungs daily.   CALCIUM 600+D 600-400 MG-UNIT tablet Generic drug:  Calcium Carbonate-Vitamin D Take 1 tablet by mouth 2 (two) times daily.   carvedilol 3.125 MG tablet Commonly known as:  COREG Take 3.125 mg by mouth 2 (two) times daily.   cetirizine 5 MG tablet Commonly known as:  ZYRTEC Take 5 mg by mouth at bedtime.   demeclocycline 300 MG tablet Commonly known as:  DECLOMYCIN Take 300 mg by mouth 2 (two) times daily.   diclofenac sodium 1 % Gel Commonly known as:  VOLTAREN Apply 4 g topically 2 (two) times daily between meals. Apply to both knees for pain   dipyridamole-aspirin 200-25 MG 12hr capsule Commonly known as:  AGGRENOX Take 1 capsule by mouth every 12 (twelve) hours.   divalproex 125 MG capsule Commonly known as:  DEPAKOTE SPRINKLE Take 250 mg by mouth 2 (two) times daily. 2 caps   ENDIT EX Apply liberal amount topically to areas of skin irritation as needed. Ok to leave at bedside.   gabapentin 400 MG capsule Commonly known as:  NEURONTIN Take 400 mg by mouth 3 (three) times daily.   guaiFENesin 600 MG 12 hr tablet Commonly known as:  MUCINEX Take 1,200 mg by mouth every 12 (twelve) hours.   ipratropium 0.03 % nasal spray Commonly known as:   ATROVENT Place 2 sprays into both nostrils at bedtime.   ipratropium-albuterol 0.5-2.5 (3) MG/3ML Soln Commonly known as:  DUONEB Take 3 mLs by nebulization 3 (three) times daily as needed.   itraconazole 100 MG capsule Commonly known as:  SPORANOX Take 200 mg by mouth daily.   methocarbamol 500 MG tablet Commonly known as:  ROBAXIN Take 500 mg by mouth every 6 (six) hours as needed for muscle spasms.   montelukast 10 MG tablet Commonly known as:  SINGULAIR Take 10 mg by mouth at bedtime.   omeprazole 20 MG capsule Commonly known as:  PRILOSEC Take 20 mg by mouth 2 (two) times daily.   oxybutynin 5 MG tablet Commonly known as:  DITROPAN Take 5 mg by mouth 2 (two) times daily.   prazosin 1 MG  capsule Commonly known as:  MINIPRESS Take 3 mg by mouth at bedtime. 3 capsules   Salicylic Acid 40 % Pads Apply 1 corn pad to Right 5th toe daily until corn resolved   sennosides-docusate sodium 8.6-50 MG tablet Commonly known as:  SENOKOT-S Take 1 tablet by mouth 2 (two) times daily as needed for constipation.   sennosides-docusate sodium 8.6-50 MG tablet Commonly known as:  SENOKOT-S Take 1 tablet by mouth every 3 (three) days.   sodium chloride 1 g tablet Take 1 g by mouth daily.   spironolactone 50 MG tablet Commonly known as:  ALDACTONE Take 50 mg by mouth daily.   SYSTANE PRESERVATIVE FREE 0.4-0.3 % Soln Generic drug:  Polyethyl Glyc-Propyl Glyc PF Place 2 drops into both eyes 2 (two) times daily.   torsemide 20 MG tablet Commonly known as:  DEMADEX Take 20 mg by mouth daily.   venlafaxine XR 150 MG 24 hr capsule Commonly known as:  EFFEXOR-XR Take 150 mg by mouth daily with breakfast.   VICKS VAPORUB 4.7-1.2-2.6 % Oint Apply liberal amount to the left 3rd, 4th, 5th fingernails at bedtime for onychiomycosis       Review of Systems  Constitutional: Negative for activity change, appetite change, chills, diaphoresis and fever.  HENT: Negative for  congestion, mouth sores, nosebleeds, postnasal drip, sneezing, sore throat, trouble swallowing and voice change.   Respiratory: Positive for cough and shortness of breath (at times). Negative for apnea, choking, chest tightness and wheezing.   Cardiovascular: Negative for chest pain, palpitations and leg swelling.  Gastrointestinal: Negative for abdominal distention, abdominal pain, constipation, diarrhea and nausea.  Genitourinary: Negative for difficulty urinating, dysuria, frequency and urgency.  Musculoskeletal: Positive for arthralgias (typical arthritis). Negative for back pain, gait problem and myalgias.  Skin: Negative for color change, pallor, rash and wound.  Neurological: Positive for speech difficulty and weakness. Negative for dizziness, tremors, syncope, facial asymmetry, light-headedness, numbness and headaches.  Psychiatric/Behavioral: Negative for agitation and behavioral problems.  All other systems reviewed and are negative.   Immunization History  Administered Date(s) Administered  . Influenza Split 09/30/2014  . Influenza Whole 01/08/2004, 10/08/2006, 11/22/2008  . Influenza-Unspecified 08/13/2013, 09/21/2015, 09/25/2016  . Pneumococcal Conjugate-13 08/13/2013  . Pneumococcal Polysaccharide-23 10/24/2000, 11/30/2014  . Td 01/07/2001  . Zoster 01/07/2006   Pertinent  Health Maintenance Due  Topic Date Due  . INFLUENZA VACCINE  08/07/2017  . DEXA SCAN  Completed  . PNA vac Low Risk Adult  Completed   No flowsheet data found. Functional Status Survey:    Vitals:   03/05/17 1043  BP: (!) 108/57  Pulse: 82  Resp: 20  Temp: 98.2 F (36.8 C)  TempSrc: Oral  SpO2: 94%  Weight: 185 lb 14.4 oz (84.3 kg)  Height: 5\' 1"  (1.549 m)   Body mass index is 35.13 kg/m. Physical Exam  Constitutional: She is oriented to person, place, and time. Vital signs are normal. She appears well-developed and well-nourished. She is active and cooperative. She does not appear ill.  No distress.  HENT:  Head: Normocephalic and atraumatic.  Mouth/Throat: Uvula is midline, oropharynx is clear and moist and mucous membranes are normal. Mucous membranes are not pale, not dry and not cyanotic.  Eyes: Pupils are equal, round, and reactive to light. Conjunctivae, EOM and lids are normal.  Neck: Trachea normal, normal range of motion and full passive range of motion without pain. Neck supple. No JVD present. No tracheal deviation, no edema and no erythema present. No thyromegaly  present.  Cardiovascular: Normal rate, regular rhythm, normal heart sounds, intact distal pulses and normal pulses. Exam reveals no gallop, no distant heart sounds and no friction rub.  No murmur heard. Pulses:      Dorsalis pedis pulses are 2+ on the right side, and 2+ on the left side.  No edema  Pulmonary/Chest: Effort normal and breath sounds normal. No accessory muscle usage. No respiratory distress. She has no decreased breath sounds. She has no wheezes. She has no rhonchi. She has no rales. She exhibits no tenderness.  Abdominal: Soft. Normal appearance and bowel sounds are normal. She exhibits no distension and no ascites. There is no tenderness.  Musculoskeletal: Normal range of motion. She exhibits no edema or tenderness.  Expected osteoarthritis, stiffness; Bilateral Calves soft, supple. Negative Homan's Sign. B- pedal pulses equal; generalized weakness and Left Hemi-paresis. Wheelchair bound  Neurological: She is alert and oriented to person, place, and time. She has normal strength. She displays atrophy. A cranial nerve deficit and sensory deficit is present. She exhibits abnormal muscle tone. Coordination and gait abnormal.  Left Hemiparesis r/t CVA  Skin: Skin is warm, dry and intact. She is not diaphoretic. No cyanosis. No pallor. Nails show no clubbing.  Psychiatric: Her speech is normal and behavior is normal. Judgment and thought content normal. Her affect is blunt. Cognition and memory are  normal. She exhibits a depressed mood.  Nursing note and vitals reviewed.   Labs reviewed: Recent Labs    09/03/16 0538  12/26/16 2210 01/09/17 0412 02/13/17 1350  NA 128*   < > 128* 133* 133*  K 3.4*   < > 4.7 5.0 3.4*  CL 87*   < > 91* 95* 95*  CO2 32   < > 31 32 28  GLUCOSE 94   < > 91 95 153*  BUN 17   < > 24* 26* 22*  CREATININE 0.92   < > 0.83 0.78 0.93  CALCIUM 9.5   < > 9.5 9.1 9.3  MG 1.8  --   --   --   --    < > = values in this interval not displayed.   Recent Labs    05/30/16 0620 09/03/16 0538  AST 20 16  ALT 13* 10*  ALKPHOS 57 47  BILITOT 0.6 0.4  PROT 6.2* 6.1*  ALBUMIN 3.5 3.4*   Recent Labs    05/30/16 0620 09/03/16 0538  WBC 5.8 6.1  NEUTROABS 3.3 3.3  HGB 11.8* 11.7*  HCT 34.3* 33.4*  MCV 90.0 90.7  PLT 214 208   Lab Results  Component Value Date   TSH 6.980 (H) 09/03/2016   Lab Results  Component Value Date   HGBA1C 5.6 06/23/2009   Lab Results  Component Value Date   CHOL 201 (H) 07/25/2015   HDL 41 07/25/2015   LDLCALC 138 (H) 07/25/2015   LDLDIRECT 161.4 12/14/2008   TRIG 108 07/25/2015   CHOLHDL 4.9 07/25/2015    Significant Diagnostic Results in last 30 days:  No results found.  Assessment/Plan [name] was seen today for medical management of chronic issues.  Diagnoses and all orders for this visit:  Uncomplicated asthma, unspecified asthma severity, unspecified whether persistent  Allergic rhinitis, unspecified seasonality, unspecified trigger  Gastroesophageal reflux disease without esophagitis   Above listed conditions stable  Continue current medication regimen  Continue to encourage pt to be upright in the chair for at least 30 minutes after eating  Monitor breath sounds and O2 sats for  worsening asthma, etc  Monitor for need to increase frequency of atrovent nasal spray  Family/ staff Communication:   Total Time:  Documentation:  Face to Face:  Family/Phone:   Labs/tests ordered:  Not  due  Medication list reviewed and assessed for continued appropriateness. Monthly medication orders reviewed and signed.  Vikki Ports, NP-C Geriatric[facility] [phone], [place] [address] Cell Phone (Mon-Fri 8am-5pm):  [phone] On Call:  [phone] & follow prompts after 5pm & weekends Office Phone:  [phone] Office Fax:  [phone]

## 2017-04-18 NOTE — Assessment & Plan Note (Signed)
Progressive. Pt having intermittent asthma exacerbations. Will occasionally c/o congested sounding, non-productive cough. Or cough with clear sputum at times. Pt has been afebrile. Lungs clear, but diminished. Sx stable on TID scheduled Duonebs and Breo Ellipta 1 puff Q Day. Pt also on Mucinex 600 mg 2 tablets BID and Singulair 10 mg po Q HS.

## 2017-04-18 NOTE — Assessment & Plan Note (Signed)
Stable. Pt on Omeprazole 20 mg po BID and Q 4 hour prn Maalox for intermittent GERD exacerbations. No recent c/o increased symptoms. Will monitor the chronic cough associated with asthma for possibility of being related to GERD.

## 2017-04-24 ENCOUNTER — Other Ambulatory Visit
Admission: RE | Admit: 2017-04-24 | Discharge: 2017-04-24 | Disposition: A | Discharge status: Home / Self Care | Payer: PPO | Source: Ambulatory Visit | Attending: Gerontology | Admitting: Gerontology

## 2017-04-24 DIAGNOSIS — I10 Essential (primary) hypertension: Secondary | ICD-10-CM | POA: Insufficient documentation

## 2017-04-24 LAB — CBC WITH DIFFERENTIAL/PLATELET
BASOS PCT: 0 %
Basophils Absolute: 0 10*3/uL (ref 0–0.1)
EOS ABS: 0.2 10*3/uL (ref 0–0.7)
Eosinophils Relative: 3 %
HCT: 33.7 % — ABNORMAL LOW (ref 35.0–47.0)
HEMOGLOBIN: 11.4 g/dL — AB (ref 12.0–16.0)
Lymphocytes Relative: 32 %
Lymphs Abs: 1.9 10*3/uL (ref 1.0–3.6)
MCH: 32.3 pg (ref 26.0–34.0)
MCHC: 33.9 g/dL (ref 32.0–36.0)
MCV: 95.2 fL (ref 80.0–100.0)
MONOS PCT: 8 %
Monocytes Absolute: 0.5 10*3/uL (ref 0.2–0.9)
NEUTROS PCT: 57 %
Neutro Abs: 3.3 10*3/uL (ref 1.4–6.5)
PLATELETS: 196 10*3/uL (ref 150–440)
RBC: 3.54 MIL/uL — ABNORMAL LOW (ref 3.80–5.20)
RDW: 13.4 % (ref 11.5–14.5)
WBC: 5.8 10*3/uL (ref 3.6–11.0)

## 2017-04-24 LAB — COMPREHENSIVE METABOLIC PANEL
ALBUMIN: 3.3 g/dL — AB (ref 3.5–5.0)
ALK PHOS: 49 U/L (ref 38–126)
ALT: 10 U/L — AB (ref 14–54)
AST: 16 U/L (ref 15–41)
Anion gap: 7 (ref 5–15)
BILIRUBIN TOTAL: 0.5 mg/dL (ref 0.3–1.2)
BUN: 27 mg/dL — AB (ref 6–20)
CO2: 31 mmol/L (ref 22–32)
CREATININE: 0.97 mg/dL (ref 0.44–1.00)
Calcium: 9.2 mg/dL (ref 8.9–10.3)
Chloride: 96 mmol/L — ABNORMAL LOW (ref 101–111)
GFR calc Af Amer: >60 mL/min (ref >60)
GFR calc non Af Amer: 55 mL/min — ABNORMAL LOW (ref >60)
GLUCOSE: 96 mg/dL (ref 65–99)
Potassium: 4 mmol/L (ref 3.5–5.1)
Sodium: 134 mmol/L — ABNORMAL LOW (ref 135–145)
TOTAL PROTEIN: 5.8 g/dL — AB (ref 6.5–8.1)

## 2017-04-24 LAB — MAGNESIUM: Magnesium: 1.8 mg/dL (ref 1.7–2.4)

## 2017-04-24 LAB — LIPID PANEL
CHOLESTEROL: 177 mg/dL (ref 0–200)
HDL: 39 mg/dL — ABNORMAL LOW (ref >40)
LDL Cholesterol: 121 mg/dL — ABNORMAL HIGH (ref 0–99)
TRIGLYCERIDES: 86 mg/dL (ref <150)
Total CHOL/HDL Ratio: 4.5 RATIO
VLDL: 17 mg/dL (ref 0–40)

## 2017-04-24 LAB — TSH: TSH: 7.731 u[IU]/mL — ABNORMAL HIGH (ref 0.350–4.500)

## 2017-04-24 LAB — VITAMIN B12: Vitamin B-12: 329 pg/mL (ref 180–914)

## 2017-04-25 ENCOUNTER — Other Ambulatory Visit
Admission: RE | Admit: 2017-04-25 | Discharge: 2017-04-25 | Disposition: A | Discharge status: Home / Self Care | Payer: PPO | Source: Ambulatory Visit | Attending: Gerontology | Admitting: Gerontology

## 2017-04-25 DIAGNOSIS — I10 Essential (primary) hypertension: Secondary | ICD-10-CM | POA: Insufficient documentation

## 2017-04-25 LAB — VITAMIN D 25 HYDROXY (VIT D DEFICIENCY, FRACTURES): Vit D, 25-Hydroxy: 41 ng/mL (ref 30.0–100.0)

## 2017-04-29 LAB — VALPROIC ACID LEVEL, FREE: VALPROIC ACID FREE: 7.3 ug/mL (ref 6.0–22.0)

## 2017-05-07 ENCOUNTER — Encounter
Admission: RE | Admit: 2017-05-07 | Discharge: 2017-05-07 | Disposition: A | Discharge status: Home / Self Care | Payer: PPO | Source: Ambulatory Visit | Attending: Internal Medicine | Admitting: Internal Medicine

## 2017-05-14 ENCOUNTER — Non-Acute Institutional Stay (SKILLED_NURSING_FACILITY): Payer: PPO | Admitting: Gerontology

## 2017-05-14 ENCOUNTER — Encounter: Payer: Self-pay | Admitting: Gerontology

## 2017-05-14 DIAGNOSIS — E222 Syndrome of inappropriate secretion of antidiuretic hormone: Secondary | ICD-10-CM | POA: Diagnosis not present

## 2017-05-14 DIAGNOSIS — G629 Polyneuropathy, unspecified: Secondary | ICD-10-CM

## 2017-05-14 DIAGNOSIS — J309 Allergic rhinitis, unspecified: Secondary | ICD-10-CM

## 2017-05-16 NOTE — Progress Notes (Signed)
Location:    Nursing Home Room Number: 245Y Place of Service:  SNF (31) Provider:  Toni Arthurs, NP-C  Kirk Ruths, MD  Patient Care Team: Kirk Ruths, MD as PCP - General (Internal Medicine) Toni Arthurs, NP as Nurse Practitioner (Family Medicine)  Extended Emergency Contact Information Primary Emergency Contact: Norm Salt Address: [place]          [name] Phone: [phone] Mobile Phone: [phone] Relation: Spouse Secondary Emergency Contact: Barry Dienes, [place] [address] [place] of [place] Phone: [phone] Mobile Phone: [phone] Relation: Daughter  Code Status:  DNR Goals of care: Advanced Directive information Advanced Directives 05/14/2017  Does Patient Have a Medical Advance Directive? No  Type of Advance Directive -  Does patient want to make changes to medical advance directive? -  Copy of [place] in Chart? -  Would patient like information on creating a medical advance directive? No - Patient declined     Chief Complaint  Patient presents with  . Medical Management of Chronic Issues    Routine Visit    HPI:  [name] seen today for medical management of chronic diseases.    Allergic rhinitis Increase in sinus-like headaches and congestion in the mornings as of late. High pollen/grass count lately. On Zyrtec 5 mg po Q HS, Singulair 10 mg po Q HS, Atrovent 0.03% nasal spray Q HS and Mucinex 1200 mg po Q 12 hours for allergies/congestion. Intermittent cough with yellow sputum. Afebrile. Denies chest pain or shortness of breath.  SIADH (syndrome of inappropriate ADH production) (HCC) Stable. Symptoms managed with Demecocycline 300 mg po Q 12 hours and Sodium Chloride tablet- 1 gram Q Day. Recent Na+ level 134.   Neuropathy Stable. Pt denies worsening pain/ burning in the legs. Symptoms controlled with Neurontin 400 mg  po TID and Effexor XL 150 mg po Q Day    Past Medical History:  Diagnosis Date  . Anxiety   . Arthritis   . Asthma   . 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 Depression   . Depression with anxiety   . GERD (gastroesophageal reflux disease)   . Hemiparesis affecting left side as late effect of cerebrovascular accident (CVA) ([place])   . History of CVA (cerebrovascular accident)   . Hot flashes   . Hyperlipidemia   . Hyperlipidemia, unspecified   . Hypertension   . Muscle hypertonicity   . Osteoarthritis   . Stroke ([place])   . TIA (transient ischemic attack)    Past Surgical History:  Procedure Laterality Date  . ACHILLES TENDON LENGTHENING  08/25/2015   Procedure: ACHILLES TENDON LENGTHENING;  Surgeon: Samara Deist, DPM;  Location: [facility] ORS;  Service: Podiatry;;  . APPENDECTOMY    . COLONOSCOPY  06/17/2008  . EYE SURGERY Bilateral    Cataract Extraction with IOL  . FLAT FOOT RECONSTRUCTION-TAL GASTROC RECESSION Left 08/25/2015   Procedure: ENDOSCOPIC FLAT FOOT RECONSTRUCTION-TAL GASTROC RECESSION;  Surgeon: Samara Deist, DPM;  Location: [facility] OR[facility];  Service: Podiatry;  Laterality: Left;  Marland Kitchen VAGINAL HYSTERECTOMY      Allergies  Allergen Reactions  . Tizanidine Other (See Comments)    HYPOTENSION AND UNRESPONSIVENESS  . Ace Inhibitors Cough  . Latex Hives  . Lisinopril Cough  . Tetracycline Other (See Comments)     REACTION: yeast  . Zanaflex [Tizanidine Hcl] Other (See  Comments)    "drowsy"    Allergies as of 05/14/2017      Reactions   Tizanidine Other (See Comments)   HYPOTENSION AND UNRESPONSIVENESS   Ace Inhibitors Cough   Latex Hives   Lisinopril Cough   Tetracycline Other (See Comments)   REACTION: yeast   Zanaflex [tizanidine Hcl] Other (See Comments)   "drowsy"      Medication List        Accurate as of 05/14/17 11:59 PM. Always use your most recent med list.          acetaminophen 500 MG tablet Commonly known as:  TYLENOL Take 1,000 mg by  mouth every 8 (eight) hours as needed for mild pain. Maximum of 3000 mg of acetaminophen in 24 hours , consider all sources  to both as needed, plain  Apap, and as needed oxydocone apap.   alum & mag hydroxide-simeth 740-814-48 MG/5ML suspension Commonly known as:  MAALOX PLUS Take 30 mLs by mouth every 4 (four) hours as needed for indigestion.   BIOTENE MOISTURIZING MOUTH MT Use as directed 2 sprays in the mouth or throat 4 (four) times daily as needed.   BREO ELLIPTA 100-25 MCG/INH Aepb Generic drug:  fluticasone furoate-vilanterol Inhale 1 puff into the lungs daily.   CALCIUM 600+D 600-400 MG-UNIT tablet Generic drug:  Calcium Carbonate-Vitamin D Take 1 tablet by mouth 2 (two) times daily.   carvedilol 3.125 MG tablet Commonly known as:  COREG Take 3.125 mg by mouth 2 (two) times daily.   cetirizine 5 MG tablet Commonly known as:  ZYRTEC Take 5 mg by mouth at bedtime.   cyanocobalamin 1000 MCG tablet Take 1,000 mcg by mouth daily.   demeclocycline 300 MG tablet Commonly known as:  DECLOMYCIN Take 300 mg by mouth 2 (two) times daily.   diclofenac sodium 1 % Gel Commonly known as:  VOLTAREN Apply 4 g topically 2 (two) times daily between meals. Apply to both knees for pain   dipyridamole-aspirin 200-25 MG 12hr capsule Commonly known as:  AGGRENOX Take 1 capsule by mouth every 12 (twelve) hours.   divalproex 125 MG capsule Commonly known as:  DEPAKOTE SPRINKLE Take 250 mg by mouth 2 (two) times daily. 2 caps   ENDIT EX Apply liberal amount topically to areas of skin irritation as needed. Ok to leave at bedside.   gabapentin 400 MG capsule Commonly known as:  NEURONTIN Take 400 mg by mouth 3 (three) times daily.   guaiFENesin 600 MG 12 hr tablet Commonly known as:  MUCINEX Take 1,200 mg by mouth every 12 (twelve) hours.   ipratropium 0.03 % nasal spray Commonly known as:  ATROVENT Place 2 sprays into both nostrils at bedtime.   ipratropium-albuterol 0.5-2.5  (3) MG/3ML Soln Commonly known as:  DUONEB Take 3 mLs by nebulization 3 (three) times daily as needed. Also take 3 times a day   levothyroxine 25 MCG tablet Commonly known as:  SYNTHROID, LEVOTHROID Take 25 mcg by mouth daily before breakfast.   methocarbamol 500 MG tablet Commonly known as:  ROBAXIN Take 500 mg by mouth every 6 (six) hours as needed for muscle spasms.   montelukast 10 MG tablet Commonly known as:  SINGULAIR Take 10 mg by mouth at bedtime.   omeprazole 20 MG capsule Commonly known as:  PRILOSEC Take 20 mg by mouth 2 (two) times daily.   oxybutynin 5 MG tablet Commonly known as:  DITROPAN Take 5 mg by mouth 2 (two) times daily.   prazosin  1 MG capsule Commonly known as:  MINIPRESS Take 3 mg by mouth at bedtime. 3 capsules   Salicylic Acid 40 % Pads Apply 1 corn pad to Right 5th toe daily until corn resolved   sennosides-docusate sodium 8.6-50 MG tablet Commonly known as:  SENOKOT-S Take 1 tablet by mouth 2 (two) times daily as needed for constipation.   sennosides-docusate sodium 8.6-50 MG tablet Commonly known as:  SENOKOT-S Take 1 tablet by mouth every 3 (three) days.   sodium chloride 1 g tablet Take 1 g by mouth daily.   spironolactone 50 MG tablet Commonly known as:  ALDACTONE Take 50 mg by mouth daily.   SYSTANE PRESERVATIVE FREE 0.4-0.3 % Soln Generic drug:  Polyethyl Glyc-Propyl Glyc PF Place 2 drops into both eyes 2 (two) times daily.   torsemide 20 MG tablet Commonly known as:  DEMADEX Take 20 mg by mouth daily.   venlafaxine XR 150 MG 24 hr capsule Commonly known as:  EFFEXOR-XR Take 150 mg by mouth daily with breakfast.   VICKS VAPORUB 4.7-1.2-2.6 % Oint Apply liberal amount to the left 3rd, 4th, 5th fingernails at bedtime for onychiomycosis       Review of Systems  Constitutional: Negative for activity change, appetite change, chills, diaphoresis and fever.  HENT: Positive for congestion, postnasal drip and sinus pressure.  Negative for mouth sores, nosebleeds, sneezing, sore throat, trouble swallowing and voice change.   Eyes: Negative for pain, discharge, redness and itching.  Respiratory: Positive for cough. Negative for apnea, choking, chest tightness, shortness of breath and wheezing.   Cardiovascular: Negative for chest pain, palpitations and leg swelling.  Gastrointestinal: Negative for abdominal distention, abdominal pain, constipation, diarrhea and nausea.  Genitourinary: Negative for difficulty urinating, dysuria, frequency and urgency.  Musculoskeletal: Positive for arthralgias (typical arthritis) and gait problem. Negative for back pain and myalgias.  Skin: Negative for color change, pallor, rash and wound.  Neurological: Positive for weakness and headaches. Negative for dizziness, tremors, syncope, speech difficulty and numbness.  Psychiatric/Behavioral: Negative for agitation and behavioral problems.  All other systems reviewed and are negative.   Immunization History  Administered Date(s) Administered  . Influenza Split 09/30/2014  . Influenza Whole 01/08/2004, 10/08/2006, 11/22/2008  . Influenza-Unspecified 08/13/2013, 09/21/2015, 09/25/2016  . Pneumococcal Conjugate-13 08/13/2013  . Pneumococcal Polysaccharide-23 10/24/2000, 11/30/2014  . Td 01/07/2001  . Zoster 01/07/2006   Pertinent  Health Maintenance Due  Topic Date Due  . INFLUENZA VACCINE  08/07/2017  . DEXA SCAN  Completed  . PNA vac Low Risk Adult  Completed   No flowsheet data found. Functional Status Survey:    Vitals:   05/14/17 0923  BP: 130/68  Pulse: 80  Resp: 20  Temp: 98.6 F (37 C)  TempSrc: Oral  SpO2: 99%  Weight: 189 lb 4.8 oz (85.9 kg)  Height: 5\' 1"  (1.549 m)   Body mass index is 35.77 kg/m. Physical Exam  Constitutional: She is oriented to person, place, and time. Vital signs are normal. She appears well-developed and well-nourished. She is active and cooperative. She does not appear ill. No  distress.  HENT:  Head: Normocephalic and atraumatic.  Mouth/Throat: Uvula is midline, oropharynx is clear and moist and mucous membranes are normal. Mucous membranes are not pale, not dry and not cyanotic.  Eyes: Pupils are equal, round, and reactive to light. Conjunctivae, EOM and lids are normal.  Neck: Trachea normal, normal range of motion and full passive range of motion without pain. Neck supple. No JVD present. No  tracheal deviation, no edema and no erythema present. No thyromegaly present.  Cardiovascular: Normal rate, regular rhythm, normal heart sounds, intact distal pulses and normal pulses. Exam reveals no gallop, no distant heart sounds and no friction rub.  No murmur heard. Pulses:      Dorsalis pedis pulses are 2+ on the right side, and 2+ on the left side.  Mild BLE edema  Pulmonary/Chest: Effort normal. No accessory muscle usage. No respiratory distress. She has no decreased breath sounds. She has no wheezes. She has no rhonchi. She has rales (very faint) in the right lower field and the left lower field. She exhibits no tenderness.  Abdominal: Soft. Normal appearance and bowel sounds are normal. She exhibits no distension and no ascites. There is no tenderness.  Musculoskeletal: Normal range of motion. She exhibits no edema or tenderness.  Expected osteoarthritis, stiffness; Bilateral Calves soft, supple. Negative Homan's Sign. B- pedal pulses equal; Left Hemiparesis, mobile in wheelchair  Neurological: She is alert and oriented to person, place, and time. She has normal strength. She exhibits abnormal muscle tone. Coordination and gait abnormal.  Left Hemiparesis r/t old CVA  Skin: Skin is warm, dry and intact. She is not diaphoretic. No cyanosis. No pallor. Nails show no clubbing.  Psychiatric: Her speech is normal. Judgment and thought content normal. She is withdrawn. Cognition and memory are impaired. She exhibits a depressed mood.  Nursing note and vitals  reviewed.   Labs reviewed: Recent Labs    09/03/16 0538  01/09/17 0412 02/13/17 1350 04/24/17 0525  NA 128*   < > 133* 133* 134*  K 3.4*   < > 5.0 3.4* 4.0  CL 87*   < > 95* 95* 96*  CO2 32   < > 32 28 31  GLUCOSE 94   < > 95 153* 96  BUN 17   < > 26* 22* 27*  CREATININE 0.92   < > 0.78 0.93 0.97  CALCIUM 9.5   < > 9.1 9.3 9.2  MG 1.8  --   --   --  1.8   < > = values in this interval not displayed.   Recent Labs    05/30/16 0620 09/03/16 0538 04/24/17 0525  AST 20 16 16   ALT 13* 10* 10*  ALKPHOS 57 47 49  BILITOT 0.6 0.4 0.5  PROT 6.2* 6.1* 5.8*  ALBUMIN 3.5 3.4* 3.3*   Recent Labs    05/30/16 0620 09/03/16 0538 04/24/17 0525  WBC 5.8 6.1 5.8  NEUTROABS 3.3 3.3 3.3  HGB 11.8* 11.7* 11.4*  HCT 34.3* 33.4* 33.7*  MCV 90.0 90.7 95.2  PLT 214 208 196   Lab Results  Component Value Date   TSH 7.731 (H) 04/24/2017   Lab Results  Component Value Date   HGBA1C 5.6 06/23/2009   Lab Results  Component Value Date   CHOL 177 04/24/2017   HDL 39 (L) 04/24/2017   LDLCALC 121 (H) 04/24/2017   LDLDIRECT 161.4 12/14/2008   TRIG 86 04/24/2017   CHOLHDL 4.5 04/24/2017    Significant Diagnostic Results in last 30 days:  No results found.  Assessment/Plan Tristian was seen today for medical management of chronic issues.  Diagnoses and all orders for this visit:  Allergic rhinitis, unspecified seasonality, unspecified trigger  SIADH (syndrome of inappropriate ADH production) (HCC)  Neuropathy   Above conditions stable, except allergies  Continue current medication regimen, except  Add Nasacort 55 mcg- 1 spray each nostril Q Day  Encourage po fluid  intake  Monitor for fever or worsening cough  Utilize prn Duonebs for wheezing, SOB  Continue to encourage pt participation in activities and interaction with other residents  Continue restorative nursing program  Family/ staff Communication:   Total Time:  Documentation:  Face to  Face:  Family/Phone:   Labs/tests ordered:  Recent labs reviewed- stable  Medication list reviewed and assessed for continued appropriateness. Monthly medication orders reviewed and signed.  Vikki Ports, NP-C Geriatrics [facility] [phone], [place] [address] Cell Phone (Mon-Fri 8am-5pm):  [phone] On Call:  [phone] & follow prompts after 5pm & weekends Office Phone:  [phone] Office Fax:  [phone]

## 2017-05-16 NOTE — Assessment & Plan Note (Signed)
Stable. Symptoms managed with Demecocycline 300 mg po Q 12 hours and Sodium Chloride tablet- 1 gram Q Day. Recent Na+ level 134.

## 2017-05-16 NOTE — Assessment & Plan Note (Signed)
Increase in sinus-like headaches and congestion in the mornings as of late. High pollen/grass count lately. On Zyrtec 5 mg po Q HS, Singulair 10 mg po Q HS, Atrovent 0.03% nasal spray Q HS and Mucinex 1200 mg po Q 12 hours for allergies/congestion. Intermittent cough with yellow sputum. Afebrile. Denies chest pain or shortness of breath.

## 2017-05-16 NOTE — Assessment & Plan Note (Signed)
Stable. Pt denies worsening pain/ burning in the legs. Symptoms controlled with Neurontin 400 mg po TID and Effexor XL 150 mg po Q Day

## 2017-06-05 ENCOUNTER — Other Ambulatory Visit
Admission: RE | Admit: 2017-06-05 | Discharge: 2017-06-05 | Disposition: A | Discharge status: Home / Self Care | Payer: PPO | Source: Ambulatory Visit | Attending: Gerontology | Admitting: Gerontology

## 2017-06-05 DIAGNOSIS — F419 Anxiety disorder, unspecified: Secondary | ICD-10-CM | POA: Diagnosis not present

## 2017-06-05 LAB — TSH: TSH: 5.209 u[IU]/mL — AB (ref 0.350–4.500)

## 2017-06-06 LAB — VITAMIN D 25 HYDROXY (VIT D DEFICIENCY, FRACTURES): Vit D, 25-Hydroxy: 41 ng/mL (ref 30.0–100.0)

## 2017-06-06 LAB — VALPROIC ACID LEVEL, FREE: Valproic Acid, Free: 5.9 ug/mL — ABNORMAL LOW (ref 6.0–22.0)

## 2017-06-07 ENCOUNTER — Encounter
Admission: RE | Admit: 2017-06-07 | Discharge: 2017-06-07 | Disposition: A | Discharge status: Home / Self Care | Payer: PPO | Source: Ambulatory Visit | Attending: Internal Medicine | Admitting: Internal Medicine

## 2017-06-18 ENCOUNTER — Non-Acute Institutional Stay (SKILLED_NURSING_FACILITY): Payer: PPO | Admitting: Gerontology

## 2017-06-18 ENCOUNTER — Encounter: Payer: Self-pay | Admitting: Gerontology

## 2017-06-18 DIAGNOSIS — M21272 Flexion deformity, left ankle and toes: Secondary | ICD-10-CM | POA: Diagnosis not present

## 2017-06-18 DIAGNOSIS — M24542 Contracture, left hand: Secondary | ICD-10-CM

## 2017-06-18 DIAGNOSIS — B351 Tinea unguium: Secondary | ICD-10-CM | POA: Diagnosis not present

## 2017-07-04 NOTE — Assessment & Plan Note (Signed)
Stable, persistent. Uses splint. No c/o pain

## 2017-07-04 NOTE — Assessment & Plan Note (Signed)
Stable. No recent c/o pain. Minimally ambulatory. Uses wheelchair for most mobility

## 2017-07-04 NOTE — Assessment & Plan Note (Signed)
Stable. No further s/s of fungus with use of Vick's Vaporub. No more c/o

## 2017-07-04 NOTE — Progress Notes (Signed)
Location:    [facility]ursing Home Room Number: 149F Place of Service:  SNF (31) Provider:  Toni Arthurs, NP-C  Margaret Ruths, MD  Patient Care Team: Margaret Ruths, MD as PCP - General (Internal Medicine) Margaret Arthurs, NP as Nurse Practitioner (Family Medicine)  Extended Emergency Contact Information Primary Emergency Contact: [name] Address: [place]          [name] Phone: [phone] Mobile Phone: [phone] Relation: Spouse Secondary Emergency Contact: [name], [place] [address] [name] of [place] Phone: [phone] Mobile Phone: [phone] Relation: Daughter  Code Status:  FULL Goals of care: Advanced Directive information Advanced Directives 06/18/2017  Does Patient Have a Medical Advance Directive? No  Type of Advance Directive -  Does patient want to make changes to medical advance directive? -  Copy of [place] in Chart? -  Would patient like information on creating a medical advance directive? No - Patient declined     Chief Complaint  Patient presents with  . Medical Management of Chronic Issues    Routine Visit    HPI:  [name] seen today for medical management of chronic diseases.    Contracture, left hand Stable, persistent. Uses splint. No c/o pain  Flexion deformity, left ankle and toes Stable. No recent c/o pain. Minimally ambulatory. Uses wheelchair for most mobility  Onychomycosis Stable. No further s/s of fungus with use of Vick's Vaporub. No more c/o    Past Medical History:  Diagnosis Date  . Anxiety   . Arthritis   . Asthma   . 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 Depression   . Depression with anxiety   . GERD (gastroesophageal reflux disease)   . Hemiparesis affecting left side as late effect of cerebrovascular accident (CVA) ([place])   . History of CVA (cerebrovascular accident)   . Hot  flashes   . Hyperlipidemia   . Hyperlipidemia, unspecified   . Hypertension   . Muscle hypertonicity   . Osteoarthritis   . Stroke ([place])   . TIA (transient ischemic attack)    Past Surgical History:  Procedure Laterality Date  . ACHILLES TENDON LENGTHENING  08/25/2015   Procedure: ACHILLES TENDON LENGTHENING;  Surgeon: Samara Deist, DPM;  Location: [facility] ORS;  Service: Podiatry;;  . APPENDECTOMY    . COLONOSCOPY  06/17/2008  . EYE SURGERY Bilateral    Cataract Extraction with IOL  . FLAT FOOT RECONSTRUCTION-TAL GASTROC RECESSION Left 08/25/2015   Procedure: ENDOSCOPIC FLAT FOOT RECONSTRUCTION-TAL GASTROC RECESSION;  Surgeon: Samara Deist, DPM;  Location: [facility] ORS;  Service: Podiatry;  Laterality: Left;  Marland Kitchen VAGINAL HYSTERECTOMY      Allergies  Allergen Reactions  . Tizanidine Other (See Comments)    HYPOTENSION AND UNRESPONSIVENESS  . Ace Inhibitors Cough  . Latex Hives  . Lisinopril Cough  . Tetracycline Other (See Comments)     REACTION: yeast  . Zanaflex [Tizanidine Hcl] Other (See Comments)    "drowsy"    Allergies as of 06/18/2017      Reactions   Tizanidine Other (See Comments)   HYPOTENSION AND UNRESPONSIVENESS   Ace Inhibitors Cough   Latex Hives   Lisinopril Cough   Tetracycline Other (See Comments)   REACTION: yeast   Zanaflex [tizanidine Hcl] Other (See Comments)   "drowsy"      Medication List  Accurate as of 06/18/17 11:59 PM. Always use your most recent med list.          acetaminophen 500 MG tablet Commonly known as:  TYLENOL Take 1,000 mg by mouth every 8 (eight) hours as needed for mild pain. Maximum of 3000 mg of acetaminophen in 24 hours , consider all sources  to both as needed, plain  Apap, and as needed oxydocone apap.   acetylcysteine 10 % nebulizer solution Commonly known as:  MUCOMYST Take 10 mLs by nebulization daily. Give after AM Duoneb for assistance with mucus expectoration   alum & mag hydroxide-simeth 035-009-38 MG/5ML  suspension Commonly known as:  MAALOX PLUS Take 30 mLs by mouth every 4 (four) hours as needed for indigestion.   BIOTENE MOISTURIZING MOUTH MT Use as directed 2 sprays in the mouth or throat 4 (four) times daily as needed.   BREO ELLIPTA 100-25 MCG/INH Aepb Generic drug:  fluticasone furoate-vilanterol Inhale 1 puff into the lungs daily.   CALCIUM 600+D 600-400 MG-UNIT tablet Generic drug:  Calcium Carbonate-Vitamin D Take 1 tablet by mouth 2 (two) times daily.   carvedilol 3.125 MG tablet Commonly known as:  COREG Take 3.125 mg by mouth 2 (two) times daily.   cetirizine 5 MG tablet Commonly known as:  ZYRTEC Take 5 mg by mouth at bedtime.   cyanocobalamin 1000 MCG tablet Take 1,000 mcg by mouth daily.   DELSYM 30 MG/5ML liquid Generic drug:  dextromethorphan Take 10 mLs by mouth every 12 (twelve) hours as needed for cough.   demeclocycline 300 MG tablet Commonly known as:  DECLOMYCIN Take 300 mg by mouth 2 (two) times daily.   diclofenac sodium 1 % Gel Commonly known as:  VOLTAREN Apply 4 g topically 2 (two) times daily between meals. Apply to both knees for pain   dipyridamole-aspirin 200-25 MG 12hr capsule Commonly known as:  AGGRENOX Take 1 capsule by mouth every 12 (twelve) hours.   divalproex 125 MG capsule Commonly known as:  DEPAKOTE SPRINKLE Take 250 mg by mouth 2 (two) times daily. 2 caps   ENDIT EX Apply liberal amount topically to areas of skin irritation as needed. Ok to leave at bedside.   gabapentin 400 MG capsule Commonly known as:  NEURONTIN Take 400 mg by mouth 3 (three) times daily.   guaiFENesin 600 MG 12 hr tablet Commonly known as:  MUCINEX Take 1,200 mg by mouth every 12 (twelve) hours.   ipratropium 0.03 % nasal spray Commonly known as:  ATROVENT Place 2 sprays into both nostrils at bedtime.   ipratropium-albuterol 0.5-2.5 (3) MG/3ML Soln Commonly known as:  DUONEB Take 3 mLs by nebulization 3 (three) times daily as needed. Also  take 3 times a day   levothyroxine 25 MCG tablet Commonly known as:  SYNTHROID, LEVOTHROID Take 25 mcg by mouth daily before breakfast.   methocarbamol 500 MG tablet Commonly known as:  ROBAXIN Take 500 mg by mouth every 6 (six) hours as needed for muscle spasms.   montelukast 10 MG tablet Commonly known as:  SINGULAIR Take 10 mg by mouth at bedtime.   NASACORT ALLERGY 24HR 55 MCG/ACT Aero nasal inhaler Generic drug:  triamcinolone Place 1 spray into the nose daily.   omeprazole 20 MG capsule Commonly known as:  PRILOSEC Take 20 mg by mouth 2 (two) times daily.   oxybutynin 5 MG tablet Commonly known as:  DITROPAN Take 5 mg by mouth 2 (two) times daily.   prazosin 1 MG capsule Commonly known as:  MINIPRESS Take 3 mg by mouth at bedtime. 3 capsules   Salicylic Acid 40 % Pads Apply 1 corn pad to Right 5th toe daily until corn resolved   sennosides-docusate sodium 8.6-50 MG tablet Commonly known as:  SENOKOT-S Take 1 tablet by mouth 2 (two) times daily as needed for constipation.   sennosides-docusate sodium 8.6-50 MG tablet Commonly known as:  SENOKOT-S Take 1 tablet by mouth every 3 (three) days.   sodium chloride 1 g tablet Take 1 g by mouth daily.   spironolactone 50 MG tablet Commonly known as:  ALDACTONE Take 50 mg by mouth daily.   SYSTANE PRESERVATIVE FREE 0.4-0.3 % Soln Generic drug:  Polyethyl Glyc-Propyl Glyc PF Place 2 drops into both eyes 2 (two) times daily.   torsemide 20 MG tablet Commonly known as:  DEMADEX Take 20 mg by mouth daily.   venlafaxine XR 150 MG 24 hr capsule Commonly known as:  EFFEXOR-XR Take 150 mg by mouth daily with breakfast.       Review of Systems  Constitutional: Negative for activity change, appetite change, chills, diaphoresis and fever.  HENT: Negative for congestion, mouth sores, nosebleeds, postnasal drip, sneezing, sore throat, trouble swallowing and voice change.   Respiratory: Positive for cough. Negative for  apnea, choking, chest tightness, shortness of breath (chronic, non-productive) and wheezing.   Cardiovascular: Negative for chest pain, palpitations and leg swelling.  Gastrointestinal: Negative for abdominal distention, abdominal pain, constipation, diarrhea and nausea.  Genitourinary: Negative for difficulty urinating, dysuria, frequency and urgency.  Musculoskeletal: Positive for arthralgias (typical arthritis) and gait problem. Negative for back pain and myalgias.  Skin: Negative for color change, pallor, rash and wound.  Neurological: Positive for weakness. Negative for dizziness, tremors, syncope, speech difficulty, numbness and headaches.  Psychiatric/Behavioral: Negative for agitation and behavioral problems.  All other systems reviewed and are negative.   Immunization History  Administered Date(s) Administered  . Influenza Split 09/30/2014  . Influenza Whole 01/08/2004, 10/08/2006, 11/22/2008  . Influenza-Unspecified 08/13/2013, 09/21/2015, 09/25/2016  . Pneumococcal Conjugate-13 08/13/2013  . Pneumococcal Polysaccharide-23 10/24/2000, 11/30/2014  . Td 01/07/2001  . Zoster 01/07/2006   Pertinent  Health Maintenance Due  Topic Date Due  . INFLUENZA VACCINE  08/07/2017  . DEXA SCAN  Completed  . PNA vac Low Risk Adult  Completed   No flowsheet data found. Functional Status Survey:    Vitals:   06/18/17 1033  BP: 113/60  Pulse: 82  Resp: 16  Temp: 98.6 F (37 C)  TempSrc: Oral  SpO2: 99%  Weight: 185 lb 9.6 oz (84.2 kg)  Height: 5\' 1"  (1.549 m)   Body mass index is 35.07 kg/m. Physical Exam  Constitutional: She is oriented to person, place, and time. Vital signs are normal. She appears well-developed and well-nourished. She is active and cooperative. She does not appear ill. No distress.  HENT:  Head: Normocephalic and atraumatic.  Mouth/Throat: Uvula is midline, oropharynx is clear and moist and mucous membranes are normal. Mucous membranes are not pale, not  dry and not cyanotic.  Eyes: Pupils are equal, round, and reactive to light. Conjunctivae, EOM and lids are normal.  Neck: Trachea normal, normal range of motion and full passive range of motion without pain. Neck supple. No JVD present. No tracheal deviation, no edema and no erythema present. No thyromegaly present.  Cardiovascular: Normal rate, regular rhythm, normal heart sounds, intact distal pulses and normal pulses. Exam reveals no gallop, no distant heart sounds and no friction rub.  No murmur  heard. Pulses:      Dorsalis pedis pulses are 2+ on the right side, and 2+ on the left side.  No edema  Pulmonary/Chest: Effort normal. No accessory muscle usage. No respiratory distress. She has decreased breath sounds in the right lower field and the left lower field. She has no wheezes. She has no rhonchi. She has no rales. She exhibits no tenderness.  Abdominal: Soft. Normal appearance and bowel sounds are normal. She exhibits no distension and no ascites. There is no tenderness.  Musculoskeletal: Normal range of motion. She exhibits no edema or tenderness.  Expected osteoarthritis, stiffness; Bilateral Calves soft, supple. Negative Homan's Sign. B- pedal pulses equal; generalized weakness, wheelchair for long distance mobility, left hand contractures  Neurological: She is alert and oriented to person, place, and time. A cranial nerve deficit is present. She exhibits abnormal muscle tone. Coordination and gait abnormal.  Left hand contractures  Skin: Skin is warm, dry and intact. She is not diaphoretic. No cyanosis. No pallor. Nails show no clubbing.  Psychiatric: Her speech is normal and behavior is normal. Judgment and thought content normal. Her affect is blunt. Cognition and memory are normal.  Nursing note and vitals reviewed.   Labs reviewed: Recent Labs    09/03/16 0538  01/09/17 0412 02/13/17 1350 04/24/17 0525  NA 128*   < > 133* 133* 134*  K 3.4*   < > 5.0 3.4* 4.0  CL 87*   < >  95* 95* 96*  CO2 32   < > 32 28 31  GLUCOSE 94   < > 95 153* 96  BUN 17   < > 26* 22* 27*  CREATININE 0.92   < > 0.78 0.93 0.97  CALCIUM 9.5   < > 9.1 9.3 9.2  MG 1.8  --   --   --  1.8   < > = values in this interval not displayed.   Recent Labs    09/03/16 0538 04/24/17 0525  AST 16 16  ALT 10* 10*  ALKPHOS 47 49  BILITOT 0.4 0.5  PROT 6.1* 5.8*  ALBUMIN 3.4* 3.3*   Recent Labs    09/03/16 0538 04/24/17 0525  WBC 6.1 5.8  NEUTROABS 3.3 3.3  HGB 11.7* 11.4*  HCT 33.4* 33.7*  MCV 90.7 95.2  PLT 208 196   Lab Results  Component Value Date   TSH 5.209 (H) 06/05/2017   Lab Results  Component Value Date   HGBA1C 5.6 06/23/2009   Lab Results  Component Value Date   CHOL 177 04/24/2017   HDL 39 (L) 04/24/2017   LDLCALC 121 (H) 04/24/2017   LDLDIRECT 161.4 12/14/2008   TRIG 86 04/24/2017   CHOLHDL 4.5 04/24/2017    Significant Diagnostic Results in last 30 days:  No results found.  Assessment/Plan [name] was seen today for medical management of chronic issues.  Diagnoses and all orders for this visit:  Contracture, left hand  Flexion deformity, left ankle and toes  Onychomycosis   Above listed conditions stable  Continue current medication regimen  Continue to participate in restorative nursing program  Assist with ambulation/ ADLs as necessary  Safety precautions  Fall precautions  Splint to the hand and ROM exercises daily  Pain control as needed  Family/ staff Communication:   Total Time:  Documentation:  Face to Face:  Family/Phone:   Labs/tests ordered:  Not due- recent labs reviewed- stable  Medication list reviewed and assessed for continued appropriateness. Monthly medication orders reviewed and  signed.  Vikki Ports, NP-C Geriatrics [facility] [phone], [place] [address] Cell Phone (Mon-Fri 8am-5pm):  [phone] On Call:  [phone] & follow prompts after 5pm &  weekends Office Phone:  [phone] Office Fax:  [phone]

## 2017-07-07 ENCOUNTER — Encounter
Admission: RE | Admit: 2017-07-07 | Discharge: 2017-07-07 | Disposition: A | Discharge status: Home / Self Care | Payer: PPO | Source: Ambulatory Visit | Attending: Internal Medicine | Admitting: Internal Medicine

## 2017-07-11 ENCOUNTER — Encounter: Payer: Self-pay | Admitting: Adult Health

## 2017-07-11 ENCOUNTER — Non-Acute Institutional Stay (SKILLED_NURSING_FACILITY): Payer: PPO | Admitting: Adult Health

## 2017-07-11 DIAGNOSIS — M25562 Pain in left knee: Secondary | ICD-10-CM | POA: Diagnosis not present

## 2017-07-11 DIAGNOSIS — E034 Atrophy of thyroid (acquired): Secondary | ICD-10-CM | POA: Diagnosis not present

## 2017-07-11 DIAGNOSIS — I6350 Cerebral infarction due to unspecified occlusion or stenosis of unspecified cerebral artery: Secondary | ICD-10-CM

## 2017-07-11 DIAGNOSIS — J309 Allergic rhinitis, unspecified: Secondary | ICD-10-CM | POA: Diagnosis not present

## 2017-07-11 DIAGNOSIS — F329 Major depressive disorder, single episode, unspecified: Secondary | ICD-10-CM | POA: Diagnosis not present

## 2017-07-11 DIAGNOSIS — K219 Gastro-esophageal reflux disease without esophagitis: Secondary | ICD-10-CM | POA: Diagnosis not present

## 2017-07-11 DIAGNOSIS — M25561 Pain in right knee: Secondary | ICD-10-CM | POA: Diagnosis not present

## 2017-07-11 DIAGNOSIS — G8929 Other chronic pain: Secondary | ICD-10-CM | POA: Diagnosis not present

## 2017-07-11 DIAGNOSIS — R600 Localized edema: Secondary | ICD-10-CM

## 2017-07-11 DIAGNOSIS — I10 Essential (primary) hypertension: Secondary | ICD-10-CM

## 2017-07-11 DIAGNOSIS — E222 Syndrome of inappropriate secretion of antidiuretic hormone: Secondary | ICD-10-CM

## 2017-07-11 NOTE — Progress Notes (Signed)
Location:   [facility] Room Number: 302A Place of Service:  SNF (31)   CODE STATUS: FULL  Allergies  Allergen Reactions  . Tizanidine Other (See Comments)    HYPOTENSION AND UNRESPONSIVENESS  . Ace Inhibitors Cough  . Latex Hives  . Lisinopril Cough  . Tetracycline Other (See Comments)     REACTION: yeast  . Zanaflex [Tizanidine Hcl] Other (See Comments)    "drowsy"    Chief Complaint  Patient presents with  . Medical Management of Chronic Issues    Cva; hypertension; gerd.     HPI:  She is a 77 year old long term resident of this facility being seen for the management of her chronic illnesses: cva; hypertension; gerd. She denies any heart burn; headaches no chest pain. There are no nursing concerns at this time.   Past Medical History:  Diagnosis Date  . Anxiety   . Arthritis   . Asthma   . 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 Depression   . Depression with anxiety   . GERD (gastroesophageal reflux disease)   . Hemiparesis affecting left side as late effect of cerebrovascular accident (CVA) ([place])   . History of CVA (cerebrovascular accident)   . Hot flashes   . Hyperlipidemia   . Hyperlipidemia, unspecified   . Hypertension   . Muscle hypertonicity   . Osteoarthritis   . Stroke ([place])   . TIA (transient ischemic attack)     Past Surgical History:  Procedure Laterality Date  . ACHILLES TENDON LENGTHENING  08/25/2015   Procedure: ACHILLES TENDON LENGTHENING;  Surgeon: Samara Deist, DPM;  Location: [facility] ORS;  Service: Podiatry;;  . APPENDECTOMY    . COLONOSCOPY  06/17/2008  . EYE SURGERY Bilateral    Cataract Extraction with IOL  . FLAT FOOT RECONSTRUCTION-TAL GASTROC RECESSION Left 08/25/2015   Procedure: ENDOSCOPIC FLAT FOOT RECONSTRUCTION-TAL GASTROC RECESSION;  Surgeon: Samara Deist, DPM;  Location: [facility] ORS;  Service: Podiatry;  Laterality: Left;  Marland Kitchen VAGINAL HYSTERECTOMY      Social History   Socioeconomic  History  . Marital status: Married    Spouse name: Not on file  . Number of children: Not on file  . Years of education: Not on file  . Highest education level: Not on file  Occupational History  . Occupation: retired  Scientific laboratory technician  . Financial resource strain: Not on file  . Food insecurity:    Worry: Not on file    Inability: Not on file  . Transportation needs:    Medical: Not on file    Non-medical: Not on file  Tobacco Use  . Smoking status: Former Smoker    Packs/day: 0.50    Years: 3.00    Pack years: 1.50    Types: Cigarettes    Last attempt to quit: 01/08/1963    Years since quitting: 54.5  . Smokeless tobacco: Never Used  Substance and Sexual Activity  . Alcohol use: No    Alcohol/week: 0.0 oz    Comment: does not drink alcohol  . Drug use: No  . Sexual activity: Not on file  Lifestyle  . Physical activity:    Days per week: Not on file    Minutes per session: Not on file  . Stress: Not on file  Relationships  . Social connections:    Talks on phone: Not on file    Gets together: Not on file    Attends religious service: Not  on file    Active member of club or organization: Not on file    Attends meetings of clubs or organizations: Not on file    Relationship status: Not on file  . Intimate partner violence:    Fear of current or ex partner: Not on file    Emotionally abused: Not on file    Physically abused: Not on file    Forced sexual activity: Not on file  Other Topics Concern  . Not on file  Social History Narrative   Admitted to [facility] [place] 11/30/2014   Married   Former smoker   Full Code   Family History  Problem Relation Age of Onset  . Alcohol abuse Mother   . Coronary artery disease Mother   . Stroke Mother   . Liver cancer Father   . Alcohol abuse Brother   . Coronary artery disease Brother   . Diabetes type II Brother   . Liver cancer Brother       VITAL SIGNS BP 136/80   Pulse 73   Temp 98.2 F (36.8 C) (Oral)    Resp 16   Ht 5\' 1"  (1.549 m)   Wt 185 lb 9.6 oz (84.2 kg)   SpO2 98%   BMI 35.07 kg/m   Outpatient Encounter Medications as of 07/11/2017  Medication Sig  . acetaminophen (TYLENOL) 500 MG tablet Take 1,000 mg by mouth every 8 (eight) hours as needed for mild pain. Maximum of 3000 mg of acetaminophen in 24 hours , consider all sources  to both as needed, plain  Apap, and as needed oxydocone apap.  Marland Kitchen acetylcysteine (MUCOMYST) 10 % nebulizer solution Take 10 mLs by nebulization daily. Give after AM Duoneb for assistance with mucus expectoration  . alum & mag hydroxide-simeth (MAALOX PLUS) 400-400-40 MG/5ML suspension Take 30 mLs by mouth every 4 (four) hours as needed for indigestion.   . Artificial Saliva (BIOTENE MOISTURIZING MOUTH MT) Use as directed 2 sprays in the mouth or throat 4 (four) times daily as needed.  . Calcium Carbonate-Vitamin D (CALCIUM 600+D) 600-400 MG-UNIT tablet Take 1 tablet by mouth 2 (two) times daily.   . carvedilol (COREG) 3.125 MG tablet Take 3.125 mg by mouth 2 (two) times daily.   . cetirizine (ZYRTEC) 5 MG tablet Take 5 mg by mouth at bedtime.  . cyanocobalamin 1000 MCG tablet Take 1,000 mcg by mouth daily.  Marland Kitchen demeclocycline (DECLOMYCIN) 300 MG tablet Take 300 mg by mouth 2 (two) times daily.  Marland Kitchen dextromethorphan (DELSYM) 30 MG/5ML liquid Take 10 mLs by mouth every 12 (twelve) hours as needed for cough.  . diclofenac sodium (VOLTAREN) 1 % GEL Apply 4 g topically 2 (two) times daily between meals. Apply to both knees for pain  . dipyridamole-aspirin (AGGRENOX) 200-25 MG 12hr capsule Take 1 capsule by mouth every 12 (twelve) hours.   . divalproex (DEPAKOTE SPRINKLE) 125 MG capsule Take 250 mg by mouth 2 (two) times daily. 2 caps  . fluticasone furoate-vilanterol (BREO ELLIPTA) 100-25 MCG/INH AEPB Inhale 1 puff into the lungs daily.  Marland Kitchen gabapentin (NEURONTIN) 400 MG capsule Take 400 mg by mouth 3 (three) times daily.  Marland Kitchen guaiFENesin (MUCINEX) 600 MG 12 hr tablet Take  1,200 mg by mouth every 12 (twelve) hours.   Marland Kitchen ipratropium (ATROVENT) 0.03 % nasal spray Place 2 sprays into both nostrils at bedtime.   Marland Kitchen ipratropium-albuterol (DUONEB) 0.5-2.5 (3) MG/3ML SOLN Take 3 mLs by nebulization 3 (three) times daily as needed. Also take 3  times a day  . levothyroxine (SYNTHROID, LEVOTHROID) 25 MCG tablet Take 25 mcg by mouth daily before breakfast.   . methocarbamol (ROBAXIN) 500 MG tablet Take 500 mg by mouth every 6 (six) hours as needed for muscle spasms.  . montelukast (SINGULAIR) 10 MG tablet Take 10 mg by mouth at bedtime.   Marland Kitchen omeprazole (PRILOSEC) 20 MG capsule Take 20 mg by mouth 2 (two) times daily.   Marland Kitchen oxybutynin (DITROPAN) 5 MG tablet Take 5 mg by mouth 2 (two) times daily.   Vladimir Faster Glyc-Propyl Glyc PF (SYSTANE PRESERVATIVE FREE) 0.4-0.3 % SOLN Place 2 drops into both eyes 2 (two) times daily.  . prazosin (MINIPRESS) 1 MG capsule Take 3 mg by mouth at bedtime. 3 capsules  . Salicylic Acid 40 % PADS Apply 1 corn pad to Right 5th toe daily until corn resolved  . sennosides-docusate sodium (SENOKOT-S) 8.6-50 MG tablet Take 1 tablet by mouth 2 (two) times daily as needed for constipation.   . sennosides-docusate sodium (SENOKOT-S) 8.6-50 MG tablet Take 1 tablet by mouth every 3 (three) days.   . Skin Protectants, Misc. (ENDIT EX) Apply liberal amount topically to areas of skin irritation as needed. Ok to leave at bedside.  . sodium chloride 1 g tablet Take 1 g by mouth daily.  Marland Kitchen spironolactone (ALDACTONE) 50 MG tablet Take 50 mg by mouth daily.  Marland Kitchen torsemide (DEMADEX) 20 MG tablet Take 20 mg by mouth daily.  Marland Kitchen triamcinolone (NASACORT ALLERGY 24HR) 55 MCG/ACT AERO nasal inhaler Place 1 spray into the nose daily.  Marland Kitchen venlafaxine XR (EFFEXOR-XR) 150 MG 24 hr capsule Take 150 mg by mouth daily with breakfast.   No facility-administered encounter medications on file as of 07/11/2017.      SIGNIFICANT DIAGNOSTIC EXAMS   LABS REVIEWED:   04-24-17: wbc 5.8; hgb  11.4; hct 33.7; mcv 95.2; plt 196 glucose 96; bun 27; creat 0.97; k+ 4.0; na++ 134; ca 9.2; liver normal albumin 3.3 chol 177; ldl 121; trig 86 hdl 39; mag 1.8 tsh 7.731;  Vit B 12 329; vit D 41.0;  04-25-17: depakote free 7.3  06-05-17: tsh 5.209; depakote free 5.9    Review of Systems  Constitutional: Negative for malaise/fatigue.  Respiratory: Negative for cough and shortness of breath.   Cardiovascular: Negative for chest pain, palpitations and leg swelling.  Gastrointestinal: Negative for abdominal pain, constipation and heartburn.  Musculoskeletal: Negative for back pain, joint pain and myalgias.  Skin: Negative.   Neurological: Negative for dizziness.  Psychiatric/Behavioral: The patient is not nervous/anxious.     Physical Exam  Constitutional: She is oriented to person, place, and time. She appears well-developed and well-nourished. No distress.  Neck: No thyromegaly present.  Cardiovascular: Normal rate, regular rhythm, normal heart sounds and intact distal pulses.  Pulmonary/Chest: Effort normal and breath sounds normal. No respiratory distress.  Abdominal: Soft. Bowel sounds are normal. She exhibits no distension. There is no tenderness.  Musculoskeletal: She exhibits no edema.  Is able to move all extremities AFO left foot Left hand splint   Lymphadenopathy:    She has no cervical adenopathy.  Neurological: She is alert and oriented to person, place, and time.  Skin: Skin is warm and dry. She is not diaphoretic.  Under left breast small cyst present; unable to express drainage present is red; inflamed and tender   Psychiatric: She has a normal mood and affect.      ASSESSMENT/ PLAN:  TODAY:   1.  Cerebral artery occlusion with  cerebral infarction: is neurologically stable; will continue aggrenox twice daily   2. Essential hypertension, benign: is stable b/p 136/80: will continue coreg 3.125 mg twice daily minipress 3 mg nightly; aldactone 50 mg daily   3.  Allergic rhinitis: is stable will continue zyrtec 5 mg daily singulair 10 mg daily nasocort daily   4. Uncomplicated asthma: is stable will continue mucomyst neb daily mucinex 1200 mg twice atrovent nasal spray nightly breo daily; duoneb three times daily and three times daily as needed   5. Gastroesophageal reflux disease without esophagitis: stable prilosec 20 mg daily   6. Hypothyroidism due to acquired atrophy of thyroid: stable tsh 5.209 will continue synthroid 25 mcg daily   7.  Overactive bladder: is stable will continue ditropan 5 mg twice daily   8.  Major depression, single episode: stable will continue effexor xr 150 mg daily depakote 250 mg twice daily stabilize mood   9. Chronic constipation: stable will continue senna s three times daily and twice daily as needed   10. Chronic hyponatremia: is stable na++ 134; will continue nacl 1 gm daily   11. Bilateral lower extremity edema: is stable will continue demadex 20 mg daily   12. Bilateral chronic knee pain: stable will continue voltaren gel 4 gm twice daily to both knees; robaxin 500 mg every 6 hours as needed  13. SIADH: is stable will continue declomycin 300 mg twice daily   14. Left breast cyst: will begin keflex 500 mg twice daily for 7 days with probiotic         MD is aware of resident's narcotic use and is in agreement with current plan of care. We will attempt to wean resident as apropriate   Ok Edwards NP [facility]  Contact [phone] Monday through Friday 8am- 5pm  After hours call [phone]

## 2017-07-14 DIAGNOSIS — G8929 Other chronic pain: Secondary | ICD-10-CM | POA: Insufficient documentation

## 2017-07-14 DIAGNOSIS — R600 Localized edema: Secondary | ICD-10-CM | POA: Insufficient documentation

## 2017-07-14 DIAGNOSIS — M25562 Pain in left knee: Secondary | ICD-10-CM

## 2017-07-14 DIAGNOSIS — M25561 Pain in right knee: Secondary | ICD-10-CM

## 2017-08-01 ENCOUNTER — Non-Acute Institutional Stay (SKILLED_NURSING_FACILITY): Payer: PPO | Admitting: Adult Health

## 2017-08-01 ENCOUNTER — Encounter: Payer: Self-pay | Admitting: Adult Health

## 2017-08-01 DIAGNOSIS — I6350 Cerebral infarction due to unspecified occlusion or stenosis of unspecified cerebral artery: Secondary | ICD-10-CM

## 2017-08-01 DIAGNOSIS — F329 Major depressive disorder, single episode, unspecified: Secondary | ICD-10-CM | POA: Diagnosis not present

## 2017-08-01 DIAGNOSIS — I10 Essential (primary) hypertension: Secondary | ICD-10-CM | POA: Diagnosis not present

## 2017-08-01 NOTE — Progress Notes (Signed)
Location:   [facility] Room Number: 302A Place of Service:  SNF (31)   CODE STATUS: FULL  Allergies  Allergen Reactions  . Tizanidine Other (See Comments)    HYPOTENSION AND UNRESPONSIVENESS  . Ace Inhibitors Cough  . Latex Hives  . Lisinopril Cough  . Tetracycline Other (See Comments)     REACTION: yeast  . Zanaflex [Tizanidine Hcl] Other (See Comments)    "drowsy"    Chief Complaint  Patient presents with  . Acute Visit    Mood    HPI:  She is wanting to have her effexor stopped. She feels as though this medication is not providing her with any benefit as she denies any strong feelings of depression anxiety or sense of hopelessness. We have discussed that weaning off the medication would be best. She has agreed to this plan. She would also like to come depakote in the future. She continues to be followed for her chronic illnesses including: cva; hypertension and depression. There are no nursing concerns at this time.   Past Medical History:  Diagnosis Date  . Anxiety   . Arthritis   . Asthma   . 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 Depression   . Depression with anxiety   . GERD (gastroesophageal reflux disease)   . Hemiparesis affecting left side as late effect of cerebrovascular accident (CVA) ([place])   . History of CVA (cerebrovascular accident)   . Hot flashes   . Hyperlipidemia   . Hyperlipidemia, unspecified   . Hypertension   . Muscle hypertonicity   . Osteoarthritis   . Stroke ([place])   . TIA (transient ischemic attack)     Past Surgical History:  Procedure Laterality Date  . ACHILLES TENDON LENGTHENING  08/25/2015   Procedure: ACHILLES TENDON LENGTHENING;  Surgeon: Samara Deist, DPM;  Location: [facility] ORS;  Service: Podiatry;;  . APPENDECTOMY    . COLONOSCOPY  06/17/2008  . EYE SURGERY Bilateral    Cataract Extraction with IOL  . FLAT FOOT RECONSTRUCTION-TAL GASTROC RECESSION Left 08/25/2015   Procedure:  ENDOSCOPIC FLAT FOOT RECONSTRUCTION-TAL GASTROC RECESSION;  Surgeon: Samara Deist, DPM;  Location: [facility] ORS;  Service: Podiatry;  Laterality: Left;  Marland Kitchen VAGINAL HYSTERECTOMY      Social History   Socioeconomic History  . Marital status: Married    Spouse name: Not on file  . Number of children: Not on file  . Years of education: Not on file  . Highest education level: Not on file  Occupational History  . Occupation: retired  Scientific laboratory technician  . Financial resource strain: Not on file  . Food insecurity:    Worry: Not on file    Inability: Not on file  . Transportation needs:    Medical: Not on file    Non-medical: Not on file  Tobacco Use  . Smoking status: Former Smoker    Packs/day: 0.50    Years: 3.00    Pack years: 1.50    Types: Cigarettes    Last attempt to quit: 01/08/1963    Years since quitting: 54.6  . Smokeless tobacco: Never Used  Substance and Sexual Activity  . Alcohol use: No    Alcohol/week: 0.0 oz    Comment: does not drink alcohol  . Drug use: No  . Sexual activity: Not on file  Lifestyle  . Physical activity:    Days per week: Not on file    Minutes per session: Not on file  .  Stress: Not on file  Relationships  . Social connections:    Talks on phone: Not on file    Gets together: Not on file    Attends religious service: Not on file    Active member of club or organization: Not on file    Attends meetings of clubs or organizations: Not on file    Relationship status: Not on file  . Intimate partner violence:    Fear of current or ex partner: Not on file    Emotionally abused: Not on file    Physically abused: Not on file    Forced sexual activity: Not on file  Other Topics Concern  . Not on file  Social History Narrative   Admitted to [facility] [place] 11/30/2014   Married   Former smoker   Full Code   Family History  Problem Relation Age of Onset  . Alcohol abuse Mother   . Coronary artery disease Mother   . Stroke Mother   . Liver  cancer Father   . Alcohol abuse Brother   . Coronary artery disease Brother   . Diabetes type II Brother   . Liver cancer Brother       VITAL SIGNS BP 136/69   Pulse 80   Temp 98.6 F (37 C) (Oral)   Resp 18   Ht 5\' 1"  (1.549 m)   Wt 186 lb 11.2 oz (84.7 kg)   SpO2 97%   BMI 35.28 kg/m   Outpatient Encounter Medications as of 08/01/2017  Medication Sig  . acetaminophen (TYLENOL) 500 MG tablet Take 1,000 mg by mouth every 8 (eight) hours as needed for mild pain. Maximum of 3000 mg of acetaminophen in 24 hours , consider all sources  to both as needed, plain  Apap, and as needed oxydocone apap.  Marland Kitchen acetylcysteine (MUCOMYST) 10 % nebulizer solution Take 10 mLs by nebulization daily. Give after AM Duoneb for assistance with mucus expectoration  . alum & mag hydroxide-simeth (MAALOX PLUS) 400-400-40 MG/5ML suspension Take 30 mLs by mouth every 4 (four) hours as needed for indigestion.   . Artificial Saliva (BIOTENE MOISTURIZING MOUTH MT) Use as directed 2 sprays in the mouth or throat 4 (four) times daily as needed.  . Calcium Carbonate-Vitamin D (CALCIUM 600+D) 600-400 MG-UNIT tablet Take 1 tablet by mouth 2 (two) times daily.   . carvedilol (COREG) 3.125 MG tablet Take 3.125 mg by mouth 2 (two) times daily.   . cetirizine (ZYRTEC) 5 MG tablet Take 5 mg by mouth at bedtime.  . cyanocobalamin 1000 MCG tablet Take 1,000 mcg by mouth daily.  Marland Kitchen demeclocycline (DECLOMYCIN) 300 MG tablet Take 300 mg by mouth 2 (two) times daily.  Marland Kitchen dextromethorphan (DELSYM) 30 MG/5ML liquid Take 10 mLs by mouth every 12 (twelve) hours as needed for cough.  . diclofenac sodium (VOLTAREN) 1 % GEL Apply 4 g topically 2 (two) times daily between meals. Apply to both knees for pain  . dipyridamole-aspirin (AGGRENOX) 200-25 MG 12hr capsule Take 1 capsule by mouth every 12 (twelve) hours.   . divalproex (DEPAKOTE SPRINKLE) 125 MG capsule Take 250 mg by mouth 2 (two) times daily. 2 caps  . fluticasone  furoate-vilanterol (BREO ELLIPTA) 100-25 MCG/INH AEPB Inhale 1 puff into the lungs daily.  Marland Kitchen gabapentin (NEURONTIN) 400 MG capsule Take 400 mg by mouth 3 (three) times daily.  Marland Kitchen guaiFENesin (MUCINEX) 600 MG 12 hr tablet Take 1,200 mg by mouth every 12 (twelve) hours.   Marland Kitchen ipratropium (ATROVENT) 0.03 %  nasal spray Place 2 sprays into both nostrils at bedtime.   Marland Kitchen ipratropium-albuterol (DUONEB) 0.5-2.5 (3) MG/3ML SOLN Take 3 mLs by nebulization 3 (three) times daily as needed. Also take 3 times a day  . levothyroxine (SYNTHROID, LEVOTHROID) 25 MCG tablet Take 25 mcg by mouth daily before breakfast.   . methocarbamol (ROBAXIN) 500 MG tablet Take 500 mg by mouth every 6 (six) hours as needed for muscle spasms.  . montelukast (SINGULAIR) 10 MG tablet Take 10 mg by mouth at bedtime.   Marland Kitchen omeprazole (PRILOSEC) 20 MG capsule Take 20 mg by mouth 2 (two) times daily.   Marland Kitchen oxybutynin (DITROPAN) 5 MG tablet Take 5 mg by mouth 2 (two) times daily.   Vladimir Faster Glyc-Propyl Glyc PF (SYSTANE PRESERVATIVE FREE) 0.4-0.3 % SOLN Place 2 drops into both eyes 2 (two) times daily.  . prazosin (MINIPRESS) 1 MG capsule Take 3 mg by mouth at bedtime. 3 capsules  . Salicylic Acid 40 % PADS Apply 1 corn pad to Right 5th toe daily until corn resolved  . sennosides-docusate sodium (SENOKOT-S) 8.6-50 MG tablet Take 1 tablet by mouth 2 (two) times daily as needed for constipation.   . sennosides-docusate sodium (SENOKOT-S) 8.6-50 MG tablet Take 1 tablet by mouth every 3 (three) days.   . Skin Protectants, Misc. (ENDIT EX) Apply liberal amount topically to areas of skin irritation as needed. Ok to leave at bedside.  . sodium chloride 1 g tablet Take 1 g by mouth daily.  Marland Kitchen spironolactone (ALDACTONE) 50 MG tablet Take 50 mg by mouth daily.  Marland Kitchen torsemide (DEMADEX) 20 MG tablet Take 20 mg by mouth daily.  Marland Kitchen triamcinolone (NASACORT ALLERGY 24HR) 55 MCG/ACT AERO nasal inhaler Place 1 spray into the nose daily.  Marland Kitchen UNABLE TO FIND Diet  Type: Regular. No Sodium Restriction. Gatorade on lunch and supper trays for hyponatremia  . venlafaxine XR (EFFEXOR-XR) 150 MG 24 hr capsule Take 150 mg by mouth daily with breakfast.   No facility-administered encounter medications on file as of 08/01/2017.      SIGNIFICANT DIAGNOSTIC EXAMS   LABS REVIEWED: PREVIOUS   04-24-17: wbc 5.8; hgb 11.4; hct 33.7; mcv 95.2; plt 196 glucose 96; bun 27; creat 0.97; k+ 4.0; na++ 134; ca 9.2; liver normal albumin 3.3 chol 177; ldl 121; trig 86 hdl 39; mag 1.8 tsh 7.731;  Vit B 12 329; vit D 41.0;  04-25-17: depakote free 7.3  06-05-17: tsh 5.209; depakote free 5.9  NO NEW LAB.  Review of Systems  Constitutional: Negative for malaise/fatigue.  Respiratory: Negative for cough and shortness of breath.   Cardiovascular: Negative for chest pain, palpitations and leg swelling.  Gastrointestinal: Negative for abdominal pain, constipation and heartburn.  Musculoskeletal: Negative for back pain, joint pain and myalgias.  Skin: Negative.   Neurological: Negative for dizziness.  Psychiatric/Behavioral: The patient is not nervous/anxious.     Physical Exam  Constitutional: She is oriented to person, place, and time. She appears well-developed and well-nourished. No distress.  Neck: No thyromegaly present.  Cardiovascular: Normal rate, regular rhythm, normal heart sounds and intact distal pulses.  Pulmonary/Chest: Effort normal and breath sounds normal. No respiratory distress.  Abdominal: Soft. Bowel sounds are normal. She exhibits no distension. There is no tenderness.  Musculoskeletal: She exhibits no edema.  Is able to move all extremities AFO left foot Left hand splint    Lymphadenopathy:    She has no cervical adenopathy.  Neurological: She is alert and oriented to person, place, and  time.  Skin: Skin is warm and dry. She is not diaphoretic.  Psychiatric: She has a normal mood and affect.    ASSESSMENT/ PLAN:  TODAY:   1.  Cerebral  artery occlusion with cerebral infarction: is neurologically stable; will continue aggrenox twice daily   2. Essential hypertension, benign: is stable b/p 136/69: will continue coreg 3.125 mg twice daily minipress 3 mg nightly; aldactone 50 mg daily   8.  Major depression, single episode: stable will continue depakote 250 mg twice daily stabilize mood and will lower her effexor to 75 mg daily. She would like to eventually be off both the effexor and depakote. Will monitor     MD is aware of resident's narcotic use and is in agreement with current plan of care. We will attempt to wean resident as apropriate   Ok Edwards NP [facility]  Contact [phone] Monday through Friday 8am- 5pm  After hours call [phone]

## 2017-08-07 ENCOUNTER — Encounter
Admission: RE | Admit: 2017-08-07 | Discharge: 2017-08-07 | Disposition: A | Discharge status: Home / Self Care | Payer: PPO | Source: Ambulatory Visit | Attending: Internal Medicine | Admitting: Internal Medicine

## 2017-08-12 ENCOUNTER — Encounter: Payer: Self-pay | Admitting: Adult Health

## 2017-08-12 ENCOUNTER — Non-Acute Institutional Stay (SKILLED_NURSING_FACILITY): Payer: PPO | Admitting: Adult Health

## 2017-08-12 DIAGNOSIS — F329 Major depressive disorder, single episode, unspecified: Secondary | ICD-10-CM

## 2017-08-12 DIAGNOSIS — J45909 Unspecified asthma, uncomplicated: Secondary | ICD-10-CM | POA: Diagnosis not present

## 2017-08-12 DIAGNOSIS — Z8673 Personal history of transient ischemic attack (TIA), and cerebral infarction without residual deficits: Secondary | ICD-10-CM

## 2017-08-12 DIAGNOSIS — K5901 Slow transit constipation: Secondary | ICD-10-CM

## 2017-08-12 DIAGNOSIS — G629 Polyneuropathy, unspecified: Secondary | ICD-10-CM

## 2017-08-12 DIAGNOSIS — N3281 Overactive bladder: Secondary | ICD-10-CM

## 2017-08-12 DIAGNOSIS — E222 Syndrome of inappropriate secretion of antidiuretic hormone: Secondary | ICD-10-CM

## 2017-08-12 DIAGNOSIS — F4310 Post-traumatic stress disorder, unspecified: Secondary | ICD-10-CM

## 2017-08-12 DIAGNOSIS — F39 Unspecified mood [affective] disorder: Secondary | ICD-10-CM | POA: Diagnosis not present

## 2017-08-12 DIAGNOSIS — I10 Essential (primary) hypertension: Secondary | ICD-10-CM | POA: Diagnosis not present

## 2017-08-12 NOTE — Progress Notes (Signed)
[facility] Room Number: [place] of Service:  SNF ([phone]) Provider:  Durenda Age, NP  Patient Care Team: Kirk Ruths, MD as PCP - General (Internal Medicine)  Extended Emergency Contact Information Primary Emergency Contact: Norm Salt Address: [place]          [name] Phone: [phone] Mobile Phone: [phone] Relation: Spouse Secondary Emergency Contact: Barry Dienes, [place] [address] Johnnette Litter of [place] Phone: [phone] Mobile Phone: [phone] Relation: Daughter  Code Status:  FULL CODE  Goals of care: Advanced Directive information Advanced Directives 08/12/2017  Does Patient Have a Medical Advance Directive? No  Type of Advance Directive -  Does patient want to make changes to medical advance directive? -  Copy of [place] in Chart? -  Would patient like information on creating a medical advance directive? No - Patient declined     Chief Complaint  Patient presents with  . Medical Management of Chronic Issues    Routine Visit    HPI:  [name] seen today for medical management of chronic diseases.  She is a long-term care resident of [place].  She has a PMH of SIADH, depression/anxiety, history of CVA with left-sided hemiparesis, HLD, HTN, and OA. She was seen in the room today. She complained of constipation. BPs noted to be stable - 138/62, 118/56, 136/69, 109/57.    Past Medical History:  Diagnosis Date  . Anxiety   . Arthritis   . Asthma   . 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 Depression   . Depression with anxiety   . GERD (gastroesophageal reflux disease)   . Hemiparesis affecting left side as late effect of cerebrovascular accident (CVA) ([place])   . History of CVA (cerebrovascular accident)   . Hot flashes   . Hyperlipidemia   . Hyperlipidemia, unspecified   .  Hypertension   . Muscle hypertonicity   . Osteoarthritis   . Stroke ([place])   . TIA (transient ischemic attack)    Past Surgical History:  Procedure Laterality Date  . ACHILLES TENDON LENGTHENING  08/25/2015   Procedure: ACHILLES TENDON LENGTHENING;  Surgeon: Samara Deist, DPM;  Location: [facility] ORS;  Service: Podiatry;;  . APPENDECTOMY    . COLONOSCOPY  06/17/2008  . EYE SURGERY Bilateral    Cataract Extraction with IOL  . FLAT FOOT RECONSTRUCTION-TAL GASTROC RECESSION Left 08/25/2015   Procedure: ENDOSCOPIC FLAT FOOT RECONSTRUCTION-TAL GASTROC RECESSION;  Surgeon: Samara Deist, DPM;  Location: [facility] ORS;  Service: Podiatry;  Laterality: Left;  Marland Kitchen VAGINAL HYSTERECTOMY      Allergies  Allergen Reactions  . Tizanidine Other (See Comments)    HYPOTENSION AND UNRESPONSIVENESS  . Ace Inhibitors Cough  . Latex Hives  . Lisinopril Cough  . Tetracycline Other (See Comments)     REACTION: yeast  . Zanaflex [Tizanidine Hcl] Other (See Comments)    "drowsy"    Outpatient Encounter Medications as of 08/12/2017  Medication Sig  . acetaminophen (TYLENOL) 500 MG tablet Take 1,000 mg by mouth every 8 (eight) hours as needed for mild pain. Maximum of 3000 mg of acetaminophen in 24 hours , consider all sources  to both as needed, plain  Apap, and as needed oxydocone apap.  Marland Kitchen acetylcysteine (MUCOMYST) 10 % nebulizer solution Take 10 mLs by nebulization daily. Give after AM Duoneb for  assistance with mucus expectoration  . alum & mag hydroxide-simeth (MAALOX PLUS) 400-400-40 MG/5ML suspension Take 30 mLs by mouth every 4 (four) hours as needed for indigestion.   . Artificial Saliva (BIOTENE MOISTURIZING MOUTH MT) Use as directed 2 sprays in the mouth or throat 4 (four) times daily as needed.  . Calcium Carbonate-Vitamin D (CALCIUM 600+D) 600-400 MG-UNIT tablet Take 1 tablet by mouth 2 (two) times daily.   . carvedilol (COREG) 3.125 MG tablet Take 3.125 mg by mouth 2 (two) times daily.   . cetirizine  (ZYRTEC) 5 MG tablet Take 5 mg by mouth at bedtime.  . cyanocobalamin 1000 MCG tablet Take 1,000 mcg by mouth daily.  Marland Kitchen demeclocycline (DECLOMYCIN) 300 MG tablet Take 300 mg by mouth 2 (two) times daily.  Marland Kitchen dextromethorphan (DELSYM) 30 MG/5ML liquid Take 10 mLs by mouth every 12 (twelve) hours as needed for cough.  . diclofenac sodium (VOLTAREN) 1 % GEL Apply 4 g topically 2 (two) times daily between meals. Apply to both knees for pain  . dipyridamole-aspirin (AGGRENOX) 200-25 MG 12hr capsule Take 1 capsule by mouth every 12 (twelve) hours.   . divalproex (DEPAKOTE SPRINKLE) 125 MG capsule Take 250 mg by mouth 2 (two) times daily. 2 caps  . fluticasone furoate-vilanterol (BREO ELLIPTA) 100-25 MCG/INH AEPB Inhale 1 puff into the lungs daily.  Marland Kitchen gabapentin (NEURONTIN) 400 MG capsule Take 400 mg by mouth 3 (three) times daily.  Marland Kitchen guaiFENesin (MUCINEX) 600 MG 12 hr tablet Take 1,200 mg by mouth every 12 (twelve) hours.   Marland Kitchen ipratropium (ATROVENT) 0.03 % nasal spray Place 2 sprays into both nostrils at bedtime.   Marland Kitchen ipratropium-albuterol (DUONEB) 0.5-2.5 (3) MG/3ML SOLN Take 3 mLs by nebulization 3 (three) times daily as needed. Also take 3 times a day  . levothyroxine (SYNTHROID, LEVOTHROID) 25 MCG tablet Take 25 mcg by mouth daily before breakfast.   . Menthol (HALLS COUGH DROPS MT) Take 3.2 mg by mouth as needed  . methocarbamol (ROBAXIN) 500 MG tablet Take 500 mg by mouth every 6 (six) hours as needed for muscle spasms.  . montelukast (SINGULAIR) 10 MG tablet Take 10 mg by mouth at bedtime.   Marland Kitchen omeprazole (PRILOSEC) 20 MG capsule Take 20 mg by mouth 2 (two) times daily.   Marland Kitchen oxybutynin (DITROPAN) 5 MG tablet Take 5 mg by mouth 2 (two) times daily.   Vladimir Faster Glyc-Propyl Glyc PF (SYSTANE PRESERVATIVE FREE) 0.4-0.3 % SOLN Place 2 drops into both eyes 2 (two) times daily.  . prazosin (MINIPRESS) 1 MG capsule Take 3 mg by mouth at bedtime. 3 capsules  . Salicylic Acid 40 % PADS Apply 1 corn pad to  Right 5th toe daily until corn resolved  . sennosides-docusate sodium (SENOKOT-S) 8.6-50 MG tablet Take 1 tablet by mouth 2 (two) times daily as needed for constipation.   . sennosides-docusate sodium (SENOKOT-S) 8.6-50 MG tablet Take 1 tablet by mouth every 3 (three) days.   . Skin Protectants, Misc. (ENDIT EX) Apply liberal amount topically to areas of skin irritation as needed. Ok to leave at bedside.  . sodium chloride 1 g tablet Take 1 g by mouth daily.  Marland Kitchen spironolactone (ALDACTONE) 50 MG tablet Take 50 mg by mouth daily.  Marland Kitchen torsemide (DEMADEX) 20 MG tablet Take 20 mg by mouth daily.  Marland Kitchen triamcinolone (NASACORT ALLERGY 24HR) 55 MCG/ACT AERO nasal inhaler Place 1 spray into the nose daily.  Marland Kitchen UNABLE TO FIND Diet Type: Regular. No Sodium Restriction. Gatorade on lunch  and supper trays for hyponatremia  . venlafaxine XR (EFFEXOR-XR) 75 MG 24 hr capsule Take 75 mg by mouth daily with breakfast.  . [DISCONTINUED] venlafaxine XR (EFFEXOR-XR) 150 MG 24 hr capsule Take 150 mg by mouth daily with breakfast.   No facility-administered encounter medications on file as of 08/12/2017.     Review of Systems  GENERAL: No change in appetite, no fatigue, no weight changes, no fever, chills or weakness MOUTH and THROAT: Denies oral discomfort, gingival pain or bleeding RESPIRATORY: no cough, SOB, DOE, wheezing, hemoptysis CARDIAC: No chest pain, edema or palpitations GI: +constipation GU: Denies dysuria, frequency, hematuria, or discharge PSYCHIATRIC: Denies feelings of depression or anxiety. No report of hallucinations, insomnia, paranoia, or agitation   Immunization History  Administered Date(s) Administered  . Influenza Split 09/30/2014  . Influenza Whole 01/08/2004, 10/08/2006, 11/22/2008  . Influenza-Unspecified 08/13/2013, 09/21/2015, 09/25/2016  . PPD Test 01/08/2016  . Pneumococcal Conjugate-13 08/13/2013  . Pneumococcal Polysaccharide-23 10/24/2000, 11/30/2014  . Td 01/07/2001  . Zoster  01/07/2006   Pertinent  Health Maintenance Due  Topic Date Due  . INFLUENZA VACCINE  08/07/2017  . DEXA SCAN  Completed  . PNA vac Low Risk Adult  Completed      Vitals:   08/12/17 1114  BP: 138/62  Pulse: 78  Resp: 14  Temp: 98.3 F (36.8 C)  TempSrc: Oral  SpO2: 95%  Weight: 186 lb 11.2 oz (84.7 kg)  Height: 5\' 1"  (1.549 m)   Body mass index is 35.28 kg/m.  Physical Exam  GENERAL APPEARANCE: Well nourished. In no acute distress.Obese SKIN:  Skin is warm and dry. MOUTH and THROAT: Lips are without lesions. Oral mucosa is moist and without lesions. Left facial droop RESPIRATORY: Breathing is even & unlabored, BS CTAB CARDIAC: RRR, no murmur,no extra heart sounds, no edema GI: Abdomen soft, normal BS, no masses, no tenderness EXTREMITIES:  Has left-sided weakness, left hand contracted and has splint, left foot with AFO PSYCHIATRIC: Alert to self, disoriented to time and place.  Affect and behavior are appropriate  Labs reviewed: Recent Labs    09/03/16 0538  01/09/17 0412 02/13/17 1350 04/24/17 0525  NA 128*   < > 133* 133* 134*  K 3.4*   < > 5.0 3.4* 4.0  CL 87*   < > 95* 95* 96*  CO2 32   < > 32 28 31  GLUCOSE 94   < > 95 153* 96  BUN 17   < > 26* 22* 27*  CREATININE 0.92   < > 0.78 0.93 0.97  CALCIUM 9.5   < > 9.1 9.3 9.2  MG 1.8  --   --   --  1.8   < > = values in this interval not displayed.   Recent Labs    09/03/16 0538 04/24/17 0525  AST 16 16  ALT 10* 10*  ALKPHOS 47 49  BILITOT 0.4 0.5  PROT 6.1* 5.8*  ALBUMIN 3.4* 3.3*   Recent Labs    09/03/16 0538 04/24/17 0525  WBC 6.1 5.8  NEUTROABS 3.3 3.3  HGB 11.7* 11.4*  HCT 33.4* 33.7*  MCV 90.7 95.2  PLT 208 196   Lab Results  Component Value Date   TSH 5.209 (H) 06/05/2017   Lab Results  Component Value Date   HGBA1C 5.6 06/23/2009   Lab Results  Component Value Date   CHOL 177 04/24/2017   HDL 39 (L) 04/24/2017   LDLCALC 121 (H) 04/24/2017   LDLDIRECT 161.4 12/14/2008  TRIG 86 04/24/2017   CHOLHDL 4.5 04/24/2017     Assessment/Plan  1. Essential hypertension, benign - well-controlled, continue carvedilol 3.125 mg 1 tab 3 times a day, aldactone 50 mg1 tab daily and torsemide 20 mg 1 tabdaily    2. Uncomplicated asthma, unspecified asthma severity, unspecified whether persistent - no wheezing nor SOB, continue Montelukast 10 mg 1 tab daily bedtime, ipratropium albuterol neb when necessary, Cetirizine 5 mg 1 tab daily at bedtime   3. OAB (overactive bladder) - continue Oxybutynin 5 mg1 tab twice a day   4. History of CVA (cerebrovascular accident) - stable, continue aggrenox ER 5-200 mg 1 capsule every 12 hours,   5. Mood disorder (HCC) - mood is stable,continue Depakote sprinkles 125 mg daily 2 capsules = 250 mg BID   6. Neuropathy - continue gabapentin 400 mg1 capsule 3 times a day   7. PTSD (post-traumatic stress disorder) - stable, continue prazosin 1 mg give 3 capsules = 3 mg daily at bedtime   8. SIADH (syndrome of inappropriate ADH production) (HCC) - continue Demeclocycline 300 mg 1 tab twice a day nd sodium chloride1 gdaily .  9. Slow transit constipation - start senna S 8.6-50 mg1 tab daily at bedtime  10. Major depression - mood is stable, continue venlafaxine  ER75 mg 1 capsule daily    Family/ staff Communication: Discussed plan of care with resident.  Labs/tests ordered:  None  Goals of care:   Long-term care.   Durenda Age, NP [facility] [phone] (Monday-Friday 8:00 a.m. - 5:00 p.m.) [phone] (after hours)

## 2017-08-18 ENCOUNTER — Other Ambulatory Visit
Admission: RE | Admit: 2017-08-18 | Discharge: 2017-08-18 | Disposition: A | Discharge status: Home / Self Care | Payer: PPO | Source: Ambulatory Visit | Attending: Adult Health | Admitting: Adult Health

## 2017-08-18 ENCOUNTER — Encounter: Payer: Self-pay | Admitting: Adult Health

## 2017-08-18 ENCOUNTER — Non-Acute Institutional Stay (SKILLED_NURSING_FACILITY): Payer: PPO | Admitting: Adult Health

## 2017-08-18 DIAGNOSIS — N179 Acute kidney failure, unspecified: Secondary | ICD-10-CM

## 2017-08-18 DIAGNOSIS — R110 Nausea: Secondary | ICD-10-CM | POA: Diagnosis not present

## 2017-08-18 DIAGNOSIS — J189 Pneumonia, unspecified organism: Secondary | ICD-10-CM | POA: Diagnosis not present

## 2017-08-18 DIAGNOSIS — R112 Nausea with vomiting, unspecified: Secondary | ICD-10-CM | POA: Diagnosis not present

## 2017-08-18 LAB — CBC WITH DIFFERENTIAL/PLATELET
Basophils Absolute: 0 10*3/uL (ref 0–0.1)
Basophils Relative: 0 %
EOS ABS: 0.1 10*3/uL (ref 0–0.7)
EOS PCT: 1 %
HCT: 29.4 % — ABNORMAL LOW (ref 35.0–47.0)
Hemoglobin: 10 g/dL — ABNORMAL LOW (ref 12.0–16.0)
LYMPHS ABS: 1.1 10*3/uL (ref 1.0–3.6)
Lymphocytes Relative: 9 %
MCH: 31.4 pg (ref 26.0–34.0)
MCHC: 34 g/dL (ref 32.0–36.0)
MCV: 92.2 fL (ref 80.0–100.0)
MONOS PCT: 12 %
Monocytes Absolute: 1.5 10*3/uL — ABNORMAL HIGH (ref 0.2–0.9)
Neutro Abs: 9.1 10*3/uL — ABNORMAL HIGH (ref 1.4–6.5)
Neutrophils Relative %: 78 %
PLATELETS: 278 10*3/uL (ref 150–440)
RBC: 3.19 MIL/uL — ABNORMAL LOW (ref 3.80–5.20)
RDW: 13.1 % (ref 11.5–14.5)
WBC: 11.8 10*3/uL — ABNORMAL HIGH (ref 3.6–11.0)

## 2017-08-18 LAB — COMPREHENSIVE METABOLIC PANEL
ALK PHOS: 229 U/L — AB (ref 38–126)
ALT: 34 U/L (ref 0–44)
ANION GAP: 10 (ref 5–15)
AST: 33 U/L (ref 15–41)
Albumin: 2.4 g/dL — ABNORMAL LOW (ref 3.5–5.0)
BUN: 13 mg/dL (ref 8–23)
CALCIUM: 9.1 mg/dL (ref 8.9–10.3)
CO2: 28 mmol/L (ref 22–32)
Chloride: 90 mmol/L — ABNORMAL LOW (ref 98–111)
Creatinine, Ser: 1.01 mg/dL — ABNORMAL HIGH (ref 0.44–1.00)
GFR, EST NON AFRICAN AMERICAN: 52 mL/min — AB (ref >60)
Glucose, Bld: 94 mg/dL (ref 70–99)
Potassium: 3.7 mmol/L (ref 3.5–5.1)
Sodium: 128 mmol/L — ABNORMAL LOW (ref 135–145)
Total Bilirubin: 0.7 mg/dL (ref 0.3–1.2)
Total Protein: 6.1 g/dL — ABNORMAL LOW (ref 6.5–8.1)

## 2017-08-18 NOTE — Progress Notes (Signed)
Location:   [facility] [place] Room Number: 302A Place of Service:  SNF (31)   CODE STATUS: FULL  Allergies  Allergen Reactions  . Tizanidine Other (See Comments)    HYPOTENSION AND UNRESPONSIVENESS  . Ace Inhibitors Cough  . Latex Hives  . Lisinopril Cough  . Tetracycline Other (See Comments)     REACTION: yeast  . Zanaflex [Tizanidine Hcl] Other (See Comments)    "drowsy"    Chief Complaint  Patient presents with  . Acute Visit    Fever    HPI:  She has been running a low grade temp for the past several days. She is wheezing; she has increased confusion and more lethargy. Her husband states that she has been "sick" for the past 2-3 weeks with nausea. She is unable to participate in the hpi or ros. Will get stat cxr; cbc; cmp; ua/c&s.   Past Medical History:  Diagnosis Date  . Anxiety   . Arthritis   . Asthma   . 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 Depression   . Depression with anxiety   . GERD (gastroesophageal reflux disease)   . Hemiparesis affecting left side as late effect of cerebrovascular accident (CVA) ([place])   . History of CVA (cerebrovascular accident)   . Hot flashes   . Hyperlipidemia   . Hyperlipidemia, unspecified   . Hypertension   . Muscle hypertonicity   . Osteoarthritis   . Stroke ([place])   . TIA (transient ischemic attack)     Past Surgical History:  Procedure Laterality Date  . ACHILLES TENDON LENGTHENING  08/25/2015   Procedure: ACHILLES TENDON LENGTHENING;  Surgeon: Samara Deist, DPM;  Location: [facility] ORS;  Service: Podiatry;;  . APPENDECTOMY    . COLONOSCOPY  06/17/2008  . EYE SURGERY Bilateral    Cataract Extraction with IOL  . FLAT FOOT RECONSTRUCTION-TAL GASTROC RECESSION Left 08/25/2015   Procedure: ENDOSCOPIC FLAT FOOT RECONSTRUCTION-TAL GASTROC RECESSION;  Surgeon: Samara Deist, DPM;  Location: [facility] ORS;  Service: Podiatry;  Laterality: Left;  Marland Kitchen VAGINAL HYSTERECTOMY      Social History    Socioeconomic History  . Marital status: Married    Spouse name: Not on file  . Number of children: Not on file  . Years of education: Not on file  . Highest education level: Not on file  Occupational History  . Occupation: retired  Scientific laboratory technician  . Financial resource strain: Not on file  . Food insecurity:    Worry: Not on file    Inability: Not on file  . Transportation needs:    Medical: Not on file    Non-medical: Not on file  Tobacco Use  . Smoking status: Former Smoker    Packs/day: 0.50    Years: 3.00    Pack years: 1.50    Types: Cigarettes    Last attempt to quit: 01/08/1963    Years since quitting: 54.6  . Smokeless tobacco: Never Used  Substance and Sexual Activity  . Alcohol use: No    Alcohol/week: 0.0 standard drinks    Comment: does not drink alcohol  . Drug use: No  . Sexual activity: Not on file  Lifestyle  . Physical activity:    Days per week: Not on file    Minutes per session: Not on file  . Stress: Not on file  Relationships  . Social connections:    Talks on phone: Not on file    Gets together: Not  on file    Attends religious service: Not on file    Active member of club or organization: Not on file    Attends meetings of clubs or organizations: Not on file    Relationship status: Not on file  . Intimate partner violence:    Fear of current or ex partner: Not on file    Emotionally abused: Not on file    Physically abused: Not on file    Forced sexual activity: Not on file  Other Topics Concern  . Not on file  Social History Narrative   Admitted to [facility] [place] 11/30/2014   Married   Former smoker   Full Code   Family History  Problem Relation Age of Onset  . Alcohol abuse Mother   . Coronary artery disease Mother   . Stroke Mother   . Liver cancer Father   . Alcohol abuse Brother   . Coronary artery disease Brother   . Diabetes type II Brother   . Liver cancer Brother       VITAL SIGNS BP (!) 101/56   Pulse 97    Temp 99.6 F (37.6 C)   Resp 20   Ht _0  (1.549 m)   Wt 190 lb 9.6 oz (86.5 kg)   SpO2 91%   BMI 36.01 kg/m   Outpatient Encounter Medications as of 08/18/2017  Medication Sig  . acetaminophen (TYLENOL) 500 MG tablet Take 1,000 mg by mouth every 8 (eight) hours as needed for mild pain. Maximum of 3000 mg of acetaminophen in 24 hours , consider all sources  to both as needed, plain  Apap, and as needed oxydocone apap.  Marland Kitchen acetylcysteine (MUCOMYST) 10 % nebulizer solution Take 10 mLs by nebulization daily. Give after AM Duoneb for assistance with mucus expectoration  . alum & mag hydroxide-simeth (MAALOX PLUS) 400-400-40 MG/5ML suspension Take 30 mLs by mouth every 4 (four) hours as needed for indigestion.   . Artificial Saliva (BIOTENE MOISTURIZING MOUTH MT) Use as directed 2 sprays in the mouth or throat 4 (four) times daily as needed.  . Calcium Carbonate-Vitamin D (CALCIUM 600+D) 600-400 MG-UNIT tablet Take 1 tablet by mouth 2 (two) times daily.   . carvedilol (COREG) 3.125 MG tablet Take 3.125 mg by mouth 2 (two) times daily.   . cetirizine (ZYRTEC) 5 MG tablet Take 5 mg by mouth at bedtime.  . cyanocobalamin 1000 MCG tablet Take 1,000 mcg by mouth daily.  Marland Kitchen demeclocycline (DECLOMYCIN) 300 MG tablet Take 300 mg by mouth 2 (two) times daily.  Marland Kitchen dextromethorphan (DELSYM) 30 MG/5ML liquid Take 10 mLs by mouth every 12 (twelve) hours as needed for cough.  . diclofenac sodium (VOLTAREN) 1 % GEL Apply 4 g topically 2 (two) times daily between meals. Apply to both knees for pain  . dipyridamole-aspirin (AGGRENOX) 200-25 MG 12hr capsule Take 1 capsule by mouth every 12 (twelve) hours.   . divalproex (DEPAKOTE SPRINKLE) 125 MG capsule Take 250 mg by mouth 2 (two) times daily. 2 caps  . fluticasone furoate-vilanterol (BREO ELLIPTA) 100-25 MCG/INH AEPB Inhale 1 puff into the lungs daily.  Marland Kitchen gabapentin (NEURONTIN) 400 MG capsule Take 400 mg by mouth 3 (three) times daily.  Marland Kitchen guaiFENesin (MUCINEX)  600 MG 12 hr tablet Take 1,200 mg by mouth every 12 (twelve) hours.   Marland Kitchen ipratropium (ATROVENT) 0.03 % nasal spray Place 2 sprays into both nostrils at bedtime.   Marland Kitchen ipratropium-albuterol (DUONEB) 0.5-2.5 (3) MG/3ML SOLN Take 3 mLs by nebulization  3 (three) times daily as needed. Also take 3 times a day  . levothyroxine (SYNTHROID, LEVOTHROID) 25 MCG tablet Take 25 mcg by mouth daily before breakfast.   . magnesium hydroxide (MILK OF MAGNESIA) 400 MG/5ML suspension Take 30 mLs by mouth every 4 (four) hours as needed for mild constipation.  . Menthol (HALLS COUGH DROPS MT) Take 3.2 mg by mouth as needed  . methocarbamol (ROBAXIN) 500 MG tablet Take 500 mg by mouth every 6 (six) hours as needed for muscle spasms.  . montelukast (SINGULAIR) 10 MG tablet Take 10 mg by mouth at bedtime.   Marland Kitchen omeprazole (PRILOSEC) 20 MG capsule Take 20 mg by mouth 2 (two) times daily.   Marland Kitchen oxybutynin (DITROPAN) 5 MG tablet Take 5 mg by mouth 2 (two) times daily.   Vladimir Faster Glyc-Propyl Glyc PF (SYSTANE PRESERVATIVE FREE) 0.4-0.3 % SOLN Place 2 drops into both eyes 2 (two) times daily.  . prazosin (MINIPRESS) 1 MG capsule Take 3 mg by mouth at bedtime. 3 capsules  . Salicylic Acid 40 % PADS Apply 1 corn pad to Right 5th toe daily until corn resolved  . sennosides-docusate sodium (SENOKOT-S) 8.6-50 MG tablet Take 1 tablet by mouth 2 (two) times daily as needed for constipation. Also take 1 tab by mouth at bedtime daily.  . sennosides-docusate sodium (SENOKOT-S) 8.6-50 MG tablet Take 1 tablet by mouth every 3 (three) days.   . Skin Protectants, Misc. (ENDIT EX) Apply liberal amount topically to areas of skin irritation as needed. Ok to leave at bedside.  . sodium chloride 1 g tablet Take 1 g by mouth daily.  Marland Kitchen spironolactone (ALDACTONE) 50 MG tablet Take 50 mg by mouth daily.  Marland Kitchen torsemide (DEMADEX) 20 MG tablet Take 20 mg by mouth daily.  Marland Kitchen triamcinolone (NASACORT ALLERGY 24HR) 55 MCG/ACT AERO nasal inhaler Place 1 spray  into the nose daily.  Marland Kitchen UNABLE TO FIND Diet Type: Regular. No Sodium Restriction. Gatorade on lunch and supper trays for hyponatremia  . venlafaxine XR (EFFEXOR-XR) 75 MG 24 hr capsule Take 75 mg by mouth daily with breakfast.   No facility-administered encounter medications on file as of 08/18/2017.      SIGNIFICANT DIAGNOSTIC EXAMS  LABS REVIEWED: PREVIOUS   04-24-17: wbc 5.8; hgb 11.4; hct 33.7; mcv 95.2; plt 196 glucose 96; bun 27; creat 0.97; k+ 4.0; na++ 134; ca 9.2; liver normal albumin 3.3 chol 177; ldl 121; trig 86 hdl 39; mag 1.8 tsh 7.731;  Vit B 12 329; vit D 41.0;  04-25-17: depakote free 7.3  06-05-17: tsh 5.209; depakote free 5.9  TODAY:  08-18-17: wbc 11.8; hgb 10.0; hct 29.4; mcv 92.2; plt 278; glucose 94; bun 13; creat 1.01; k+ 3.7; na++ 128; ca 9.1; alk phos 229; albumin 2.4   Review of Systems  Unable to perform ROS: Medical condition    Physical Exam  Constitutional: She appears well-developed and well-nourished. No distress.  Neck: No thyromegaly present.  Cardiovascular: Normal rate, regular rhythm, normal heart sounds and intact distal pulses.  Pulmonary/Chest: No respiratory distress. She has wheezes.  Increased effort with wheezing and rhonchi present   Abdominal: Soft. Bowel sounds are normal. She exhibits no distension. There is no tenderness.  Musculoskeletal: She exhibits no edema.  Is able to move all extremities AFO left foot Left hand splint    Lymphadenopathy:    She has no cervical adenopathy.  Neurological:  Is aware   Skin: Skin is warm and dry. She is not diaphoretic.  ASSESSMENT/ PLAN:  TODAY:   1.HCAP 2.  Acute renal failure  Will begin NS at 75 cc per hour for 2 liters  Will then repeat cbc; cmp Will setup a liver ultrasound due to elevate alk phos Will insert midline Will begin rocephin 1 mg IV for 10 days and will monitor     MD is aware of resident's narcotic use and is in agreement with current plan of care. We will  attempt to wean resident as apropriate   Ok Edwards NP [facility]  Contact [phone] Monday through Friday 8am- 5pm  After hours call [phone]

## 2017-08-19 DIAGNOSIS — R945 Abnormal results of liver function studies: Secondary | ICD-10-CM | POA: Diagnosis not present

## 2017-08-20 ENCOUNTER — Encounter: Payer: Self-pay | Admitting: Adult Health

## 2017-08-21 ENCOUNTER — Other Ambulatory Visit
Admission: RE | Admit: 2017-08-21 | Discharge: 2017-08-21 | Disposition: A | Discharge status: Home / Self Care | Payer: PPO | Source: Ambulatory Visit | Attending: Adult Health | Admitting: Adult Health

## 2017-08-21 DIAGNOSIS — D72829 Elevated white blood cell count, unspecified: Secondary | ICD-10-CM | POA: Diagnosis not present

## 2017-08-21 LAB — COMPREHENSIVE METABOLIC PANEL
ALK PHOS: 163 U/L — AB (ref 38–126)
ALT: 17 U/L (ref 0–44)
AST: 14 U/L — ABNORMAL LOW (ref 15–41)
Albumin: 2.1 g/dL — ABNORMAL LOW (ref 3.5–5.0)
Anion gap: 8 (ref 5–15)
BILIRUBIN TOTAL: 0.6 mg/dL (ref 0.3–1.2)
BUN: 15 mg/dL (ref 8–23)
CALCIUM: 8.7 mg/dL — AB (ref 8.9–10.3)
CO2: 28 mmol/L (ref 22–32)
CREATININE: 1.24 mg/dL — AB (ref 0.44–1.00)
Chloride: 97 mmol/L — ABNORMAL LOW (ref 98–111)
GFR, EST AFRICAN AMERICAN: 47 mL/min — AB (ref >60)
GFR, EST NON AFRICAN AMERICAN: 41 mL/min — AB (ref >60)
Glucose, Bld: 96 mg/dL (ref 70–99)
Potassium: 3.6 mmol/L (ref 3.5–5.1)
Sodium: 133 mmol/L — ABNORMAL LOW (ref 135–145)
Total Protein: 5.7 g/dL — ABNORMAL LOW (ref 6.5–8.1)

## 2017-08-21 LAB — CBC WITH DIFFERENTIAL/PLATELET
Band Neutrophils: 1 %
Basophils Absolute: 0 10*3/uL (ref 0–0.1)
Basophils Relative: 0 %
Blasts: 0 %
EOS PCT: 3 %
Eosinophils Absolute: 0.3 10*3/uL (ref 0–0.7)
HEMATOCRIT: 28.5 % — AB (ref 35.0–47.0)
HEMOGLOBIN: 9.7 g/dL — AB (ref 12.0–16.0)
LYMPHS ABS: 1.5 10*3/uL (ref 1.0–3.6)
Lymphocytes Relative: 18 %
MCH: 31.6 pg (ref 26.0–34.0)
MCHC: 34.2 g/dL (ref 32.0–36.0)
MCV: 92.5 fL (ref 80.0–100.0)
MYELOCYTES: 0 %
Metamyelocytes Relative: 3 %
Monocytes Absolute: 0.7 10*3/uL (ref 0.2–0.9)
Monocytes Relative: 8 %
NEUTROS PCT: 67 %
NRBC: 0 /100{WBCs}
Neutro Abs: 5.8 10*3/uL (ref 1.4–6.5)
Other: 0 %
PROMYELOCYTES RELATIVE: 0 %
Platelets: 343 10*3/uL (ref 150–440)
RBC: 3.08 MIL/uL — AB (ref 3.80–5.20)
RDW: 13.3 % (ref 11.5–14.5)
WBC: 8.3 10*3/uL (ref 3.6–11.0)

## 2017-08-25 DIAGNOSIS — Z789 Other specified health status: Secondary | ICD-10-CM | POA: Diagnosis not present

## 2017-08-25 DIAGNOSIS — I10 Essential (primary) hypertension: Secondary | ICD-10-CM | POA: Diagnosis not present

## 2017-08-25 DIAGNOSIS — J159 Unspecified bacterial pneumonia: Secondary | ICD-10-CM | POA: Diagnosis not present

## 2017-08-25 DIAGNOSIS — F325 Major depressive disorder, single episode, in full remission: Secondary | ICD-10-CM | POA: Diagnosis not present

## 2017-08-25 DIAGNOSIS — I69354 Hemiplegia and hemiparesis following cerebral infarction affecting left non-dominant side: Secondary | ICD-10-CM | POA: Diagnosis not present

## 2017-08-27 DIAGNOSIS — N179 Acute kidney failure, unspecified: Secondary | ICD-10-CM | POA: Insufficient documentation

## 2017-09-01 ENCOUNTER — Other Ambulatory Visit: Payer: Self-pay | Admitting: Gastroenterology

## 2017-09-01 DIAGNOSIS — R1011 Right upper quadrant pain: Secondary | ICD-10-CM | POA: Diagnosis not present

## 2017-09-01 DIAGNOSIS — K5909 Other constipation: Secondary | ICD-10-CM | POA: Diagnosis not present

## 2017-09-07 ENCOUNTER — Encounter
Admission: RE | Admit: 2017-09-07 | Discharge: 2017-09-07 | Disposition: A | Discharge status: Home / Self Care | Payer: PPO | Source: Ambulatory Visit | Attending: Internal Medicine | Admitting: Internal Medicine

## 2017-09-11 ENCOUNTER — Non-Acute Institutional Stay (SKILLED_NURSING_FACILITY): Payer: PPO | Admitting: Adult Health

## 2017-09-11 ENCOUNTER — Encounter: Payer: Self-pay | Admitting: Adult Health

## 2017-09-11 DIAGNOSIS — Z8673 Personal history of transient ischemic attack (TIA), and cerebral infarction without residual deficits: Secondary | ICD-10-CM

## 2017-09-11 DIAGNOSIS — F329 Major depressive disorder, single episode, unspecified: Secondary | ICD-10-CM

## 2017-09-11 DIAGNOSIS — E034 Atrophy of thyroid (acquired): Secondary | ICD-10-CM | POA: Diagnosis not present

## 2017-09-11 DIAGNOSIS — E222 Syndrome of inappropriate secretion of antidiuretic hormone: Secondary | ICD-10-CM | POA: Diagnosis not present

## 2017-09-11 DIAGNOSIS — F39 Unspecified mood [affective] disorder: Secondary | ICD-10-CM

## 2017-09-11 DIAGNOSIS — K219 Gastro-esophageal reflux disease without esophagitis: Secondary | ICD-10-CM

## 2017-09-11 DIAGNOSIS — F4310 Post-traumatic stress disorder, unspecified: Secondary | ICD-10-CM | POA: Diagnosis not present

## 2017-09-11 DIAGNOSIS — J45909 Unspecified asthma, uncomplicated: Secondary | ICD-10-CM | POA: Diagnosis not present

## 2017-09-11 DIAGNOSIS — I10 Essential (primary) hypertension: Secondary | ICD-10-CM | POA: Diagnosis not present

## 2017-09-11 NOTE — Progress Notes (Signed)
[facility]  [facility] Room Number: [place] of Service:  SNF [phone] Provider:  Durenda Age, NP  Patient Care Team: Kirk Ruths, MD as PCP - General (Internal Medicine)  Extended Emergency Contact Information Primary Emergency Contact: Norm Salt Address: [place]          [name] Phone: [phone] Mobile Phone: [phone] Relation: Spouse Secondary Emergency Contact: Barry Dienes, [place] [address] [address] [place]itter of [place] Phone: [phone] Mobile Phone: [phone] Relation: Daughter  Code Status:  FULL  Goals of care: Advanced Directive information Advanced Directives 09/11/2017  Does Patient Have a Medical Advance Directive? No  Type of Advance Directive -  Does patient want to make changes to medical advance directive? -  Copy of [place] in Chart? -  Would patient like information on creating a medical advance directive? No - Patient declined     Chief Complaint  Patient presents with  . Medical Management of Chronic Issues    Routine Visit    HPI:  [name] seen today for medical management of chronic diseases. She has PMH of SIADH depression/anxiety, history of CVA with left-sided hemiparesis hyperlipidemia, hypertension, and osteoarthritis. She was seen in her room today. NO SOB nor wheezing.   Past Medical History:  Diagnosis Date  . Arthritis   . Asthma   . 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 Depression with anxiety   . GERD (gastroesophageal reflux disease)   . Hemiparesis affecting left side as late effect of cerebrovascular accident (CVA) ([place])   . Hot flashes   . Hyperlipidemia   . Hypertension   . Muscle hypertonicity   . Osteoarthritis   . Stroke ([place])   . TIA (transient ischemic attack)    Past Surgical History:  Procedure Laterality Date  . ACHILLES TENDON LENGTHENING   08/25/2015   Procedure: ACHILLES TENDON LENGTHENING;  Surgeon: Samara Deist, DPM;  Location: [facility] ORS;  Service: Podiatry;;  . APPENDECTOMY    . COLONOSCOPY  06/17/2008  . EYE SURGERY Bilateral    Cataract Extraction with IOL  . FLAT FOOT RECONSTRUCTION-TAL GASTROC RECESSION Left 08/25/2015   Procedure: ENDOSCOPIC FLAT FOOT RECONSTRUCTION-TAL GASTROC RECESSION;  Surgeon: Samara Deist, DPM;  Location: [facility] ORS;  Service: Podiatry;  Laterality: Left;  Marland Kitchen VAGINAL HYSTERECTOMY      Allergies  Allergen Reactions  . Tizanidine Other (See Comments)    HYPOTENSION AND UNRESPONSIVENESS  . Ace Inhibitors Cough  . Latex Hives  . Lisinopril Cough  . Tetracycline Other (See Comments)     REACTION: yeast  . Zanaflex [Tizanidine Hcl] Other (See Comments)    "drowsy"    Outpatient Encounter Medications as of 09/11/2017  Medication Sig  . acetaminophen (TYLENOL) 325 MG tablet Take 650 mg by mouth every 6 (six) hours as needed. for pain/ increased temp. May be administered orally, per G-tube if needed or rectally if unable to swallow (separate order). Maximum dose for 24 hours is 3,000 mg from all sources of Acetaminophen/ Tylenol  . acetylcysteine (MUCOMYST) 10 % nebulizer solution Take 10 mLs by nebulization daily. Give after AM Duoneb for assistance with mucus expectoration  . alum & mag hydroxide-simeth (MAALOX PLUS) 400-400-40 MG/5ML suspension Take 30 mLs by mouth every 4 (four) hours as needed for indigestion.   . Artificial Saliva (BIOTENE MOISTURIZING MOUTH MT) Use as  directed 2 sprays in the mouth or throat 4 (four) times daily as needed.  . Calcium Carbonate-Vitamin D (CALCIUM 600+D) 600-400 MG-UNIT tablet Take 1 tablet by mouth 2 (two) times daily.  . carvedilol (COREG) 3.125 MG tablet Take 3.125 mg by mouth 2 (two) times daily.   . cetirizine (ZYRTEC) 5 MG tablet Take 5 mg by mouth at bedtime.  . cyanocobalamin 1000 MCG tablet Take 1,000 mcg by mouth daily.  Marland Kitchen demeclocycline (DECLOMYCIN)  300 MG tablet Take 300 mg by mouth 2 (two) times daily.  Marland Kitchen dextromethorphan (DELSYM) 30 MG/5ML liquid Take 10 mLs by mouth every 12 (twelve) hours as needed for cough.  . dipyridamole-aspirin (AGGRENOX) 200-25 MG 12hr capsule Take 1 capsule by mouth every 12 (twelve) hours.   . divalproex (DEPAKOTE SPRINKLE) 125 MG capsule Take 250 mg by mouth 2 (two) times daily. 2 caps  . fluticasone furoate-vilanterol (BREO ELLIPTA) 100-25 MCG/INH AEPB Inhale 1 puff into the lungs daily.  Marland Kitchen gabapentin (NEURONTIN) 400 MG capsule Take 400 mg by mouth 3 (three) times daily.  Marland Kitchen guaiFENesin (MUCINEX) 600 MG 12 hr tablet Take 1,200 mg by mouth every 12 (twelve) hours.   Marland Kitchen ipratropium (ATROVENT) 0.03 % nasal spray Place 2 sprays into both nostrils at bedtime.   Marland Kitchen ipratropium-albuterol (DUONEB) 0.5-2.5 (3) MG/3ML SOLN Take 3 mLs by nebulization 3 (three) times daily as needed. Also take 3 times a day  . levothyroxine (SYNTHROID, LEVOTHROID) 25 MCG tablet Take 25 mcg by mouth daily before breakfast.   . magnesium hydroxide (MILK OF MAGNESIA) 400 MG/5ML suspension Take 30 mLs by mouth every 4 (four) hours as needed for mild constipation.  . Menthol (HALLS COUGH DROPS MT) Take 3.2 mg by mouth as needed  . Menthol, Topical Analgesic, 4 % GEL Apply 1 application topically 2 (two) times daily. Apply to bilateral lower extremities  . methocarbamol (ROBAXIN) 500 MG tablet Take 500 mg by mouth every 6 (six) hours as needed for muscle spasms.  . montelukast (SINGULAIR) 10 MG tablet Take 10 mg by mouth at bedtime.   Marland Kitchen omeprazole (PRILOSEC) 20 MG capsule Take 20 mg by mouth 2 (two) times daily.   Marland Kitchen oxybutynin (DITROPAN) 5 MG tablet Take 5 mg by mouth 2 (two) times daily.   . OXYGEN Inhale 2 L into the lungs every 4 (four) hours as needed. for dyspnea, Check O2 saturations prior to placing on patient and at least every 4 hours after applying. Notify MD if oxygen saturations drop below baseline while receiving oxygen or no improvement  in dyspnea  . Polyethyl Glyc-Propyl Glyc PF (SYSTANE PRESERVATIVE FREE) 0.4-0.3 % SOLN Place 2 drops into both eyes 2 (two) times daily.  . polyethylene glycol (MIRALAX / GLYCOLAX) packet Take 17 g by mouth daily. dx: constipation  Hold if loose stool  . prazosin (MINIPRESS) 1 MG capsule Take 3 mg by mouth at bedtime. 3 capsules  . Salicylic Acid 40 % PADS Apply 1 corn pad to Right 5th toe daily until corn resolved  . sennosides-docusate sodium (SENOKOT-S) 8.6-50 MG tablet Take 1 tablet by mouth 2 (two) times daily as needed for constipation. Also take 1 tab by mouth at bedtime daily.  . Skin Protectants, Misc. (ENDIT EX) Apply liberal amount topically to areas of skin irritation as needed. Ok to leave at bedside.  . sodium chloride 1 g tablet Take 1 g by mouth daily.  Marland Kitchen spironolactone (ALDACTONE) 50 MG tablet Take 50 mg by mouth daily.  Marland Kitchen torsemide (DEMADEX)  20 MG tablet Take 20 mg by mouth daily.  Marland Kitchen triamcinolone (NASACORT ALLERGY 24HR) 55 MCG/ACT AERO nasal inhaler Place 1 spray into the nose daily.  Marland Kitchen UNABLE TO FIND Diet Type: Regular. No Sodium Restriction. Gatorade on lunch and supper trays for hyponatremia  . venlafaxine XR (EFFEXOR-XR) 75 MG 24 hr capsule Take 75 mg by mouth daily with breakfast.  . [DISCONTINUED] acetaminophen (TYLENOL) 500 MG tablet Take 1,000 mg by mouth every 8 (eight) hours as needed for mild pain. Maximum of 3000 mg of acetaminophen in 24 hours , consider all sources  to both as needed, plain  Apap, and as needed oxydocone apap.  . [DISCONTINUED] Calcium Carbonate-Vitamin D (CALCIUM 600+D) 600-400 MG-UNIT tablet Take 1 tablet by mouth 2 (two) times daily.   . [DISCONTINUED] diclofenac sodium (VOLTAREN) 1 % GEL Apply 4 g topically 2 (two) times daily between meals. Apply to both knees for pain  . [DISCONTINUED] sennosides-docusate sodium (SENOKOT-S) 8.6-50 MG tablet Take 1 tablet by mouth every 3 (three) days.    No facility-administered encounter medications on file  as of 09/11/2017.     Review of Systems  GENERAL: No change in appetite, no fatigue, no weight changes, no fever, chills or weakness MOUTH and THROAT: Denies oral discomfort, gingival pain or bleeding, pain from teeth or hoarseness   RESPIRATORY: no cough, SOB, DOE, wheezing, hemoptysis CARDIAC: No chest pain, edema or palpitations GI: No abdominal pain, diarrhea, constipation, heart burn, nausea or vomiting PSYCHIATRIC: Denies feelings of depression or anxiety. No report of hallucinations, insomnia, paranoia, or agitation    Immunization History  Administered Date(s) Administered  . Influenza Split 09/30/2014  . Influenza Whole 01/08/2004, 10/08/2006, 11/22/2008  . Influenza-Unspecified 08/13/2013, 09/21/2015, 09/25/2016  . PPD Test 01/08/2016  . Pneumococcal Conjugate-13 08/13/2013  . Pneumococcal Polysaccharide-23 10/24/2000, 11/30/2014  . Td 01/07/2001  . Zoster 01/07/2006   Pertinent  Health Maintenance Due  Topic Date Due  . INFLUENZA VACCINE  08/07/2017  . DEXA SCAN  Completed  . PNA vac Low Risk Adult  Completed   No flowsheet data found. Functional Status Survey:    Vitals:   09/11/17 1113  BP: (!) 106/52  Pulse: 79  Resp: 18  Temp: 97.9 F (36.6 C)  TempSrc: Oral  SpO2: 95%  Weight: 184 lb 8 oz (83.7 kg)  Height: 5\' 1"  (1.549 m)   Body mass index is 34.86 kg/m.  Physical Exam  GENERAL APPEARANCE: Well nourished. In no acute distress.Obese SKIN:  Skin is warm and dry.  MOUTH and THROAT: Lips are without lesions. Oral mucosa is moist and without lesions.  RESPIRATORY: Breathing is even & unlabored, BS CTAB CARDIAC: RRR, no murmur,no extra heart sounds, no edema GI: Abdomen soft, normal BS, no masses, no tenderness EXTREMITIES:  LUE not moving, left hand contracted and has splint; Left foot has AFO NEUROLOGICAL: There is no tremor. Speech is clear PSYCHIATRIC: Alert to self, disoriented to time and place. Affect and behavior are appropriate   Labs  reviewed: Recent Labs    04/24/17 0525 08/18/17 1356 08/21/17 0620  NA 134* 128* 133*  K 4.0 3.7 3.6  CL 96* 90* 97*  CO2 31 28 28   GLUCOSE 96 94 96  BUN 27* 13 15  CREATININE 0.97 1.01* 1.24*  CALCIUM 9.2 9.1 8.7*  MG 1.8  --   --    Recent Labs    04/24/17 0525 08/18/17 1356 08/21/17 0620  AST 16 33 14*  ALT 10* 34 17  ALKPHOS 49 229* 163*  BILITOT 0.5 0.7 0.6  PROT 5.8* 6.1* 5.7*  ALBUMIN 3.3* 2.4* 2.1*   Recent Labs    04/24/17 0525 08/18/17 1356 08/21/17 0620  WBC 5.8 11.8* 8.3  NEUTROABS 3.3 9.1* 5.8  HGB 11.4* 10.0* 9.7*  HCT 33.7* 29.4* 28.5*  MCV 95.2 92.2 92.5  PLT 196 278 343   Lab Results  Component Value Date   TSH 5.209 (H) 06/05/2017   Lab Results  Component Value Date   HGBA1C 5.6 06/23/2009   Lab Results  Component Value Date   CHOL 177 04/24/2017   HDL 39 (L) 04/24/2017   LDLCALC 121 (H) 04/24/2017   LDLDIRECT 161.4 12/14/2008   TRIG 86 04/24/2017   CHOLHDL 4.5 04/24/2017     Assessment/Plan  1. History of CVA (cerebrovascular accident) - stable continue Aggrenox  ER 12 hour 5-200 mg 1 capsule every 12 hours, Carvedilol 3.125 mg 1 tablet twice a day, Torsemide 20 mg 1 tab daily Aldactone 50 mg 1 tablet daily    2. SIADH (syndrome of inappropriate ADH production) (HCC) - continue sodium chloride 1 g daily and Demeclocycline 300 mg 1 tab twice a day   3. Uncomplicated asthma, unspecified asthma severity, unspecified whether persistent - continue ipratropium-albuterol ebulization 3 times a day, ipratropium bromide2 sprays in each nostril daily at bedtime, cetirizine 5 mg 1 tab daily at bedtime, Breo Ellipta 1 Inhalation daily   4. Posttraumatic stress disorder - table, continue prazosin 1 mg  3 capsules =  3 Mg daily at bedtime   5. Major depressive disorder with single episode, remission status unspecified - mood is stable, continue venlafaxine ER 24 hours 75 mg 1 capsule daily   6. Hypothyroidism due to acquired atrophy of  thyroid - continue levothyroxine 25 g 1 tab daily Lab Results  Component Value Date   TSH 5.209 (H) 06/05/2017    7. Gastroesophageal reflux disease without esophagitis - table, continue omeprazole DR 20 mg 1 capsule twice a day   8. Essential hypertension, benign - well controlled, continue Carvedilol 3.125 mg 1 tablet twice a day, Torsemide 20 mg 1 tab daily Aldactone 50 mg 1 tablet daily    9. Mood disorder (HCC) - mood this is stable, continue depakote sprinkles 125 mg 2 capsules = 50 mg twice a day    Family/ staff Communication: Discussed plan of care with resident.  Labs/tests ordered:  None  Goals of care:  Long term care   Durenda Age, NP [facility] [phone] (Monday-Friday 8:00 a.m. - 5:00 p.m.) [phone] (after hours)

## 2017-09-16 ENCOUNTER — Ambulatory Visit
Admission: RE | Admit: 2017-09-16 | Discharge: 2017-09-16 | Disposition: A | Discharge status: Home / Self Care | Payer: PPO | Source: Ambulatory Visit | Attending: Gastroenterology | Admitting: Gastroenterology

## 2017-09-16 DIAGNOSIS — R1011 Right upper quadrant pain: Secondary | ICD-10-CM | POA: Diagnosis not present

## 2017-09-16 MED ORDER — TECHNETIUM TC 99M MEBROFENIN IV KIT
5.1700 | PACK | Freq: Once | INTRAVENOUS | Status: AC | PRN
  Administered 2017-09-16: 5.17 via INTRAVENOUS

## 2017-10-07 ENCOUNTER — Encounter
Admission: RE | Admit: 2017-10-07 | Discharge: 2017-10-07 | Disposition: A | Discharge status: Home / Self Care | Payer: PPO | Source: Ambulatory Visit | Attending: Internal Medicine | Admitting: Internal Medicine

## 2017-10-10 ENCOUNTER — Encounter: Payer: Self-pay | Admitting: Adult Health

## 2017-10-10 ENCOUNTER — Non-Acute Institutional Stay (SKILLED_NURSING_FACILITY): Payer: PPO | Admitting: Adult Health

## 2017-10-10 DIAGNOSIS — E871 Hypo-osmolality and hyponatremia: Secondary | ICD-10-CM | POA: Diagnosis not present

## 2017-10-10 DIAGNOSIS — N183 Chronic kidney disease, stage 3 unspecified: Secondary | ICD-10-CM

## 2017-10-10 DIAGNOSIS — J45909 Unspecified asthma, uncomplicated: Secondary | ICD-10-CM

## 2017-10-10 DIAGNOSIS — E222 Syndrome of inappropriate secretion of antidiuretic hormone: Secondary | ICD-10-CM

## 2017-10-10 DIAGNOSIS — F4310 Post-traumatic stress disorder, unspecified: Secondary | ICD-10-CM | POA: Diagnosis not present

## 2017-10-10 DIAGNOSIS — I10 Essential (primary) hypertension: Secondary | ICD-10-CM

## 2017-10-10 DIAGNOSIS — Z8673 Personal history of transient ischemic attack (TIA), and cerebral infarction without residual deficits: Secondary | ICD-10-CM

## 2017-10-10 DIAGNOSIS — N3281 Overactive bladder: Secondary | ICD-10-CM

## 2017-10-10 DIAGNOSIS — G629 Polyneuropathy, unspecified: Secondary | ICD-10-CM

## 2017-10-10 DIAGNOSIS — E039 Hypothyroidism, unspecified: Secondary | ICD-10-CM

## 2017-10-10 NOTE — Progress Notes (Signed)
Location:  [facility] Room Number: 734-L Place of Service:  SNF ([phone]) Provider:  Durenda Age, NP  Patient Care Team: Kirk Ruths, MD as PCP - General (Internal Medicine)  Extended Emergency Contact Information Primary Emergency Contact: Norm Salt Address: [place]          [name] Phone: [phone] Mobile Phone: [phone] Relation: Spouse Secondary Emergency Contact: Barry Dienes, [place] [address] [place] of [place] Phone: [phone] Mobile Phone: [phone] Relation: Daughter  Code Status:  Full Code  Goals of care: Advanced Directive information Advanced Directives 09/11/2017  Does Patient Have a Medical Advance Directive? No  Type of Advance Directive -  Does patient want to make changes to medical advance directive? -  Copy of [place] in Chart? -  Would patient like information on creating a medical advance directive? No - Patient declined     Chief Complaint  Patient presents with  . Medical Management of Chronic Issues    Routine [facility] visit    HPI:  Pt is a 78 y.o. female seen today for medical management of chronic diseases.  She is a long-term care resident of [facility]  She has a PMH of CVA with left-sided hemiparesis, hyperlipidemia, hypertension, and OA. She was seen in her room today. Lab review showed creatinine up to 1.24. She is currently taking Torsemide 20 mg for hypertension.   Past Medical History:  Diagnosis Date  . Arthritis   . Asthma   . 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 Depression with anxiety   . GERD (gastroesophageal reflux disease)   . Hemiparesis affecting left side as late effect of cerebrovascular accident (CVA) ([place])   . Hot flashes   . Hyperlipidemia   . Hypertension   . Muscle hypertonicity   . Osteoarthritis   . Stroke ([place])   . TIA (transient  ischemic attack)    Past Surgical History:  Procedure Laterality Date  . ACHILLES TENDON LENGTHENING  08/25/2015   Procedure: ACHILLES TENDON LENGTHENING;  Surgeon: Samara Deist, DPM;  Location: [facility] ORS;  Service: Podiatry;;  . APPENDECTOMY    . COLONOSCOPY  06/17/2008  . EYE SURGERY Bilateral    Cataract Extraction with IOL  . FLAT FOOT RECONSTRUCTION-TAL GASTROC RECESSION Left 08/25/2015   Procedure: ENDOSCOPIC FLAT FOOT RECONSTRUCTION-TAL GASTROC RECESSION;  Surgeon: Samara Deist, DPM;  Location: [facility] ORS;  Service: Podiatry;  Laterality: Left;  Marland Kitchen VAGINAL HYSTERECTOMY      Allergies  Allergen Reactions  . Tizanidine Other (See Comments)    HYPOTENSION AND UNRESPONSIVENESS  . Ace Inhibitors Cough  . Latex Hives  . Lisinopril Cough  . Tetracycline Other (See Comments)     REACTION: yeast  . Zanaflex [Tizanidine Hcl] Other (See Comments)    "drowsy"    Outpatient Encounter Medications as of 10/10/2017  Medication Sig  . acetaminophen (TYLENOL) 325 MG tablet Take 650 mg by mouth every 6 (six) hours as needed. for pain/ increased temp. May be administered orally, per G-tube if needed or rectally if unable to swallow (separate order). Maximum dose for 24 hours is 3,000 mg from all sources of Acetaminophen/ Tylenol  . acetylcysteine (MUCOMYST) 10 % nebulizer solution Take 10 mLs by nebulization daily. Give after AM Duoneb for assistance with mucus expectoration  . alum & mag hydroxide-simeth (MAALOX PLUS) 400-400-40 MG/5ML suspension  Take 30 mLs by mouth every 4 (four) hours as needed for indigestion.   . Artificial Saliva (BIOTENE MOISTURIZING MOUTH MT) Use as directed 2 sprays in the mouth or throat 4 (four) times daily as needed.  . Calcium Carbonate-Vitamin D (CALCIUM 600+D) 600-400 MG-UNIT tablet Take 1 tablet by mouth 2 (two) times daily.  . carvedilol (COREG) 3.125 MG tablet Take 3.125 mg by mouth 2 (two) times daily.   . cetirizine (ZYRTEC) 5 MG tablet Take 5 mg by mouth at  bedtime.  . cyanocobalamin 1000 MCG tablet Take 1,000 mcg by mouth daily.  Marland Kitchen demeclocycline (DECLOMYCIN) 300 MG tablet Take 300 mg by mouth 2 (two) times daily.   Marland Kitchen dextromethorphan (DELSYM) 30 MG/5ML liquid Take 10 mLs by mouth every 12 (twelve) hours as needed for cough.  . dipyridamole-aspirin (AGGRENOX) 200-25 MG 12hr capsule Take 1 capsule by mouth every 12 (twelve) hours.   . divalproex (DEPAKOTE SPRINKLE) 125 MG capsule Take 250 mg by mouth 2 (two) times daily. 2 caps  . fluticasone furoate-vilanterol (BREO ELLIPTA) 100-25 MCG/INH AEPB Inhale 1 puff into the lungs daily.  Marland Kitchen gabapentin (NEURONTIN) 400 MG capsule Take 400 mg by mouth 3 (three) times daily.  Marland Kitchen guaiFENesin (MUCINEX) 600 MG 12 hr tablet Take 1,200 mg by mouth every 12 (twelve) hours.   Marland Kitchen ipratropium (ATROVENT) 0.03 % nasal spray Place 2 sprays into both nostrils at bedtime.   Marland Kitchen ipratropium-albuterol (DUONEB) 0.5-2.5 (3) MG/3ML SOLN Take 3 mLs by nebulization 3 (three) times daily as needed. Also take 3 times a day  . levothyroxine (SYNTHROID, LEVOTHROID) 25 MCG tablet Take 25 mcg by mouth daily before breakfast.   . magnesium hydroxide (MILK OF MAGNESIA) 400 MG/5ML suspension Take 30 mLs by mouth every 4 (four) hours as needed for mild constipation.  . Menthol (HALLS COUGH DROPS MT) Take 3.2 mg by mouth as needed   . Menthol, Topical Analgesic, 4 % GEL Apply 1 application topically 2 (two) times daily. Apply to bilateral lower extremities  . montelukast (SINGULAIR) 10 MG tablet Take 10 mg by mouth at bedtime.   Marland Kitchen nystatin cream (MYCOSTATIN) Apply 1 application topically 2 (two) times daily. Apply under bilateral breasts for redness  . omeprazole (PRILOSEC) 20 MG capsule Take 20 mg by mouth 2 (two) times daily.   Marland Kitchen oxybutynin (DITROPAN) 5 MG tablet Take 5 mg by mouth 2 (two) times daily.   . OXYGEN Inhale 2 L into the lungs every 4 (four) hours as needed. for dyspnea, Check O2 saturations prior to placing on patient and at least  every 4 hours after applying. Notify MD if oxygen saturations drop below baseline while receiving oxygen or no improvement in dyspnea  . Polyethyl Glyc-Propyl Glyc PF (SYSTANE PRESERVATIVE FREE) 0.4-0.3 % SOLN Place 2 drops into both eyes 2 (two) times daily.  . polyethylene glycol (MIRALAX / GLYCOLAX) packet Take 17 g by mouth daily. dx: constipation  Hold if loose stool  . prazosin (MINIPRESS) 1 MG capsule Take 3 mg by mouth at bedtime. 3 capsules  . Salicylic Acid 40 % PADS Apply 1 corn pad to Right 5th toe daily until corn resolved  . sennosides-docusate sodium (SENOKOT-S) 8.6-50 MG tablet Take 1 tablet by mouth 2 (two) times daily as needed for constipation. Also take 1 tab by mouth at bedtime daily.  . Skin Protectants, Misc. (ENDIT EX) Apply liberal amount topically to areas of skin irritation as needed. Ok to leave at bedside.  . sodium chloride 1 g  tablet Take 1 g by mouth daily.   Marland Kitchen spironolactone (ALDACTONE) 50 MG tablet Take 50 mg by mouth daily.  Marland Kitchen torsemide (DEMADEX) 20 MG tablet Take 20 mg by mouth daily.  Marland Kitchen triamcinolone (NASACORT ALLERGY 24HR) 55 MCG/ACT AERO nasal inhaler Place 1 spray into the nose daily.  Marland Kitchen UNABLE TO FIND Diet Type: Regular. No Sodium Restriction. Gatorade on lunch and supper trays for hyponatremia  . venlafaxine XR (EFFEXOR-XR) 75 MG 24 hr capsule Take 75 mg by mouth daily with breakfast.  . [DISCONTINUED] methocarbamol (ROBAXIN) 500 MG tablet Take 500 mg by mouth every 6 (six) hours as needed for muscle spasms.   No facility-administered encounter medications on file as of 10/10/2017.     Review of Systems  GENERAL: No change in appetite, no fatigue, no weight changes, no fever, chills or weakness MOUTH and THROAT: Denies oral discomfort, gingival pain or bleeding RESPIRATORY: no cough, SOB, DOE, wheezing, hemoptysis CARDIAC: No chest pain, edema or palpitations GI: No abdominal pain, diarrhea, constipation, heart burn, nausea or vomiting GU: Denies  dysuria, frequency, hematuria, or discharge PSYCHIATRIC: Denies feelings of depression or anxiety. No report of hallucinations, insomnia, paranoia, or agitation    Immunization History  Administered Date(s) Administered  . Influenza Split 09/30/2014  . Influenza Whole 01/08/2004, 10/08/2006, 11/22/2008  . Influenza-Unspecified 08/13/2013, 09/21/2015, 09/25/2016  . PPD Test 01/08/2016  . Pneumococcal Conjugate-13 08/13/2013  . Pneumococcal Polysaccharide-23 10/24/2000, 11/30/2014  . Td 01/07/2001  . Zoster 01/07/2006   Pertinent  Health Maintenance Due  Topic Date Due  . INFLUENZA VACCINE  08/07/2017  . DEXA SCAN  Completed  . PNA vac Low Risk Adult  Completed      Vitals:   10/10/17 0837  BP: 134/70  Pulse: 76  Resp: 18  Temp: (!) 97.3 F (36.3 C)  TempSrc: Oral  SpO2: 95%  Weight: 184 lb 8 oz (83.7 kg)  Height: 5\' 1"  (1.549 m)   Body mass index is 34.86 kg/m.  Physical Exam  GENERAL APPEARANCE: Well nourished. In no acute distress. Obese SKIN:  Skin is warm and dry.  MOUTH and THROAT: Lips are without lesions. Oral mucosa is moist and without lesions.  RESPIRATORY: Breathing is even & unlabored, BS CTAB CARDIAC: RRR, no murmur,no extra heart sounds, no edema GI: Abdomen soft, normal BS, no masses, no tenderness EXTREMITIES: Able to move BLE and RUE, LUE severely spastic, left hand with splint NEUROLOGICAL: There is no tremor. Left facial droop. Alert and oriented X 3. PSYCHIATRIC:  Affect and behavior are appropriate   Labs reviewed: Recent Labs    04/24/17 0525 08/18/17 1356 08/21/17 0620  NA 134* 128* 133*  K 4.0 3.7 3.6  CL 96* 90* 97*  CO2 31 28 28   GLUCOSE 96 94 96  BUN 27* 13 15  CREATININE 0.97 1.01* 1.24*  CALCIUM 9.2 9.1 8.7*  MG 1.8  --   --    Recent Labs    04/24/17 0525 08/18/17 1356 08/21/17 0620  AST 16 33 14*  ALT 10* 34 17  ALKPHOS 49 229* 163*  BILITOT 0.5 0.7 0.6  PROT 5.8* 6.1* 5.7*  ALBUMIN 3.3* 2.4* 2.1*   Recent  Labs    04/24/17 0525 08/18/17 1356 08/21/17 0620  WBC 5.8 11.8* 8.3  NEUTROABS 3.3 9.1* 5.8  HGB 11.4* 10.0* 9.7*  HCT 33.7* 29.4* 28.5*  MCV 95.2 92.2 92.5  PLT 196 278 343   Lab Results  Component Value Date   TSH 5.209 (H)  06/05/2017   Lab Results  Component Value Date   HGBA1C 5.6 06/23/2009   Lab Results  Component Value Date   CHOL 177 04/24/2017   HDL 39 (L) 04/24/2017   LDLCALC 121 (H) 04/24/2017   LDLDIRECT 161.4 12/14/2008   TRIG 86 04/24/2017   CHOLHDL 4.5 04/24/2017    Significant Diagnostic Results in last 30 days:  Nm Hepatobiliary Including Gb  Result Date: 09/16/2017 CLINICAL DATA:  History of cholelithiasis with right upper quadrant pain and nausea and vomiting EXAM: NUCLEAR MEDICINE HEPATOBILIARY IMAGING TECHNIQUE: Sequential images of the abdomen were obtained out to 60 minutes following intravenous administration of radiopharmaceutical. RADIOPHARMACEUTICALS:  5.17 mCi Tc-40m  Choletec IV COMPARISON:  11/28/2014. FINDINGS: Prompt uptake and biliary excretion of activity by the liver is seen. Gallbladder activity is visualized, consistent with patency of cystic duct. The distention of the gallbladder is limited due to the known cholelithiasis seen on prior CT examination. Biliary activity passes into small bowel, consistent with patent common bile duct. IMPRESSION: Normal uptake and excretion of biliary tracer. Partial filling of the gallbladder related to underlying cholelithiasis. Electronically Signed   By: Inez Catalina M.D.   On: 09/16/2017 11:38    Assessment/Plan  1. Chronic kidney disease, stage III (moderate) (HCC) -  Creatinine 1.24, GFR  47, will decrease torsemide from 20 mg daily to 10 mg daily, recheck BMP in 1 week   2. Overactive bladder -continue oxybutynin 5 mg 1 tab twice a day   3. Asthma, unspecified asthma severity, unspecified whether complicated, unspecified whether persistent -no SOB nor wheezing, continue montelukast 10 mg 1 tab  nightly, cetirizine 5 mg 1 tab nightly   4. History of CVA (cerebrovascular accident) -stable, continue Aggrenox ER 12-hour 25-200 mg 1 capsule every 12 hours   5. Neuropathy - stable, gabapentin 400 mg 1 capsule 3 times a day   6. SIADH (syndrome of inappropriate ADH production) (HCC) -continue demeclocycline 300 mg 1 tab twice a day   7. Essential hypertension, benign -well-controlled, continue Spironolactone 50 mg 1 tab daily   8. Chronic hyponatremia - continue sodium chloride 1 g 1 tab daily   9. PTSD (post-traumatic stress disorder) -mood is a stable, continue prazosin 1 mg 1 to 3 capsules = 3 mg nightly, Depakote sprinkles 125 mg 2 capsules = 250 mg twice a day   10.  Hypothyroidism - continue levothyroxine 25 mcg 1 tab daily, check tsh     Family/ staff Communication: Discussed plan of care with resident.  Labs/tests ordered: TSH and BMP in 1 week  Goals of care:   Long-term care.   Durenda Age, NP [facility] [phone] (Monday-Friday 8:00 a.m. - 5:00 p.m.) [phone] (after hours)

## 2017-10-14 DIAGNOSIS — M79602 Pain in left arm: Secondary | ICD-10-CM | POA: Diagnosis not present

## 2017-10-14 DIAGNOSIS — I69398 Other sequelae of cerebral infarction: Secondary | ICD-10-CM | POA: Diagnosis not present

## 2017-10-14 DIAGNOSIS — R262 Difficulty in walking, not elsewhere classified: Secondary | ICD-10-CM | POA: Diagnosis not present

## 2017-10-14 DIAGNOSIS — M79605 Pain in left leg: Secondary | ICD-10-CM | POA: Diagnosis not present

## 2017-10-14 DIAGNOSIS — M24542 Contracture, left hand: Secondary | ICD-10-CM | POA: Diagnosis not present

## 2017-10-14 DIAGNOSIS — I69354 Hemiplegia and hemiparesis following cerebral infarction affecting left non-dominant side: Secondary | ICD-10-CM | POA: Diagnosis not present

## 2017-10-14 DIAGNOSIS — M6281 Muscle weakness (generalized): Secondary | ICD-10-CM | POA: Diagnosis not present

## 2017-10-17 ENCOUNTER — Other Ambulatory Visit
Admission: RE | Admit: 2017-10-17 | Discharge: 2017-10-17 | Disposition: A | Discharge status: Home / Self Care | Payer: PPO | Source: Ambulatory Visit | Attending: Internal Medicine | Admitting: Internal Medicine

## 2017-10-17 DIAGNOSIS — R112 Nausea with vomiting, unspecified: Secondary | ICD-10-CM | POA: Diagnosis not present

## 2017-10-17 LAB — BASIC METABOLIC PANEL
Anion gap: 9 (ref 5–15)
BUN: 14 mg/dL (ref 8–23)
CHLORIDE: 98 mmol/L (ref 98–111)
CO2: 28 mmol/L (ref 22–32)
CREATININE: 0.77 mg/dL (ref 0.44–1.00)
Calcium: 9.2 mg/dL (ref 8.9–10.3)
GFR calc Af Amer: >60 mL/min (ref >60)
GFR calc non Af Amer: >60 mL/min (ref >60)
GLUCOSE: 82 mg/dL (ref 70–99)
Potassium: 4.3 mmol/L (ref 3.5–5.1)
SODIUM: 135 mmol/L (ref 135–145)

## 2017-10-17 LAB — TSH: TSH: 7.78 u[IU]/mL — ABNORMAL HIGH (ref 0.350–4.500)

## 2017-11-07 ENCOUNTER — Encounter
Admission: RE | Admit: 2017-11-07 | Discharge: 2017-11-07 | Disposition: A | Discharge status: Home / Self Care | Payer: PPO | Source: Ambulatory Visit | Attending: Internal Medicine | Admitting: Internal Medicine

## 2017-11-07 DIAGNOSIS — M79605 Pain in left leg: Secondary | ICD-10-CM | POA: Diagnosis not present

## 2017-11-07 DIAGNOSIS — M6281 Muscle weakness (generalized): Secondary | ICD-10-CM | POA: Diagnosis not present

## 2017-11-07 DIAGNOSIS — I69398 Other sequelae of cerebral infarction: Secondary | ICD-10-CM | POA: Diagnosis not present

## 2017-11-07 DIAGNOSIS — M79602 Pain in left arm: Secondary | ICD-10-CM | POA: Diagnosis not present

## 2017-11-07 DIAGNOSIS — I69354 Hemiplegia and hemiparesis following cerebral infarction affecting left non-dominant side: Secondary | ICD-10-CM | POA: Diagnosis not present

## 2017-11-07 DIAGNOSIS — R262 Difficulty in walking, not elsewhere classified: Secondary | ICD-10-CM | POA: Diagnosis not present

## 2017-11-07 DIAGNOSIS — M24542 Contracture, left hand: Secondary | ICD-10-CM | POA: Diagnosis not present

## 2017-11-11 ENCOUNTER — Non-Acute Institutional Stay (SKILLED_NURSING_FACILITY): Payer: PPO | Admitting: Adult Health

## 2017-11-11 ENCOUNTER — Encounter: Payer: Self-pay | Admitting: Adult Health

## 2017-11-11 DIAGNOSIS — J309 Allergic rhinitis, unspecified: Secondary | ICD-10-CM

## 2017-11-11 DIAGNOSIS — I6350 Cerebral infarction due to unspecified occlusion or stenosis of unspecified cerebral artery: Secondary | ICD-10-CM | POA: Diagnosis not present

## 2017-11-11 DIAGNOSIS — I10 Essential (primary) hypertension: Secondary | ICD-10-CM

## 2017-11-11 DIAGNOSIS — J45909 Unspecified asthma, uncomplicated: Secondary | ICD-10-CM

## 2017-11-11 NOTE — Progress Notes (Signed)
Location:   [facility] Room Number: [place] of Service:  SNF (31)   CODE STATUS: Full Code  Allergies  Allergen Reactions  . Tizanidine Other (See Comments)    HYPOTENSION AND UNRESPONSIVENESS  . Ace Inhibitors Cough  . Latex Hives  . Lisinopril Cough  . Tetracycline Other (See Comments)     REACTION: yeast  . Zanaflex [Tizanidine Hcl] Other (See Comments)    "drowsy"    Chief Complaint  Patient presents with  . Medical Management of Chronic Issues    Cerebral vascular occlusion with cerebral infarction; essential hypertension, benign; allergic rhinitis, unspecified seasonality unspecified trigger; uncomplicated asthma unspecified asthma severity unspecified whether persistent.     HPI:  She is a 77 year old long term resident of this facility being seen for the management of her chronic illnesses: cva; hypertension; allergic rhinitis; asthma. She denies any cough or sinus congestion; no uncontrolled pain; no changes in appetite.   Past Medical History:  Diagnosis Date  . Arthritis   . Asthma   . 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 Depression with anxiety   . GERD (gastroesophageal reflux disease)   . Hemiparesis affecting left side as late effect of cerebrovascular accident (CVA) ([place])   . Hot flashes   . Hyperlipidemia   . Hypertension   . Muscle hypertonicity   . Osteoarthritis   . Stroke ([place])   . TIA (transient ischemic attack)     Past Surgical History:  Procedure Laterality Date  . ACHILLES TENDON LENGTHENING  08/25/2015   Procedure: ACHILLES TENDON LENGTHENING;  Surgeon: Samara Deist, DPM;  Location: [facility] ORS;  Service: Podiatry;;  . APPENDECTOMY    . COLONOSCOPY  06/17/2008  . EYE SURGERY Bilateral    Cataract Extraction with IOL  . FLAT FOOT RECONSTRUCTION-TAL GASTROC RECESSION Left 08/25/2015   Procedure: ENDOSCOPIC FLAT FOOT RECONSTRUCTION-TAL GASTROC RECESSION;  Surgeon: Samara Deist, DPM;  Location:  [facility] ORS;  Service: Podiatry;  Laterality: Left;  Marland Kitchen VAGINAL HYSTERECTOMY      Social History   Socioeconomic History  . Marital status: Married    Spouse name: Not on file  . Number of children: Not on file  . Years of education: Not on file  . Highest education level: Not on file  Occupational History  . Occupation: retired  Scientific laboratory technician  . Financial resource strain: Not on file  . Food insecurity:    Worry: Not on file    Inability: Not on file  . Transportation needs:    Medical: Not on file    Non-medical: Not on file  Tobacco Use  . Smoking status: Former Smoker    Packs/day: 0.50    Years: 3.00    Pack years: 1.50    Types: Cigarettes    Last attempt to quit: 01/08/1963    Years since quitting: 54.8  . Smokeless tobacco: Never Used  Substance and Sexual Activity  . Alcohol use: No    Alcohol/week: 0.0 standard drinks    Comment: does not drink alcohol  . Drug use: No  . Sexual activity: Not on file  Lifestyle  . Physical activity:    Days per week: Not on file    Minutes per session: Not on file  . Stress: Not on file  Relationships  . Social connections:    Talks on phone: Not on file    Gets together: Not on file    Attends religious service:  Not on file    Active member of club or organization: Not on file    Attends meetings of clubs or organizations: Not on file    Relationship status: Not on file  . Intimate partner violence:    Fear of current or ex partner: Not on file    Emotionally abused: Not on file    Physically abused: Not on file    Forced sexual activity: Not on file  Other Topics Concern  . Not on file  Social History Narrative   Admitted to [facility] [place] 11/30/2014   Married   Former smoker   Full Code   Family History  Problem Relation Age of Onset  . Alcohol abuse Mother   . Coronary artery disease Mother   . Stroke Mother   . Liver cancer Father   . Alcohol abuse Brother   . Coronary artery disease Brother   .  Diabetes type II Brother   . Liver cancer Brother       VITAL SIGNS BP 123/78   Pulse 85   Temp 98.3 F (36.8 C)   Resp 13   Ht 5' 1"  (1.549 m)   Wt 184 lb 9.6 oz (83.7 kg)   SpO2 95%   BMI 34.88 kg/m   Outpatient Encounter Medications as of 11/11/2017  Medication Sig  . acetaminophen (TYLENOL) 325 MG tablet Take 650 mg by mouth every 6 (six) hours as needed. for pain/ increased temp. May be administered orally, per G-tube if needed or rectally if unable to swallow (separate order). Maximum dose for 24 hours is 3,000 mg from all sources of Acetaminophen/ Tylenol  . acetylcysteine (MUCOMYST) 10 % nebulizer solution Take 10 mLs by nebulization daily. Give after AM Duoneb for assistance with mucus expectoration  . alum & mag hydroxide-simeth (MAALOX PLUS) 400-400-40 MG/5ML suspension Take 30 mLs by mouth every 4 (four) hours as needed for indigestion.   . Artificial Saliva (BIOTENE MOISTURIZING MOUTH MT) Use as directed 2 sprays in the mouth or throat 4 (four) times daily as needed.  . Calcium Carbonate-Vitamin D (CALCIUM 600+D) 600-400 MG-UNIT tablet Take 1 tablet by mouth 2 (two) times daily.  . Carboxymeth-Glycerin-Polysorb (REFRESH OPTIVE ADVANCED) 0.5-1-0.5 % SOLN Place 2 drops into both eyes 2 (two) times daily.  . carvedilol (COREG) 3.125 MG tablet Take 3.125 mg by mouth 2 (two) times daily.   . cetirizine (ZYRTEC) 5 MG tablet Take 5 mg by mouth at bedtime.  . cyanocobalamin 1000 MCG tablet Take 1,000 mcg by mouth daily.  Marland Kitchen demeclocycline (DECLOMYCIN) 300 MG tablet Take 300 mg by mouth 2 (two) times daily.   Marland Kitchen dextromethorphan (DELSYM) 30 MG/5ML liquid Take 10 mLs by mouth every 12 (twelve) hours as needed for cough.  . dipyridamole-aspirin (AGGRENOX) 200-25 MG 12hr capsule Take 1 capsule by mouth every 12 (twelve) hours.   . divalproex (DEPAKOTE SPRINKLE) 125 MG capsule Take 250 mg by mouth 2 (two) times daily. 2 caps  . fluticasone furoate-vilanterol (BREO ELLIPTA) 100-25  MCG/INH AEPB Inhale 1 puff into the lungs daily.  Marland Kitchen gabapentin (NEURONTIN) 400 MG capsule Take 400 mg by mouth 3 (three) times daily.  Marland Kitchen guaiFENesin (MUCINEX) 600 MG 12 hr tablet Take 1,200 mg by mouth every 12 (twelve) hours.   Marland Kitchen ipratropium (ATROVENT) 0.03 % nasal spray Place 2 sprays into both nostrils at bedtime.   Marland Kitchen ipratropium-albuterol (DUONEB) 0.5-2.5 (3) MG/3ML SOLN Take 3 mLs by nebulization 3 (three) times daily as needed. Also take 3  times a day  . levothyroxine (SYNTHROID, LEVOTHROID) 25 MCG tablet Take 25 mcg by mouth daily before breakfast.   . Menthol (HALLS COUGH DROPS MT) Take 3.2 mg by mouth as needed   . Menthol, Topical Analgesic, 4 % GEL Apply 1 application topically 2 (two) times daily. Apply to bilateral lower extremities  . montelukast (SINGULAIR) 10 MG tablet Take 10 mg by mouth at bedtime.   Marland Kitchen omeprazole (PRILOSEC) 20 MG capsule Take 20 mg by mouth 2 (two) times daily.   Marland Kitchen oxybutynin (DITROPAN) 5 MG tablet Take 5 mg by mouth 2 (two) times daily.   . OXYGEN Inhale 2 L into the lungs every 4 (four) hours as needed. for dyspnea, Check O2 saturations prior to placing on patient and at least every 4 hours after applying. Notify MD if oxygen saturations drop below baseline while receiving oxygen or no improvement in dyspnea  . polyethylene glycol (MIRALAX / GLYCOLAX) packet Take 17 g by mouth daily. dx: constipation  Hold if loose stool  . prazosin (MINIPRESS) 1 MG capsule Take 3 mg by mouth at bedtime. 3 capsules  . Salicylic Acid 40 % PADS Apply 1 corn pad to Right 5th toe daily until corn resolved  . sennosides-docusate sodium (SENOKOT-S) 8.6-50 MG tablet Take 2 tablets by mouth 2 (two) times daily as needed for constipation. Also take 1 tab by mouth at bedtime daily.  . Skin Protectants, Misc. (ENDIT EX) Apply liberal amount topically to areas of skin irritation as needed. Ok to leave at bedside.  . sodium chloride 1 g tablet Take 1 g by mouth daily.   Marland Kitchen spironolactone  (ALDACTONE) 50 MG tablet Take 50 mg by mouth daily.  Marland Kitchen torsemide (DEMADEX) 20 MG tablet Take 20 mg by mouth daily.  Marland Kitchen triamcinolone (NASACORT ALLERGY 24HR) 55 MCG/ACT AERO nasal inhaler Place 1 spray into the nose daily.  Marland Kitchen UNABLE TO FIND Diet Type: Regular. No Sodium Restriction. Gatorade on lunch and supper trays for hyponatremia  . venlafaxine XR (EFFEXOR-XR) 75 MG 24 hr capsule Take 75 mg by mouth daily with breakfast.  . magnesium hydroxide (MILK OF MAGNESIA) 400 MG/5ML suspension Take 30 mLs by mouth every 4 (four) hours as needed for mild constipation.  Marland Kitchen nystatin cream (MYCOSTATIN) Apply 1 application topically 2 (two) times daily. Apply under bilateral breasts for redness  . [DISCONTINUED] Polyethyl Glyc-Propyl Glyc PF (SYSTANE PRESERVATIVE FREE) 0.4-0.3 % SOLN Place 2 drops into both eyes 2 (two) times daily.   No facility-administered encounter medications on file as of 11/11/2017.      SIGNIFICANT DIAGNOSTIC EXAMS  LABS REVIEWED: PREVIOUS   04-24-17: wbc 5.8; hgb 11.4; hct 33.7; mcv 95.2; plt 196 glucose 96; bun 27; creat 0.97; k+ 4.0; na++ 134; ca 9.2; liver normal albumin 3.3 chol 177; ldl 121; trig 86 hdl 39; mag 1.8 tsh 7.731;  Vit B 12 329; vit D 41.0;  04-25-17: depakote free 7.3  06-05-17: tsh 5.209; depakote free 5.9 08-21-17: wbc 11.8; hgb 10.0; hct 29.4; mcv 92.2; plt 278; glucose 94; bun 13; creat 1.01; k+ 3.7; na++ 128; ca 9.1; alk phos 229; albumin 2.4   TODAY:   10-17-17: tsh 7.780   Review of Systems  Constitutional: Negative for malaise/fatigue.  Respiratory: Negative for cough and shortness of breath.   Cardiovascular: Negative for chest pain, palpitations and leg swelling.  Gastrointestinal: Negative for abdominal pain, constipation and heartburn.  Musculoskeletal: Negative for back pain, joint pain and myalgias.  Skin: Negative.   Neurological:  Negative for dizziness.  Psychiatric/Behavioral: The patient is not nervous/anxious.    Physical Exam    Constitutional: She appears well-developed and well-nourished. No distress.  Neck: No thyromegaly present.  Cardiovascular: Normal rate, regular rhythm, normal heart sounds and intact distal pulses.  Pulmonary/Chest: Effort normal and breath sounds normal. No respiratory distress.  Abdominal: Soft. Bowel sounds are normal. She exhibits no distension. There is no tenderness.  Musculoskeletal: She exhibits no edema.  Is able to move all extremities AFO left foot Left hand splint     Lymphadenopathy:    She has no cervical adenopathy.  Neurological: She is alert.  Skin: Skin is warm and dry. She is not diaphoretic.  Psychiatric: She has a normal mood and affect.    ASSESSMENT/ PLAN:  TODAY:   1.  Cerebral artery occlusion with cerebral infarction: is neurologically stable; will continue aggrenox twice daily   2. Essential hypertension, benign: is stable b/p 123/78: will continue coreg 3.125 mg twice daily minipress 3 mg nightly; aldactone 50 mg daily   3. Allergic rhinitis: is stable will continue zyrtec 5 mg daily singulair 10 mg daily nasocort daily   4. Uncomplicated asthma: is stable will continue mucomyst neb daily mucinex 1200 mg twice atrovent nasal spray nightly breo daily; duoneb three times daily and three times daily as needed   PREVIOUS   5. Gastroesophageal reflux disease without esophagitis: stable prilosec 20 mg twice  daily   6. Hypothyroidism due to acquired atrophy of thyroid: stable tsh 7.780 will continue synthroid 50 mcg daily lab work pending.   7.  Overactive bladder: is stable will continue ditropan 5 mg twice daily   8.  Major depression, single episode: stable will continue effexor xr 75 mg daily depakote 250 mg twice daily stabilize mood   9. Chronic constipation: stable will continue senna s nightly and 2 tabs twice daily as needed   10. Bilateral lower extremity edema: is stable will continue demadex 20 mg daily   11. Bilateral chronic knee pain:  stable will monitor  12. SIADH: is stable will continue declomycin 300 mg twice daily       MD is aware of resident's narcotic use and is in agreement with current plan of care. We will attempt to wean resident as apropriate   Ok Edwards NP [facility]  Contact [phone] Monday through Friday 8am- 5pm  After hours call [phone]

## 2017-11-12 ENCOUNTER — Other Ambulatory Visit
Admission: RE | Admit: 2017-11-12 | Discharge: 2017-11-12 | Disposition: A | Discharge status: Home / Self Care | Payer: PPO | Source: Ambulatory Visit | Attending: Adult Health | Admitting: Adult Health

## 2017-11-12 DIAGNOSIS — F325 Major depressive disorder, single episode, in full remission: Secondary | ICD-10-CM | POA: Diagnosis not present

## 2017-11-12 LAB — VALPROIC ACID LEVEL: VALPROIC ACID LVL: 47 ug/mL — AB (ref 50.0–100.0)

## 2017-11-25 ENCOUNTER — Encounter: Payer: Self-pay | Admitting: Adult Health

## 2017-11-25 ENCOUNTER — Non-Acute Institutional Stay (SKILLED_NURSING_FACILITY): Payer: PPO | Admitting: Adult Health

## 2017-11-25 DIAGNOSIS — J209 Acute bronchitis, unspecified: Secondary | ICD-10-CM | POA: Diagnosis not present

## 2017-11-25 NOTE — Progress Notes (Signed)
[facility]:   [facility] Room Number: [place] of Service:  SNF (31)   CODE STATUS: Full Code  Allergies  Allergen Reactions  . Tizanidine Other (See Comments)    HYPOTENSION AND UNRESPONSIVENESS  . Ace Inhibitors Cough  . Latex Hives  . Lisinopril Cough  . Tetracycline Other (See Comments)     REACTION: yeast  . Zanaflex [Tizanidine Hcl] Other (See Comments)    "drowsy"    Chief Complaint  Patient presents with  . Acute Visit    URI Symptoms    HPI:  For the past couple of days she has had a cough without sputum production. She has a sore throat and is hoarse. There are no reports of fevers present.   Past Medical History:  Diagnosis Date  . Arthritis   . Asthma   . 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 Depression with anxiety   . GERD (gastroesophageal reflux disease)   . Hemiparesis affecting left side as late effect of cerebrovascular accident (CVA) ([place])   . Hot flashes   . Hyperlipidemia   . Hypertension   . Muscle hypertonicity   . Osteoarthritis   . Stroke ([place])   . TIA (transient ischemic attack)     Past Surgical History:  Procedure Laterality Date  . ACHILLES TENDON LENGTHENING  08/25/2015   Procedure: ACHILLES TENDON LENGTHENING;  Surgeon: Samara Deist, DPM;  Location: [facility] ORS;  Service: Podiatry;;  . APPENDECTOMY    . COLONOSCOPY  06/17/2008  . EYE SURGERY Bilateral    Cataract Extraction with IOL  . FLAT FOOT RECONSTRUCTION-TAL GASTROC RECESSION Left 08/25/2015   Procedure: ENDOSCOPIC FLAT FOOT RECONSTRUCTION-TAL GASTROC RECESSION;  Surgeon: Samara Deist, DPM;  Location: [facility] ORS;  Service: Podiatry;  Laterality: Left;  Marland Kitchen VAGINAL HYSTERECTOMY      Social History   Socioeconomic History  . Marital status: Married    Spouse name: Not on file  . Number of children: Not on file  . Years of education: Not on file  . Highest education level: Not on file  Occupational History  . Occupation: retired    Scientific laboratory technician  . Financial resource strain: Not on file  . Food insecurity:    Worry: Not on file    Inability: Not on file  . Transportation needs:    Medical: Not on file    Non-medical: Not on file  Tobacco Use  . Smoking status: Former Smoker    Packs/day: 0.50    Years: 3.00    Pack years: 1.50    Types: Cigarettes    Last attempt to quit: 01/08/1963    Years since quitting: 54.9  . Smokeless tobacco: Never Used  Substance and Sexual Activity  . Alcohol use: No    Alcohol/week: 0.0 standard drinks    Comment: does not drink alcohol  . Drug use: No  . Sexual activity: Not on file  Lifestyle  . Physical activity:    Days per week: Not on file    Minutes per session: Not on file  . Stress: Not on file  Relationships  . Social connections:    Talks on phone: Not on file    Gets together: Not on file    Attends religious service: Not on file    Active member of club or organization: Not on file    Attends meetings of clubs or organizations: Not on file    Relationship status: Not on file  .  Intimate partner violence:    Fear of current or ex partner: Not on file    Emotionally abused: Not on file    Physically abused: Not on file    Forced sexual activity: Not on file  Other Topics Concern  . Not on file  Social History Narrative   Admitted to [facility] [place] 11/30/2014   Married   Former smoker   Full Code   Family History  Problem Relation Age of Onset  . Alcohol abuse Mother   . Coronary artery disease Mother   . Stroke Mother   . Liver cancer Father   . Alcohol abuse Brother   . Coronary artery disease Brother   . Diabetes type II Brother   . Liver cancer Brother       VITAL SIGNS BP (!) 120/57   Pulse 90   Temp 98.4 F (36.9 C)   Resp 14   Ht 5' 1"  (1.549 m)   Wt 184 lb 9.6 oz (83.7 kg)   SpO2 93%   BMI 34.88 kg/m   Outpatient Encounter Medications as of 11/25/2017  Medication Sig  . acetaminophen (TYLENOL) 325 MG tablet Take 650  mg by mouth every 6 (six) hours as needed. for pain/ increased temp. May be administered orally, per G-tube if needed or rectally if unable to swallow (separate order). Maximum dose for 24 hours is 3,000 mg from all sources of Acetaminophen/ Tylenol  . acetylcysteine (MUCOMYST) 10 % nebulizer solution Take 10 mLs by nebulization daily. Give after AM Duoneb for assistance with mucus expectoration  . alum & mag hydroxide-simeth (MAALOX PLUS) 400-400-40 MG/5ML suspension Take 30 mLs by mouth every 4 (four) hours as needed for indigestion.   . Artificial Saliva (BIOTENE MOISTURIZING MOUTH MT) Use as directed 2 sprays in the mouth or throat 4 (four) times daily as needed.  . Calcium Carbonate-Vitamin D (CALCIUM 600+D) 600-400 MG-UNIT tablet Take 1 tablet by mouth 2 (two) times daily.  . Carboxymeth-Glycerin-Polysorb (REFRESH OPTIVE ADVANCED) 0.5-1-0.5 % SOLN Place 2 drops into both eyes 2 (two) times daily.  . carvedilol (COREG) 3.125 MG tablet Take 3.125 mg by mouth 2 (two) times daily.   . cetirizine (ZYRTEC) 5 MG tablet Take 5 mg by mouth at bedtime.  . cyanocobalamin 1000 MCG tablet Take 1,000 mcg by mouth daily.  Marland Kitchen demeclocycline (DECLOMYCIN) 300 MG tablet Take 300 mg by mouth 2 (two) times daily.   Marland Kitchen dextromethorphan (DELSYM) 30 MG/5ML liquid Take 10 mLs by mouth every 12 (twelve) hours as needed for cough.  . dipyridamole-aspirin (AGGRENOX) 200-25 MG 12hr capsule Take 1 capsule by mouth every 12 (twelve) hours.   . divalproex (DEPAKOTE SPRINKLE) 125 MG capsule Take 250 mg by mouth 2 (two) times daily. 2 caps  . fluticasone furoate-vilanterol (BREO ELLIPTA) 100-25 MCG/INH AEPB Inhale 1 puff into the lungs daily.  Marland Kitchen gabapentin (NEURONTIN) 400 MG capsule Take 400 mg by mouth 3 (three) times daily.  Marland Kitchen guaiFENesin (MUCINEX) 600 MG 12 hr tablet Take 1,200 mg by mouth every 12 (twelve) hours.   Marland Kitchen ipratropium (ATROVENT) 0.03 % nasal spray Place 2 sprays into both nostrils at bedtime.   Marland Kitchen  ipratropium-albuterol (DUONEB) 0.5-2.5 (3) MG/3ML SOLN Take 3 mLs by nebulization 3 (three) times daily as needed. Also take 3 times a day  . levothyroxine (SYNTHROID, LEVOTHROID) 50 MCG tablet Take 50 mcg by mouth daily before breakfast.   . magnesium hydroxide (MILK OF MAGNESIA) 400 MG/5ML suspension Take 30 mLs by mouth every  4 (four) hours as needed for mild constipation.  . Menthol (HALLS COUGH DROPS MT) Take 3.2 mg by mouth as needed   . Menthol, Topical Analgesic, 4 % GEL Apply 1 application topically 2 (two) times daily. Apply to bilateral lower extremities  . montelukast (SINGULAIR) 10 MG tablet Take 10 mg by mouth at bedtime.   Marland Kitchen omeprazole (PRILOSEC) 20 MG capsule Take 20 mg by mouth 2 (two) times daily.   Marland Kitchen oxybutynin (DITROPAN) 5 MG tablet Take 5 mg by mouth 2 (two) times daily.   . OXYGEN Inhale 2 L into the lungs every 4 (four) hours as needed. for dyspnea, Check O2 saturations prior to placing on patient and at least every 4 hours after applying. Notify MD if oxygen saturations drop below baseline while receiving oxygen or no improvement in dyspnea  . polyethylene glycol (MIRALAX / GLYCOLAX) packet Take 17 g by mouth daily. dx: constipation  Hold if loose stool  . prazosin (MINIPRESS) 1 MG capsule Take 3 mg by mouth at bedtime. 3 capsules  . Salicylic Acid 40 % PADS Apply 1 corn pad to Right 5th toe daily until corn resolved  . sennosides-docusate sodium (SENOKOT-S) 8.6-50 MG tablet Take 2 tablets by mouth 2 (two) times daily as needed for constipation. Also take 1 tab by mouth at bedtime daily.  . Skin Protectants, Misc. (ENDIT EX) Apply liberal amount topically to areas of skin irritation as needed. Ok to leave at bedside.  . sodium chloride 1 g tablet Take 1 g by mouth daily.   Marland Kitchen spironolactone (ALDACTONE) 50 MG tablet Take 50 mg by mouth daily.  Marland Kitchen torsemide (DEMADEX) 10 MG tablet Take 10 mg by mouth daily.   Marland Kitchen triamcinolone (NASACORT ALLERGY 24HR) 55 MCG/ACT AERO nasal inhaler  Place 1 spray into the nose daily.  Marland Kitchen UNABLE TO FIND Diet Type: Regular. No Sodium Restriction. Gatorade on lunch and supper trays for hyponatremia  . venlafaxine XR (EFFEXOR-XR) 75 MG 24 hr capsule Take 75 mg by mouth daily with breakfast.  . [DISCONTINUED] nystatin cream (MYCOSTATIN) Apply 1 application topically 2 (two) times daily. Apply under bilateral breasts for redness   No facility-administered encounter medications on file as of 11/25/2017.      SIGNIFICANT DIAGNOSTIC EXAMS  LABS REVIEWED: PREVIOUS   04-24-17: wbc 5.8; hgb 11.4; hct 33.7; mcv 95.2; plt 196 glucose 96; bun 27; creat 0.97; k+ 4.0; na++ 134; ca 9.2; liver normal albumin 3.3 chol 177; ldl 121; trig 86 hdl 39; mag 1.8 tsh 7.731;  Vit B 12 329; vit D 41.0;  04-25-17: depakote free 7.3  06-05-17: tsh 5.209; depakote free 5.9 08-21-17: wbc 11.8; hgb 10.0; hct 29.4; mcv 92.2; plt 278; glucose 94; bun 13; creat 1.01; k+ 3.7; na++ 128; ca 9.1; alk phos 229; albumin 2.4  10-17-17: tsh 7.780  NO NEW LABS.    Review of Systems  Constitutional: Negative for malaise/fatigue.  HENT: Positive for sore throat.        Is hoarse   Respiratory: Positive for cough and shortness of breath.   Cardiovascular: Negative for chest pain, palpitations and leg swelling.  Gastrointestinal: Negative for abdominal pain, constipation and heartburn.  Musculoskeletal: Negative for back pain, joint pain and myalgias.  Skin: Negative.   Neurological: Negative for dizziness.  Psychiatric/Behavioral: The patient is not nervous/anxious.     Physical Exam  Constitutional: She appears well-developed and well-nourished. No distress.  HENT:  Mouth/Throat: Oropharynx is clear and moist.  Neck: No thyromegaly present.  Cardiovascular: Normal rate, regular rhythm, normal heart sounds and intact distal pulses.  Pulmonary/Chest: Effort normal. No respiratory distress. She has wheezes.  Rhonchi scattered   Abdominal: Soft. Bowel sounds are normal. She  exhibits no distension. There is no tenderness.  Musculoskeletal: She exhibits no edema.  Is able to move all extremities  AFO left foot Left hand splint     Lymphadenopathy:    She has no cervical adenopathy.  Neurological: She is alert.  Skin: Skin is warm and dry. She is not diaphoretic.  Psychiatric: She has a normal mood and affect.     ASSESSMENT/ PLAN:  TODAY:   1. Acute bronchitis: is worse: will being augemtin 875 mg twice daily through 12-02-17 with flroastor twice daily will begin duoneb every 6 hours routinely through 12-05-17.        MD is aware of resident's narcotic use and is in agreement with current plan of care. We will attempt to wean resident as apropriate   Ok Edwards NP [facility]  Contact [phone] Monday through Friday 8am- 5pm  After hours call [phone]

## 2017-11-28 ENCOUNTER — Other Ambulatory Visit
Admission: RE | Admit: 2017-11-28 | Discharge: 2017-11-28 | Disposition: A | Discharge status: Home / Self Care | Payer: PPO | Source: Skilled Nursing Facility | Attending: Adult Health | Admitting: Adult Health

## 2017-11-28 ENCOUNTER — Non-Acute Institutional Stay (SKILLED_NURSING_FACILITY): Payer: PPO | Admitting: Adult Health

## 2017-11-28 ENCOUNTER — Encounter: Payer: Self-pay | Admitting: Adult Health

## 2017-11-28 DIAGNOSIS — E039 Hypothyroidism, unspecified: Secondary | ICD-10-CM | POA: Insufficient documentation

## 2017-11-28 DIAGNOSIS — J209 Acute bronchitis, unspecified: Secondary | ICD-10-CM | POA: Insufficient documentation

## 2017-11-28 LAB — TSH: TSH: 6.379 u[IU]/mL — AB (ref 0.350–4.500)

## 2017-11-28 NOTE — Progress Notes (Signed)
[facility]   [facility] Room Number: [place] of Service:  SNF (31)   CODE STATUS: Full Code  Allergies  Allergen Reactions  . Tizanidine Other (See Comments)    HYPOTENSION AND UNRESPONSIVENESS  . Ace Inhibitors Cough  . Latex Hives  . Lisinopril Cough  . Tetracycline Other (See Comments)     REACTION: yeast  . Zanaflex [Tizanidine Hcl] Other (See Comments)    "drowsy"    Chief Complaint  Patient presents with  . Acute Visit    Lab follow up    HPI:  Her tsh is elevated at 6.379 she is presently taking synthroid 50 mcg daily. She denies any excessive fatigue; no changes in appetite; no insomnia   Past Medical History:  Diagnosis Date  . Arthritis   . Asthma   . 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 Depression with anxiety   . GERD (gastroesophageal reflux disease)   . Hemiparesis affecting left side as late effect of cerebrovascular accident (CVA) ([place])   . Hot flashes   . Hyperlipidemia   . Hypertension   . Muscle hypertonicity   . Osteoarthritis   . Stroke ([place])   . TIA (transient ischemic attack)     Past Surgical History:  Procedure Laterality Date  . ACHILLES TENDON LENGTHENING  08/25/2015   Procedure: ACHILLES TENDON LENGTHENING;  Surgeon: Samara Deist, DPM;  Location: [facility] ORS;  Service: Podiatry;;  . APPENDECTOMY    . COLONOSCOPY  06/17/2008  . EYE SURGERY Bilateral    Cataract Extraction with IOL  . FLAT FOOT RECONSTRUCTION-TAL GASTROC RECESSION Left 08/25/2015   Procedure: ENDOSCOPIC FLAT FOOT RECONSTRUCTION-TAL GASTROC RECESSION;  Surgeon: Samara Deist, DPM;  Location: [facility] ORS;  Service: Podiatry;  Laterality: Left;  Marland Kitchen VAGINAL HYSTERECTOMY      Social History   Socioeconomic History  . Marital status: Married    Spouse name: Not on file  . Number of children: Not on file  . Years of education: Not on file  . Highest education level: Not on file  Occupational History  . Occupation: retired    Scientific laboratory technician  . Financial resource strain: Not on file  . Food insecurity:    Worry: Not on file    Inability: Not on file  . Transportation needs:    Medical: Not on file    Non-medical: Not on file  Tobacco Use  . Smoking status: Former Smoker    Packs/day: 0.50    Years: 3.00    Pack years: 1.50    Types: Cigarettes    Last attempt to quit: 01/08/1963    Years since quitting: 54.9  . Smokeless tobacco: Never Used  Substance and Sexual Activity  . Alcohol use: No    Alcohol/week: 0.0 standard drinks    Comment: does not drink alcohol  . Drug use: No  . Sexual activity: Not on file  Lifestyle  . Physical activity:    Days per week: Not on file    Minutes per session: Not on file  . Stress: Not on file  Relationships  . Social connections:    Talks on phone: Not on file    Gets together: Not on file    Attends religious service: Not on file    Active member of club or organization: Not on file    Attends meetings of clubs or organizations: Not on file    Relationship status: Not on file  . Intimate  partner violence:    Fear of current or ex partner: Not on file    Emotionally abused: Not on file    Physically abused: Not on file    Forced sexual activity: Not on file  Other Topics Concern  . Not on file  Social History Narrative   Admitted to [facility] [place] 11/30/2014   Married   Former smoker   Full Code   Family History  Problem Relation Age of Onset  . Alcohol abuse Mother   . Coronary artery disease Mother   . Stroke Mother   . Liver cancer Father   . Alcohol abuse Brother   . Coronary artery disease Brother   . Diabetes type II Brother   . Liver cancer Brother       VITAL SIGNS BP (!) 120/57   Pulse 90   Temp (!) 97.5 F (36.4 C)   Resp 14   Ht 5' 1"  (1.549 m)   Wt 184 lb 9.6 oz (83.7 kg)   SpO2 91%   BMI 34.88 kg/m   Outpatient Encounter Medications as of 11/28/2017  Medication Sig  . acetaminophen (TYLENOL) 325 MG tablet Take  650 mg by mouth every 6 (six) hours as needed. for pain/ increased temp. May be administered orally, per G-tube if needed or rectally if unable to swallow (separate order). Maximum dose for 24 hours is 3,000 mg from all sources of Acetaminophen/ Tylenol  . acetylcysteine (MUCOMYST) 10 % nebulizer solution Take 10 mLs by nebulization daily. Give after AM Duoneb for assistance with mucus expectoration  . alum & mag hydroxide-simeth (MAALOX PLUS) 400-400-40 MG/5ML suspension Take 30 mLs by mouth every 4 (four) hours as needed for indigestion.   Marland Kitchen amoxicillin-clavulanate (AUGMENTIN) 875-125 MG tablet Take 1 tablet by mouth 2 (two) times daily.  . Artificial Saliva (BIOTENE MOISTURIZING MOUTH MT) Use as directed 2 sprays in the mouth or throat 4 (four) times daily as needed.  . Calcium Carbonate-Vitamin D (CALCIUM 600+D) 600-400 MG-UNIT tablet Take 1 tablet by mouth 2 (two) times daily.  . Carboxymeth-Glycerin-Polysorb (REFRESH OPTIVE ADVANCED) 0.5-1-0.5 % SOLN Place 2 drops into both eyes 2 (two) times daily.  . carvedilol (COREG) 3.125 MG tablet Take 3.125 mg by mouth 2 (two) times daily.   . cetirizine (ZYRTEC) 5 MG tablet Take 5 mg by mouth at bedtime.  . cyanocobalamin 1000 MCG tablet Take 1,000 mcg by mouth daily.  Marland Kitchen demeclocycline (DECLOMYCIN) 300 MG tablet Take 300 mg by mouth 2 (two) times daily.   Marland Kitchen dextromethorphan (DELSYM) 30 MG/5ML liquid Take 10 mLs by mouth every 12 (twelve) hours as needed for cough.  . dipyridamole-aspirin (AGGRENOX) 200-25 MG 12hr capsule Take 1 capsule by mouth every 12 (twelve) hours.   . divalproex (DEPAKOTE SPRINKLE) 125 MG capsule Take 250 mg by mouth 2 (two) times daily. 2 caps  . fluticasone furoate-vilanterol (BREO ELLIPTA) 100-25 MCG/INH AEPB Inhale 1 puff into the lungs daily.  Marland Kitchen gabapentin (NEURONTIN) 400 MG capsule Take 400 mg by mouth 3 (three) times daily.  Marland Kitchen guaiFENesin (MUCINEX) 600 MG 12 hr tablet Take 1,200 mg by mouth every 12 (twelve) hours.   Marland Kitchen  ipratropium (ATROVENT) 0.03 % nasal spray Place 2 sprays into both nostrils at bedtime.   Marland Kitchen ipratropium-albuterol (DUONEB) 0.5-2.5 (3) MG/3ML SOLN Take 3 mLs by nebulization every 6 (six) hours. Starting 11/25/17 - 12/05/17 then every 6 hours as needed  . levothyroxine (SYNTHROID, LEVOTHROID) 50 MCG tablet Take 50 mcg by mouth daily before  breakfast.   . magnesium hydroxide (MILK OF MAGNESIA) 400 MG/5ML suspension Take 30 mLs by mouth every 4 (four) hours as needed for mild constipation.  . Menthol (HALLS COUGH DROPS MT) Take 3.2 mg by mouth as needed   . Menthol, Topical Analgesic, 4 % GEL Apply 1 application topically 2 (two) times daily. Apply to bilateral lower extremities  . montelukast (SINGULAIR) 10 MG tablet Take 10 mg by mouth at bedtime.   Marland Kitchen omeprazole (PRILOSEC) 20 MG capsule Take 20 mg by mouth 2 (two) times daily.   Marland Kitchen oxybutynin (DITROPAN) 5 MG tablet Take 5 mg by mouth 2 (two) times daily.   . OXYGEN Inhale 2 L into the lungs every 4 (four) hours as needed. for dyspnea, Check O2 saturations prior to placing on patient and at least every 4 hours after applying. Notify MD if oxygen saturations drop below baseline while receiving oxygen or no improvement in dyspnea  . polyethylene glycol (MIRALAX / GLYCOLAX) packet Take 17 g by mouth daily. dx: constipation  Hold if loose stool  . prazosin (MINIPRESS) 1 MG capsule Take 3 mg by mouth at bedtime. 3 capsules  . Probiotic Product (RISA-BID PROBIOTIC) TABS Take 1 tablet by mouth 2 (two) times daily.  . Salicylic Acid 40 % PADS Apply 1 corn pad to Right 5th toe daily until corn resolved  . sennosides-docusate sodium (SENOKOT-S) 8.6-50 MG tablet Take 2 tablets by mouth 2 (two) times daily as needed for constipation. Also take 1 tab by mouth at bedtime daily.  . Skin Protectants, Misc. (ENDIT EX) Apply liberal amount topically to areas of skin irritation as needed. Ok to leave at bedside.  . sodium chloride 1 g tablet Take 1 g by mouth daily.     Marland Kitchen spironolactone (ALDACTONE) 50 MG tablet Take 50 mg by mouth daily.  Marland Kitchen torsemide (DEMADEX) 10 MG tablet Take 10 mg by mouth daily.   Marland Kitchen triamcinolone (NASACORT ALLERGY 24HR) 55 MCG/ACT AERO nasal inhaler Place 1 spray into the nose daily.  Marland Kitchen UNABLE TO FIND Diet Type: Regular. No Sodium Restriction. Gatorade on lunch and supper trays for hyponatremia  . venlafaxine XR (EFFEXOR-XR) 75 MG 24 hr capsule Take 75 mg by mouth daily with breakfast.   No facility-administered encounter medications on file as of 11/28/2017.      SIGNIFICANT DIAGNOSTIC EXAMS   LABS REVIEWED: PREVIOUS   04-24-17: wbc 5.8; hgb 11.4; hct 33.7; mcv 95.2; plt 196 glucose 96; bun 27; creat 0.97; k+ 4.0; na++ 134; ca 9.2; liver normal albumin 3.3 chol 177; ldl 121; trig 86 hdl 39; mag 1.8 tsh 7.731;  Vit B 12 329; vit D 41.0;  04-25-17: depakote free 7.3  06-05-17: tsh 5.209; depakote free 5.9 08-21-17: wbc 11.8; hgb 10.0; hct 29.4; mcv 92.2; plt 278; glucose 94; bun 13; creat 1.01; k+ 3.7; na++ 128; ca 9.1; alk phos 229; albumin 2.4  10-17-17: tsh 7.780  TODAY:   11-28-17:  tsh 6.379   Review of Systems  Constitutional: Negative for malaise/fatigue.  Respiratory: Negative for cough and shortness of breath.   Cardiovascular: Negative for chest pain, palpitations and leg swelling.  Gastrointestinal: Negative for abdominal pain, constipation and heartburn.  Musculoskeletal: Negative for back pain, joint pain and myalgias.  Skin: Negative.   Neurological: Negative for dizziness.  Psychiatric/Behavioral: The patient is not nervous/anxious.    Physical Exam  Constitutional: She appears well-developed and well-nourished. No distress.  Neck: No thyromegaly present.  Cardiovascular: Normal rate, regular rhythm, normal heart  sounds and intact distal pulses.  Pulmonary/Chest: Effort normal and breath sounds normal. No respiratory distress.  Abdominal: Soft. Bowel sounds are normal. She exhibits no distension. There is no  tenderness.  Musculoskeletal: She exhibits no edema.  Is able to move all extremities  AFO left foot Left hand splint    Lymphadenopathy:    She has no cervical adenopathy.  Neurological: She is alert.  Skin: Skin is warm and dry. She is not diaphoretic.  Psychiatric: She has a normal mood and affect.     ASSESSMENT/ PLAN:  TODAY:   1. Acquired hypothyroidism: is worse; will increase synthroid to 75 mcg daily and will check tsh on 01-08-18  MD is aware of resident's narcotic use and is in agreement with current plan of care. We will attempt to wean resident as apropriate   Ok Edwards NP [facility]  Contact [phone] Monday through Friday 8am- 5pm  After hours call [phone]

## 2017-12-07 ENCOUNTER — Encounter
Admission: RE | Admit: 2017-12-07 | Discharge: 2017-12-07 | Disposition: A | Discharge status: Home / Self Care | Payer: PPO | Source: Ambulatory Visit | Attending: Internal Medicine | Admitting: Internal Medicine

## 2017-12-08 ENCOUNTER — Non-Acute Institutional Stay (SKILLED_NURSING_FACILITY): Payer: PPO | Admitting: Adult Health

## 2017-12-08 ENCOUNTER — Encounter: Payer: Self-pay | Admitting: Adult Health

## 2017-12-08 DIAGNOSIS — M24542 Contracture, left hand: Secondary | ICD-10-CM | POA: Diagnosis not present

## 2017-12-08 DIAGNOSIS — M6281 Muscle weakness (generalized): Secondary | ICD-10-CM | POA: Diagnosis not present

## 2017-12-08 DIAGNOSIS — I69354 Hemiplegia and hemiparesis following cerebral infarction affecting left non-dominant side: Secondary | ICD-10-CM | POA: Diagnosis not present

## 2017-12-08 DIAGNOSIS — M79602 Pain in left arm: Secondary | ICD-10-CM | POA: Diagnosis not present

## 2017-12-08 DIAGNOSIS — J209 Acute bronchitis, unspecified: Secondary | ICD-10-CM | POA: Diagnosis not present

## 2017-12-08 DIAGNOSIS — R262 Difficulty in walking, not elsewhere classified: Secondary | ICD-10-CM | POA: Diagnosis not present

## 2017-12-08 DIAGNOSIS — M79605 Pain in left leg: Secondary | ICD-10-CM | POA: Diagnosis not present

## 2017-12-08 DIAGNOSIS — I69398 Other sequelae of cerebral infarction: Secondary | ICD-10-CM | POA: Diagnosis not present

## 2017-12-08 NOTE — Progress Notes (Signed)
[facility]   [facility] Room Number: [place] of Service:  SNF (31)   CODE STATUS: Full Code  Allergies  Allergen Reactions  . Tizanidine Other (See Comments)    HYPOTENSION AND UNRESPONSIVENESS  . Ace Inhibitors Cough  . Latex Hives  . Lisinopril Cough  . Tetracycline Other (See Comments)     REACTION: yeast  . Zanaflex [Tizanidine Hcl] Other (See Comments)    "drowsy"    Chief Complaint  Patient presents with  . Acute Visit    Cough    HPI:  Staff reports that she is having a cough. She has completed her Augmentin on 12-02-17. There are no reports of fevers present. She denies any sputum production and no shortness of breath. She does have a cough which she states is sloly getting better.   Past Medical History:  Diagnosis Date  . Arthritis   . Asthma   . 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 Depression with anxiety   . GERD (gastroesophageal reflux disease)   . Hemiparesis affecting left side as late effect of cerebrovascular accident (CVA) ([place])   . Hot flashes   . Hyperlipidemia   . Hypertension   . Muscle hypertonicity   . Osteoarthritis   . Stroke ([place])   . TIA (transient ischemic attack)     Past Surgical History:  Procedure Laterality Date  . ACHILLES TENDON LENGTHENING  08/25/2015   Procedure: ACHILLES TENDON LENGTHENING;  Surgeon: Samara Deist, DPM;  Location: [facility] ORS;  Service: Podiatry;;  . APPENDECTOMY    . COLONOSCOPY  06/17/2008  . EYE SURGERY Bilateral    Cataract Extraction with IOL  . FLAT FOOT RECONSTRUCTION-TAL GASTROC RECESSION Left 08/25/2015   Procedure: ENDOSCOPIC FLAT FOOT RECONSTRUCTION-TAL GASTROC RECESSION;  Surgeon: Samara Deist, DPM;  Location: [facility] ORS;  Service: Podiatry;  Laterality: Left;  Marland Kitchen VAGINAL HYSTERECTOMY      Social History   Socioeconomic History  . Marital status: Married    Spouse name: Not on file  . Number of children: Not on file  . Years of education: Not on  file  . Highest education level: Not on file  Occupational History  . Occupation: retired  Scientific laboratory technician  . Financial resource strain: Not on file  . Food insecurity:    Worry: Not on file    Inability: Not on file  . Transportation needs:    Medical: Not on file    Non-medical: Not on file  Tobacco Use  . Smoking status: Former Smoker    Packs/day: 0.50    Years: 3.00    Pack years: 1.50    Types: Cigarettes    Last attempt to quit: 01/08/1963    Years since quitting: 54.9  . Smokeless tobacco: Never Used  Substance and Sexual Activity  . Alcohol use: No    Alcohol/week: 0.0 standard drinks    Comment: does not drink alcohol  . Drug use: No  . Sexual activity: Not on file  Lifestyle  . Physical activity:    Days per week: Not on file    Minutes per session: Not on file  . Stress: Not on file  Relationships  . Social connections:    Talks on phone: Not on file    Gets together: Not on file    Attends religious service: Not on file    Active member of club or organization: Not on file    Attends meetings of clubs  or organizations: Not on file    Relationship status: Not on file  . Intimate partner violence:    Fear of current or ex partner: Not on file    Emotionally abused: Not on file    Physically abused: Not on file    Forced sexual activity: Not on file  Other Topics Concern  . Not on file  Social History Narrative   Admitted to [facility] [place] 11/30/2014   Married   Former smoker   Full Code   Family History  Problem Relation Age of Onset  . Alcohol abuse Mother   . Coronary artery disease Mother   . Stroke Mother   . Liver cancer Father   . Alcohol abuse Brother   . Coronary artery disease Brother   . Diabetes type II Brother   . Liver cancer Brother       VITAL SIGNS BP 136/81   Pulse 81   Temp 98 F (36.7 C)   Resp 14   Ht 5' 1"  (1.549 m)   Wt 184 lb 9.6 oz (83.7 kg)   SpO2 94%   BMI 34.88 kg/m   Outpatient Encounter Medications  as of 12/08/2017  Medication Sig  . acetaminophen (TYLENOL) 325 MG tablet Take 650 mg by mouth every 6 (six) hours as needed. for pain/ increased temp. May be administered orally, per G-tube if needed or rectally if unable to swallow (separate order). Maximum dose for 24 hours is 3,000 mg from all sources of Acetaminophen/ Tylenol  . acetylcysteine (MUCOMYST) 10 % nebulizer solution Take 10 mLs by nebulization daily. Give after AM Duoneb for assistance with mucus expectoration  . alum & mag hydroxide-simeth (MAALOX PLUS) 400-400-40 MG/5ML suspension Take 30 mLs by mouth every 4 (four) hours as needed for indigestion.   . Artificial Saliva (BIOTENE MOISTURIZING MOUTH MT) Use as directed 2 sprays in the mouth or throat 4 (four) times daily as needed.  . Calcium Carbonate-Vitamin D (CALCIUM 600+D) 600-400 MG-UNIT tablet Take 1 tablet by mouth 2 (two) times daily.  . Carboxymeth-Glycerin-Polysorb (REFRESH OPTIVE ADVANCED) 0.5-1-0.5 % SOLN Place 2 drops into both eyes 2 (two) times daily.  . carvedilol (COREG) 3.125 MG tablet Take 3.125 mg by mouth 2 (two) times daily.   . cetirizine (ZYRTEC) 5 MG tablet Take 5 mg by mouth at bedtime.  . cyanocobalamin 1000 MCG tablet Take 1,000 mcg by mouth daily.  Marland Kitchen demeclocycline (DECLOMYCIN) 300 MG tablet Take 300 mg by mouth 2 (two) times daily.   Marland Kitchen dextromethorphan (DELSYM) 30 MG/5ML liquid Take 10 mLs by mouth every 12 (twelve) hours as needed for cough.  . dipyridamole-aspirin (AGGRENOX) 200-25 MG 12hr capsule Take 1 capsule by mouth every 12 (twelve) hours.   . divalproex (DEPAKOTE SPRINKLE) 125 MG capsule Take 250 mg by mouth 2 (two) times daily. 2 caps  . fluticasone furoate-vilanterol (BREO ELLIPTA) 100-25 MCG/INH AEPB Inhale 1 puff into the lungs daily.  Marland Kitchen gabapentin (NEURONTIN) 400 MG capsule Take 400 mg by mouth 3 (three) times daily.  Marland Kitchen guaiFENesin (MUCINEX) 600 MG 12 hr tablet Take 1,200 mg by mouth every 12 (twelve) hours.   Marland Kitchen ipratropium (ATROVENT)  0.03 % nasal spray Place 2 sprays into both nostrils at bedtime.   Marland Kitchen ipratropium-albuterol (DUONEB) 0.5-2.5 (3) MG/3ML SOLN Take 3 mLs by nebulization every 6 (six) hours as needed.   Marland Kitchen levothyroxine (SYNTHROID, LEVOTHROID) 75 MCG tablet Take 75 mcg by mouth daily before breakfast.   . magnesium hydroxide (MILK OF MAGNESIA)  400 MG/5ML suspension Take 30 mLs by mouth every 4 (four) hours as needed for mild constipation.  . Menthol (HALLS COUGH DROPS MT) Take 3.2 mg by mouth as needed   . Menthol, Topical Analgesic, 4 % GEL Apply 1 application topically 2 (two) times daily. Apply to bilateral lower extremities  . montelukast (SINGULAIR) 10 MG tablet Take 10 mg by mouth at bedtime.   Marland Kitchen omeprazole (PRILOSEC) 20 MG capsule Take 20 mg by mouth 2 (two) times daily.   Marland Kitchen oxybutynin (DITROPAN) 5 MG tablet Take 5 mg by mouth 2 (two) times daily.   . OXYGEN Inhale 2 L into the lungs every 4 (four) hours as needed. for dyspnea, Check O2 saturations prior to placing on patient and at least every 4 hours after applying. Notify MD if oxygen saturations drop below baseline while receiving oxygen or no improvement in dyspnea  . polyethylene glycol (MIRALAX / GLYCOLAX) packet Take 17 g by mouth daily. dx: constipation  Hold if loose stool  . prazosin (MINIPRESS) 1 MG capsule Take 3 mg by mouth at bedtime. 3 capsules  . Salicylic Acid 40 % PADS Apply 1 corn pad to Right 5th toe daily until corn resolved  . sennosides-docusate sodium (SENOKOT-S) 8.6-50 MG tablet Take 2 tablets by mouth 2 (two) times daily as needed for constipation. Also take 1 tab by mouth at bedtime daily.  . Skin Protectants, Misc. (ENDIT EX) Apply liberal amount topically to areas of skin irritation as needed. Ok to leave at bedside.  . sodium chloride 1 g tablet Take 1 g by mouth daily.   Marland Kitchen spironolactone (ALDACTONE) 50 MG tablet Take 50 mg by mouth daily.  Marland Kitchen torsemide (DEMADEX) 10 MG tablet Take 10 mg by mouth daily.   Marland Kitchen triamcinolone (NASACORT  ALLERGY 24HR) 55 MCG/ACT AERO nasal inhaler Place 1 spray into the nose daily.  Marland Kitchen UNABLE TO FIND Diet Type: Regular. No Sodium Restriction. Gatorade on lunch and supper trays for hyponatremia  . venlafaxine XR (EFFEXOR-XR) 75 MG 24 hr capsule Take 75 mg by mouth daily with breakfast.  . [DISCONTINUED] Probiotic Product (RISA-BID PROBIOTIC) TABS Take 1 tablet by mouth 2 (two) times daily.   No facility-administered encounter medications on file as of 12/08/2017.      SIGNIFICANT DIAGNOSTIC EXAMS  LABS REVIEWED: PREVIOUS   04-24-17: wbc 5.8; hgb 11.4; hct 33.7; mcv 95.2; plt 196 glucose 96; bun 27; creat 0.97; k+ 4.0; na++ 134; ca 9.2; liver normal albumin 3.3 chol 177; ldl 121; trig 86 hdl 39; mag 1.8 tsh 7.731;  Vit B 12 329; vit D 41.0;  04-25-17: depakote free 7.3  06-05-17: tsh 5.209; depakote free 5.9 08-21-17: wbc 11.8; hgb 10.0; hct 29.4; mcv 92.2; plt 278; glucose 94; bun 13; creat 1.01; k+ 3.7; na++ 128; ca 9.1; alk phos 229; albumin 2.4  10-17-17: tsh 7.780 11-28-17:  tsh 6.379   NO NEW LABS.    Review of Systems  Constitutional: Negative for malaise/fatigue.  Respiratory: Positive for cough. Negative for sputum production and shortness of breath.   Cardiovascular: Negative for chest pain, palpitations and leg swelling.  Gastrointestinal: Negative for abdominal pain, constipation and heartburn.  Musculoskeletal: Negative for back pain, joint pain and myalgias.  Skin: Negative.   Neurological: Negative for dizziness.  Psychiatric/Behavioral: The patient is not nervous/anxious.    Physical Exam  Constitutional: She appears well-developed and well-nourished. No distress.  Neck: No thyromegaly present.  Cardiovascular: Normal rate, regular rhythm, normal heart sounds and intact distal  pulses.  Pulmonary/Chest: Effort normal and breath sounds normal. No respiratory distress.  Abdominal: Soft. Bowel sounds are normal. She exhibits no distension. There is no tenderness.    Musculoskeletal: She exhibits no edema.  Is able to move all extremities  AFO left foot Left hand splint     Lymphadenopathy:    She has no cervical adenopathy.  Neurological: She is alert.  Skin: Skin is warm and dry. She is not diaphoretic.  Psychiatric: She has a normal mood and affect.     ASSESSMENT/ PLAN:  TODAY:   1. Acute bronchitis: is improving; will not make changes at this time will continue to monitor her status for signs of infection.   MD is aware of resident's narcotic use and is in agreement with current plan of care. We will attempt to wean resident as apropriate   Ok Edwards NP [facility]  Contact [phone] Monday through Friday 8am- 5pm  After hours call [phone]

## 2017-12-12 ENCOUNTER — Encounter: Payer: Self-pay | Admitting: Adult Health

## 2017-12-12 ENCOUNTER — Non-Acute Institutional Stay (SKILLED_NURSING_FACILITY): Payer: PPO | Admitting: Adult Health

## 2017-12-12 DIAGNOSIS — J309 Allergic rhinitis, unspecified: Secondary | ICD-10-CM

## 2017-12-12 DIAGNOSIS — E039 Hypothyroidism, unspecified: Secondary | ICD-10-CM

## 2017-12-12 DIAGNOSIS — N3281 Overactive bladder: Secondary | ICD-10-CM

## 2017-12-12 DIAGNOSIS — R600 Localized edema: Secondary | ICD-10-CM | POA: Diagnosis not present

## 2017-12-12 DIAGNOSIS — E222 Syndrome of inappropriate secretion of antidiuretic hormone: Secondary | ICD-10-CM | POA: Diagnosis not present

## 2017-12-12 DIAGNOSIS — I10 Essential (primary) hypertension: Secondary | ICD-10-CM | POA: Diagnosis not present

## 2017-12-12 DIAGNOSIS — I69354 Hemiplegia and hemiparesis following cerebral infarction affecting left non-dominant side: Secondary | ICD-10-CM | POA: Diagnosis not present

## 2017-12-12 DIAGNOSIS — I6350 Cerebral infarction due to unspecified occlusion or stenosis of unspecified cerebral artery: Secondary | ICD-10-CM

## 2017-12-12 DIAGNOSIS — J45909 Unspecified asthma, uncomplicated: Secondary | ICD-10-CM

## 2017-12-12 DIAGNOSIS — E871 Hypo-osmolality and hyponatremia: Secondary | ICD-10-CM | POA: Diagnosis not present

## 2017-12-12 DIAGNOSIS — K219 Gastro-esophageal reflux disease without esophagitis: Secondary | ICD-10-CM | POA: Diagnosis not present

## 2017-12-12 NOTE — Progress Notes (Signed)
Provider:  Ok Edwards, NP [facility]:  [facility] Room Number: [place] of Service:  SNF (31)   PCP: Kirk Ruths, MD Patient Care Team: Kirk Ruths, MD as PCP - General (Internal Medicine) Nyoka Cowden Phylis Bougie, NP as Nurse Practitioner (Geriatric Medicine)  Extended Emergency Contact Information Primary Emergency Contact: Norm Salt Address: [address]          [name] Phone: [phone] Mobile Phone: [phone] Relation: Spouse Secondary Emergency Contact: Barry Dienes, [place] [address] [place] Phone: [phone] Mobile Phone: [phone] Relation: Daughter  Code Status: Full Code Goals of Care: Advanced Directive information Advanced Directives 12/12/2017  Does Patient Have a Medical Advance Directive? No  Type of Advance Directive -  Does patient want to make changes to medical advance directive? -  Copy of [facility] in Chart? -  Would patient like information on creating a medical advance directive? No - Patient declined      Allergies  Allergen Reactions  . Tizanidine Other (See Comments)    HYPOTENSION AND UNRESPONSIVENESS  . Ace Inhibitors Cough  . Latex Hives  . Lisinopril Cough  . Tetracycline Other (See Comments)     REACTION: yeast  . Zanaflex [Tizanidine Hcl] Other (See Comments)    "drowsy"     Chief Complaint  Patient presents with  . Annual Exam        HPI: Patient is a 77 y.o. female seen today for an annual comprehensive examination. She has not required hospitalization over the past year. She denies any uncontrolled pain; no coughing; no changes in appetite; no anxiety or depressive thoughts. She will continue to be followed for her chronic illnesses including: CVA: hypertension; asthma; gerd.    Past Medical History:  Diagnosis Date  . Arthritis   . Asthma   . Contracture of joint of  finger of left hand   . Depression with anxiety   . GERD (gastroesophageal reflux disease)   . Hemiparesis affecting left side as late effect of cerebrovascular accident (CVA) ([place])   . Hot flashes   . Hyperlipidemia   . Hypertension   . Muscle hypertonicity   . Osteoarthritis   . Stroke ([place])   . TIA (transient ischemic attack)    Past Surgical History:  Procedure Laterality Date  . ACHILLES TENDON LENGTHENING  08/25/2015   Procedure: ACHILLES TENDON LENGTHENING;  Surgeon: Samara Deist, DPM;  Location: [facility] ORS;  Service: Podiatry;;  . APPENDECTOMY    . COLONOSCOPY  06/17/2008  . EYE SURGERY Bilateral    Cataract Extraction with IOL  . FLAT FOOT RECONSTRUCTION-TAL GASTROC RECESSION Left 08/25/2015   Procedure: ENDOSCOPIC FLAT FOOT RECONSTRUCTION-TAL GASTROC RECESSION;  Surgeon: Samara Deist, DPM;  Location: [facility] ORS;  Service: Podiatry;  Laterality: Left;  Marland Kitchen VAGINAL HYSTERECTOMY      reports that she quit smoking about 54 years ago. Her smoking use included cigarettes. She has a 1.50 pack-year smoking history. She has never used smokeless tobacco. She reports that she does not drink alcohol or use drugs. Social History   Socioeconomic History  . Marital status: Married    Spouse name: Not on file  . Number of children: Not on file  . Years of education: Not on file  . Highest education level: Not on file  Occupational History  . Occupation: retired  Scientific laboratory technician  . Financial  resource strain: Not on file  . Food insecurity:    Worry: Not on file    Inability: Not on file  . Transportation needs:    Medical: Not on file    Non-medical: Not on file  Tobacco Use  . Smoking status: Former Smoker    Packs/day: 0.50    Years: 3.00    Pack years: 1.50    Types: Cigarettes    Last attempt to quit: 01/08/1963    Years since quitting: 54.9  . Smokeless tobacco: Never Used  Substance and Sexual Activity  . Alcohol use: No    Alcohol/week: 0.0 standard drinks    Comment: does  not drink alcohol  . Drug use: No  . Sexual activity: Not on file  Lifestyle  . Physical activity:    Days per week: Not on file    Minutes per session: Not on file  . Stress: Not on file  Relationships  . Social connections:    Talks on phone: Not on file    Gets together: Not on file    Attends religious service: Not on file    Active member of club or organization: Not on file    Attends meetings of clubs or organizations: Not on file    Relationship status: Not on file  . Intimate partner violence:    Fear of current or ex partner: Not on file    Emotionally abused: Not on file    Physically abused: Not on file    Forced sexual activity: Not on file  Other Topics Concern  . Not on file  Social History Narrative   Admitted to [facility] 11/30/2014   Married   Former smoker   Full Code   Family History  Problem Relation Age of Onset  . Alcohol abuse Mother   . Coronary artery disease Mother   . Stroke Mother   . Liver cancer Father   . Alcohol abuse Brother   . Coronary artery disease Brother   . Diabetes type II Brother   . Liver cancer Brother     Vitals:   12/12/17 0849  BP: 136/81  Pulse: 81  Resp: 14  Temp: 98 F (36.7 C)  SpO2: 94%  Weight: 184 lb 9.6 oz (83.7 kg)  Height: 5' 1" (1.549 m)   Body mass index is 34.88 kg/m.  Allergies as of 12/12/2017      Reactions   Tizanidine Other (See Comments)   HYPOTENSION AND UNRESPONSIVENESS   Ace Inhibitors Cough   Latex Hives   Lisinopril Cough   Tetracycline Other (See Comments)   REACTION: yeast   Zanaflex [tizanidine Hcl] Other (See Comments)   "drowsy"      Medication List        Accurate as of 12/12/17  8:58 AM. Always use your most recent med list.          acetaminophen 325 MG tablet Commonly known as:  TYLENOL Take 650 mg by mouth every 6 (six) hours as needed. for pain/ increased temp. May be administered orally, per G-tube if needed or rectally if unable to swallow  (separate order). Maximum dose for 24 hours is 3,000 mg from all sources of Acetaminophen/ Tylenol   acetylcysteine 10 % nebulizer solution Commonly known as:  MUCOMYST Take 10 mLs by nebulization daily. Give after AM Duoneb for assistance with mucus expectoration   alum & mag hydroxide-simeth 010-932-35 MG/5ML suspension Commonly known as:  MAALOX PLUS Take 30 mLs by  mouth every 4 (four) hours as needed for indigestion.   BIOTENE MOISTURIZING MOUTH MT Use as directed 2 sprays in the mouth or throat 4 (four) times daily as needed.   BREO ELLIPTA 100-25 MCG/INH Aepb Generic drug:  fluticasone furoate-vilanterol Inhale 1 puff into the lungs daily.   CALCIUM 600+D 600-400 MG-UNIT tablet Generic drug:  Calcium Carbonate-Vitamin D Take 1 tablet by mouth 2 (two) times daily.   carvedilol 3.125 MG tablet Commonly known as:  COREG Take 3.125 mg by mouth 2 (two) times daily.   cetirizine 5 MG tablet Commonly known as:  ZYRTEC Take 5 mg by mouth at bedtime.   cyanocobalamin 1000 MCG tablet Take 1,000 mcg by mouth daily.   DELSYM 30 MG/5ML liquid Generic drug:  dextromethorphan Take 10 mLs by mouth every 12 (twelve) hours as needed for cough.   demeclocycline 300 MG tablet Commonly known as:  DECLOMYCIN Take 300 mg by mouth 2 (two) times daily.   dipyridamole-aspirin 200-25 MG 12hr capsule Commonly known as:  AGGRENOX Take 1 capsule by mouth every 12 (twelve) hours.   divalproex 125 MG capsule Commonly known as:  DEPAKOTE SPRINKLE Take 250 mg by mouth 2 (two) times daily. 2 caps   ENDIT EX Apply liberal amount topically to areas of skin irritation as needed. Ok to leave at bedside.   gabapentin 400 MG capsule Commonly known as:  NEURONTIN Take 400 mg by mouth 3 (three) times daily.   guaiFENesin 600 MG 12 hr tablet Commonly known as:  MUCINEX Take 1,200 mg by mouth every 12 (twelve) hours.   HALLS COUGH DROPS MT Take 3.2 mg by mouth as needed   ipratropium 0.03 %  nasal spray Commonly known as:  ATROVENT Place 2 sprays into both nostrils at bedtime.   ipratropium-albuterol 0.5-2.5 (3) MG/3ML Soln Commonly known as:  DUONEB Take 3 mLs by nebulization every 6 (six) hours as needed.   levothyroxine 75 MCG tablet Commonly known as:  SYNTHROID, LEVOTHROID Take 75 mcg by mouth daily before breakfast.   magnesium hydroxide 400 MG/5ML suspension Commonly known as:  MILK OF MAGNESIA Take 30 mLs by mouth every 4 (four) hours as needed for mild constipation.   Menthol (Topical Analgesic) 4 % Gel Apply 1 application topically 2 (two) times daily. Apply to bilateral lower extremities   montelukast 10 MG tablet Commonly known as:  SINGULAIR Take 10 mg by mouth at bedtime.   NASACORT ALLERGY 24HR 55 MCG/ACT Aero nasal inhaler Generic drug:  triamcinolone Place 1 spray into the nose daily.   omeprazole 20 MG capsule Commonly known as:  PRILOSEC Take 20 mg by mouth 2 (two) times daily.   oxybutynin 5 MG tablet Commonly known as:  DITROPAN Take 5 mg by mouth 2 (two) times daily.   OXYGEN Inhale 2 L into the lungs every 4 (four) hours as needed. for dyspnea, Check O2 saturations prior to placing on patient and at least every 4 hours after applying. Notify MD if oxygen saturations drop below baseline while receiving oxygen or no improvement in dyspnea   polyethylene glycol packet Commonly known as:  MIRALAX / GLYCOLAX Take 17 g by mouth daily. dx: constipation  Hold if loose stool   prazosin 1 MG capsule Commonly known as:  MINIPRESS Take 3 mg by mouth at bedtime. 3 capsules   REFRESH OPTIVE ADVANCED 0.5-1-0.5 % Soln Generic drug:  Carboxymeth-Glycerin-Polysorb Place 2 drops into both eyes 2 (two) times daily.   Salicylic Acid 40 % Pads Apply  1 corn pad to Right 5th toe daily until corn resolved   sennosides-docusate sodium 8.6-50 MG tablet Commonly known as:  SENOKOT-S Take 2 tablets by mouth 2 (two) times daily as needed for constipation.  Also take 1 tab by mouth at bedtime daily.   sodium chloride 1 g tablet Take 1 g by mouth daily.   spironolactone 50 MG tablet Commonly known as:  ALDACTONE Take 50 mg by mouth daily.   torsemide 10 MG tablet Commonly known as:  DEMADEX Take 10 mg by mouth daily.   UNABLE TO FIND Diet Type: Regular. No Sodium Restriction. Gatorade on lunch and supper trays for hyponatremia   venlafaxine XR 75 MG 24 hr capsule Commonly known as:  EFFEXOR-XR Take 75 mg by mouth daily with breakfast.        SIGNIFICANT DIAGNOSTIC EXAMS  LABS REVIEWED: PREVIOUS   04-24-17: wbc 5.8; hgb 11.4; hct 33.7; mcv 95.2; plt 196 glucose 96; bun 27; creat 0.97; k+ 4.0; na++ 134; ca 9.2; liver normal albumin 3.3 chol 177; ldl 121; trig 86 hdl 39; mag 1.8 tsh 7.731;  Vit B 12 329; vit D 41.0;  04-25-17: depakote free 7.3  06-05-17: tsh 5.209; depakote free 5.9 08-21-17: wbc 11.8; hgb 10.0; hct 29.4; mcv 92.2; plt 278; glucose 94; bun 13; creat 1.01; k+ 3.7; na++ 128; ca 9.1; alk phos 229; albumin 2.4  10-17-17: tsh 7.780 11-28-17:  tsh 6.379   NO NEW LABS.   Review of Systems  Constitutional: Negative for malaise/fatigue.  Respiratory: Negative for cough and shortness of breath.   Cardiovascular: Negative for chest pain, palpitations and leg swelling.  Gastrointestinal: Negative for abdominal pain, constipation and heartburn.  Musculoskeletal: Negative for back pain, joint pain and myalgias.  Skin: Negative.   Neurological: Negative for dizziness.  Psychiatric/Behavioral: The patient is not nervous/anxious.    Physical Exam  Constitutional: She appears well-developed and well-nourished. No distress.  HENT:  Mouth/Throat: Oropharynx is clear and moist.  Eyes: Conjunctivae are normal.  Neck: No thyromegaly present.  Cardiovascular: Normal rate, regular rhythm, normal heart sounds and intact distal pulses.  Pulmonary/Chest: Effort normal and breath sounds normal. No respiratory distress.  Abdominal:  Soft. Bowel sounds are normal. She exhibits no distension. There is no tenderness.  Musculoskeletal: She exhibits no edema.  Is able to move all extremities  AFO left foot Left hand splint     Lymphadenopathy:    She has no cervical adenopathy.  Neurological: She is alert.  Skin: Skin is warm and dry. She is not diaphoretic.  Psychiatric: She has a normal mood and affect.        ASSESSMENT/ PLAN:   TODAY:   1.  Cerebral artery occlusion with cerebral infarction: is neurologically stable; will continue aggrenox twice daily   2. Essential hypertension, benign: is stable b/p 136/81: will continue coreg 3.125 mg twice daily minipress 3 mg nightly; aldactone 50 mg daily   3. Allergic rhinitis: is stable will continue zyrtec 5 mg daily singulair 10 mg daily nasocort daily   4. Uncomplicated asthma: is stable will continue mucomyst neb daily mucinex 1200 mg twice atrovent nasal spray nightly breo daily; duoneb every 6 hours as needed  5. Gastroesophageal reflux disease without esophagitis: stable prilosec 20 mg twice  daily   6. Hypothyroidism due to acquired atrophy of thyroid: stable tsh 6.379 will continue synthroid 75 mcg daily lab work pending.   7.  Overactive bladder: is stable will continue ditropan 5 mg twice daily  8.  Major depression, single episode: stable will continue effexor xr 75 mg daily depakote 250 mg twice daily stabilize mood   9. Chronic constipation: stable will continue miralax daily  senna s nightly and 2 tabs twice daily as needed   10. Bilateral lower extremity edema: is stable will continue demadex 20 mg daily   11. Bilateral chronic knee pain: stable will monitor  12. SIADH: is stable will continue declomycin 300 mg twice daily NACL 1 gm daily and gatorade twice daily   Her health care maintenance is up to date  MD is aware of resident's narcotic use and is in agreement with current plan of care. We will wean dosage as appropriate for resident    Ok Edwards NP [facility]  Contact [phone] Monday through Friday 8am- 5pm  After hours call [phone]

## 2017-12-29 DIAGNOSIS — E871 Hypo-osmolality and hyponatremia: Secondary | ICD-10-CM | POA: Diagnosis not present

## 2017-12-29 DIAGNOSIS — Z789 Other specified health status: Secondary | ICD-10-CM | POA: Diagnosis not present

## 2017-12-29 DIAGNOSIS — I10 Essential (primary) hypertension: Secondary | ICD-10-CM | POA: Diagnosis not present

## 2017-12-29 DIAGNOSIS — R262 Difficulty in walking, not elsewhere classified: Secondary | ICD-10-CM | POA: Diagnosis not present

## 2017-12-29 DIAGNOSIS — I69354 Hemiplegia and hemiparesis following cerebral infarction affecting left non-dominant side: Secondary | ICD-10-CM | POA: Diagnosis not present

## 2018-01-07 ENCOUNTER — Non-Acute Institutional Stay (SKILLED_NURSING_FACILITY): Payer: PPO | Admitting: Adult Health

## 2018-01-07 ENCOUNTER — Encounter
Admission: RE | Admit: 2018-01-07 | Discharge: 2018-01-07 | Disposition: A | Discharge status: Home / Self Care | Payer: PPO | Source: Ambulatory Visit | Attending: Internal Medicine | Admitting: Internal Medicine

## 2018-01-07 ENCOUNTER — Encounter: Payer: Self-pay | Admitting: Adult Health

## 2018-01-07 DIAGNOSIS — I6350 Cerebral infarction due to unspecified occlusion or stenosis of unspecified cerebral artery: Secondary | ICD-10-CM

## 2018-01-07 DIAGNOSIS — J45909 Unspecified asthma, uncomplicated: Secondary | ICD-10-CM

## 2018-01-07 DIAGNOSIS — I10 Essential (primary) hypertension: Secondary | ICD-10-CM

## 2018-01-07 DIAGNOSIS — J3089 Other allergic rhinitis: Secondary | ICD-10-CM

## 2018-01-07 NOTE — Progress Notes (Signed)
 Location:    [facility] Room Number: 333 Place of Service:  SNF (31)   CODE STATUS: full code   Allergies  Allergen Reactions  . Tizanidine Other (See Comments)    HYPOTENSION AND UNRESPONSIVENESS  . Ace Inhibitors Cough  . Latex Hives  . Lisinopril Cough  . Tetracycline Other (See Comments)     REACTION: yeast  . Zanaflex [Tizanidine Hcl] Other (See Comments)    "drowsy"    Chief Complaint  Patient presents with  . Medical Management of Chronic Issues  Cerebral artery occlusion with cerebral infarction; uncomplicated asthma; unspecified asthma severity unspecified whether persistent; chronic non-seasonal allergic rhinitis; essential hypertension benign     HPI:  She is a 78 year old long term resident of this facility being seen for the management of her chronic illnesses; cva; asthma allergic rhinitis; hypertension. She denies any cough or shortness of breath; no changes in her appetite; no complaints of insomnia.    Past Medical History:  Diagnosis Date  . Arthritis   . Asthma   . 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 Depression with anxiety   . GERD (gastroesophageal reflux disease)   . Hemiparesis affecting left side as late effect of cerebrovascular accident (CVA) (HCC)   . Hot flashes   . Hyperlipidemia   . Hypertension   . Muscle hypertonicity   . Osteoarthritis   . Stroke (HCC)   . TIA (transient ischemic attack)     Past Surgical History:  Procedure Laterality Date  . ACHILLES TENDON LENGTHENING  08/25/2015   Procedure: ACHILLES TENDON LENGTHENING;  Surgeon: Justin Fowler, DPM;  Location: [facility] ORS;  Service: Podiatry;;  . APPENDECTOMY    . COLONOSCOPY  06/17/2008  . EYE SURGERY Bilateral    Cataract Extraction with IOL  . FLAT FOOT RECONSTRUCTION-TAL GASTROC RECESSION Left 08/25/2015   Procedure: ENDOSCOPIC FLAT FOOT RECONSTRUCTION-TAL GASTROC RECESSION;  Surgeon: Justin Fowler, DPM;  Location: [facility] ORS;  Service: Podiatry;   Laterality: Left;  . VAGINAL HYSTERECTOMY      Social History   Socioeconomic History  . Marital status: Married    Spouse name: Not on file  . Number of children: Not on file  . Years of education: Not on file  . Highest education level: Not on file  Occupational History  . Occupation: retired  Social Needs  . Financial resource strain: Not on file  . Food insecurity:    Worry: Not on file    Inability: Not on file  . Transportation needs:    Medical: Not on file    Non-medical: Not on file  Tobacco Use  . Smoking status: Former Smoker    Packs/day: 0.50    Years: 3.00    Pack years: 1.50    Types: Cigarettes    Last attempt to quit: 01/08/1963    Years since quitting: 55.0  . Smokeless tobacco: Never Used  Substance and Sexual Activity  . Alcohol use: No    Alcohol/week: 0.0 standard drinks    Comment: does not drink alcohol  . Drug use: No  . Sexual activity: Not on file  Lifestyle  . Physical activity:    Days per week: Not on file    Minutes per session: Not on file  . Stress: Not on file  Relationships  . Social connections:    Talks on phone: Not on file    Gets together: Not on file    Attends religious service:   Not on file    Active member of club or organization: Not on file    Attends meetings of clubs or organizations: Not on file    Relationship status: Not on file  . Intimate partner violence:    Fear of current or ex partner: Not on file    Emotionally abused: Not on file    Physically abused: Not on file    Forced sexual activity: Not on file  Other Topics Concern  . Not on file  Social History Narrative   Admitted to [facility] 11/30/2014   Married   Former smoker   Full Code   Family History  Problem Relation Age of Onset  . Alcohol abuse Mother   . Coronary artery disease Mother   . Stroke Mother   . Liver cancer Father   . Alcohol abuse Brother   . Coronary artery disease Brother   . Diabetes type II Brother   . Liver  cancer Brother       VITAL SIGNS BP 127/62   Pulse 79   Temp 98.3 F (36.8 C)   Resp 16   Ht 5' 1" (1.549 m)   Wt 180 lb 8 oz (81.9 kg)   SpO2 94%   BMI 34.11 kg/m   Outpatient Encounter Medications as of 01/07/2018  Medication Sig  . acetaminophen (TYLENOL) 325 MG tablet Take 650 mg by mouth every 6 (six) hours as needed. for pain/ increased temp. May be administered orally, per G-tube if needed or rectally if unable to swallow (separate order). Maximum dose for 24 hours is 3,000 mg from all sources of Acetaminophen/ Tylenol  . acetylcysteine (MUCOMYST) 10 % nebulizer solution Take 10 mLs by nebulization daily. Give after AM Duoneb for assistance with mucus expectoration  . alum & mag hydroxide-simeth (MAALOX PLUS) 400-400-40 MG/5ML suspension Take 30 mLs by mouth every 4 (four) hours as needed for indigestion.   . Artificial Saliva (BIOTENE MOISTURIZING MOUTH MT) Use as directed 2 sprays in the mouth or throat 4 (four) times daily as needed.  . Calcium Carbonate-Vitamin D (CALCIUM 600+D) 600-400 MG-UNIT tablet Take 1 tablet by mouth 2 (two) times daily.  . Carboxymeth-Glycerin-Polysorb (REFRESH OPTIVE ADVANCED) 0.5-1-0.5 % SOLN Place 2 drops into both eyes 2 (two) times daily.  . carvedilol (COREG) 3.125 MG tablet Take 3.125 mg by mouth 2 (two) times daily.   . cetirizine (ZYRTEC) 5 MG tablet Take 5 mg by mouth at bedtime.  . cyanocobalamin 1000 MCG tablet Take 1,000 mcg by mouth daily.  . demeclocycline (DECLOMYCIN) 300 MG tablet Take 300 mg by mouth 2 (two) times daily.   . dextromethorphan (DELSYM) 30 MG/5ML liquid Take 10 mLs by mouth every 12 (twelve) hours as needed for cough.  . dipyridamole-aspirin (AGGRENOX) 200-25 MG 12hr capsule Take 1 capsule by mouth every 12 (twelve) hours.   . divalproex (DEPAKOTE SPRINKLE) 125 MG capsule Take 250 mg by mouth 2 (two) times daily. 2 caps  . fluticasone furoate-vilanterol (BREO ELLIPTA) 100-25 MCG/INH AEPB Inhale 1 puff into the lungs  daily.   . gabapentin (NEURONTIN) 400 MG capsule Take 400 mg by mouth 3 (three) times daily.  . guaiFENesin (MUCINEX) 600 MG 12 hr tablet Take 1,200 mg by mouth every 12 (twelve) hours.   . ipratropium (ATROVENT) 0.03 % nasal spray Place 2 sprays into both nostrils at bedtime.   . ipratropium-albuterol (DUONEB) 0.5-2.5 (3) MG/3ML SOLN Take 3 mLs by nebulization every 6 (six) hours as needed.   .   levothyroxine (SYNTHROID, LEVOTHROID) 75 MCG tablet Take 75 mcg by mouth daily before breakfast.   . magnesium hydroxide (MILK OF MAGNESIA) 400 MG/5ML suspension Take 30 mLs by mouth every 4 (four) hours as needed for mild constipation.  . Menthol (HALLS COUGH DROPS MT) Take 3.2 mg by mouth as needed   . Menthol, Topical Analgesic, 4 % GEL Apply 1 application topically 2 (two) times daily. Apply to bilateral lower extremities  . montelukast (SINGULAIR) 10 MG tablet Take 10 mg by mouth at bedtime.   . omeprazole (PRILOSEC) 20 MG capsule Take 20 mg by mouth 2 (two) times daily.   . oxybutynin (DITROPAN) 5 MG tablet Take 5 mg by mouth 2 (two) times daily.   . OXYGEN Inhale 2 L into the lungs every 4 (four) hours as needed. for dyspnea, Check O2 saturations prior to placing on patient and at least every 4 hours after applying. Notify MD if oxygen saturations drop below baseline while receiving oxygen or no improvement in dyspnea  . polyethylene glycol (MIRALAX / GLYCOLAX) packet Take 17 g by mouth daily. dx: constipation  Hold if loose stool  . prazosin (MINIPRESS) 1 MG capsule Take 3 mg by mouth at bedtime. 3 capsules  . Salicylic Acid 40 % PADS Apply 1 corn pad to Right 5th toe daily until corn resolved  . sennosides-docusate sodium (SENOKOT-S) 8.6-50 MG tablet Take 2 tablets by mouth 2 (two) times daily as needed for constipation. Also take 1 tab by mouth at bedtime daily.  . Skin Protectants, Misc. (ENDIT EX) Apply liberal amount topically to areas of skin irritation as needed. Ok to leave at bedside.  .  sodium chloride 1 g tablet Take 1 g by mouth daily.   . spironolactone (ALDACTONE) 50 MG tablet Take 50 mg by mouth daily.  . torsemide (DEMADEX) 10 MG tablet Take 10 mg by mouth daily.   . triamcinolone (NASACORT ALLERGY 24HR) 55 MCG/ACT AERO nasal inhaler Place 1 spray into the nose daily.  . UNABLE TO FIND Diet Type: Regular. No Sodium Restriction. Gatorade on lunch and supper trays for hyponatremia  . venlafaxine XR (EFFEXOR-XR) 75 MG 24 hr capsule Take 75 mg by mouth daily with breakfast.   No facility-administered encounter medications on file as of 01/07/2018.      SIGNIFICANT DIAGNOSTIC EXAMS   LABS REVIEWED: PREVIOUS   04-24-17: wbc 5.8; hgb 11.4; hct 33.7; mcv 95.2; plt 196 glucose 96; bun 27; creat 0.97; k+ 4.0; na++ 134; ca 9.2; liver normal albumin 3.3 chol 177; ldl 121; trig 86 hdl 39; mag 1.8 tsh 7.731;  Vit B 12 329; vit D 41.0;  04-25-17: depakote free 7.3  06-05-17: tsh 5.209; depakote free 5.9 08-21-17: wbc 11.8; hgb 10.0; hct 29.4; mcv 92.2; plt 278; glucose 94; bun 13; creat 1.01; k+ 3.7; na++ 128; ca 9.1; alk phos 229; albumin 2.4  10-17-17: tsh 7.780 11-28-17:  tsh 6.379   NO NEW LABS.    Review of Systems  Constitutional: Negative for malaise/fatigue.  Respiratory: Negative for cough and shortness of breath.   Cardiovascular: Negative for chest pain, palpitations and leg swelling.  Gastrointestinal: Negative for abdominal pain, constipation and heartburn.  Musculoskeletal: Negative for back pain, joint pain and myalgias.  Skin: Negative.   Neurological: Negative for dizziness.  Psychiatric/Behavioral: The patient is not nervous/anxious.      Physical Exam Constitutional:      General: She is not in acute distress.    Appearance: She is well-developed. She is   not diaphoretic.  Neck:     Musculoskeletal: Neck supple.     Thyroid: No thyromegaly.  Cardiovascular:     Rate and Rhythm: Normal rate and regular rhythm.     Pulses: Normal pulses.     Heart  sounds: Normal heart sounds.  Pulmonary:     Effort: Pulmonary effort is normal. No respiratory distress.     Breath sounds: Normal breath sounds.  Abdominal:     General: Bowel sounds are normal. There is no distension.     Palpations: Abdomen is soft.     Tenderness: There is no abdominal tenderness.  Musculoskeletal: Normal range of motion.     Right lower leg: No edema.     Left lower leg: No edema.     Comments: Is able to move all extremities  AFO left foot Left hand splint     Lymphadenopathy:     Cervical: No cervical adenopathy.  Skin:    General: Skin is warm and dry.  Neurological:     Mental Status: She is alert. Mental status is at baseline.  Psychiatric:        Mood and Affect: Mood normal.        ASSESSMENT/ PLAN:   TODAY:   1.  Cerebral artery occlusion with cerebral infarction: is neurologically stable; will continue aggrenox twice daily   2. Essential hypertension, benign: is stable b/p 127/62: will continue coreg 3.125 mg twice daily minipress 3 mg nightly; aldactone 50 mg daily   3. Allergic rhinitis: is stable will continue zyrtec 5 mg daily singulair 10 mg daily nasocort daily   4. Uncomplicated asthma: is stable will continue mucomyst neb daily mucinex 1200 mg twice  breo daily; duoneb every 6 hours as needed  Will stop atrovent nasal spray and will moniotor her status.   PREVIOUS   5. Gastroesophageal reflux disease without esophagitis: stable prilosec 20 mg twice  daily   6. Hypothyroidism due to acquired atrophy of thyroid: stable tsh 6.379 will continue synthroid 75 mcg daily lab work pending.   7.  Overactive bladder: is stable will continue ditropan 5 mg twice daily   8.  Major depression, single episode: stable will continue effexor xr 75 mg daily depakote 250 mg twice daily stabilize mood   9. Chronic constipation: stable will continue miralax daily  senna s nightly and 2 tabs twice daily as needed   10. Bilateral lower extremity  edema: is stable will continue demadex 20 mg daily   11. Bilateral chronic knee pain: stable will monitor  12. SIADH: is stable will continue declomycin 300 mg twice daily NACL 1 gm daily and gatorade twice daily    Will check cbc; cmp     MD is aware of resident's narcotic use and is in agreement with current plan of care. We will attempt to wean resident as apropriate   Deborah Green NP [facility]  Contact [phone] Monday through Friday 8am- 5pm  After hours call [phone]   

## 2018-01-08 ENCOUNTER — Other Ambulatory Visit
Admission: RE | Admit: 2018-01-08 | Discharge: 2018-01-08 | Disposition: A | Discharge status: Home / Self Care | Payer: PPO | Source: Ambulatory Visit | Attending: Adult Health | Admitting: Adult Health

## 2018-01-08 DIAGNOSIS — E222 Syndrome of inappropriate secretion of antidiuretic hormone: Secondary | ICD-10-CM | POA: Insufficient documentation

## 2018-01-08 DIAGNOSIS — E041 Nontoxic single thyroid nodule: Secondary | ICD-10-CM | POA: Diagnosis not present

## 2018-01-08 DIAGNOSIS — M19012 Primary osteoarthritis, left shoulder: Secondary | ICD-10-CM | POA: Diagnosis not present

## 2018-01-08 DIAGNOSIS — I69398 Other sequelae of cerebral infarction: Secondary | ICD-10-CM | POA: Diagnosis not present

## 2018-01-08 DIAGNOSIS — I69354 Hemiplegia and hemiparesis following cerebral infarction affecting left non-dominant side: Secondary | ICD-10-CM | POA: Diagnosis not present

## 2018-01-08 DIAGNOSIS — M6281 Muscle weakness (generalized): Secondary | ICD-10-CM | POA: Diagnosis not present

## 2018-01-08 DIAGNOSIS — M24542 Contracture, left hand: Secondary | ICD-10-CM | POA: Diagnosis not present

## 2018-01-08 LAB — CBC WITH DIFFERENTIAL/PLATELET
ABS IMMATURE GRANULOCYTES: 0.03 10*3/uL (ref 0.00–0.07)
Basophils Absolute: 0 10*3/uL (ref 0.0–0.1)
Basophils Relative: 1 %
Eosinophils Absolute: 0.2 10*3/uL (ref 0.0–0.5)
Eosinophils Relative: 4 %
HCT: 34.9 % — ABNORMAL LOW (ref 36.0–46.0)
Hemoglobin: 10.9 g/dL — ABNORMAL LOW (ref 12.0–15.0)
Immature Granulocytes: 1 %
Lymphocytes Relative: 33 %
Lymphs Abs: 1.9 10*3/uL (ref 0.7–4.0)
MCH: 29.9 pg (ref 26.0–34.0)
MCHC: 31.2 g/dL (ref 30.0–36.0)
MCV: 95.6 fL (ref 80.0–100.0)
Monocytes Absolute: 0.5 10*3/uL (ref 0.1–1.0)
Monocytes Relative: 8 %
Neutro Abs: 3.1 10*3/uL (ref 1.7–7.7)
Neutrophils Relative %: 53 %
Platelets: 203 10*3/uL (ref 150–400)
RBC: 3.65 MIL/uL — ABNORMAL LOW (ref 3.87–5.11)
RDW: 13.2 % (ref 11.5–15.5)
WBC: 5.8 10*3/uL (ref 4.0–10.5)
nRBC: 0 % (ref 0.0–0.2)

## 2018-01-08 LAB — COMPREHENSIVE METABOLIC PANEL
ALBUMIN: 3 g/dL — AB (ref 3.5–5.0)
ALT: 66 U/L — AB (ref 0–44)
AST: 25 U/L (ref 15–41)
Alkaline Phosphatase: 162 U/L — ABNORMAL HIGH (ref 38–126)
Anion gap: 5 (ref 5–15)
BUN: 14 mg/dL (ref 8–23)
CO2: 30 mmol/L (ref 22–32)
CREATININE: 0.78 mg/dL (ref 0.44–1.00)
Calcium: 9.7 mg/dL (ref 8.9–10.3)
Chloride: 103 mmol/L (ref 98–111)
GFR calc Af Amer: >60 mL/min (ref >60)
GFR calc non Af Amer: >60 mL/min (ref >60)
Glucose, Bld: 86 mg/dL (ref 70–99)
Potassium: 3.8 mmol/L (ref 3.5–5.1)
Sodium: 138 mmol/L (ref 135–145)
Total Bilirubin: 0.5 mg/dL (ref 0.3–1.2)
Total Protein: 6.1 g/dL — ABNORMAL LOW (ref 6.5–8.1)

## 2018-01-08 LAB — TSH: TSH: 2.887 u[IU]/mL (ref 0.350–4.500)

## 2018-01-08 LAB — VALPROIC ACID LEVEL: Valproic Acid Lvl: 36 ug/mL — ABNORMAL LOW (ref 50.0–100.0)

## 2018-01-12 ENCOUNTER — Non-Acute Institutional Stay (SKILLED_NURSING_FACILITY): Payer: PPO | Admitting: Adult Health

## 2018-01-12 ENCOUNTER — Encounter: Payer: Self-pay | Admitting: Adult Health

## 2018-01-12 DIAGNOSIS — F329 Major depressive disorder, single episode, unspecified: Secondary | ICD-10-CM | POA: Diagnosis not present

## 2018-01-12 DIAGNOSIS — G629 Polyneuropathy, unspecified: Secondary | ICD-10-CM

## 2018-01-12 NOTE — Progress Notes (Signed)
Location:   [facility] Room Number: [place] of Service:  SNF (31)   CODE STATUS: Full Code  Allergies  Allergen Reactions  . Tizanidine Other (See Comments)    HYPOTENSION AND UNRESPONSIVENESS  . Ace Inhibitors Cough  . Latex Hives  . Lisinopril Cough  . Tetracycline Other (See Comments)     REACTION: yeast  . Zanaflex [Tizanidine Hcl] Other (See Comments)    "drowsy"    Chief Complaint  Patient presents with  . Acute Visit    Cut areas between toes    HPI:  Staff reports that she has cut areas in between toes. There are no open areas present. She is complaining of worsening neuropathic pain in her legs which is not getting enough pain relief. She denies any worsening edema. She is able to sleep at night.    Past Medical History:  Diagnosis Date  . Arthritis   . Asthma   . 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 Depression with anxiety   . GERD (gastroesophageal reflux disease)   . Hemiparesis affecting left side as late effect of cerebrovascular accident (CVA) ([place])   . Hot flashes   . Hyperlipidemia   . Hypertension   . Muscle hypertonicity   . Osteoarthritis   . Stroke ([place])   . TIA (transient ischemic attack)     Past Surgical History:  Procedure Laterality Date  . ACHILLES TENDON LENGTHENING  08/25/2015   Procedure: ACHILLES TENDON LENGTHENING;  Surgeon: Samara Deist, DPM;  Location: [facility] ORS;  Service: Podiatry;;  . APPENDECTOMY    . COLONOSCOPY  06/17/2008  . EYE SURGERY Bilateral    Cataract Extraction with IOL  . FLAT FOOT RECONSTRUCTION-TAL GASTROC RECESSION Left 08/25/2015   Procedure: ENDOSCOPIC FLAT FOOT RECONSTRUCTION-TAL GASTROC RECESSION;  Surgeon: Samara Deist, DPM;  Location: [facility] ORS;  Service: Podiatry;  Laterality: Left;  Marland Kitchen VAGINAL HYSTERECTOMY      Social History   Socioeconomic History  . Marital status: Married    Spouse name: Not on file  . Number of children: Not on file  . Years of  education: Not on file  . Highest education level: Not on file  Occupational History  . Occupation: retired  Scientific laboratory technician  . Financial resource strain: Not on file  . Food insecurity:    Worry: Not on file    Inability: Not on file  . Transportation needs:    Medical: Not on file    Non-medical: Not on file  Tobacco Use  . Smoking status: Former Smoker    Packs/day: 0.50    Years: 3.00    Pack years: 1.50    Types: Cigarettes    Last attempt to quit: 01/08/1963    Years since quitting: 55.0  . Smokeless tobacco: Never Used  Substance and Sexual Activity  . Alcohol use: No    Alcohol/week: 0.0 standard drinks    Comment: does not drink alcohol  . Drug use: No  . Sexual activity: Not on file  Lifestyle  . Physical activity:    Days per week: Not on file    Minutes per session: Not on file  . Stress: Not on file  Relationships  . Social connections:    Talks on phone: Not on file    Gets together: Not on file    Attends religious service: Not on file    Active member of club or organization: Not on file  Attends meetings of clubs or organizations: Not on file    Relationship status: Not on file  . Intimate partner violence:    Fear of current or ex partner: Not on file    Emotionally abused: Not on file    Physically abused: Not on file    Forced sexual activity: Not on file  Other Topics Concern  . Not on file  Social History Narrative   Admitted to [facility] 11/30/2014   Married   Former smoker   Full Code   Family History  Problem Relation Age of Onset  . Alcohol abuse Mother   . Coronary artery disease Mother   . Stroke Mother   . Liver cancer Father   . Alcohol abuse Brother   . Coronary artery disease Brother   . Diabetes type II Brother   . Liver cancer Brother       VITAL SIGNS BP 127/62   Pulse 79   Temp 98.3 F (36.8 C)   Resp 15   Ht 5' 1"  (1.549 m)   Wt 178 lb 3.2 oz (80.8 kg)   SpO2 94%   BMI 33.67 kg/m   Outpatient  Encounter Medications as of 01/12/2018  Medication Sig  . acetaminophen (TYLENOL) 325 MG tablet Take 650 mg by mouth every 6 (six) hours as needed. for pain/ increased temp. May be administered orally, per G-tube if needed or rectally if unable to swallow (separate order). Maximum dose for 24 hours is 3,000 mg from all sources of Acetaminophen/ Tylenol  . alum & mag hydroxide-simeth (MAALOX PLUS) 400-400-40 MG/5ML suspension Take 30 mLs by mouth every 4 (four) hours as needed for indigestion.   . Artificial Saliva (BIOTENE MOISTURIZING MOUTH MT) Use as directed 2 sprays in the mouth or throat 4 (four) times daily as needed.  . Calcium Carbonate-Vitamin D (CALCIUM 600+D) 600-400 MG-UNIT tablet Take 1 tablet by mouth 2 (two) times daily.  . Carboxymeth-Glycerin-Polysorb (REFRESH OPTIVE ADVANCED) 0.5-1-0.5 % SOLN Place 2 drops into both eyes 2 (two) times daily.  . carvedilol (COREG) 3.125 MG tablet Take 3.125 mg by mouth 2 (two) times daily.   . cetirizine (ZYRTEC) 5 MG tablet Take 5 mg by mouth at bedtime.  . cyanocobalamin 1000 MCG tablet Take 1,000 mcg by mouth daily.  Marland Kitchen demeclocycline (DECLOMYCIN) 300 MG tablet Take 300 mg by mouth 2 (two) times daily.   Marland Kitchen dextromethorphan (DELSYM) 30 MG/5ML liquid Take 10 mLs by mouth every 12 (twelve) hours as needed for cough.  . dipyridamole-aspirin (AGGRENOX) 200-25 MG 12hr capsule Take 1 capsule by mouth every 12 (twelve) hours.   . divalproex (DEPAKOTE SPRINKLE) 125 MG capsule Take 250 mg by mouth 2 (two) times daily. 2 caps  . fluticasone furoate-vilanterol (BREO ELLIPTA) 100-25 MCG/INH AEPB Inhale 1 puff into the lungs daily.   Marland Kitchen gabapentin (NEURONTIN) 400 MG capsule Take 400 mg by mouth 3 (three) times daily.  Marland Kitchen guaiFENesin (MUCINEX) 600 MG 12 hr tablet Take 1,200 mg by mouth every 12 (twelve) hours.   Marland Kitchen ipratropium-albuterol (DUONEB) 0.5-2.5 (3) MG/3ML SOLN Take 3 mLs by nebulization every 6 (six) hours as needed.   Marland Kitchen levothyroxine (SYNTHROID,  LEVOTHROID) 75 MCG tablet Take 75 mcg by mouth daily before breakfast.   . magnesium hydroxide (MILK OF MAGNESIA) 400 MG/5ML suspension Take 30 mLs by mouth every 4 (four) hours as needed for mild constipation.  . Menthol (HALLS COUGH DROPS MT) Take 3.2 mg by mouth as needed   . Menthol,  Topical Analgesic, 4 % GEL Apply 1 application topically 2 (two) times daily. Apply to bilateral lower extremities  . montelukast (SINGULAIR) 10 MG tablet Take 10 mg by mouth at bedtime.   Marland Kitchen omeprazole (PRILOSEC) 20 MG capsule Take 20 mg by mouth 2 (two) times daily.   Marland Kitchen oxybutynin (DITROPAN) 5 MG tablet Take 5 mg by mouth 2 (two) times daily.   . OXYGEN Inhale 2 L into the lungs every 4 (four) hours as needed. for dyspnea, Check O2 saturations prior to placing on patient and at least every 4 hours after applying. Notify MD if oxygen saturations drop below baseline while receiving oxygen or no improvement in dyspnea  . polyethylene glycol (MIRALAX / GLYCOLAX) packet Take 17 g by mouth daily. dx: constipation  Hold if loose stool  . prazosin (MINIPRESS) 1 MG capsule Take 3 mg by mouth at bedtime. 3 capsules  . Salicylic Acid 40 % PADS Apply 1 corn pad to Right 5th toe daily until corn resolved  . sennosides-docusate sodium (SENOKOT-S) 8.6-50 MG tablet Take 2 tablets by mouth 2 (two) times daily as needed for constipation. Also take 1 tab by mouth at bedtime daily.  . Skin Protectants, Misc. (ENDIT EX) Apply liberal amount topically to areas of skin irritation as needed. Ok to leave at bedside.  . sodium chloride 1 g tablet Take 1 g by mouth daily.   Marland Kitchen spironolactone (ALDACTONE) 50 MG tablet Take 50 mg by mouth daily.  Marland Kitchen torsemide (DEMADEX) 10 MG tablet Take 10 mg by mouth daily.   Marland Kitchen triamcinolone (NASACORT ALLERGY 24HR) 55 MCG/ACT AERO nasal inhaler Place 1 spray into the nose daily.  Marland Kitchen UNABLE TO FIND Diet Type: Regular. No Sodium Restriction. Gatorade on lunch and supper trays for hyponatremia  . venlafaxine XR  (EFFEXOR-XR) 75 MG 24 hr capsule Take 75 mg by mouth daily with breakfast.  . [DISCONTINUED] acetylcysteine (MUCOMYST) 10 % nebulizer solution Take 10 mLs by nebulization daily. Give after AM Duoneb for assistance with mucus expectoration  . [DISCONTINUED] ipratropium (ATROVENT) 0.03 % nasal spray Place 2 sprays into both nostrils at bedtime.    No facility-administered encounter medications on file as of 01/12/2018.      SIGNIFICANT DIAGNOSTIC EXAMS  LABS REVIEWED: PREVIOUS   04-24-17: wbc 5.8; hgb 11.4; hct 33.7; mcv 95.2; plt 196 glucose 96; bun 27; creat 0.97; k+ 4.0; na++ 134; ca 9.2; liver normal albumin 3.3 chol 177; ldl 121; trig 86 hdl 39; mag 1.8 tsh 7.731;  Vit B 12 329; vit D 41.0;  04-25-17: depakote free 7.3  06-05-17: tsh 5.209; depakote free 5.9 08-21-17: wbc 11.8; hgb 10.0; hct 29.4; mcv 92.2; plt 278; glucose 94; bun 13; creat 1.01; k+ 3.7; na++ 128; ca 9.1; alk phos 229; albumin 2.4  10-17-17: tsh 7.780 11-28-17:  tsh 6.379   TODAY:   01-08-18; wbc 5.8; hgb 10.9; hct 34.9; mcv 95.6; plt 203; glucose 86; bun 14; creat 0.78; k+ 3.8; na++ 138; ca 9.7; alt 66; alk phos 162; albumin 3.0   Review of Systems  Constitutional: Negative for malaise/fatigue.  Respiratory: Negative for cough and shortness of breath.   Cardiovascular: Negative for chest pain, palpitations and leg swelling.  Gastrointestinal: Negative for abdominal pain, constipation and heartburn.  Musculoskeletal: Negative for back pain, joint pain and myalgias.       Has bilateral lower extremity neuropathic pain   Skin: Negative.   Neurological: Negative for dizziness.  Psychiatric/Behavioral: The patient is not nervous/anxious.  Physical Exam Constitutional:      General: She is not in acute distress.    Appearance: Normal appearance. She is well-developed. She is not diaphoretic.  Neck:     Musculoskeletal: Neck supple.     Thyroid: No thyromegaly.  Cardiovascular:     Rate and Rhythm: Normal rate  and regular rhythm.     Pulses: Normal pulses.     Heart sounds: Normal heart sounds.  Pulmonary:     Effort: Pulmonary effort is normal. No respiratory distress.     Breath sounds: Normal breath sounds.  Abdominal:     General: Bowel sounds are normal. There is no distension.     Palpations: Abdomen is soft.     Tenderness: There is no abdominal tenderness.  Musculoskeletal:     Right lower leg: No edema.     Left lower leg: No edema.     Comments: Is able to move all extremities  AFO left foot Left hand splint      Lymphadenopathy:     Cervical: No cervical adenopathy.  Skin:    General: Skin is warm and dry.  Neurological:     Mental Status: She is alert. Mental status is at baseline.  Psychiatric:        Mood and Affect: Mood normal.     ASSESSMENT/ PLAN:   TODAY:   1.  Neuropathy 2. Major depression single episode  Will increase neurontin to 900 mg three times daily  Will lower her depakote to 125 mg twice daily due to her liver function.     MD is aware of resident's narcotic use and is in agreement with current plan of care. We will attempt to wean resident as apropriate   Ok Edwards NP [facility]  Contact [phone] Monday through Friday 8am- 5pm  After hours call [phone]

## 2018-01-19 ENCOUNTER — Encounter: Payer: Self-pay | Admitting: Adult Health

## 2018-01-19 ENCOUNTER — Non-Acute Institutional Stay (SKILLED_NURSING_FACILITY): Payer: PPO | Admitting: Adult Health

## 2018-01-19 DIAGNOSIS — J189 Pneumonia, unspecified organism: Secondary | ICD-10-CM

## 2018-01-19 NOTE — Progress Notes (Signed)
[facility]:   [facility] Room Number: [place] of Service:  SNF (31)   CODE STATUS: Full Code  Allergies  Allergen Reactions  . Tizanidine Other (See Comments)    HYPOTENSION AND UNRESPONSIVENESS  . Ace Inhibitors Cough  . Latex Hives  . Lisinopril Cough  . Tetracycline Other (See Comments)     REACTION: yeast  . Zanaflex [Tizanidine Hcl] Other (See Comments)    "drowsy"    Chief Complaint  Patient presents with  . Acute Visit    Congestion    HPI:  For the past several days she has had a cough. She has developed yellow sputum low 02 sats requiring 02 at 2L. She is short of breath; there are no reports of fevers present. She denies any chest pain.   Past Medical History:  Diagnosis Date  . Arthritis   . Asthma   . 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 Depression with anxiety   . GERD (gastroesophageal reflux disease)   . Hemiparesis affecting left side as late effect of cerebrovascular accident (CVA) ([place])   . Hot flashes   . Hyperlipidemia   . Hypertension   . Muscle hypertonicity   . Osteoarthritis   . Stroke ([place])   . TIA (transient ischemic attack)     Past Surgical History:  Procedure Laterality Date  . ACHILLES TENDON LENGTHENING  08/25/2015   Procedure: ACHILLES TENDON LENGTHENING;  Surgeon: Samara Deist, DPM;  Location: [facility] ORS;  Service: Podiatry;;  . APPENDECTOMY    . COLONOSCOPY  06/17/2008  . EYE SURGERY Bilateral    Cataract Extraction with IOL  . FLAT FOOT RECONSTRUCTION-TAL GASTROC RECESSION Left 08/25/2015   Procedure: ENDOSCOPIC FLAT FOOT RECONSTRUCTION-TAL GASTROC RECESSION;  Surgeon: Samara Deist, DPM;  Location: [facility] ORS;  Service: Podiatry;  Laterality: Left;  Marland Kitchen VAGINAL HYSTERECTOMY      Social History   Socioeconomic History  . Marital status: Married    Spouse name: Not on file  . Number of children: Not on file  . Years of education: Not on file  . Highest education level: Not on file    Occupational History  . Occupation: retired  Scientific laboratory technician  . Financial resource strain: Not on file  . Food insecurity:    Worry: Not on file    Inability: Not on file  . Transportation needs:    Medical: Not on file    Non-medical: Not on file  Tobacco Use  . Smoking status: Former Smoker    Packs/day: 0.50    Years: 3.00    Pack years: 1.50    Types: Cigarettes    Last attempt to quit: 01/08/1963    Years since quitting: 55.0  . Smokeless tobacco: Never Used  Substance and Sexual Activity  . Alcohol use: No    Alcohol/week: 0.0 standard drinks    Comment: does not drink alcohol  . Drug use: No  . Sexual activity: Not on file  Lifestyle  . Physical activity:    Days per week: Not on file    Minutes per session: Not on file  . Stress: Not on file  Relationships  . Social connections:    Talks on phone: Not on file    Gets together: Not on file    Attends religious service: Not on file    Active member of club or organization: Not on file    Attends meetings of clubs or organizations: Not on  file    Relationship status: Not on file  . Intimate partner violence:    Fear of current or ex partner: Not on file    Emotionally abused: Not on file    Physically abused: Not on file    Forced sexual activity: Not on file  Other Topics Concern  . Not on file  Social History Narrative   Admitted to [facility] [place] 11/30/2014   Married   Former smoker   Full Code   Family History  Problem Relation Age of Onset  . Alcohol abuse Mother   . Coronary artery disease Mother   . Stroke Mother   . Liver cancer Father   . Alcohol abuse Brother   . Coronary artery disease Brother   . Diabetes type II Brother   . Liver cancer Brother       VITAL SIGNS BP 132/72   Pulse 81   Temp 98.2 F (36.8 C)   Resp 17   Ht _0  (1.549 m)   Wt 178 lb 3.2 oz (80.8 kg)   SpO2 91%   BMI 33.67 kg/m   Outpatient Encounter Medications as of 01/19/2018  Medication Sig  .  acetaminophen (TYLENOL) 325 MG tablet Take 650 mg by mouth every 6 (six) hours as needed. for pain/ increased temp. May be administered orally, per G-tube if needed or rectally if unable to swallow (separate order). Maximum dose for 24 hours is 3,000 mg from all sources of Acetaminophen/ Tylenol  . alum & mag hydroxide-simeth (MAALOX PLUS) 400-400-40 MG/5ML suspension Take 30 mLs by mouth every 4 (four) hours as needed for indigestion.   . Artificial Saliva (BIOTENE MOISTURIZING MOUTH MT) Use as directed 2 sprays in the mouth or throat 4 (four) times daily as needed.  . Calcium Carbonate-Vitamin D (CALCIUM 600+D) 600-400 MG-UNIT tablet Take 1 tablet by mouth 2 (two) times daily.  . Carboxymeth-Glycerin-Polysorb (REFRESH OPTIVE ADVANCED) 0.5-1-0.5 % SOLN Place 2 drops into both eyes 2 (two) times daily.  . carvedilol (COREG) 3.125 MG tablet Take 3.125 mg by mouth 2 (two) times daily.   . cetirizine (ZYRTEC) 5 MG tablet Take 5 mg by mouth at bedtime.  . cyanocobalamin 1000 MCG tablet Take 1,000 mcg by mouth daily.  Marland Kitchen demeclocycline (DECLOMYCIN) 300 MG tablet Take 300 mg by mouth 2 (two) times daily.   Marland Kitchen dextromethorphan (DELSYM) 30 MG/5ML liquid Take 10 mLs by mouth every 12 (twelve) hours as needed for cough.  . dipyridamole-aspirin (AGGRENOX) 200-25 MG 12hr capsule Take 1 capsule by mouth every 12 (twelve) hours.   . divalproex (DEPAKOTE SPRINKLE) 125 MG capsule Take 125 mg by mouth 2 (two) times daily.   . fluticasone furoate-vilanterol (BREO ELLIPTA) 100-25 MCG/INH AEPB Inhale 1 puff into the lungs daily.   Marland Kitchen gabapentin (NEURONTIN) 600 MG tablet Take 600 mg by mouth 3 (three) times daily.   Marland Kitchen guaiFENesin (MUCINEX) 600 MG 12 hr tablet Take 1,200 mg by mouth every 12 (twelve) hours.   Marland Kitchen ipratropium-albuterol (DUONEB) 0.5-2.5 (3) MG/3ML SOLN Take 3 mLs by nebulization every 6 (six) hours as needed.   Marland Kitchen levothyroxine (SYNTHROID, LEVOTHROID) 75 MCG tablet Take 75 mcg by mouth daily before breakfast.     . magnesium hydroxide (MILK OF MAGNESIA) 400 MG/5ML suspension Take 30 mLs by mouth every 4 (four) hours as needed for mild constipation.  . Menthol (HALLS COUGH DROPS MT) Take 3.2 mg by mouth as needed   . Menthol, Topical Analgesic, 4 % GEL Apply 1  application topically 2 (two) times daily. Apply to bilateral lower extremities  . montelukast (SINGULAIR) 10 MG tablet Take 10 mg by mouth at bedtime.   Marland Kitchen omeprazole (PRILOSEC) 20 MG capsule Take 20 mg by mouth 2 (two) times daily.   Marland Kitchen oxybutynin (DITROPAN) 5 MG tablet Take 5 mg by mouth 2 (two) times daily.   . OXYGEN Inhale 2 L into the lungs every 4 (four) hours as needed. for dyspnea, Check O2 saturations prior to placing on patient and at least every 4 hours after applying. Notify MD if oxygen saturations drop below baseline while receiving oxygen or no improvement in dyspnea  . polyethylene glycol (MIRALAX / GLYCOLAX) packet Take 17 g by mouth daily. dx: constipation  Hold if loose stool  . prazosin (MINIPRESS) 1 MG capsule Take 3 mg by mouth at bedtime. 3 capsules  . Salicylic Acid 40 % PADS Apply 1 corn pad to Right 5th toe daily until corn resolved  . sennosides-docusate sodium (SENOKOT-S) 8.6-50 MG tablet Take 2 tablets by mouth 2 (two) times daily as needed for constipation. Also take 1 tab by mouth at bedtime daily.  . Skin Protectants, Misc. (ENDIT EX) Apply liberal amount topically to areas of skin irritation as needed. Ok to leave at bedside.  . sodium chloride 1 g tablet Take 1 g by mouth daily.   Marland Kitchen spironolactone (ALDACTONE) 50 MG tablet Take 50 mg by mouth daily.  Marland Kitchen torsemide (DEMADEX) 10 MG tablet Take 10 mg by mouth daily.   Marland Kitchen triamcinolone (NASACORT ALLERGY 24HR) 55 MCG/ACT AERO nasal inhaler Place 1 spray into the nose daily.  Marland Kitchen UNABLE TO FIND Diet Type: Regular. No Sodium Restriction. Gatorade on lunch and supper trays for hyponatremia  . venlafaxine XR (EFFEXOR-XR) 75 MG 24 hr capsule Take 75 mg by mouth daily with breakfast.    No facility-administered encounter medications on file as of 01/19/2018.      SIGNIFICANT DIAGNOSTIC EXAMS  LABS REVIEWED: PREVIOUS   04-24-17: wbc 5.8; hgb 11.4; hct 33.7; mcv 95.2; plt 196 glucose 96; bun 27; creat 0.97; k+ 4.0; na++ 134; ca 9.2; liver normal albumin 3.3 chol 177; ldl 121; trig 86 hdl 39; mag 1.8 tsh 7.731;  Vit B 12 329; vit D 41.0;  04-25-17: depakote free 7.3  06-05-17: tsh 5.209; depakote free 5.9 08-21-17: wbc 11.8; hgb 10.0; hct 29.4; mcv 92.2; plt 278; glucose 94; bun 13; creat 1.01; k+ 3.7; na++ 128; ca 9.1; alk phos 229; albumin 2.4  10-17-17: tsh 7.780 11-28-17:  tsh 6.379  01-08-18; wbc 5.8; hgb 10.9; hct 34.9; mcv 95.6; plt 203; glucose 86; bun 14; creat 0.78; k+ 3.8; na++ 138; ca 9.7; alt 66; alk phos 162; albumin 3.0   NO NEW LABS.   Review of Systems  Constitutional: Positive for fever and malaise/fatigue.  HENT: Negative for congestion.   Respiratory: Positive for cough, sputum production and shortness of breath.   Cardiovascular: Negative for chest pain, palpitations and leg swelling.  Gastrointestinal: Negative for abdominal pain, constipation and heartburn.  Musculoskeletal: Negative for back pain, joint pain and myalgias.  Skin: Negative.   Neurological: Negative for dizziness.  Psychiatric/Behavioral: The patient is not nervous/anxious.       Physical Exam Constitutional:      General: She is not in acute distress.    Appearance: Normal appearance. She is well-developed. She is not diaphoretic.  Neck:     Musculoskeletal: Neck supple.     Thyroid: No thyromegaly.  Cardiovascular:  Rate and Rhythm: Normal rate and regular rhythm.     Heart sounds: Normal heart sounds.  Pulmonary:     Effort: Pulmonary effort is normal. No respiratory distress.     Breath sounds: Rhonchi and rales present.     Comments: Left lower lobe  Abdominal:     General: Bowel sounds are normal. There is no distension.     Palpations: Abdomen is soft.      Tenderness: There is no abdominal tenderness.  Musculoskeletal:     Right lower leg: No edema.     Left lower leg: No edema.     Comments:  Is able to move all extremities  AFO left foot Left hand splint   Lymphadenopathy:     Cervical: No cervical adenopathy.  Skin:    General: Skin is warm and dry.  Neurological:     Mental Status: She is alert. Mental status is at baseline.  Psychiatric:        Mood and Affect: Mood normal.        ASSESSMENT/ PLAN:   TODAY:   1. Left lower lobe pneumonia: is worse: will continue 02 at 2L to keep sats >91%. Will begin augmentin 875 mg twice daily through 01/29/18 and zithromax 500 mg daily through 01/26/18 with a probiotic three times daily through 02/02/18 Will get chest x-ray.       MD is aware of resident's narcotic use and is in agreement with current plan of care. We will attempt to wean resident as apropriate   Ok Edwards NP [facility]  Contact [phone] Monday through Friday 8am- 5pm  After hours call [phone]

## 2018-01-20 DIAGNOSIS — J811 Chronic pulmonary edema: Secondary | ICD-10-CM | POA: Diagnosis not present

## 2018-01-20 DIAGNOSIS — R0602 Shortness of breath: Secondary | ICD-10-CM | POA: Diagnosis not present

## 2018-02-03 ENCOUNTER — Other Ambulatory Visit: Payer: Self-pay

## 2018-02-03 MED ORDER — RISA-BID PROBIOTIC PO TABS: 1.0000 | ORAL_TABLET | Freq: Three times a day (TID) | ORAL | 12 refills | Status: DC

## 2018-02-04 ENCOUNTER — Non-Acute Institutional Stay (SKILLED_NURSING_FACILITY): Payer: PPO | Admitting: Adult Health

## 2018-02-04 ENCOUNTER — Encounter: Payer: Self-pay | Admitting: Adult Health

## 2018-02-04 DIAGNOSIS — I6350 Cerebral infarction due to unspecified occlusion or stenosis of unspecified cerebral artery: Secondary | ICD-10-CM | POA: Diagnosis not present

## 2018-02-04 DIAGNOSIS — I69354 Hemiplegia and hemiparesis following cerebral infarction affecting left non-dominant side: Secondary | ICD-10-CM | POA: Diagnosis not present

## 2018-02-04 NOTE — Progress Notes (Signed)
Location:    [facility] Room Number: [place] of Service:  SNF (31)   CODE STATUS: Full Code  Allergies  Allergen Reactions  . Tizanidine Other (See Comments)    HYPOTENSION AND UNRESPONSIVENESS  . Ace Inhibitors Cough  . Latex Hives  . Lisinopril Cough  . Tetracycline Other (See Comments)     REACTION: yeast  . Zanaflex [Tizanidine Hcl] Other (See Comments)    "drowsy"    Chief Complaint  Patient presents with  . Acute Visit    Care Plan Meeting    HPI:  We have come together for her routine care plan meeting; she does not have family present. She is working with OT for her left upper extremity contracture. She has not had any falls; no depressive thoughts or anxiety. She has lost weight over the past 6 months from 186 pounds to 178 pounds. She did have complaints about the food; no nursing concerns.    Past Medical History:  Diagnosis Date  . Arthritis   . Asthma   . 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 Depression with anxiety   . GERD (gastroesophageal reflux disease)   . Hemiparesis affecting left side as late effect of cerebrovascular accident (CVA) ([place])   . Hot flashes   . Hyperlipidemia   . Hypertension   . Muscle hypertonicity   . Osteoarthritis   . Stroke ([place])   . TIA (transient ischemic attack)     Past Surgical History:  Procedure Laterality Date  . ACHILLES TENDON LENGTHENING  08/25/2015   Procedure: ACHILLES TENDON LENGTHENING;  Surgeon: Samara Deist, DPM;  Location: [facility] ORS;  Service: Podiatry;;  . APPENDECTOMY    . COLONOSCOPY  06/17/2008  . EYE SURGERY Bilateral    Cataract Extraction with IOL  . FLAT FOOT RECONSTRUCTION-TAL GASTROC RECESSION Left 08/25/2015   Procedure: ENDOSCOPIC FLAT FOOT RECONSTRUCTION-TAL GASTROC RECESSION;  Surgeon: Samara Deist, DPM;  Location: [facility] ORS;  Service: Podiatry;  Laterality: Left;  Marland Kitchen VAGINAL HYSTERECTOMY      Social History   Socioeconomic History  .  Marital status: Married    Spouse name: Not on file  . Number of children: Not on file  . Years of education: Not on file  . Highest education level: Not on file  Occupational History  . Occupation: retired  Scientific laboratory technician  . Financial resource strain: Not on file  . Food insecurity:    Worry: Not on file    Inability: Not on file  . Transportation needs:    Medical: Not on file    Non-medical: Not on file  Tobacco Use  . Smoking status: Former Smoker    Packs/day: 0.50    Years: 3.00    Pack years: 1.50    Types: Cigarettes    Last attempt to quit: 01/08/1963    Years since quitting: 55.1  . Smokeless tobacco: Never Used  Substance and Sexual Activity  . Alcohol use: No    Alcohol/week: 0.0 standard drinks    Comment: does not drink alcohol  . Drug use: No  . Sexual activity: Not on file  Lifestyle  . Physical activity:    Days per week: Not on file    Minutes per session: Not on file  . Stress: Not on file  Relationships  . Social connections:    Talks on phone: Not on file    Gets together: Not on file    Attends religious  service: Not on file    Active member of club or organization: Not on file    Attends meetings of clubs or organizations: Not on file    Relationship status: Not on file  . Intimate partner violence:    Fear of current or ex partner: Not on file    Emotionally abused: Not on file    Physically abused: Not on file    Forced sexual activity: Not on file  Other Topics Concern  . Not on file  Social History Narrative   Admitted to [facility] [place] 11/30/2014   Married   Former smoker   Full Code   Family History  Problem Relation Age of Onset  . Alcohol abuse Mother   . Coronary artery disease Mother   . Stroke Mother   . Liver cancer Father   . Alcohol abuse Brother   . Coronary artery disease Brother   . Diabetes type II Brother   . Liver cancer Brother       VITAL SIGNS BP 133/71   Pulse 81   Temp 98.5 F (36.9 C)   Resp  15   Ht 5' 1"  (1.549 m)   Wt 178 lb 3.2 oz (80.8 kg)   SpO2 95%   BMI 33.67 kg/m   Outpatient Encounter Medications as of 02/04/2018  Medication Sig  . acetaminophen (TYLENOL) 325 MG tablet Take 650 mg by mouth every 6 (six) hours as needed. for pain/ increased temp. May be administered orally, per G-tube if needed or rectally if unable to swallow (separate order). Maximum dose for 24 hours is 3,000 mg from all sources of Acetaminophen/ Tylenol  . alum & mag hydroxide-simeth (MAALOX PLUS) 400-400-40 MG/5ML suspension Take 30 mLs by mouth every 4 (four) hours as needed for indigestion.   . Artificial Saliva (BIOTENE MOISTURIZING MOUTH MT) Use as directed 2 sprays in the mouth or throat 4 (four) times daily as needed.  . Calcium Carbonate-Vitamin D (CALCIUM 600+D) 600-400 MG-UNIT tablet Take 1 tablet by mouth 2 (two) times daily.  . Carboxymeth-Glycerin-Polysorb (REFRESH OPTIVE ADVANCED) 0.5-1-0.5 % SOLN Place 2 drops into both eyes 2 (two) times daily.  . carvedilol (COREG) 3.125 MG tablet Take 3.125 mg by mouth 2 (two) times daily.   . cetirizine (ZYRTEC) 5 MG tablet Take 5 mg by mouth at bedtime.  . cyanocobalamin 1000 MCG tablet Take 1,000 mcg by mouth daily.  Marland Kitchen demeclocycline (DECLOMYCIN) 300 MG tablet Take 300 mg by mouth 2 (two) times daily.   Marland Kitchen dextromethorphan (DELSYM) 30 MG/5ML liquid Take 10 mLs by mouth every 12 (twelve) hours as needed for cough.  . dipyridamole-aspirin (AGGRENOX) 200-25 MG 12hr capsule Take 1 capsule by mouth every 12 (twelve) hours.   . divalproex (DEPAKOTE SPRINKLE) 125 MG capsule Take 125 mg by mouth 2 (two) times daily.   . Docosanol (ABREVA) 10 % CREA Apply topically to cold sore on upper lip two times daily  . fluticasone furoate-vilanterol (BREO ELLIPTA) 100-25 MCG/INH AEPB Inhale 1 puff into the lungs daily.   Marland Kitchen gabapentin (NEURONTIN) 600 MG tablet Take 600 mg by mouth 3 (three) times daily.   Marland Kitchen guaiFENesin (MUCINEX) 600 MG 12 hr tablet Take 1,200 mg by  mouth every 12 (twelve) hours.   Marland Kitchen ipratropium-albuterol (DUONEB) 0.5-2.5 (3) MG/3ML SOLN Take 3 mLs by nebulization every 6 (six) hours as needed.   Marland Kitchen levothyroxine (SYNTHROID, LEVOTHROID) 75 MCG tablet Take 75 mcg by mouth daily before breakfast.   . magnesium hydroxide (MILK  OF MAGNESIA) 400 MG/5ML suspension Take 30 mLs by mouth every 4 (four) hours as needed for mild constipation.  . Menthol (HALLS COUGH DROPS MT) Take 3.2 mg by mouth as needed   . Menthol, Topical Analgesic, 4 % GEL Apply 1 application topically 2 (two) times daily. Apply to bilateral lower extremities  . montelukast (SINGULAIR) 10 MG tablet Take 10 mg by mouth at bedtime.   Marland Kitchen omeprazole (PRILOSEC) 20 MG capsule Take 20 mg by mouth 2 (two) times daily.   Marland Kitchen oxybutynin (DITROPAN) 5 MG tablet Take 5 mg by mouth 2 (two) times daily.   . OXYGEN Inhale 2 L into the lungs every 4 (four) hours as needed. for dyspnea, Check O2 saturations prior to placing on patient and at least every 4 hours after applying. Notify MD if oxygen saturations drop below baseline while receiving oxygen or no improvement in dyspnea  . polyethylene glycol (MIRALAX / GLYCOLAX) packet Take 17 g by mouth daily. dx: constipation  Hold if loose stool  . prazosin (MINIPRESS) 1 MG capsule Take 3 mg by mouth at bedtime. 3 capsules  . Salicylic Acid 40 % PADS Apply 1 corn pad to Right 5th toe daily until corn resolved  . sennosides-docusate sodium (SENOKOT-S) 8.6-50 MG tablet Take 2 tablets by mouth 2 (two) times daily as needed for constipation. Also take 1 tab by mouth at bedtime daily.  . Skin Protectants, Misc. (ENDIT EX) Apply liberal amount topically to areas of skin irritation as needed. Ok to leave at bedside.  . sodium chloride 1 g tablet Take 1 g by mouth daily.   Marland Kitchen spironolactone (ALDACTONE) 50 MG tablet Take 50 mg by mouth daily.  Marland Kitchen torsemide (DEMADEX) 10 MG tablet Take 10 mg by mouth daily.   Marland Kitchen triamcinolone (NASACORT ALLERGY 24HR) 55 MCG/ACT AERO  nasal inhaler Place 1 spray into the nose daily.  Marland Kitchen UNABLE TO FIND Diet Type: Regular. No Sodium Restriction. Gatorade on lunch and supper trays for hyponatremia  . venlafaxine XR (EFFEXOR-XR) 75 MG 24 hr capsule Take 75 mg by mouth daily with breakfast.  . [DISCONTINUED] Probiotic Product (RISA-BID PROBIOTIC) TABS Take 1 tablet by mouth 3 (three) times daily. (Patient not taking: Reported on 02/04/2018)   No facility-administered encounter medications on file as of 02/04/2018.      SIGNIFICANT DIAGNOSTIC EXAMS  LABS REVIEWED: PREVIOUS   04-24-17: wbc 5.8; hgb 11.4; hct 33.7; mcv 95.2; plt 196 glucose 96; bun 27; creat 0.97; k+ 4.0; na++ 134; ca 9.2; liver normal albumin 3.3 chol 177; ldl 121; trig 86 hdl 39; mag 1.8 tsh 7.731;  Vit B 12 329; vit D 41.0;  04-25-17: depakote free 7.3  06-05-17: tsh 5.209; depakote free 5.9 08-21-17: wbc 11.8; hgb 10.0; hct 29.4; mcv 92.2; plt 278; glucose 94; bun 13; creat 1.01; k+ 3.7; na++ 128; ca 9.1; alk phos 229; albumin 2.4  10-17-17: tsh 7.780 11-28-17:  tsh 6.379  01-08-18; wbc 5.8; hgb 10.9; hct 34.9; mcv 95.6; plt 203; glucose 86; bun 14; creat 0.78; k+ 3.8; na++ 138; ca 9.7; alt 66; alk phos 162; albumin 3.0   NO NEW LABS.   Review of Systems  Constitutional: Negative for malaise/fatigue.  Respiratory: Negative for cough and shortness of breath.   Cardiovascular: Negative for chest pain, palpitations and leg swelling.  Gastrointestinal: Negative for abdominal pain, constipation and heartburn.  Musculoskeletal: Negative for back pain, joint pain and myalgias.  Skin: Negative.   Neurological: Negative for dizziness.  Psychiatric/Behavioral: The patient  is not nervous/anxious.       Physical Exam Constitutional:      General: She is not in acute distress.    Appearance: She is well-developed. She is not diaphoretic.  Neck:     Thyroid: No thyromegaly.  Cardiovascular:     Rate and Rhythm: Normal rate and regular rhythm.     Pulses: Normal  pulses.     Heart sounds: Normal heart sounds.  Pulmonary:     Effort: Pulmonary effort is normal. No respiratory distress.     Breath sounds: Normal breath sounds.  Abdominal:     General: Bowel sounds are normal. There is no distension.     Palpations: Abdomen is soft.     Tenderness: There is no abdominal tenderness.  Musculoskeletal:     Right lower leg: No edema.     Left lower leg: No edema.     Comments: Has left upper extremity contracture AFO left foot Left hand splint  Lymphadenopathy:     Cervical: No cervical adenopathy.  Skin:    General: Skin is warm and dry.  Neurological:     Mental Status: She is alert and oriented to person, place, and time.  Psychiatric:        Mood and Affect: Mood normal.       ASSESSMENT/ PLAN:   TODAY:   1. Cerebral artery occlusion with cerebral infarction 2. Hemiplegia and hemiparesis following cerebral infarction affecting left nondominant side  Will continue her current medications  will continue her current plan of care Will continue therapy as indicated     MD is aware of resident's narcotic use and is in agreement with current plan of care. We will attempt to wean resident as apropriate   Ok Edwards NP [facility]  Contact [phone] Monday through Friday 8am- 5pm  After hours call [phone]

## 2018-02-07 ENCOUNTER — Encounter
Admission: RE | Admit: 2018-02-07 | Discharge: 2018-02-07 | Disposition: A | Discharge status: Home / Self Care | Payer: PPO | Source: Ambulatory Visit | Attending: Internal Medicine | Admitting: Internal Medicine

## 2018-02-09 DIAGNOSIS — M6281 Muscle weakness (generalized): Secondary | ICD-10-CM | POA: Diagnosis not present

## 2018-02-09 DIAGNOSIS — I69354 Hemiplegia and hemiparesis following cerebral infarction affecting left non-dominant side: Secondary | ICD-10-CM | POA: Diagnosis not present

## 2018-02-09 DIAGNOSIS — I69398 Other sequelae of cerebral infarction: Secondary | ICD-10-CM | POA: Diagnosis not present

## 2018-02-09 DIAGNOSIS — M24542 Contracture, left hand: Secondary | ICD-10-CM | POA: Diagnosis not present

## 2018-02-09 DIAGNOSIS — M19012 Primary osteoarthritis, left shoulder: Secondary | ICD-10-CM | POA: Diagnosis not present

## 2018-02-10 ENCOUNTER — Encounter: Payer: Self-pay | Admitting: Adult Health

## 2018-02-10 ENCOUNTER — Non-Acute Institutional Stay (SKILLED_NURSING_FACILITY): Payer: PPO | Admitting: Adult Health

## 2018-02-10 DIAGNOSIS — F329 Major depressive disorder, single episode, unspecified: Secondary | ICD-10-CM | POA: Diagnosis not present

## 2018-02-10 DIAGNOSIS — N3281 Overactive bladder: Secondary | ICD-10-CM | POA: Diagnosis not present

## 2018-02-10 DIAGNOSIS — K219 Gastro-esophageal reflux disease without esophagitis: Secondary | ICD-10-CM

## 2018-02-10 DIAGNOSIS — E041 Nontoxic single thyroid nodule: Secondary | ICD-10-CM | POA: Diagnosis not present

## 2018-02-10 NOTE — Progress Notes (Signed)
[facility]   [facility] Room Number: [place] of Service:  SNF (31)   CODE STATUS: Full Code  Allergies  Allergen Reactions  . Tizanidine Other (See Comments)    HYPOTENSION AND UNRESPONSIVENESS  . Ace Inhibitors Cough  . Latex Hives  . Lisinopril Cough  . Tetracycline Other (See Comments)     REACTION: yeast  . Zanaflex [Tizanidine Hcl] Other (See Comments)    "drowsy"    Chief Complaint  Patient presents with  . Medical Management of Chronic Issues    Gastroesophageal reflux disease without esophagitis; nontoxic thyroid nodule; overactive bladder;  major depressive disorder with single episode remission status unspecified     HPI:  She is a 78 year old long term resident of this facility being seen for the management of her chronic illnesses: gerd; thyroid nodule; major depression; overactive bladder. She denies any changes in her appetite; no insomnia; no uncontrolled pain; no heart burn.     Past Medical History:  Diagnosis Date  . Arthritis   . Asthma   . 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 Depression with anxiety   . GERD (gastroesophageal reflux disease)   . Hemiparesis affecting left side as late effect of cerebrovascular accident (CVA) ([place])   . Hot flashes   . Hyperlipidemia   . Hypertension   . Muscle hypertonicity   . Osteoarthritis   . Stroke ([place])   . TIA (transient ischemic attack)     Past Surgical History:  Procedure Laterality Date  . ACHILLES TENDON LENGTHENING  08/25/2015   Procedure: ACHILLES TENDON LENGTHENING;  Surgeon: Samara Deist, DPM;  Location: [facility] ORS;  Service: Podiatry;;  . APPENDECTOMY    . COLONOSCOPY  06/17/2008  . EYE SURGERY Bilateral    Cataract Extraction with IOL  . FLAT FOOT RECONSTRUCTION-TAL GASTROC RECESSION Left 08/25/2015   Procedure: ENDOSCOPIC FLAT FOOT RECONSTRUCTION-TAL GASTROC RECESSION;  Surgeon: Samara Deist, DPM;  Location: [facility] ORS;  Service: Podiatry;   Laterality: Left;  Marland Kitchen VAGINAL HYSTERECTOMY      Social History   Socioeconomic History  . Marital status: Married    Spouse name: Not on file  . Number of children: Not on file  . Years of education: Not on file  . Highest education level: Not on file  Occupational History  . Occupation: retired  Scientific laboratory technician  . Financial resource strain: Not on file  . Food insecurity:    Worry: Not on file    Inability: Not on file  . Transportation needs:    Medical: Not on file    Non-medical: Not on file  Tobacco Use  . Smoking status: Former Smoker    Packs/day: 0.50    Years: 3.00    Pack years: 1.50    Types: Cigarettes    Last attempt to quit: 01/08/1963    Years since quitting: 55.1  . Smokeless tobacco: Never Used  Substance and Sexual Activity  . Alcohol use: No    Alcohol/week: 0.0 standard drinks    Comment: does not drink alcohol  . Drug use: No  . Sexual activity: Not on file  Lifestyle  . Physical activity:    Days per week: Not on file    Minutes per session: Not on file  . Stress: Not on file  Relationships  . Social connections:    Talks on phone: Not on file    Gets together: Not on file    Attends  religious service: Not on file    Active member of club or organization: Not on file    Attends meetings of clubs or organizations: Not on file    Relationship status: Not on file  . Intimate partner violence:    Fear of current or ex partner: Not on file    Emotionally abused: Not on file    Physically abused: Not on file    Forced sexual activity: Not on file  Other Topics Concern  . Not on file  Social History Narrative   Admitted to [facility] 11/30/2014   Married   Former smoker   Full Code   Family History  Problem Relation Age of Onset  . Alcohol abuse Mother   . Coronary artery disease Mother   . Stroke Mother   . Liver cancer Father   . Alcohol abuse Brother   . Coronary artery disease Brother   . Diabetes type II Brother   . Liver  cancer Brother       VITAL SIGNS BP 136/74   Pulse 78   Temp 98.1 F (36.7 C)   Resp 18   Ht 5' 1"  (1.549 m)   Wt 178 lb 3.2 oz (80.8 kg)   SpO2 95%   BMI 33.67 kg/m   Outpatient Encounter Medications as of 02/10/2018  Medication Sig  . acetaminophen (TYLENOL) 325 MG tablet Take 650 mg by mouth every 6 (six) hours as needed. for pain/ increased temp. May be administered orally, per G-tube if needed or rectally if unable to swallow (separate order). Maximum dose for 24 hours is 3,000 mg from all sources of Acetaminophen/ Tylenol  . alum & mag hydroxide-simeth (MAALOX PLUS) 400-400-40 MG/5ML suspension Take 30 mLs by mouth every 4 (four) hours as needed for indigestion.   . Artificial Saliva (BIOTENE MOISTURIZING MOUTH MT) Use as directed 2 sprays in the mouth or throat 4 (four) times daily as needed.  . Calcium Carbonate-Vitamin D (CALCIUM 600+D) 600-400 MG-UNIT tablet Take 1 tablet by mouth 2 (two) times daily.  . Carboxymeth-Glycerin-Polysorb (REFRESH OPTIVE ADVANCED) 0.5-1-0.5 % SOLN Place 2 drops into both eyes 2 (two) times daily.  . carvedilol (COREG) 3.125 MG tablet Take 3.125 mg by mouth 2 (two) times daily.   . cetirizine (ZYRTEC) 5 MG tablet Take 5 mg by mouth at bedtime.  . cyanocobalamin 1000 MCG tablet Take 1,000 mcg by mouth daily.  Marland Kitchen demeclocycline (DECLOMYCIN) 300 MG tablet Take 300 mg by mouth 2 (two) times daily.   Marland Kitchen dextromethorphan (DELSYM) 30 MG/5ML liquid Take 10 mLs by mouth every 12 (twelve) hours as needed for cough.  . dipyridamole-aspirin (AGGRENOX) 200-25 MG 12hr capsule Take 1 capsule by mouth every 12 (twelve) hours.   . divalproex (DEPAKOTE SPRINKLE) 125 MG capsule Take 125 mg by mouth 2 (two) times daily.   . Docosanol (ABREVA) 10 % CREA Apply topically to cold sore on upper lip two times daily  . fluticasone furoate-vilanterol (BREO ELLIPTA) 100-25 MCG/INH AEPB Inhale 1 puff into the lungs daily.   Marland Kitchen gabapentin (NEURONTIN) 600 MG tablet Take 600 mg by  mouth 3 (three) times daily.   Marland Kitchen guaiFENesin (MUCINEX) 600 MG 12 hr tablet Take 1,200 mg by mouth every 12 (twelve) hours.   Marland Kitchen ipratropium-albuterol (DUONEB) 0.5-2.5 (3) MG/3ML SOLN Take 3 mLs by nebulization every 6 (six) hours as needed.   Marland Kitchen levothyroxine (SYNTHROID, LEVOTHROID) 75 MCG tablet Take 75 mcg by mouth daily before breakfast.   . magnesium hydroxide (  MILK OF MAGNESIA) 400 MG/5ML suspension Take 30 mLs by mouth every 4 (four) hours as needed for mild constipation.  . Menthol (HALLS COUGH DROPS MT) Take 3.2 mg by mouth as needed   . Menthol, Topical Analgesic, 4 % GEL Apply 1 application topically 2 (two) times daily. Apply to bilateral lower extremities  . montelukast (SINGULAIR) 10 MG tablet Take 10 mg by mouth at bedtime.   Marland Kitchen omeprazole (PRILOSEC) 20 MG capsule Take 20 mg by mouth 2 (two) times daily.   Marland Kitchen oxybutynin (DITROPAN) 5 MG tablet Take 5 mg by mouth 2 (two) times daily.   . OXYGEN O2 as needed.  Titrate to keep sat > / = 92  . polyethylene glycol (MIRALAX / GLYCOLAX) packet Take 17 g by mouth daily. dx: constipation  Hold if loose stool  . prazosin (MINIPRESS) 1 MG capsule Take 3 mg by mouth at bedtime. 3 capsules  . Salicylic Acid 40 % PADS Apply 1 corn pad to Right 5th toe daily until corn resolved  . sennosides-docusate sodium (SENOKOT-S) 8.6-50 MG tablet Take 2 tablets by mouth 2 (two) times daily as needed for constipation. Also take 1 tab by mouth at bedtime daily.  . Skin Protectants, Misc. (ENDIT EX) Apply liberal amount topically to areas of skin irritation as needed. Ok to leave at bedside.  . sodium chloride 1 g tablet Take 1 g by mouth daily.   Marland Kitchen spironolactone (ALDACTONE) 50 MG tablet Take 50 mg by mouth daily.  Marland Kitchen torsemide (DEMADEX) 10 MG tablet Take 10 mg by mouth daily.   Marland Kitchen triamcinolone (NASACORT ALLERGY 24HR) 55 MCG/ACT AERO nasal inhaler Place 1 spray into the nose daily.  Marland Kitchen UNABLE TO FIND Diet Type: Regular. No Sodium Restriction. Gatorade on lunch and  supper trays for hyponatremia  . venlafaxine XR (EFFEXOR-XR) 75 MG 24 hr capsule Take 75 mg by mouth daily with breakfast.   No facility-administered encounter medications on file as of 02/10/2018.      SIGNIFICANT DIAGNOSTIC EXAMS  LABS REVIEWED: PREVIOUS   04-24-17: wbc 5.8; hgb 11.4; hct 33.7; mcv 95.2; plt 196 glucose 96; bun 27; creat 0.97; k+ 4.0; na++ 134; ca 9.2; liver normal albumin 3.3 chol 177; ldl 121; trig 86 hdl 39; mag 1.8 tsh 7.731;  Vit B 12 329; vit D 41.0;  04-25-17: depakote free 7.3  06-05-17: tsh 5.209; depakote free 5.9 08-21-17: wbc 11.8; hgb 10.0; hct 29.4; mcv 92.2; plt 278; glucose 94; bun 13; creat 1.01; k+ 3.7; na++ 128; ca 9.1; alk phos 229; albumin 2.4  10-17-17: tsh 7.780 11-28-17:  tsh 6.379  01-08-18; wbc 5.8; hgb 10.9; hct 34.9; mcv 95.6; plt 203; glucose 86; bun 14; creat 0.78; k+ 3.8; na++ 138; ca 9.7; alt 66; alk phos 162; albumin 3.0   NO NEW LABS.   Review of Systems  Constitutional: Negative for malaise/fatigue.  Respiratory: Negative for cough and shortness of breath.   Cardiovascular: Negative for chest pain, palpitations and leg swelling.  Gastrointestinal: Negative for abdominal pain, constipation and heartburn.  Musculoskeletal: Negative for back pain, joint pain and myalgias.  Skin: Negative.   Neurological: Negative for dizziness.  Psychiatric/Behavioral: The patient is not nervous/anxious.    Physical Exam Constitutional:      General: She is not in acute distress.    Appearance: She is well-developed. She is not diaphoretic.  Neck:     Musculoskeletal: Neck supple.     Thyroid: No thyromegaly.  Cardiovascular:     Rate and  Rhythm: Normal rate and regular rhythm.     Pulses: Normal pulses.  Pulmonary:     Effort: Pulmonary effort is normal. No respiratory distress.     Breath sounds: Normal breath sounds.  Abdominal:     General: Bowel sounds are normal. There is no distension.     Palpations: Abdomen is soft.     Tenderness:  There is no abdominal tenderness.  Musculoskeletal:     Right lower leg: No edema.     Left lower leg: No edema.     Comments: Has left upper extremity contracture AFO left foot Left hand splint   Lymphadenopathy:     Cervical: No cervical adenopathy.  Skin:    General: Skin is warm and dry.  Neurological:     Mental Status: She is alert and oriented to person, place, and time.  Psychiatric:        Mood and Affect: Mood normal.       ASSESSMENT/ PLAN:    TODAY:   1 Gastroesophageal reflux disease without esophagitis: stable prilosec 20 mg twice  daily   2. Hypothyroidism due to acquired atrophy of thyroid: stable tsh 6.379 will continue synthroid 75 mcg daily lab work pending.   3.  Overactive bladder: is stable will continue ditropan 5 mg twice daily   4.  Major depression, single episode: stable will continue effexor xr 75 mg daily depakote 125 mg twice daily stabilize mood   PREVIOUS   5. Chronic constipation: stable will continue miralax daily  senna s nightly and 2 tabs twice daily as needed   6. Bilateral lower extremity edema: is stable will continue demadex 20 mg daily   7. Bilateral chronic knee pain: stable will monitor  8. SIADH: is stable will continue declomycin 300 mg twice daily NACL 1 gm daily and gatorade twice daily   9.  Cerebral artery occlusion with cerebral infarction: is neurologically stable; will continue aggrenox twice daily   10. Essential hypertension, benign: is stable b/p 136/74: will continue coreg 3.125 mg twice daily minipress 3 mg nightly; aldactone 50 mg daily   11. Allergic rhinitis: is stable will continue zyrtec 5 mg daily singulair 10 mg daily nasocort daily   12. Uncomplicated asthma: is stable will continue mucomyst neb daily mucinex 1200 mg twice  breo daily; duoneb every 6 hours as needed  Will stop atrovent nasal spray and will moniotor her status.    Will check cmp   MD is aware of resident's narcotic use and is in  agreement with current plan of care. We will attempt to wean resident as apropriate   Ok Edwards NP [facility]  Contact [phone] Monday through Friday 8am- 5pm  After hours call [phone]

## 2018-02-11 DIAGNOSIS — I69354 Hemiplegia and hemiparesis following cerebral infarction affecting left non-dominant side: Secondary | ICD-10-CM | POA: Diagnosis not present

## 2018-02-11 DIAGNOSIS — I10 Essential (primary) hypertension: Secondary | ICD-10-CM | POA: Diagnosis not present

## 2018-02-11 DIAGNOSIS — Z0000 Encounter for general adult medical examination without abnormal findings: Secondary | ICD-10-CM | POA: Diagnosis not present

## 2018-02-11 DIAGNOSIS — F325 Major depressive disorder, single episode, in full remission: Secondary | ICD-10-CM | POA: Diagnosis not present

## 2018-02-11 DIAGNOSIS — E871 Hypo-osmolality and hyponatremia: Secondary | ICD-10-CM | POA: Diagnosis not present

## 2018-02-11 DIAGNOSIS — Z789 Other specified health status: Secondary | ICD-10-CM | POA: Diagnosis not present

## 2018-02-17 ENCOUNTER — Other Ambulatory Visit
Admission: RE | Admit: 2018-02-17 | Discharge: 2018-02-17 | Disposition: A | Discharge status: Home / Self Care | Payer: PPO | Source: Ambulatory Visit | Attending: Adult Health | Admitting: Adult Health

## 2018-02-17 DIAGNOSIS — I10 Essential (primary) hypertension: Secondary | ICD-10-CM | POA: Insufficient documentation

## 2018-02-17 LAB — BASIC METABOLIC PANEL
Anion gap: 4 — ABNORMAL LOW (ref 5–15)
BUN: 14 mg/dL (ref 8–23)
CO2: 30 mmol/L (ref 22–32)
Calcium: 9.3 mg/dL (ref 8.9–10.3)
Chloride: 101 mmol/L (ref 98–111)
Creatinine, Ser: 0.81 mg/dL (ref 0.44–1.00)
GFR calc Af Amer: >60 mL/min (ref >60)
GFR calc non Af Amer: >60 mL/min (ref >60)
GLUCOSE: 83 mg/dL (ref 70–99)
POTASSIUM: 4 mmol/L (ref 3.5–5.1)
Sodium: 135 mmol/L (ref 135–145)

## 2018-02-23 ENCOUNTER — Encounter: Payer: Self-pay | Admitting: Adult Health

## 2018-02-23 ENCOUNTER — Non-Acute Institutional Stay (SKILLED_NURSING_FACILITY): Payer: PPO | Admitting: Adult Health

## 2018-02-23 DIAGNOSIS — I6350 Cerebral infarction due to unspecified occlusion or stenosis of unspecified cerebral artery: Secondary | ICD-10-CM | POA: Diagnosis not present

## 2018-02-23 DIAGNOSIS — I69354 Hemiplegia and hemiparesis following cerebral infarction affecting left non-dominant side: Secondary | ICD-10-CM

## 2018-02-23 DIAGNOSIS — L819 Disorder of pigmentation, unspecified: Secondary | ICD-10-CM

## 2018-02-23 NOTE — Progress Notes (Signed)
[facility]:  [facility] Room Number: [id number] Place of Service:  SNF [phone]) Provider:  Durenda Age, NP  Patient Care Team: Kirk Ruths, MD as PCP - General (Internal Medicine) Gerlene Fee, NP as Nurse Practitioner (Geriatric Medicine)  Extended Emergency Contact Information Primary Emergency Contact: Norm Salt Address: [place]          [name] Phone: [phone] Mobile Phone: [phone] Relation: Spouse Secondary Emergency Contact: Barry Dienes, [place] [address] [place]itter of [place] Phone: [phone] Mobile Phone: [phone] Relation: Daughter  Code Status:  Full Code  Goals of care: Advanced Directive information Advanced Directives 02/10/2018  Does Patient Have a Medical Advance Directive? No  Type of Advance Directive -  Does patient want to make changes to medical advance directive? -  Copy of [place] in Chart? -  Would patient like information on creating a medical advance directive? No - Patient declined     Chief Complaint  Patient presents with  . Acute Visit    Patient has discoloration of her left foot.    HPI:  Pt is a 78 y.o. female seen today for an acute visit secondary to discoloration of her left foot.  She is a long-term care resident of [facility]  She has a PMH of cerebral infarction of left non-dominant side with hemiplegia and hemiparesis, essential HTN, anxiety, flexion deformity left ankle and toes, PTSD, OAB, and OA of left shoulder. She was seen in her room today. She was having normal looking skin color when she wakes up in the morning but when she sits up and put her left foot down, it starts to get darker. She said that this occurs everyday. Left pedal pulses 2+. She denies having pain. She had history of cerebral infarction and has left hemiplegia.   Past Medical History:  Diagnosis Date  .  Arthritis   . Asthma   . 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 Depression with anxiety   . GERD (gastroesophageal reflux disease)   . Hemiparesis affecting left side as late effect of cerebrovascular accident (CVA) ([place])   . Hot flashes   . Hyperlipidemia   . Hypertension   . Muscle hypertonicity   . Osteoarthritis   . Stroke ([place])   . TIA (transient ischemic attack)    Past Surgical History:  Procedure Laterality Date  . ACHILLES TENDON LENGTHENING  08/25/2015   Procedure: ACHILLES TENDON LENGTHENING;  Surgeon: Samara Deist, DPM;  Location: [facility] ORS;  Service: Podiatry;;  . APPENDECTOMY    . COLONOSCOPY  06/17/2008  . EYE SURGERY Bilateral    Cataract Extraction with IOL  . FLAT FOOT RECONSTRUCTION-TAL GASTROC RECESSION Left 08/25/2015   Procedure: ENDOSCOPIC FLAT FOOT RECONSTRUCTION-TAL GASTROC RECESSION;  Surgeon: Samara Deist, DPM;  Location: [facility] ORS;  Service: Podiatry;  Laterality: Left;  Marland Kitchen VAGINAL HYSTERECTOMY      Allergies  Allergen Reactions  . Tizanidine Other (See Comments)    HYPOTENSION AND UNRESPONSIVENESS  . Ace Inhibitors Cough  . Latex Hives  . Lisinopril Cough  . Tetracycline Other (See Comments)     REACTION: yeast  . Zanaflex [Tizanidine Hcl] Other (See Comments)    "drowsy"    Outpatient Encounter Medications as of 02/23/2018  Medication Sig  . acetaminophen (TYLENOL) 325 MG tablet Take 650 mg by mouth every 6 (six) hours  as needed. for pain/ increased temp. May be administered orally, per G-tube if needed or rectally if unable to swallow (separate order). Maximum dose for 24 hours is 3,000 mg from all sources of Acetaminophen/ Tylenol  . alum & mag hydroxide-simeth (MAALOX PLUS) 400-400-40 MG/5ML suspension Take 30 mLs by mouth every 4 (four) hours as needed for indigestion.   . Artificial Saliva (BIOTENE MOISTURIZING MOUTH MT) Use as directed 2 sprays in the mouth or throat 4 (four) times daily as needed.  . Calcium Carbonate-Vitamin D  (CALCIUM 600+D) 600-400 MG-UNIT tablet Take 1 tablet by mouth 2 (two) times daily.  . Carboxymeth-Glycerin-Polysorb (REFRESH OPTIVE ADVANCED) 0.5-1-0.5 % SOLN Place 2 drops into both eyes 2 (two) times daily.  . carvedilol (COREG) 3.125 MG tablet Take 3.125 mg by mouth 2 (two) times daily.   . cetirizine (ZYRTEC) 5 MG tablet Take 5 mg by mouth at bedtime.  . cyanocobalamin 1000 MCG tablet Take 1,000 mcg by mouth daily.  Marland Kitchen demeclocycline (DECLOMYCIN) 300 MG tablet Take 300 mg by mouth 2 (two) times daily.   Marland Kitchen dextromethorphan (DELSYM) 30 MG/5ML liquid Take 10 mLs by mouth every 12 (twelve) hours as needed for cough.  . dipyridamole-aspirin (AGGRENOX) 200-25 MG 12hr capsule Take 1 capsule by mouth every 12 (twelve) hours.   . divalproex (DEPAKOTE SPRINKLE) 125 MG capsule Take 125 mg by mouth 2 (two) times daily.   . Docosanol (ABREVA) 10 % CREA Apply topically to cold sore on upper lip two times daily  . fluticasone furoate-vilanterol (BREO ELLIPTA) 100-25 MCG/INH AEPB Inhale 1 puff into the lungs daily.   Marland Kitchen gabapentin (NEURONTIN) 600 MG tablet Take 600 mg by mouth 3 (three) times daily.   Marland Kitchen guaiFENesin (MUCINEX) 600 MG 12 hr tablet Take 1,200 mg by mouth every 12 (twelve) hours.   Marland Kitchen ipratropium-albuterol (DUONEB) 0.5-2.5 (3) MG/3ML SOLN Take 3 mLs by nebulization every 6 (six) hours as needed.   Marland Kitchen levothyroxine (SYNTHROID, LEVOTHROID) 75 MCG tablet Take 75 mcg by mouth daily before breakfast.   . magnesium hydroxide (MILK OF MAGNESIA) 400 MG/5ML suspension Take 30 mLs by mouth every 4 (four) hours as needed for mild constipation.  . Menthol (HALLS COUGH DROPS MT) Take 3.2 mg by mouth as needed   . Menthol, Topical Analgesic, 4 % GEL Apply 1 application topically 2 (two) times daily. Apply to bilateral lower extremities  . montelukast (SINGULAIR) 10 MG tablet Take 10 mg by mouth at bedtime.   Marland Kitchen omeprazole (PRILOSEC) 20 MG capsule Take 20 mg by mouth 2 (two) times daily.   Marland Kitchen oxybutynin (DITROPAN) 5  MG tablet Take 5 mg by mouth 2 (two) times daily.   . OXYGEN O2 as needed.  Titrate to keep sat > / = 92  . polyethylene glycol (MIRALAX / GLYCOLAX) packet Take 17 g by mouth daily. dx: constipation  Hold if loose stool  . prazosin (MINIPRESS) 1 MG capsule Take 3 mg by mouth at bedtime. 3 capsules  . Salicylic Acid 40 % PADS Apply 1 corn pad to Right 5th toe daily until corn resolved  . sennosides-docusate sodium (SENOKOT-S) 8.6-50 MG tablet Take 2 tablets by mouth 2 (two) times daily as needed for constipation. Also take 1 tab by mouth at bedtime daily.  . Skin Protectants, Misc. (ENDIT EX) Apply liberal amount topically to areas of skin irritation as needed. Ok to leave at bedside.  . sodium chloride 1 g tablet Take 1 g by mouth daily.   Marland Kitchen spironolactone (ALDACTONE)  50 MG tablet Take 50 mg by mouth daily.  Marland Kitchen torsemide (DEMADEX) 10 MG tablet Take 10 mg by mouth daily.   Marland Kitchen triamcinolone (NASACORT ALLERGY 24HR) 55 MCG/ACT AERO nasal inhaler Place 1 spray into the nose daily.  Marland Kitchen UNABLE TO FIND Diet Type: Regular. No Sodium Restriction. Gatorade on lunch and supper trays for hyponatremia  . venlafaxine XR (EFFEXOR-XR) 75 MG 24 hr capsule Take 75 mg by mouth daily with breakfast.   No facility-administered encounter medications on file as of 02/23/2018.     Review of Systems  GENERAL: No change in appetite, no fatigue, no weight changes, no fever, chills or weakness MOUTH and THROAT: Denies oral discomfort, gingival pain or bleeding  RESPIRATORY: no cough, SOB, DOE, wheezing, hemoptysis CARDIAC: No chest pain, edema or palpitations GI: No abdominal pain, diarrhea, constipation, heart burn, nausea or vomiting GU: Denies dysuria, frequency, hematuria, or discharge NEUROLOGICAL: Denies dizziness, syncope, numbness, or headache PSYCHIATRIC: Denies feelings of depression or anxiety. No report of hallucinations, insomnia, paranoia, or agitation    Immunization History  Administered Date(s)  Administered  . Influenza Split 09/30/2014  . Influenza Whole 01/08/2004, 10/08/2006, 11/22/2008  . Influenza-Unspecified 08/13/2013, 09/21/2015, 09/25/2016, 10/09/2017  . PPD Test 01/08/2016, 11/07/2017  . Pneumococcal Conjugate-13 08/13/2013  . Pneumococcal Polysaccharide-23 10/24/2000, 11/30/2014  . Td 01/07/2001  . Zoster 01/07/2006   Pertinent  Health Maintenance Due  Topic Date Due  . INFLUENZA VACCINE  Completed  . DEXA SCAN  Completed  . PNA vac Low Risk Adult  Completed    Vitals:   02/23/18 1617  BP: 139/76  Pulse: 84  Resp: 15  Temp: 98.5 F (36.9 C)  TempSrc: Oral  SpO2: 95%  Weight: 178 lb 4.8 oz (80.9 kg)  Height: 5\' 1"  (1.549 m)   Body mass index is 33.69 kg/m.  Physical Exam  GENERAL APPEARANCE: Well nourished. In no acute distress. Obese. SKIN:  Left foot has dark discoloration with pale nailbeds MOUTH and THROAT: Lips are without lesions. Oral mucosa is moist and without lesions. Tongue is normal in shape, size, and color and without lesions CARDIAC: RRR, no murmur,no extra heart sounds, no edema GI: Abdomen soft, normal BS, no masses, no tenderness EXTREMITIES: Left hand contracted with splint NEUROLOGICAL: There is no tremor. Speech is clear. Left hemiplegia, Alert and oriented X 3 PSYCHIATRIC:  Affect and behavior are appropriate  Labs reviewed: Recent Labs    04/24/17 0525  10/17/17 0035 01/08/18 0615 02/17/18 0601  NA 134*   < > 135 138 135  K 4.0   < > 4.3 3.8 4.0  CL 96*   < > 98 103 101  CO2 31   < > 28 30 30   GLUCOSE 96   < > 82 86 83  BUN 27*   < > 14 14 14   CREATININE 0.97   < > 0.77 0.78 0.81  CALCIUM 9.2   < > 9.2 9.7 9.3  MG 1.8  --   --   --   --    < > = values in this interval not displayed.   Recent Labs    08/18/17 1356 08/21/17 0620 01/08/18 0615  AST 33 14* 25  ALT 34 17 66*  ALKPHOS 229* 163* 162*  BILITOT 0.7 0.6 0.5  PROT 6.1* 5.7* 6.1*  ALBUMIN 2.4* 2.1* 3.0*   Recent Labs    08/18/17 1356  08/21/17 0620 01/08/18 0615  WBC 11.8* 8.3 5.8  NEUTROABS 9.1* 5.8 3.1  HGB  10.0* 9.7* 10.9*  HCT 29.4* 28.5* 34.9*  MCV 92.2 92.5 95.6  PLT 278 343 203   Lab Results  Component Value Date   TSH 2.887 01/08/2018   Lab Results  Component Value Date   HGBA1C 5.6 06/23/2009   Lab Results  Component Value Date   CHOL 177 04/24/2017   HDL 39 (L) 04/24/2017   LDLCALC 121 (H) 04/24/2017   LDLDIRECT 161.4 12/14/2008   TRIG 86 04/24/2017   CHOLHDL 4.5 04/24/2017    Assessment/Plan  1. Discoloration of skin of foot - has 2+ pulses on left foot, has left hemiplegia, denies pain, will order ABI to check circulation on left foot  2. Hemiplegia and hemiparesis following cerebral infarction affecting left non-dominant side (HCC) - continue splint on left hand, supportive care and fall precautions  3. Cerebral artery occlusion with cerebral infarction ([place]) - continue  Aggrenox ER 25-200 mg 1 capsule Q 12 hours    Family/ staff Communication: Discussed plan of care with resident.  Labs/tests ordered:  ABI on left foot  Goals of care:   Long-term care.   Durenda Age, NP [facility] [phone] (Monday-Friday 8:00 a.m. - 5:00 p.m.) [phone] (after hours)

## 2018-02-24 DIAGNOSIS — L97509 Non-pressure chronic ulcer of other part of unspecified foot with unspecified severity: Secondary | ICD-10-CM | POA: Diagnosis not present

## 2018-02-26 ENCOUNTER — Encounter: Payer: Self-pay | Admitting: Adult Health

## 2018-02-26 ENCOUNTER — Non-Acute Institutional Stay (SKILLED_NURSING_FACILITY): Payer: PPO | Admitting: Adult Health

## 2018-02-26 DIAGNOSIS — F39 Unspecified mood [affective] disorder: Secondary | ICD-10-CM | POA: Diagnosis not present

## 2018-02-26 DIAGNOSIS — F4310 Post-traumatic stress disorder, unspecified: Secondary | ICD-10-CM

## 2018-02-26 DIAGNOSIS — I10 Essential (primary) hypertension: Secondary | ICD-10-CM | POA: Diagnosis not present

## 2018-02-26 DIAGNOSIS — E222 Syndrome of inappropriate secretion of antidiuretic hormone: Secondary | ICD-10-CM

## 2018-02-26 DIAGNOSIS — K219 Gastro-esophageal reflux disease without esophagitis: Secondary | ICD-10-CM | POA: Diagnosis not present

## 2018-02-26 DIAGNOSIS — J4540 Moderate persistent asthma, uncomplicated: Secondary | ICD-10-CM

## 2018-02-26 DIAGNOSIS — F329 Major depressive disorder, single episode, unspecified: Secondary | ICD-10-CM | POA: Diagnosis not present

## 2018-02-26 DIAGNOSIS — G629 Polyneuropathy, unspecified: Secondary | ICD-10-CM | POA: Diagnosis not present

## 2018-02-26 DIAGNOSIS — N3281 Overactive bladder: Secondary | ICD-10-CM

## 2018-02-26 DIAGNOSIS — I6350 Cerebral infarction due to unspecified occlusion or stenosis of unspecified cerebral artery: Secondary | ICD-10-CM

## 2018-02-26 DIAGNOSIS — E038 Other specified hypothyroidism: Secondary | ICD-10-CM | POA: Diagnosis not present

## 2018-02-26 NOTE — Progress Notes (Signed)
[facility]:  [facility] Room Number: [place] of Service:  SNF [phone] Provider:  Durenda Age, NP  Patient Care Team: Kirk Ruths, MD as PCP - General (Internal Medicine) Gerlene Fee, NP as Nurse Practitioner (Geriatric Medicine)  Extended Emergency Contact Information Primary Emergency Contact: Norm Salt Address: [name] Phone: [phone] Mobile Phone: [phone] Relation: Spouse Secondary Emergency Contact: Barry Dienes, [place] [address] [place] of [place] Phone: [phone] Mobile Phone: [phone] Relation: Daughter  Code Status:  FULL  Goals of care: Advanced Directive information Advanced Directives 02/26/2018  Does Patient Have a Medical Advance Directive? No  Type of Advance Directive -  Does patient want to make changes to medical advance directive? -  Copy of [place] in Chart? -  Would patient like information on creating a medical advance directive? No - Patient declined     Chief Complaint  Patient presents with  . Discharge Note    Discharge from facility     HPI:  [name] is a 78 y.o. female seen today for discharge to [facility] (LTC).   She has been a long-term resident of [facility] since 11/30/14. She has PMH of TIA, asthma, arthritis, hyperlipidemia, hypertension, stroke, depression with anxiety and hemiparesis affecting left side as late effect of CVA.   Past Medical History:  Diagnosis Date  . Arthritis   . Asthma   . 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 Depression with anxiety   . GERD (gastroesophageal reflux disease)   . Hemiparesis affecting left side as late effect of cerebrovascular accident (CVA) ([place])   . Hot flashes   . Hyperlipidemia   . Hypertension   . Muscle hypertonicity   . Osteoarthritis   . Stroke ([place])   . TIA (transient ischemic  attack)    Past Surgical History:  Procedure Laterality Date  . ACHILLES TENDON LENGTHENING  08/25/2015   Procedure: ACHILLES TENDON LENGTHENING;  Surgeon: Samara Deist, DPM;  Location: [facility] ORS;  Service: Podiatry;;  . APPENDECTOMY    . COLONOSCOPY  06/17/2008  . EYE SURGERY Bilateral    Cataract Extraction with IOL  . FLAT FOOT RECONSTRUCTION-TAL GASTROC RECESSION Left 08/25/2015   Procedure: ENDOSCOPIC FLAT FOOT RECONSTRUCTION-TAL GASTROC RECESSION;  Surgeon: Samara Deist, DPM;  Location: [facility] ORS;  Service: Podiatry;  Laterality: Left;  Marland Kitchen VAGINAL HYSTERECTOMY      Allergies  Allergen Reactions  . Tizanidine Other (See Comments)    HYPOTENSION AND UNRESPONSIVENESS  . Ace Inhibitors Cough  . Latex Hives  . Lisinopril Cough  . Tetracycline Other (See Comments)     REACTION: yeast  . Zanaflex [Tizanidine Hcl] Other (See Comments)    "drowsy"    Outpatient Encounter Medications as of 02/26/2018  Medication Sig  . acetaminophen (TYLENOL) 325 MG tablet Take 650 mg by mouth every 6 (six) hours as needed. for pain/ increased temp. May be administered orally, per G-tube if needed or rectally if unable to swallow (separate order). Maximum dose for 24 hours is 3,000 mg from all sources of Acetaminophen/ Tylenol  . alum & mag hydroxide-simeth (MAALOX PLUS) 400-400-40 MG/5ML suspension Take 30 mLs by mouth every 4 (four) hours as needed for indigestion.   . Artificial Saliva (BIOTENE MOISTURIZING MOUTH MT) Use as directed 2 sprays in the  mouth or throat 4 (four) times daily as needed.  . Calcium Carbonate-Vitamin D (CALCIUM 600+D) 600-400 MG-UNIT tablet Take 1 tablet by mouth 2 (two) times daily.  . Carboxymeth-Glycerin-Polysorb (REFRESH OPTIVE ADVANCED) 0.5-1-0.5 % SOLN Place 2 drops into both eyes 2 (two) times daily.  . carvedilol (COREG) 3.125 MG tablet Take 3.125 mg by mouth 2 (two) times daily.   . cetirizine (ZYRTEC) 5 MG tablet Take 5 mg by mouth at bedtime.  . cyanocobalamin 1000  MCG tablet Take 1,000 mcg by mouth daily.  Marland Kitchen demeclocycline (DECLOMYCIN) 300 MG tablet Take 300 mg by mouth 2 (two) times daily.   Marland Kitchen dextromethorphan (DELSYM) 30 MG/5ML liquid Take 10 mLs by mouth every 12 (twelve) hours as needed for cough.  . dipyridamole-aspirin (AGGRENOX) 200-25 MG 12hr capsule Take 1 capsule by mouth every 12 (twelve) hours.   . divalproex (DEPAKOTE SPRINKLE) 125 MG capsule Take 125 mg by mouth 2 (two) times daily.   . Docosanol (ABREVA) 10 % CREA Apply topically to cold sore on upper lip two times daily  . fluticasone furoate-vilanterol (BREO ELLIPTA) 100-25 MCG/INH AEPB Inhale 1 puff into the lungs daily.   Marland Kitchen gabapentin (NEURONTIN) 600 MG tablet Take 600 mg by mouth 3 (three) times daily.   Marland Kitchen guaiFENesin (MUCINEX) 600 MG 12 hr tablet Take 1,200 mg by mouth every 12 (twelve) hours.   Marland Kitchen ipratropium-albuterol (DUONEB) 0.5-2.5 (3) MG/3ML SOLN Take 3 mLs by nebulization every 6 (six) hours as needed.   Marland Kitchen levothyroxine (SYNTHROID, LEVOTHROID) 75 MCG tablet Take 75 mcg by mouth daily before breakfast.   . magnesium hydroxide (MILK OF MAGNESIA) 400 MG/5ML suspension Take 30 mLs by mouth every 4 (four) hours as needed for mild constipation.  . Menthol (HALLS COUGH DROPS MT) Take 3.2 mg by mouth as needed   . Menthol, Topical Analgesic, 4 % GEL Apply 1 application topically 2 (two) times daily. Apply to bilateral lower extremities  . montelukast (SINGULAIR) 10 MG tablet Take 10 mg by mouth at bedtime.   Marland Kitchen omeprazole (PRILOSEC) 20 MG capsule Take 20 mg by mouth 2 (two) times daily.   Marland Kitchen oxybutynin (DITROPAN) 5 MG tablet Take 5 mg by mouth 2 (two) times daily.   . OXYGEN O2 as needed.  Titrate to keep sat > / = 92  . polyethylene glycol (MIRALAX / GLYCOLAX) packet Take 17 g by mouth daily. dx: constipation  Hold if loose stool  . prazosin (MINIPRESS) 1 MG capsule Take 3 mg by mouth at bedtime. 3 capsules  . Salicylic Acid 40 % PADS Apply 1 corn pad to Right 5th toe daily until corn  resolved  . sennosides-docusate sodium (SENOKOT-S) 8.6-50 MG tablet Take 2 tablets by mouth 2 (two) times daily as needed for constipation. Also take 1 tab by mouth at bedtime daily.  . Skin Protectants, Misc. (ENDIT EX) Apply liberal amount topically to areas of skin irritation as needed. Ok to leave at bedside.  . sodium chloride 1 g tablet Take 1 g by mouth daily.   Marland Kitchen spironolactone (ALDACTONE) 50 MG tablet Take 50 mg by mouth daily.  Marland Kitchen torsemide (DEMADEX) 10 MG tablet Take 10 mg by mouth daily.   Marland Kitchen triamcinolone (NASACORT ALLERGY 24HR) 55 MCG/ACT AERO nasal inhaler Place 1 spray into the nose daily.  Marland Kitchen UNABLE TO FIND Diet Type: Regular. No Sodium Restriction. Gatorade on lunch and supper trays for hyponatremia  . venlafaxine XR (EFFEXOR-XR) 75 MG 24 hr capsule Take 75 mg by mouth daily  with breakfast.   No facility-administered encounter medications on file as of 02/26/2018.     Review of Systems  GENERAL: No change in appetite, no fatigue, no weight changes, no fever, chills or weakness MOUTH and THROAT: Denies oral discomfort, gingival pain or bleeding, pain from teeth or hoarseness   RESPIRATORY: no cough, SOB, DOE, wheezing, hemoptysis CARDIAC: No chest pain, edema or palpitations GI: No abdominal pain, diarrhea, constipation, heart burn, nausea or vomiting GU: Denies dysuria, frequency, hematuria, incontinence, or discharge NEUROLOGICAL: Denies dizziness, syncope, numbness, or headache PSYCHIATRIC: Denies feelings of depression or anxiety. No report of hallucinations, insomnia, paranoia, or agitation   Immunization History  Administered Date(s) Administered  . Influenza Split 09/30/2014  . Influenza Whole 01/08/2004, 10/08/2006, 11/22/2008  . Influenza-Unspecified 08/13/2013, 09/21/2015, 09/25/2016, 10/09/2017  . PPD Test 01/08/2016, 11/07/2017  . Pneumococcal Conjugate-13 08/13/2013  . Pneumococcal Polysaccharide-23 10/24/2000, 11/30/2014  . Td 01/07/2001  . Zoster  01/07/2006   Pertinent  Health Maintenance Due  Topic Date Due  . INFLUENZA VACCINE  Completed  . DEXA SCAN  Completed  . PNA vac Low Risk Adult  Completed   No flowsheet data found.   Vitals:   02/26/18 1313  BP: 139/76  Pulse: 84  Resp: 18  Temp: 98.5 F (36.9 C)  TempSrc: Oral  SpO2: 95%  Weight: 178 lb 4.8 oz (80.9 kg)  Height: 5\' 1"  (1.549 m)   Body mass index is 33.69 kg/m.  Physical Exam  GENERAL APPEARANCE: Well nourished. In no acute distress. Obese SKIN:  Skin is warm and dry.  MOUTH and THROAT: Lips are without lesions. Oral mucosa is moist and without lesions.  RESPIRATORY: Breathing is even & unlabored, BS CTAB CARDIAC: RRR, no murmur,no extra heart sounds, no edema GI: Abdomen soft, normal BS, no masses, no tenderness EXTREMITIES:  Left hand contracted with splint, Left leg with brace NEUROLOGICAL: There is no tremor. Speech is clear.  Alert and oriented X 3. Left hemiplegia, slight left facial droop PSYCHIATRIC:  Affect and behavior are appropriate   Labs reviewed: Recent Labs    04/24/17 0525  10/17/17 0035 01/08/18 0615 02/17/18 0601  NA 134*   < > 135 138 135  K 4.0   < > 4.3 3.8 4.0  CL 96*   < > 98 103 101  CO2 31   < > 28 30 30   GLUCOSE 96   < > 82 86 83  BUN 27*   < > 14 14 14   CREATININE 0.97   < > 0.77 0.78 0.81  CALCIUM 9.2   < > 9.2 9.7 9.3  MG 1.8  --   --   --   --    < > = values in this interval not displayed.   Recent Labs    08/18/17 1356 08/21/17 0620 01/08/18 0615  AST 33 14* 25  ALT 34 17 66*  ALKPHOS 229* 163* 162*  BILITOT 0.7 0.6 0.5  PROT 6.1* 5.7* 6.1*  ALBUMIN 2.4* 2.1* 3.0*   Recent Labs    08/18/17 1356 08/21/17 0620 01/08/18 0615  WBC 11.8* 8.3 5.8  NEUTROABS 9.1* 5.8 3.1  HGB 10.0* 9.7* 10.9*  HCT 29.4* 28.5* 34.9*  MCV 92.2 92.5 95.6  PLT 278 343 203   Lab Results  Component Value Date   TSH 2.887 01/08/2018   Lab Results  Component Value Date   HGBA1C 5.6 06/23/2009   Lab Results    Component Value Date   CHOL 177 04/24/2017  HDL 39 (L) 04/24/2017   LDLCALC 121 (H) 04/24/2017   LDLDIRECT 161.4 12/14/2008   TRIG 86 04/24/2017   CHOLHDL 4.5 04/24/2017    Significant Diagnostic Results in last 30 days:   Assessment/Plan  1. Cerebral artery occlusion with cerebral infarction (HCC) - stable, continue Aggrenox ER 12-hour 25-200 mg 1 capsule every 12 hours  2. Essential hypertension, benign -Well-controlled, continue carvedilol 3.125 mg 1 tab twice a day  3. OAB (overactive bladder) -Continue oxybutynin 5 mg 1 tab twice a day  4. Moderate persistent asthma without complication -No wheezing nor SOB, continue montelukast 10 mg 1 tab at bedtime, Brio Ellipta 100-25 mcg/dose 1 inhalation daily, ipratropium-albuterol nebulization as needed  5. SIADH (syndrome of inappropriate ADH production) (HCC) -Continue sodium chloride 1 g 1 tab daily, demeclocycline 300 mg 1 tab twice a day Spironolactone 50 mg 1 tab daily, torsemide 10 mg 1 tab daily  6. Posttraumatic stress disorder -Mood is a stable, continue prazosin 1 mg keep 3 capsules = 3 mg at bedtime  7. Gastroesophageal reflux disease without esophagitis -Continue omeprazole 20 mg 1 capsule twice a day  8. Major depressive disorder with single episode, remission status unspecified -Continue venlafaxine ER 24-hour 75 mg 1 capsule daily  9. Other specified hypothyroidism Lab Results  Component Value Date   TSH 2.887 01/08/2018  -Continue levothyroxine 75 mcg 1 tab daily  10. Neuropathy -Continue gabapentin 600 mg every 8 hours  11. Mood disorder (HCC) -Continue Depakote sprinkles delayed release 125 mg 1 capsule twice a day     I have filled out patient's discharge paperwork.  Total discharge time: Greater than 30 minutes Greater than 50% was spent in counseling and coordination of care.  Discharge time involved coordination of the discharge process with Education officer, museum and  nursing  staff.     Durenda Age, NP [facility] [phone] (Monday-Friday 8:00 a.m. - 5:00 p.m.) [phone] (after hours)

## 2018-02-27 ENCOUNTER — Other Ambulatory Visit (HOSPITAL_COMMUNITY): Payer: Self-pay | Admitting: Internal Medicine

## 2018-02-27 ENCOUNTER — Other Ambulatory Visit: Payer: Self-pay | Admitting: Internal Medicine

## 2018-02-27 DIAGNOSIS — L819 Disorder of pigmentation, unspecified: Secondary | ICD-10-CM

## 2018-03-03 DIAGNOSIS — R1312 Dysphagia, oropharyngeal phase: Secondary | ICD-10-CM | POA: Diagnosis not present

## 2018-03-03 DIAGNOSIS — R41841 Cognitive communication deficit: Secondary | ICD-10-CM | POA: Diagnosis not present

## 2018-03-03 DIAGNOSIS — R2689 Other abnormalities of gait and mobility: Secondary | ICD-10-CM | POA: Diagnosis not present

## 2018-03-03 DIAGNOSIS — I69354 Hemiplegia and hemiparesis following cerebral infarction affecting left non-dominant side: Secondary | ICD-10-CM | POA: Diagnosis not present

## 2018-03-03 DIAGNOSIS — M6281 Muscle weakness (generalized): Secondary | ICD-10-CM | POA: Diagnosis not present

## 2018-03-03 DIAGNOSIS — R293 Abnormal posture: Secondary | ICD-10-CM | POA: Diagnosis not present

## 2018-03-04 DIAGNOSIS — F418 Other specified anxiety disorders: Secondary | ICD-10-CM | POA: Diagnosis not present

## 2018-03-04 DIAGNOSIS — K219 Gastro-esophageal reflux disease without esophagitis: Secondary | ICD-10-CM | POA: Diagnosis not present

## 2018-03-04 DIAGNOSIS — M1990 Unspecified osteoarthritis, unspecified site: Secondary | ICD-10-CM | POA: Diagnosis not present

## 2018-03-04 DIAGNOSIS — E039 Hypothyroidism, unspecified: Secondary | ICD-10-CM | POA: Diagnosis not present

## 2018-03-04 DIAGNOSIS — I69354 Hemiplegia and hemiparesis following cerebral infarction affecting left non-dominant side: Secondary | ICD-10-CM | POA: Diagnosis not present

## 2018-03-08 DIAGNOSIS — R41841 Cognitive communication deficit: Secondary | ICD-10-CM | POA: Diagnosis not present

## 2018-03-08 DIAGNOSIS — M6281 Muscle weakness (generalized): Secondary | ICD-10-CM | POA: Diagnosis not present

## 2018-03-08 DIAGNOSIS — R293 Abnormal posture: Secondary | ICD-10-CM | POA: Diagnosis not present

## 2018-03-08 DIAGNOSIS — R1312 Dysphagia, oropharyngeal phase: Secondary | ICD-10-CM | POA: Diagnosis not present

## 2018-03-08 DIAGNOSIS — I69354 Hemiplegia and hemiparesis following cerebral infarction affecting left non-dominant side: Secondary | ICD-10-CM | POA: Diagnosis not present

## 2018-03-08 DIAGNOSIS — R2689 Other abnormalities of gait and mobility: Secondary | ICD-10-CM | POA: Diagnosis not present

## 2018-03-11 DIAGNOSIS — N189 Chronic kidney disease, unspecified: Secondary | ICD-10-CM | POA: Diagnosis not present

## 2018-03-11 DIAGNOSIS — D649 Anemia, unspecified: Secondary | ICD-10-CM | POA: Diagnosis not present

## 2018-03-19 ENCOUNTER — Other Ambulatory Visit: Payer: Self-pay | Admitting: *Deleted

## 2018-03-19 NOTE — Patient Outreach (Signed)
[facility]) Care Management  03/19/2018  [name] August 13, 1940 [id number]  Per PatientPing this patient admitted to skilled facility, noted that they are a [facility] patient.  This patient will be followed by [facility] in the community. [facility] will provide full case management services.  [facility] care management services are not appropriate at this time.   Plan to sign off. Will collaborate with [facility] as indicated.  Royetta Crochet. Laymond Purser, MSN, RN, [employer] , [place] [phone]) Business Cell  [phone]) Toll Free Office

## 2018-04-01 DIAGNOSIS — I69354 Hemiplegia and hemiparesis following cerebral infarction affecting left non-dominant side: Secondary | ICD-10-CM | POA: Diagnosis not present

## 2018-04-01 DIAGNOSIS — F418 Other specified anxiety disorders: Secondary | ICD-10-CM | POA: Diagnosis not present

## 2018-04-01 DIAGNOSIS — E039 Hypothyroidism, unspecified: Secondary | ICD-10-CM | POA: Diagnosis not present

## 2018-04-01 DIAGNOSIS — M1990 Unspecified osteoarthritis, unspecified site: Secondary | ICD-10-CM | POA: Diagnosis not present

## 2018-04-01 DIAGNOSIS — K219 Gastro-esophageal reflux disease without esophagitis: Secondary | ICD-10-CM | POA: Diagnosis not present

## 2018-04-07 ENCOUNTER — Other Ambulatory Visit: Payer: Self-pay | Admitting: Internal Medicine

## 2018-04-08 DIAGNOSIS — N189 Chronic kidney disease, unspecified: Secondary | ICD-10-CM | POA: Diagnosis not present

## 2018-04-08 DIAGNOSIS — Z79899 Other long term (current) drug therapy: Secondary | ICD-10-CM | POA: Diagnosis not present

## 2018-04-08 DIAGNOSIS — E039 Hypothyroidism, unspecified: Secondary | ICD-10-CM | POA: Diagnosis not present

## 2018-04-08 DIAGNOSIS — D519 Vitamin B12 deficiency anemia, unspecified: Secondary | ICD-10-CM | POA: Diagnosis not present

## 2018-07-01 DIAGNOSIS — K219 Gastro-esophageal reflux disease without esophagitis: Secondary | ICD-10-CM | POA: Diagnosis not present

## 2018-07-01 DIAGNOSIS — I10 Essential (primary) hypertension: Secondary | ICD-10-CM | POA: Diagnosis not present

## 2018-07-01 DIAGNOSIS — F418 Other specified anxiety disorders: Secondary | ICD-10-CM | POA: Diagnosis not present

## 2018-07-01 DIAGNOSIS — E785 Hyperlipidemia, unspecified: Secondary | ICD-10-CM | POA: Diagnosis not present

## 2018-07-01 DIAGNOSIS — E039 Hypothyroidism, unspecified: Secondary | ICD-10-CM | POA: Diagnosis not present

## 2018-07-01 DIAGNOSIS — I69359 Hemiplegia and hemiparesis following cerebral infarction affecting unspecified side: Secondary | ICD-10-CM | POA: Diagnosis not present

## 2018-07-01 DIAGNOSIS — I739 Peripheral vascular disease, unspecified: Secondary | ICD-10-CM | POA: Diagnosis not present

## 2018-07-22 DIAGNOSIS — M1990 Unspecified osteoarthritis, unspecified site: Secondary | ICD-10-CM | POA: Diagnosis not present

## 2018-07-22 DIAGNOSIS — E039 Hypothyroidism, unspecified: Secondary | ICD-10-CM | POA: Diagnosis not present

## 2018-07-22 DIAGNOSIS — M170 Bilateral primary osteoarthritis of knee: Secondary | ICD-10-CM | POA: Diagnosis not present

## 2018-07-22 DIAGNOSIS — I6990 Unspecified sequelae of unspecified cerebrovascular disease: Secondary | ICD-10-CM | POA: Diagnosis not present

## 2018-07-22 DIAGNOSIS — K219 Gastro-esophageal reflux disease without esophagitis: Secondary | ICD-10-CM | POA: Diagnosis not present

## 2018-07-22 DIAGNOSIS — F418 Other specified anxiety disorders: Secondary | ICD-10-CM | POA: Diagnosis not present

## 2018-07-22 DIAGNOSIS — J45909 Unspecified asthma, uncomplicated: Secondary | ICD-10-CM | POA: Diagnosis not present

## 2018-07-29 DIAGNOSIS — M170 Bilateral primary osteoarthritis of knee: Secondary | ICD-10-CM | POA: Diagnosis not present

## 2018-07-29 DIAGNOSIS — J45909 Unspecified asthma, uncomplicated: Secondary | ICD-10-CM | POA: Diagnosis not present

## 2018-07-29 DIAGNOSIS — M1990 Unspecified osteoarthritis, unspecified site: Secondary | ICD-10-CM | POA: Diagnosis not present

## 2018-07-29 DIAGNOSIS — F418 Other specified anxiety disorders: Secondary | ICD-10-CM | POA: Diagnosis not present

## 2018-07-29 DIAGNOSIS — E039 Hypothyroidism, unspecified: Secondary | ICD-10-CM | POA: Diagnosis not present

## 2018-07-29 DIAGNOSIS — I6990 Unspecified sequelae of unspecified cerebrovascular disease: Secondary | ICD-10-CM | POA: Diagnosis not present

## 2018-07-29 DIAGNOSIS — K219 Gastro-esophageal reflux disease without esophagitis: Secondary | ICD-10-CM | POA: Diagnosis not present

## 2018-09-02 DIAGNOSIS — G629 Polyneuropathy, unspecified: Secondary | ICD-10-CM | POA: Diagnosis not present

## 2018-09-02 DIAGNOSIS — E039 Hypothyroidism, unspecified: Secondary | ICD-10-CM | POA: Diagnosis not present

## 2018-09-02 DIAGNOSIS — M1990 Unspecified osteoarthritis, unspecified site: Secondary | ICD-10-CM | POA: Diagnosis not present

## 2018-09-02 DIAGNOSIS — F4310 Post-traumatic stress disorder, unspecified: Secondary | ICD-10-CM | POA: Diagnosis not present

## 2018-09-02 DIAGNOSIS — E785 Hyperlipidemia, unspecified: Secondary | ICD-10-CM | POA: Diagnosis not present

## 2018-09-02 DIAGNOSIS — I10 Essential (primary) hypertension: Secondary | ICD-10-CM | POA: Diagnosis not present

## 2018-09-02 DIAGNOSIS — E222 Syndrome of inappropriate secretion of antidiuretic hormone: Secondary | ICD-10-CM | POA: Diagnosis not present

## 2018-09-02 DIAGNOSIS — I6990 Unspecified sequelae of unspecified cerebrovascular disease: Secondary | ICD-10-CM | POA: Diagnosis not present

## 2018-09-08 DIAGNOSIS — Z8719 Personal history of other diseases of the digestive system: Secondary | ICD-10-CM | POA: Diagnosis not present

## 2018-09-08 DIAGNOSIS — K8020 Calculus of gallbladder without cholecystitis without obstruction: Secondary | ICD-10-CM | POA: Diagnosis not present

## 2018-09-08 DIAGNOSIS — R109 Unspecified abdominal pain: Secondary | ICD-10-CM | POA: Diagnosis not present

## 2018-09-10 DIAGNOSIS — K8020 Calculus of gallbladder without cholecystitis without obstruction: Secondary | ICD-10-CM | POA: Diagnosis not present

## 2018-09-10 DIAGNOSIS — N183 Chronic kidney disease, stage 3 (moderate): Secondary | ICD-10-CM | POA: Diagnosis not present

## 2018-09-10 DIAGNOSIS — N390 Urinary tract infection, site not specified: Secondary | ICD-10-CM | POA: Diagnosis not present

## 2018-09-10 DIAGNOSIS — R319 Hematuria, unspecified: Secondary | ICD-10-CM | POA: Diagnosis not present

## 2018-09-10 DIAGNOSIS — Z8719 Personal history of other diseases of the digestive system: Secondary | ICD-10-CM | POA: Diagnosis not present

## 2018-09-10 DIAGNOSIS — R109 Unspecified abdominal pain: Secondary | ICD-10-CM | POA: Diagnosis not present

## 2018-09-15 DIAGNOSIS — I69354 Hemiplegia and hemiparesis following cerebral infarction affecting left non-dominant side: Secondary | ICD-10-CM | POA: Diagnosis not present

## 2018-09-17 DIAGNOSIS — D649 Anemia, unspecified: Secondary | ICD-10-CM | POA: Diagnosis not present

## 2018-09-17 DIAGNOSIS — N183 Chronic kidney disease, stage 3 (moderate): Secondary | ICD-10-CM | POA: Diagnosis not present

## 2018-09-17 DIAGNOSIS — Z79899 Other long term (current) drug therapy: Secondary | ICD-10-CM | POA: Diagnosis not present

## 2018-09-18 IMAGING — NM NM HEPATOBILIARY IMAGE, INC GB
2 series · 7 of 7 positions shown · non-contrast
Comparison: 11/28/2014.

CLINICAL DATA: History of cholelithiasis with right upper quadrant
pain and nausea and vomiting

EXAM:
NUCLEAR MEDICINE HEPATOBILIARY IMAGING
TECHNIQUE: Sequential images of the abdomen were obtained [DATE] minutes
following intravenous administration of radiopharmaceutical.
RADIOPHARMACEUTICALS:  5.17 mCi Dc-FFm  Choletec IV

[Series 1000: hepatobiliary scan · 9.59mm/px · 6 of 60 frames shown]
[frame 6/60]
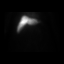
[frame 16/60]
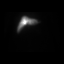
[frame 26/60]
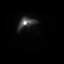
[frame 36/60]
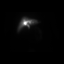
[frame 46/60]
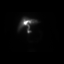
[frame 56/60]
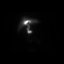

[Series 1000: gallbladder delays · 3.30mm/px · 1 of 1 slices shown]
[im 1/1]
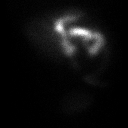

[7 of 7 positions shown; findings below may reference images not displayed]

FINDINGS: Prompt uptake and biliary excretion of activity by the liver is
seen. Gallbladder activity is visualized, consistent with patency of
cystic duct. The distention of the gallbladder is limited due to the
known cholelithiasis seen on prior CT examination. Biliary activity
passes into small bowel, consistent with patent common bile duct.
IMPRESSION: Normal uptake and excretion of biliary tracer.

Partial filling of the gallbladder related to underlying
cholelithiasis.

## 2018-10-01 DIAGNOSIS — U071 COVID-19: Secondary | ICD-10-CM | POA: Diagnosis not present

## 2018-10-07 DIAGNOSIS — M1990 Unspecified osteoarthritis, unspecified site: Secondary | ICD-10-CM | POA: Diagnosis not present

## 2018-10-07 DIAGNOSIS — I10 Essential (primary) hypertension: Secondary | ICD-10-CM | POA: Diagnosis not present

## 2018-10-07 DIAGNOSIS — E039 Hypothyroidism, unspecified: Secondary | ICD-10-CM | POA: Diagnosis not present

## 2018-10-07 DIAGNOSIS — K219 Gastro-esophageal reflux disease without esophagitis: Secondary | ICD-10-CM | POA: Diagnosis not present

## 2018-10-07 DIAGNOSIS — E785 Hyperlipidemia, unspecified: Secondary | ICD-10-CM | POA: Diagnosis not present

## 2018-10-07 DIAGNOSIS — J45909 Unspecified asthma, uncomplicated: Secondary | ICD-10-CM | POA: Diagnosis not present

## 2018-10-07 DIAGNOSIS — G629 Polyneuropathy, unspecified: Secondary | ICD-10-CM | POA: Diagnosis not present

## 2018-10-07 DIAGNOSIS — I6990 Unspecified sequelae of unspecified cerebrovascular disease: Secondary | ICD-10-CM | POA: Diagnosis not present

## 2018-10-08 DIAGNOSIS — M6281 Muscle weakness (generalized): Secondary | ICD-10-CM | POA: Diagnosis not present

## 2018-10-08 DIAGNOSIS — I69354 Hemiplegia and hemiparesis following cerebral infarction affecting left non-dominant side: Secondary | ICD-10-CM | POA: Diagnosis not present

## 2018-10-08 DIAGNOSIS — M24542 Contracture, left hand: Secondary | ICD-10-CM | POA: Diagnosis not present

## 2018-10-08 DIAGNOSIS — R262 Difficulty in walking, not elsewhere classified: Secondary | ICD-10-CM | POA: Diagnosis not present

## 2018-10-08 DIAGNOSIS — U071 COVID-19: Secondary | ICD-10-CM | POA: Diagnosis not present

## 2018-10-22 DIAGNOSIS — U071 COVID-19: Secondary | ICD-10-CM | POA: Diagnosis not present

## 2018-10-29 DIAGNOSIS — U071 COVID-19: Secondary | ICD-10-CM | POA: Diagnosis not present

## 2018-11-04 DIAGNOSIS — K219 Gastro-esophageal reflux disease without esophagitis: Secondary | ICD-10-CM | POA: Diagnosis not present

## 2018-11-04 DIAGNOSIS — M1990 Unspecified osteoarthritis, unspecified site: Secondary | ICD-10-CM | POA: Diagnosis not present

## 2018-11-04 DIAGNOSIS — G629 Polyneuropathy, unspecified: Secondary | ICD-10-CM | POA: Diagnosis not present

## 2018-11-04 DIAGNOSIS — I10 Essential (primary) hypertension: Secondary | ICD-10-CM | POA: Diagnosis not present

## 2018-11-04 DIAGNOSIS — E785 Hyperlipidemia, unspecified: Secondary | ICD-10-CM | POA: Diagnosis not present

## 2018-11-04 DIAGNOSIS — E539 Vitamin B deficiency, unspecified: Secondary | ICD-10-CM | POA: Diagnosis not present

## 2018-11-04 DIAGNOSIS — U071 COVID-19: Secondary | ICD-10-CM | POA: Diagnosis not present

## 2018-11-04 DIAGNOSIS — I69359 Hemiplegia and hemiparesis following cerebral infarction affecting unspecified side: Secondary | ICD-10-CM | POA: Diagnosis not present

## 2018-11-04 DIAGNOSIS — E039 Hypothyroidism, unspecified: Secondary | ICD-10-CM | POA: Diagnosis not present

## 2018-11-09 DIAGNOSIS — R262 Difficulty in walking, not elsewhere classified: Secondary | ICD-10-CM | POA: Diagnosis not present

## 2018-11-09 DIAGNOSIS — I69354 Hemiplegia and hemiparesis following cerebral infarction affecting left non-dominant side: Secondary | ICD-10-CM | POA: Diagnosis not present

## 2018-11-09 DIAGNOSIS — M24542 Contracture, left hand: Secondary | ICD-10-CM | POA: Diagnosis not present

## 2018-11-09 DIAGNOSIS — M6281 Muscle weakness (generalized): Secondary | ICD-10-CM | POA: Diagnosis not present

## 2018-11-11 DIAGNOSIS — U071 COVID-19: Secondary | ICD-10-CM | POA: Diagnosis not present

## 2018-11-17 DIAGNOSIS — I69354 Hemiplegia and hemiparesis following cerebral infarction affecting left non-dominant side: Secondary | ICD-10-CM | POA: Diagnosis not present

## 2018-11-17 DIAGNOSIS — I872 Venous insufficiency (chronic) (peripheral): Secondary | ICD-10-CM | POA: Diagnosis not present

## 2018-12-02 DIAGNOSIS — G629 Polyneuropathy, unspecified: Secondary | ICD-10-CM | POA: Diagnosis not present

## 2018-12-02 DIAGNOSIS — E039 Hypothyroidism, unspecified: Secondary | ICD-10-CM | POA: Diagnosis not present

## 2018-12-02 DIAGNOSIS — E785 Hyperlipidemia, unspecified: Secondary | ICD-10-CM | POA: Diagnosis not present

## 2018-12-02 DIAGNOSIS — I10 Essential (primary) hypertension: Secondary | ICD-10-CM | POA: Diagnosis not present

## 2018-12-02 DIAGNOSIS — M1990 Unspecified osteoarthritis, unspecified site: Secondary | ICD-10-CM | POA: Diagnosis not present

## 2018-12-02 DIAGNOSIS — I69354 Hemiplegia and hemiparesis following cerebral infarction affecting left non-dominant side: Secondary | ICD-10-CM | POA: Diagnosis not present

## 2018-12-02 DIAGNOSIS — K219 Gastro-esophageal reflux disease without esophagitis: Secondary | ICD-10-CM | POA: Diagnosis not present

## 2018-12-07 DIAGNOSIS — U071 COVID-19: Secondary | ICD-10-CM | POA: Diagnosis not present

## 2018-12-10 DIAGNOSIS — R262 Difficulty in walking, not elsewhere classified: Secondary | ICD-10-CM | POA: Diagnosis not present

## 2018-12-10 DIAGNOSIS — M6281 Muscle weakness (generalized): Secondary | ICD-10-CM | POA: Diagnosis not present

## 2018-12-10 DIAGNOSIS — M216X2 Other acquired deformities of left foot: Secondary | ICD-10-CM | POA: Diagnosis not present

## 2018-12-11 DIAGNOSIS — I69354 Hemiplegia and hemiparesis following cerebral infarction affecting left non-dominant side: Secondary | ICD-10-CM | POA: Diagnosis not present

## 2018-12-11 DIAGNOSIS — R262 Difficulty in walking, not elsewhere classified: Secondary | ICD-10-CM | POA: Diagnosis not present

## 2018-12-11 DIAGNOSIS — M6281 Muscle weakness (generalized): Secondary | ICD-10-CM | POA: Diagnosis not present

## 2018-12-15 DIAGNOSIS — U071 COVID-19: Secondary | ICD-10-CM | POA: Diagnosis not present

## 2018-12-22 DIAGNOSIS — U071 COVID-19: Secondary | ICD-10-CM | POA: Diagnosis not present

## 2018-12-29 DIAGNOSIS — U071 COVID-19: Secondary | ICD-10-CM | POA: Diagnosis not present

## 2018-12-30 DIAGNOSIS — M1990 Unspecified osteoarthritis, unspecified site: Secondary | ICD-10-CM | POA: Diagnosis not present

## 2018-12-30 DIAGNOSIS — I69359 Hemiplegia and hemiparesis following cerebral infarction affecting unspecified side: Secondary | ICD-10-CM | POA: Diagnosis not present

## 2018-12-30 DIAGNOSIS — F329 Major depressive disorder, single episode, unspecified: Secondary | ICD-10-CM | POA: Diagnosis not present

## 2018-12-30 DIAGNOSIS — E039 Hypothyroidism, unspecified: Secondary | ICD-10-CM | POA: Diagnosis not present

## 2018-12-30 DIAGNOSIS — J45909 Unspecified asthma, uncomplicated: Secondary | ICD-10-CM | POA: Diagnosis not present

## 2019-01-12 DIAGNOSIS — U071 COVID-19: Secondary | ICD-10-CM | POA: Diagnosis not present

## 2019-01-26 DIAGNOSIS — U071 COVID-19: Secondary | ICD-10-CM | POA: Diagnosis not present

## 2019-02-03 DIAGNOSIS — M1990 Unspecified osteoarthritis, unspecified site: Secondary | ICD-10-CM | POA: Diagnosis not present

## 2019-02-03 DIAGNOSIS — E785 Hyperlipidemia, unspecified: Secondary | ICD-10-CM | POA: Diagnosis not present

## 2019-02-03 DIAGNOSIS — G629 Polyneuropathy, unspecified: Secondary | ICD-10-CM | POA: Diagnosis not present

## 2019-02-03 DIAGNOSIS — I69359 Hemiplegia and hemiparesis following cerebral infarction affecting unspecified side: Secondary | ICD-10-CM | POA: Diagnosis not present

## 2019-02-03 DIAGNOSIS — E039 Hypothyroidism, unspecified: Secondary | ICD-10-CM | POA: Diagnosis not present

## 2019-02-03 DIAGNOSIS — K219 Gastro-esophageal reflux disease without esophagitis: Secondary | ICD-10-CM | POA: Diagnosis not present

## 2019-02-03 DIAGNOSIS — I10 Essential (primary) hypertension: Secondary | ICD-10-CM | POA: Diagnosis not present

## 2019-02-04 DIAGNOSIS — M25552 Pain in left hip: Secondary | ICD-10-CM | POA: Diagnosis not present

## 2019-02-05 DIAGNOSIS — I69354 Hemiplegia and hemiparesis following cerebral infarction affecting left non-dominant side: Secondary | ICD-10-CM | POA: Diagnosis not present

## 2019-02-05 DIAGNOSIS — Z736 Limitation of activities due to disability: Secondary | ICD-10-CM | POA: Diagnosis not present

## 2019-02-17 DIAGNOSIS — I69354 Hemiplegia and hemiparesis following cerebral infarction affecting left non-dominant side: Secondary | ICD-10-CM | POA: Diagnosis not present

## 2019-02-17 DIAGNOSIS — M545 Low back pain: Secondary | ICD-10-CM | POA: Diagnosis not present

## 2019-02-17 DIAGNOSIS — Z736 Limitation of activities due to disability: Secondary | ICD-10-CM | POA: Diagnosis not present

## 2019-03-03 DIAGNOSIS — E039 Hypothyroidism, unspecified: Secondary | ICD-10-CM | POA: Diagnosis not present

## 2019-03-03 DIAGNOSIS — I10 Essential (primary) hypertension: Secondary | ICD-10-CM | POA: Diagnosis not present

## 2019-03-03 DIAGNOSIS — K219 Gastro-esophageal reflux disease without esophagitis: Secondary | ICD-10-CM | POA: Diagnosis not present

## 2019-03-03 DIAGNOSIS — M1990 Unspecified osteoarthritis, unspecified site: Secondary | ICD-10-CM | POA: Diagnosis not present

## 2019-03-03 DIAGNOSIS — G629 Polyneuropathy, unspecified: Secondary | ICD-10-CM | POA: Diagnosis not present

## 2019-03-03 DIAGNOSIS — I69359 Hemiplegia and hemiparesis following cerebral infarction affecting unspecified side: Secondary | ICD-10-CM | POA: Diagnosis not present

## 2019-03-09 DIAGNOSIS — U071 COVID-19: Secondary | ICD-10-CM | POA: Diagnosis not present

## 2019-03-10 DIAGNOSIS — E039 Hypothyroidism, unspecified: Secondary | ICD-10-CM | POA: Diagnosis not present

## 2019-03-10 DIAGNOSIS — K7460 Unspecified cirrhosis of liver: Secondary | ICD-10-CM | POA: Diagnosis not present

## 2019-03-10 DIAGNOSIS — D519 Vitamin B12 deficiency anemia, unspecified: Secondary | ICD-10-CM | POA: Diagnosis not present

## 2019-03-10 DIAGNOSIS — Z79899 Other long term (current) drug therapy: Secondary | ICD-10-CM | POA: Diagnosis not present

## 2019-03-10 DIAGNOSIS — E213 Hyperparathyroidism, unspecified: Secondary | ICD-10-CM | POA: Diagnosis not present

## 2019-03-10 DIAGNOSIS — D649 Anemia, unspecified: Secondary | ICD-10-CM | POA: Diagnosis not present

## 2019-03-11 DIAGNOSIS — I69354 Hemiplegia and hemiparesis following cerebral infarction affecting left non-dominant side: Secondary | ICD-10-CM | POA: Diagnosis not present

## 2019-03-11 DIAGNOSIS — M545 Low back pain: Secondary | ICD-10-CM | POA: Diagnosis not present

## 2019-03-11 DIAGNOSIS — Z736 Limitation of activities due to disability: Secondary | ICD-10-CM | POA: Diagnosis not present

## 2019-03-16 DIAGNOSIS — U071 COVID-19: Secondary | ICD-10-CM | POA: Diagnosis not present

## 2019-04-07 DIAGNOSIS — K219 Gastro-esophageal reflux disease without esophagitis: Secondary | ICD-10-CM | POA: Diagnosis not present

## 2019-04-07 DIAGNOSIS — I69359 Hemiplegia and hemiparesis following cerebral infarction affecting unspecified side: Secondary | ICD-10-CM | POA: Diagnosis not present

## 2019-04-07 DIAGNOSIS — E785 Hyperlipidemia, unspecified: Secondary | ICD-10-CM | POA: Diagnosis not present

## 2019-04-07 DIAGNOSIS — G629 Polyneuropathy, unspecified: Secondary | ICD-10-CM | POA: Diagnosis not present

## 2019-04-07 DIAGNOSIS — E039 Hypothyroidism, unspecified: Secondary | ICD-10-CM | POA: Diagnosis not present

## 2019-04-07 DIAGNOSIS — I10 Essential (primary) hypertension: Secondary | ICD-10-CM | POA: Diagnosis not present

## 2019-04-07 DIAGNOSIS — M1990 Unspecified osteoarthritis, unspecified site: Secondary | ICD-10-CM | POA: Diagnosis not present

## 2019-04-08 DIAGNOSIS — I69354 Hemiplegia and hemiparesis following cerebral infarction affecting left non-dominant side: Secondary | ICD-10-CM | POA: Diagnosis not present

## 2019-04-08 DIAGNOSIS — Z736 Limitation of activities due to disability: Secondary | ICD-10-CM | POA: Diagnosis not present

## 2019-04-08 DIAGNOSIS — M545 Low back pain: Secondary | ICD-10-CM | POA: Diagnosis not present

## 2019-04-20 DIAGNOSIS — B351 Tinea unguium: Secondary | ICD-10-CM | POA: Diagnosis not present

## 2019-05-05 DIAGNOSIS — I10 Essential (primary) hypertension: Secondary | ICD-10-CM | POA: Diagnosis not present

## 2019-05-05 DIAGNOSIS — G629 Polyneuropathy, unspecified: Secondary | ICD-10-CM | POA: Diagnosis not present

## 2019-05-05 DIAGNOSIS — K219 Gastro-esophageal reflux disease without esophagitis: Secondary | ICD-10-CM | POA: Diagnosis not present

## 2019-05-05 DIAGNOSIS — E039 Hypothyroidism, unspecified: Secondary | ICD-10-CM | POA: Diagnosis not present

## 2019-05-05 DIAGNOSIS — M1990 Unspecified osteoarthritis, unspecified site: Secondary | ICD-10-CM | POA: Diagnosis not present

## 2019-05-05 DIAGNOSIS — I69359 Hemiplegia and hemiparesis following cerebral infarction affecting unspecified side: Secondary | ICD-10-CM | POA: Diagnosis not present

## 2019-05-10 DIAGNOSIS — I69354 Hemiplegia and hemiparesis following cerebral infarction affecting left non-dominant side: Secondary | ICD-10-CM | POA: Diagnosis not present

## 2019-05-10 DIAGNOSIS — M545 Low back pain: Secondary | ICD-10-CM | POA: Diagnosis not present

## 2019-05-10 DIAGNOSIS — M2560 Stiffness of unspecified joint, not elsewhere classified: Secondary | ICD-10-CM | POA: Diagnosis not present

## 2019-05-10 DIAGNOSIS — Z736 Limitation of activities due to disability: Secondary | ICD-10-CM | POA: Diagnosis not present

## 2019-05-10 DIAGNOSIS — M24542 Contracture, left hand: Secondary | ICD-10-CM | POA: Diagnosis not present

## 2019-05-26 DIAGNOSIS — H35371 Puckering of macula, right eye: Secondary | ICD-10-CM | POA: Diagnosis not present

## 2019-06-02 DIAGNOSIS — J45909 Unspecified asthma, uncomplicated: Secondary | ICD-10-CM | POA: Diagnosis not present

## 2019-06-02 DIAGNOSIS — M1990 Unspecified osteoarthritis, unspecified site: Secondary | ICD-10-CM | POA: Diagnosis not present

## 2019-06-02 DIAGNOSIS — E039 Hypothyroidism, unspecified: Secondary | ICD-10-CM | POA: Diagnosis not present

## 2019-06-02 DIAGNOSIS — F329 Major depressive disorder, single episode, unspecified: Secondary | ICD-10-CM | POA: Diagnosis not present

## 2019-06-02 DIAGNOSIS — I69359 Hemiplegia and hemiparesis following cerebral infarction affecting unspecified side: Secondary | ICD-10-CM | POA: Diagnosis not present

## 2019-06-08 DIAGNOSIS — M24542 Contracture, left hand: Secondary | ICD-10-CM | POA: Diagnosis not present

## 2019-06-08 DIAGNOSIS — M2560 Stiffness of unspecified joint, not elsewhere classified: Secondary | ICD-10-CM | POA: Diagnosis not present

## 2019-06-08 DIAGNOSIS — I69354 Hemiplegia and hemiparesis following cerebral infarction affecting left non-dominant side: Secondary | ICD-10-CM | POA: Diagnosis not present

## 2019-06-30 DIAGNOSIS — E039 Hypothyroidism, unspecified: Secondary | ICD-10-CM | POA: Diagnosis not present

## 2019-06-30 DIAGNOSIS — M1990 Unspecified osteoarthritis, unspecified site: Secondary | ICD-10-CM | POA: Diagnosis not present

## 2019-06-30 DIAGNOSIS — F329 Major depressive disorder, single episode, unspecified: Secondary | ICD-10-CM | POA: Diagnosis not present

## 2019-06-30 DIAGNOSIS — D649 Anemia, unspecified: Secondary | ICD-10-CM | POA: Diagnosis not present

## 2019-06-30 DIAGNOSIS — I69359 Hemiplegia and hemiparesis following cerebral infarction affecting unspecified side: Secondary | ICD-10-CM | POA: Diagnosis not present

## 2019-07-12 DIAGNOSIS — M2560 Stiffness of unspecified joint, not elsewhere classified: Secondary | ICD-10-CM | POA: Diagnosis not present

## 2019-07-12 DIAGNOSIS — I69354 Hemiplegia and hemiparesis following cerebral infarction affecting left non-dominant side: Secondary | ICD-10-CM | POA: Diagnosis not present

## 2019-07-12 DIAGNOSIS — M24542 Contracture, left hand: Secondary | ICD-10-CM | POA: Diagnosis not present

## 2019-07-19 DIAGNOSIS — M79671 Pain in right foot: Secondary | ICD-10-CM | POA: Diagnosis not present

## 2019-07-20 DIAGNOSIS — M79671 Pain in right foot: Secondary | ICD-10-CM | POA: Diagnosis not present

## 2019-08-04 DIAGNOSIS — D649 Anemia, unspecified: Secondary | ICD-10-CM | POA: Diagnosis not present

## 2019-08-04 DIAGNOSIS — E039 Hypothyroidism, unspecified: Secondary | ICD-10-CM | POA: Diagnosis not present

## 2019-08-04 DIAGNOSIS — I10 Essential (primary) hypertension: Secondary | ICD-10-CM | POA: Diagnosis not present

## 2019-08-04 DIAGNOSIS — R7989 Other specified abnormal findings of blood chemistry: Secondary | ICD-10-CM | POA: Diagnosis not present

## 2019-08-04 DIAGNOSIS — G629 Polyneuropathy, unspecified: Secondary | ICD-10-CM | POA: Diagnosis not present

## 2019-08-04 DIAGNOSIS — E785 Hyperlipidemia, unspecified: Secondary | ICD-10-CM | POA: Diagnosis not present

## 2019-08-04 DIAGNOSIS — I6990 Unspecified sequelae of unspecified cerebrovascular disease: Secondary | ICD-10-CM | POA: Diagnosis not present

## 2019-08-04 DIAGNOSIS — F329 Major depressive disorder, single episode, unspecified: Secondary | ICD-10-CM | POA: Diagnosis not present

## 2019-08-04 DIAGNOSIS — K219 Gastro-esophageal reflux disease without esophagitis: Secondary | ICD-10-CM | POA: Diagnosis not present

## 2019-08-04 DIAGNOSIS — R6889 Other general symptoms and signs: Secondary | ICD-10-CM | POA: Diagnosis not present

## 2019-08-04 DIAGNOSIS — J45909 Unspecified asthma, uncomplicated: Secondary | ICD-10-CM | POA: Diagnosis not present

## 2019-08-05 DIAGNOSIS — N390 Urinary tract infection, site not specified: Secondary | ICD-10-CM | POA: Diagnosis not present

## 2019-08-11 DIAGNOSIS — L570 Actinic keratosis: Secondary | ICD-10-CM | POA: Diagnosis not present

## 2019-08-11 DIAGNOSIS — B372 Candidiasis of skin and nail: Secondary | ICD-10-CM | POA: Diagnosis not present

## 2019-09-01 DIAGNOSIS — I69359 Hemiplegia and hemiparesis following cerebral infarction affecting unspecified side: Secondary | ICD-10-CM | POA: Diagnosis not present

## 2019-09-01 DIAGNOSIS — K219 Gastro-esophageal reflux disease without esophagitis: Secondary | ICD-10-CM | POA: Diagnosis not present

## 2019-09-01 DIAGNOSIS — E785 Hyperlipidemia, unspecified: Secondary | ICD-10-CM | POA: Diagnosis not present

## 2019-09-01 DIAGNOSIS — J45909 Unspecified asthma, uncomplicated: Secondary | ICD-10-CM | POA: Diagnosis not present

## 2019-09-01 DIAGNOSIS — F4310 Post-traumatic stress disorder, unspecified: Secondary | ICD-10-CM | POA: Diagnosis not present

## 2019-09-01 DIAGNOSIS — I10 Essential (primary) hypertension: Secondary | ICD-10-CM | POA: Diagnosis not present

## 2019-09-01 DIAGNOSIS — G629 Polyneuropathy, unspecified: Secondary | ICD-10-CM | POA: Diagnosis not present

## 2019-09-08 DIAGNOSIS — E559 Vitamin D deficiency, unspecified: Secondary | ICD-10-CM | POA: Diagnosis not present

## 2019-09-08 DIAGNOSIS — R945 Abnormal results of liver function studies: Secondary | ICD-10-CM | POA: Diagnosis not present

## 2019-09-08 DIAGNOSIS — I10 Essential (primary) hypertension: Secondary | ICD-10-CM | POA: Diagnosis not present

## 2019-09-23 DIAGNOSIS — D649 Anemia, unspecified: Secondary | ICD-10-CM | POA: Diagnosis not present

## 2019-09-23 DIAGNOSIS — I69354 Hemiplegia and hemiparesis following cerebral infarction affecting left non-dominant side: Secondary | ICD-10-CM | POA: Diagnosis not present

## 2019-09-23 DIAGNOSIS — M6281 Muscle weakness (generalized): Secondary | ICD-10-CM | POA: Diagnosis not present

## 2019-09-28 DIAGNOSIS — E222 Syndrome of inappropriate secretion of antidiuretic hormone: Secondary | ICD-10-CM | POA: Diagnosis not present

## 2019-09-28 DIAGNOSIS — I10 Essential (primary) hypertension: Secondary | ICD-10-CM | POA: Diagnosis not present

## 2019-09-28 DIAGNOSIS — I69354 Hemiplegia and hemiparesis following cerebral infarction affecting left non-dominant side: Secondary | ICD-10-CM | POA: Diagnosis not present

## 2019-09-28 DIAGNOSIS — J45998 Other asthma: Secondary | ICD-10-CM | POA: Diagnosis not present

## 2019-09-28 DIAGNOSIS — K5909 Other constipation: Secondary | ICD-10-CM | POA: Diagnosis not present

## 2019-09-28 DIAGNOSIS — M6289 Other specified disorders of muscle: Secondary | ICD-10-CM | POA: Diagnosis not present

## 2019-09-28 DIAGNOSIS — K219 Gastro-esophageal reflux disease without esophagitis: Secondary | ICD-10-CM | POA: Diagnosis not present

## 2019-09-28 DIAGNOSIS — F418 Other specified anxiety disorders: Secondary | ICD-10-CM | POA: Diagnosis not present

## 2019-09-28 DIAGNOSIS — I6389 Other cerebral infarction: Secondary | ICD-10-CM | POA: Diagnosis not present

## 2019-09-28 DIAGNOSIS — H04129 Dry eye syndrome of unspecified lacrimal gland: Secondary | ICD-10-CM | POA: Diagnosis not present

## 2019-09-28 DIAGNOSIS — M1388 Other specified arthritis, other site: Secondary | ICD-10-CM | POA: Diagnosis not present

## 2019-09-28 DIAGNOSIS — E7849 Other hyperlipidemia: Secondary | ICD-10-CM | POA: Diagnosis not present

## 2019-10-07 DIAGNOSIS — B351 Tinea unguium: Secondary | ICD-10-CM | POA: Diagnosis not present

## 2019-10-07 DIAGNOSIS — Q845 Enlarged and hypertrophic nails: Secondary | ICD-10-CM | POA: Diagnosis not present

## 2019-10-11 DIAGNOSIS — M6281 Muscle weakness (generalized): Secondary | ICD-10-CM | POA: Diagnosis not present

## 2019-10-11 DIAGNOSIS — I69354 Hemiplegia and hemiparesis following cerebral infarction affecting left non-dominant side: Secondary | ICD-10-CM | POA: Diagnosis not present

## 2019-10-15 DIAGNOSIS — L84 Corns and callosities: Secondary | ICD-10-CM | POA: Diagnosis not present

## 2019-10-15 DIAGNOSIS — I6389 Other cerebral infarction: Secondary | ICD-10-CM | POA: Diagnosis not present

## 2019-10-15 DIAGNOSIS — I10 Essential (primary) hypertension: Secondary | ICD-10-CM | POA: Diagnosis not present

## 2019-10-15 DIAGNOSIS — G8929 Other chronic pain: Secondary | ICD-10-CM | POA: Diagnosis not present

## 2019-10-15 DIAGNOSIS — Z7189 Other specified counseling: Secondary | ICD-10-CM | POA: Diagnosis not present

## 2019-10-15 DIAGNOSIS — J45998 Other asthma: Secondary | ICD-10-CM | POA: Diagnosis not present

## 2019-10-15 DIAGNOSIS — I69354 Hemiplegia and hemiparesis following cerebral infarction affecting left non-dominant side: Secondary | ICD-10-CM | POA: Diagnosis not present

## 2019-10-27 DIAGNOSIS — G8929 Other chronic pain: Secondary | ICD-10-CM | POA: Diagnosis not present

## 2019-10-27 DIAGNOSIS — T148XXA Other injury of unspecified body region, initial encounter: Secondary | ICD-10-CM | POA: Diagnosis not present

## 2019-11-09 DIAGNOSIS — M6281 Muscle weakness (generalized): Secondary | ICD-10-CM | POA: Diagnosis not present

## 2019-11-09 DIAGNOSIS — I69354 Hemiplegia and hemiparesis following cerebral infarction affecting left non-dominant side: Secondary | ICD-10-CM | POA: Diagnosis not present

## 2019-11-23 DIAGNOSIS — J40 Bronchitis, not specified as acute or chronic: Secondary | ICD-10-CM | POA: Diagnosis not present

## 2019-11-26 DIAGNOSIS — F418 Other specified anxiety disorders: Secondary | ICD-10-CM | POA: Diagnosis not present

## 2019-11-26 DIAGNOSIS — G8929 Other chronic pain: Secondary | ICD-10-CM | POA: Diagnosis not present

## 2019-11-26 DIAGNOSIS — E038 Other specified hypothyroidism: Secondary | ICD-10-CM | POA: Diagnosis not present

## 2019-11-26 DIAGNOSIS — K219 Gastro-esophageal reflux disease without esophagitis: Secondary | ICD-10-CM | POA: Diagnosis not present

## 2019-11-26 DIAGNOSIS — I10 Essential (primary) hypertension: Secondary | ICD-10-CM | POA: Diagnosis not present

## 2019-11-26 DIAGNOSIS — J40 Bronchitis, not specified as acute or chronic: Secondary | ICD-10-CM | POA: Diagnosis not present

## 2019-11-26 DIAGNOSIS — I6389 Other cerebral infarction: Secondary | ICD-10-CM | POA: Diagnosis not present

## 2019-11-26 DIAGNOSIS — J45998 Other asthma: Secondary | ICD-10-CM | POA: Diagnosis not present

## 2019-11-30 DIAGNOSIS — J40 Bronchitis, not specified as acute or chronic: Secondary | ICD-10-CM | POA: Diagnosis not present

## 2019-12-01 DIAGNOSIS — R059 Cough, unspecified: Secondary | ICD-10-CM | POA: Diagnosis not present

## 2019-12-09 DIAGNOSIS — I69354 Hemiplegia and hemiparesis following cerebral infarction affecting left non-dominant side: Secondary | ICD-10-CM | POA: Diagnosis not present

## 2019-12-09 DIAGNOSIS — M6281 Muscle weakness (generalized): Secondary | ICD-10-CM | POA: Diagnosis not present

## 2019-12-15 DIAGNOSIS — I6389 Other cerebral infarction: Secondary | ICD-10-CM | POA: Diagnosis not present

## 2019-12-15 DIAGNOSIS — F338 Other recurrent depressive disorders: Secondary | ICD-10-CM | POA: Diagnosis not present

## 2019-12-15 DIAGNOSIS — G8929 Other chronic pain: Secondary | ICD-10-CM | POA: Diagnosis not present

## 2020-01-05 DIAGNOSIS — G8929 Other chronic pain: Secondary | ICD-10-CM | POA: Diagnosis not present

## 2020-01-05 DIAGNOSIS — F338 Other recurrent depressive disorders: Secondary | ICD-10-CM | POA: Diagnosis not present

## 2020-01-11 DIAGNOSIS — J40 Bronchitis, not specified as acute or chronic: Secondary | ICD-10-CM | POA: Diagnosis not present

## 2020-01-26 DIAGNOSIS — R279 Unspecified lack of coordination: Secondary | ICD-10-CM | POA: Diagnosis not present

## 2020-01-26 DIAGNOSIS — I69354 Hemiplegia and hemiparesis following cerebral infarction affecting left non-dominant side: Secondary | ICD-10-CM | POA: Diagnosis not present

## 2020-01-26 DIAGNOSIS — R2689 Other abnormalities of gait and mobility: Secondary | ICD-10-CM | POA: Diagnosis not present

## 2020-01-26 DIAGNOSIS — M6281 Muscle weakness (generalized): Secondary | ICD-10-CM | POA: Diagnosis not present

## 2020-02-01 DIAGNOSIS — I639 Cerebral infarction, unspecified: Secondary | ICD-10-CM | POA: Diagnosis not present

## 2020-02-01 DIAGNOSIS — I69354 Hemiplegia and hemiparesis following cerebral infarction affecting left non-dominant side: Secondary | ICD-10-CM | POA: Diagnosis not present

## 2020-02-01 DIAGNOSIS — F339 Major depressive disorder, recurrent, unspecified: Secondary | ICD-10-CM | POA: Diagnosis not present

## 2020-02-01 DIAGNOSIS — E222 Syndrome of inappropriate secretion of antidiuretic hormone: Secondary | ICD-10-CM | POA: Diagnosis not present

## 2020-02-04 DIAGNOSIS — R5381 Other malaise: Secondary | ICD-10-CM | POA: Diagnosis not present

## 2020-02-04 DIAGNOSIS — F338 Other recurrent depressive disorders: Secondary | ICD-10-CM | POA: Diagnosis not present

## 2020-02-04 DIAGNOSIS — R600 Localized edema: Secondary | ICD-10-CM | POA: Diagnosis not present

## 2020-02-04 DIAGNOSIS — G8929 Other chronic pain: Secondary | ICD-10-CM | POA: Diagnosis not present

## 2020-02-07 DIAGNOSIS — R601 Generalized edema: Secondary | ICD-10-CM | POA: Diagnosis not present

## 2020-02-09 DIAGNOSIS — R279 Unspecified lack of coordination: Secondary | ICD-10-CM | POA: Diagnosis not present

## 2020-02-09 DIAGNOSIS — R300 Dysuria: Secondary | ICD-10-CM | POA: Diagnosis not present

## 2020-02-09 DIAGNOSIS — R2689 Other abnormalities of gait and mobility: Secondary | ICD-10-CM | POA: Diagnosis not present

## 2020-02-09 DIAGNOSIS — R5381 Other malaise: Secondary | ICD-10-CM | POA: Diagnosis not present

## 2020-02-09 DIAGNOSIS — I7389 Other specified peripheral vascular diseases: Secondary | ICD-10-CM | POA: Diagnosis not present

## 2020-02-09 DIAGNOSIS — I69354 Hemiplegia and hemiparesis following cerebral infarction affecting left non-dominant side: Secondary | ICD-10-CM | POA: Diagnosis not present

## 2020-02-09 DIAGNOSIS — M6281 Muscle weakness (generalized): Secondary | ICD-10-CM | POA: Diagnosis not present

## 2020-03-03 ENCOUNTER — Other Ambulatory Visit (INDEPENDENT_AMBULATORY_CARE_PROVIDER_SITE_OTHER): Payer: Self-pay | Admitting: Vascular Surgery

## 2020-03-03 DIAGNOSIS — I739 Peripheral vascular disease, unspecified: Secondary | ICD-10-CM

## 2020-03-06 ENCOUNTER — Ambulatory Visit (INDEPENDENT_AMBULATORY_CARE_PROVIDER_SITE_OTHER): Payer: PPO | Admitting: Vascular Surgery

## 2020-03-06 ENCOUNTER — Ambulatory Visit (INDEPENDENT_AMBULATORY_CARE_PROVIDER_SITE_OTHER): Payer: PPO

## 2020-03-06 ENCOUNTER — Other Ambulatory Visit: Payer: Self-pay

## 2020-03-06 VITALS — BP 106/70 | HR 78 | Ht 61.0 in | Wt 184.0 lb

## 2020-03-06 DIAGNOSIS — M159 Polyosteoarthritis, unspecified: Secondary | ICD-10-CM

## 2020-03-06 DIAGNOSIS — M79669 Pain in unspecified lower leg: Secondary | ICD-10-CM

## 2020-03-06 DIAGNOSIS — M8949 Other hypertrophic osteoarthropathy, multiple sites: Secondary | ICD-10-CM

## 2020-03-06 DIAGNOSIS — I10 Essential (primary) hypertension: Secondary | ICD-10-CM

## 2020-03-06 DIAGNOSIS — I739 Peripheral vascular disease, unspecified: Secondary | ICD-10-CM

## 2020-03-06 DIAGNOSIS — I890 Lymphedema, not elsewhere classified: Secondary | ICD-10-CM | POA: Diagnosis not present

## 2020-03-06 DIAGNOSIS — M7989 Other specified soft tissue disorders: Secondary | ICD-10-CM

## 2020-03-06 DIAGNOSIS — E7800 Pure hypercholesterolemia, unspecified: Secondary | ICD-10-CM | POA: Diagnosis not present

## 2020-03-08 DIAGNOSIS — M6281 Muscle weakness (generalized): Secondary | ICD-10-CM | POA: Diagnosis not present

## 2020-03-08 DIAGNOSIS — E039 Hypothyroidism, unspecified: Secondary | ICD-10-CM | POA: Diagnosis not present

## 2020-03-08 DIAGNOSIS — D649 Anemia, unspecified: Secondary | ICD-10-CM | POA: Diagnosis not present

## 2020-03-08 DIAGNOSIS — R945 Abnormal results of liver function studies: Secondary | ICD-10-CM | POA: Diagnosis not present

## 2020-03-08 DIAGNOSIS — R2689 Other abnormalities of gait and mobility: Secondary | ICD-10-CM | POA: Diagnosis not present

## 2020-03-08 DIAGNOSIS — R279 Unspecified lack of coordination: Secondary | ICD-10-CM | POA: Diagnosis not present

## 2020-03-08 DIAGNOSIS — R262 Difficulty in walking, not elsewhere classified: Secondary | ICD-10-CM | POA: Diagnosis not present

## 2020-03-08 DIAGNOSIS — I69354 Hemiplegia and hemiparesis following cerebral infarction affecting left non-dominant side: Secondary | ICD-10-CM | POA: Diagnosis not present

## 2020-03-12 ENCOUNTER — Encounter (INDEPENDENT_AMBULATORY_CARE_PROVIDER_SITE_OTHER): Payer: Self-pay | Admitting: Vascular Surgery

## 2020-03-12 DIAGNOSIS — M79669 Pain in unspecified lower leg: Secondary | ICD-10-CM | POA: Insufficient documentation

## 2020-03-12 DIAGNOSIS — I890 Lymphedema, not elsewhere classified: Secondary | ICD-10-CM | POA: Insufficient documentation

## 2020-03-12 DIAGNOSIS — M1990 Unspecified osteoarthritis, unspecified site: Secondary | ICD-10-CM | POA: Insufficient documentation

## 2020-03-12 NOTE — Progress Notes (Signed)
MRN : [id number]  [name] [name] is a 80 y.o. ([birth year]) female who presents with chief complaint of  Chief Complaint  Patient presents with  . New Patient (Initial Visit)    [facility]  .  History of Present Illness:   Patient is seen for evaluation of leg pain and leg swelling. The patient first noticed the swelling remotely. The swelling is associated with pain and discoloration. The pain and swelling worsens with prolonged dependency and improves with elevation. The pain is unrelated to activity.  The patient notes that in the morning the legs are significantly improved but they steadily worsened throughout the course of the day. The patient also notes a steady worsening of the discoloration in the ankle and shin area.   The patient denies claudication symptoms.  The patient denies symptoms consistent with rest pain.  The patient denies and extensive history of DJD and LS spine disease.  The patient has no had any past angiography, interventions or vascular surgery.  Elevation makes the leg symptoms better, dependency makes them much worse. There is no history of ulcerations. The patient denies any recent changes in medications.  The patient has not been wearing graduated compression.  The patient denies a history of DVT or PE. There is no prior history of phlebitis. There is no history of primary lymphedema.  No history of malignancies. No history of trauma or groin or pelvic surgery. There is no history of radiation treatment to the groin or pelvis  The patient denies amaurosis fugax or recent TIA symptoms. There are no recent neurological changes noted. The patient denies recent episodes of angina or shortness of breath.  ABI's are normal  Current Meds  Medication Sig  . acetaminophen (TYLENOL) 325 MG tablet Take 650 mg by mouth every 6 (six) hours as needed. for pain/ increased temp. May be administered orally, per G-tube if needed or rectally if unable to swallow  (separate order). Maximum dose for 24 hours is 3,000 mg from all sources of Acetaminophen/ Tylenol  . alum & mag hydroxide-simeth (MAALOX PLUS) 400-400-40 MG/5ML suspension Take 30 mLs by mouth every 4 (four) hours as needed for indigestion.   . Artificial Saliva (BIOTENE MOISTURIZING MOUTH MT) Use as directed 2 sprays in the mouth or throat 4 (four) times daily as needed.  . Calcium Carbonate-Vitamin D 600-400 MG-UNIT tablet Take 1 tablet by mouth 2 (two) times daily.  . Carboxymeth-Glycerin-Polysorb 0.5-1-0.5 % SOLN Place 2 drops into both eyes 2 (two) times daily.  . carvedilol (COREG) 3.125 MG tablet Take 3.125 mg by mouth 2 (two) times daily.   . cetirizine (ZYRTEC) 5 MG tablet Take 5 mg by mouth at bedtime.  . cyanocobalamin 1000 MCG tablet Take 1,000 mcg by mouth daily.  Marland Kitchen demeclocycline (DECLOMYCIN) 300 MG tablet Take 300 mg by mouth 2 (two) times daily.   Marland Kitchen dextromethorphan (DELSYM) 30 MG/5ML liquid Take 10 mLs by mouth every 12 (twelve) hours as needed for cough.  . diclofenac Sodium (VOLTAREN) 1 % GEL Apply 1 application topically 4 (four) times daily.  Marland Kitchen dipyridamole-aspirin (AGGRENOX) 200-25 MG 12hr capsule Take 1 capsule by mouth every 12 (twelve) hours.   . divalproex (DEPAKOTE SPRINKLE) 125 MG capsule Take 125 mg by mouth 2 (two) times daily.   . divalproex (DEPAKOTE) 125 MG DR tablet Take 125 mg by mouth 2 (two) times daily.  . Docosanol 10 % CREA Apply topically to cold sore on upper lip two times daily  .  fluticasone furoate-vilanterol (BREO ELLIPTA) 100-25 MCG/INH AEPB Inhale 1 puff into the lungs daily.   Marland Kitchen gabapentin (NEURONTIN) 600 MG tablet Take 600 mg by mouth 3 (three) times daily.   Marland Kitchen guaiFENesin (MUCINEX) 600 MG 12 hr tablet Take 1,200 mg by mouth every 12 (twelve) hours.   Marland Kitchen ipratropium-albuterol (DUONEB) 0.5-2.5 (3) MG/3ML SOLN Take 3 mLs by nebulization every 6 (six) hours as needed.   Marland Kitchen levothyroxine (SYNTHROID, LEVOTHROID) 75 MCG tablet Take 75 mcg by mouth daily  before breakfast.   . losartan (COZAAR) 50 MG tablet Take 1 tablet by mouth 2 (two) times daily.  . magnesium hydroxide (MILK OF MAGNESIA) 400 MG/5ML suspension Take 30 mLs by mouth every 4 (four) hours as needed for mild constipation.  . Menthol (HALLS COUGH DROPS MT) Take 3.2 mg by mouth as needed  . Menthol, Topical Analgesic, 4 % GEL Apply 1 application topically 2 (two) times daily. Apply to bilateral lower extremities  . montelukast (SINGULAIR) 10 MG tablet Take 10 mg by mouth at bedtime.   Marland Kitchen omeprazole (PRILOSEC) 20 MG capsule Take 20 mg by mouth 2 (two) times daily.   Marland Kitchen oxybutynin (DITROPAN) 5 MG tablet Take 5 mg by mouth 2 (two) times daily.   . OXYGEN O2 as needed.  Titrate to keep sat > / = 92  . polyethylene glycol (MIRALAX / GLYCOLAX) packet Take 17 g by mouth daily. dx: constipation  Hold if loose stool  . prazosin (MINIPRESS) 1 MG capsule Take 3 mg by mouth at bedtime. 3 capsules  . predniSONE (DELTASONE) 20 MG tablet Take 20 mg by mouth daily.  . Probiotic Product (ACIDOPHILUS) CHEW Chew by mouth at bedtime.  . Salicylic Acid 40 % PADS Apply 1 corn pad to Right 5th toe daily until corn resolved  . sennosides-docusate sodium (SENOKOT-S) 8.6-50 MG tablet Take 2 tablets by mouth 2 (two) times daily as needed for constipation. Also take 1 tab by mouth at bedtime daily.  . Skin Protectants, Misc. (ENDIT EX) Apply liberal amount topically to areas of skin irritation as needed. Ok to leave at bedside.  . sodium chloride 1 g tablet Take 1 g by mouth daily.   Marland Kitchen spironolactone (ALDACTONE) 50 MG tablet Take 50 mg by mouth daily.  . THERATEARS 0.25 % SOLN Apply to eye.  . torsemide (DEMADEX) 10 MG tablet Take 10 mg by mouth daily.   Marland Kitchen triamcinolone (NASACORT) 55 MCG/ACT AERO nasal inhaler Place 1 spray into the nose daily.  Marland Kitchen UNABLE TO FIND Diet Type: Regular. No Sodium Restriction. Gatorade on lunch and supper trays for hyponatremia  . venlafaxine XR (EFFEXOR-XR) 75 MG 24 hr capsule Take  75 mg by mouth daily with breakfast.    Past Medical History:  Diagnosis Date  . Arthritis   . Asthma   . 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 Depression with anxiety   . GERD (gastroesophageal reflux disease)   . Hemiparesis affecting left side as late effect of cerebrovascular accident (CVA) ([place])   . Hot flashes   . Hyperlipidemia   . Hypertension   . Muscle hypertonicity   . Osteoarthritis   . Stroke ([place])   . TIA (transient ischemic attack)     Past Surgical History:  Procedure Laterality Date  . ACHILLES TENDON LENGTHENING  08/25/2015   Procedure: ACHILLES TENDON LENGTHENING;  Surgeon: Samara Deist, DPM;  Location: [facility] ORS;  Service: Podiatry;;  . APPENDECTOMY    . COLONOSCOPY  06/17/2008  .  EYE SURGERY Bilateral    Cataract Extraction with IOL  . FLAT FOOT RECONSTRUCTION-TAL GASTROC RECESSION Left 08/25/2015   Procedure: ENDOSCOPIC FLAT FOOT RECONSTRUCTION-TAL GASTROC RECESSION;  Surgeon: Samara Deist, DPM;  Location: [facility] OR[facility];  Service: Podiatry;  Laterality: Left;  Marland Kitchen VAGINAL HYSTERECTOMY      Social History Social History   Tobacco Use  . Smoking status: Former Smoker    Packs/day: 0.50    Years: 3.00    Pack years: 1.50    Types: Cigarettes    Quit date: 01/08/1963    Years since quitting: 57.2  . Smokeless tobacco: Never Used  Vaping Use  . Vaping Use: Never used  Substance Use Topics  . Alcohol use: No    Alcohol/week: 0.0 standard drinks    Comment: does not drink alcohol  . Drug use: No    Family History Family History  Problem Relation Age of Onset  . Alcohol abuse Mother   . Coronary artery disease Mother   . Stroke Mother   . Liver cancer Father   . Alcohol abuse Brother   . Coronary artery disease Brother   . Diabetes type II Brother   . Liver cancer Brother   No family history of bleeding/clotting disorders, porphyria or autoimmune disease   Allergies  Allergen Reactions  . Tizanidine Other (See Comments)     HYPOTENSION AND UNRESPONSIVENESS  . Ace Inhibitors Cough  . Latex Hives  . Lisinopril Cough  . Tetracycline Other (See Comments)     REACTION: yeast  . Zanaflex [Tizanidine Hcl] Other (See Comments)    "drowsy"     REVIEW OF SYSTEMS (Negative unless checked)  Constitutional: [] Weight loss  [] Fever  [] Chills Cardiac: [] Chest pain   [] Chest pressure   [] Palpitations   [] Shortness of breath when laying flat   [] Shortness of breath with exertion. Vascular:  [] Pain in legs with walking   [x] Pain in legs at rest  [] History of DVT   [] Phlebitis   [x] Swelling in legs   [] Varicose veins   [] Non-healing ulcers Pulmonary:   [] Uses home oxygen   [] Productive cough   [] Hemoptysis   [] Wheeze  [] COPD   [] Asthma Neurologic:  [] Dizziness   [] Seizures   [] History of stroke   [] History of TIA  [] Aphasia   [] Vissual changes   [] Weakness or numbness in arm   [x] Weakness or numbness in leg Musculoskeletal:   [] Joint swelling   [x] Joint pain   [] Low back pain Hematologic:  [] Easy bruising  [] Easy bleeding   [] Hypercoagulable state   [] Anemic Gastrointestinal:  [] Diarrhea   [] Vomiting  [] Gastroesophageal reflux/heartburn   [] Difficulty swallowing. Genitourinary:  [] Chronic kidney disease   [] Difficult urination  [] Frequent urination   [] Blood in urine Skin:  [] Rashes   [] Ulcers  Psychological:  [] History of anxiety   []  History of major depression.  Physical Examination  Vitals:   03/06/20 1603  BP: 106/70  Pulse: 78  Weight: 184 lb (83.5 kg)  Height: 5\' 1"  (1.549 m)   Body mass index is 34.77 kg/m. Gen: WD/WN, NAD seen in a wheelchair Head: [place]/AT, No temporalis wasting.  Ear/Nose/Throat: Hearing grossly intact, nares w/o erythema or drainage, poor dentition Eyes: PER, EOMI, sclera nonicteric.  Neck: Supple, no masses.  No bruit or JVD.  Pulmonary:  Good air movement, clear to auscultation bilaterally, no use of accessory muscles.  Cardiac: RRR, normal S1, S2, no Murmurs. Vascular: scattered  varicosities present bilaterally.  Moderate venous stasis changes to the legs bilaterally.  3+ soft  pitting edema Vessel Right Left  Radial Palpable Palpable  PT Not Palpable Not Palpable  DP Palpable Palpable  Gastrointestinal: soft, non-distended. No guarding/no peritoneal signs.  Musculoskeletal: M/S 5/5 throughout.  No deformity or atrophy.  Neurologic: CN 2-12 intact. Pain and light touch intact in extremities.  Symmetrical.  Speech is fluent. Motor exam as listed above. Psychiatric: Judgment intact, Mood & affect appropriate for pt's clinical situation. Dermatologic: Moderate venous rashes no ulcers noted.  No changes consistent with cellulitis. Lymph : + lichenification or skin changes of chronic lymphedema.  CBC Lab Results  Component Value Date   WBC 5.8 01/08/2018   HGB 10.9 (L) 01/08/2018   HCT 34.9 (L) 01/08/2018   MCV 95.6 01/08/2018   PLT 203 01/08/2018    BMET    Component Value Date/Time   NA 135 02/17/2018 0601   NA 137 04/06/2013 0652   K 4.0 02/17/2018 0601   K 4.6 04/06/2013 0652   CL 101 02/17/2018 0601   CL 103 04/06/2013 0652   CO2 30 02/17/2018 0601   CO2 32 04/06/2013 0652   GLUCOSE 83 02/17/2018 0601   GLUCOSE 74 04/06/2013 0652   BUN 14 02/17/2018 0601   BUN 11 04/06/2013 0652   CREATININE 0.81 02/17/2018 0601   CREATININE 0.88 04/06/2013 0652   CALCIUM 9.3 02/17/2018 0601   CALCIUM 9.4 04/06/2013 0652   GFRNONAA >60 02/17/2018 0601   GFRNONAA >60 04/06/2013 0652   GFRAA >60 02/17/2018 0601   GFRAA >60 04/06/2013 0652   CrCl cannot be calculated (Patient's most recent lab result is older than the maximum 21 days allowed.).  COAG Lab Results  Component Value Date   INR 0.9 03/25/2013   INR 1.01 05/02/2009   INR 0.93 12/28/2008    Radiology VAS [place] ABI WITH/WO TBI  Result Date: 03/06/2020 LOWER EXTREMITY DOPPLER STUDY Indications: Rest pain, and peripheral artery disease.  Performing Technologist: Charlane Ferretti RT (R)(VS)   Examination Guidelines: A complete evaluation includes at minimum, Doppler waveform signals and systolic blood pressure reading at the level of bilateral brachial, anterior tibial, and posterior tibial arteries, when vessel segments are accessible. Bilateral testing is considered an integral part of a complete examination. Photoelectric Plethysmograph (PPG) waveforms and toe systolic pressure readings are included as required and additional duplex testing as needed. Limited examinations for reoccurring indications may be performed as noted.  ABI Findings: +---------+------------------+-----+---------+--------+ Right    Rt Pressure (mmHg)IndexWaveform Comment  +---------+------------------+-----+---------+--------+ Brachial 108                                      +---------+------------------+-----+---------+--------+ ATA      138               1.28 triphasic         +---------+------------------+-----+---------+--------+ PTA      157               1.45 triphasic         +---------+------------------+-----+---------+--------+ Great Toe130               1.20 Normal            +---------+------------------+-----+---------+--------+ +---------+------------------+-----+---------+------------------------+ Left     Lt Pressure (mmHg)IndexWaveform Comment                  +---------+------------------+-----+---------+------------------------+ Brachial  unable to use per pt.138 +---------+------------------+-----+---------+------------------------+ ATA      144               1.33 triphasic                         +---------+------------------+-----+---------+------------------------+ PTA      140               1.30 triphasic                         +---------+------------------+-----+---------+------------------------+ Great Toe185               1.71 Normal                             +---------+------------------+-----+---------+------------------------+ Summary: Right: Resting right ankle-brachial index indicates noncompressible right lower extremity arteries. The right toe-brachial index is normal. Left: Resting left ankle-brachial index indicates noncompressible left lower extremity arteries. The left toe-brachial index is normal. *See table(s) above for measurements and observations.  Electronically signed by Hortencia Pilar MD on 03/06/2020 at 5:19:34 PM.   Final      Assessment/Plan 1. Pain and swelling of lower leg, unspecified laterality I have had a long discussion with the patient regarding swelling and why it  causes symptoms.  Patient will begin wearing graduated compression stockings class 1 (20-30 mmHg) on a daily basis a prescription was given. The patient will  beginning wearing the stockings first thing in the morning and removing them in the evening. The patient is instructed specifically not to sleep in the stockings.   In addition, behavioral modification will be initiated.  This will include frequent elevation, use of over the counter pain medications and exercise such as walking.  I have reviewed systemic causes for chronic edema such as liver, kidney and cardiac etiologies.  The patient denies problems with these organ systems.    Consideration for a lymph pump will also be made based upon the effectiveness of conservative therapy.  This would help to improve the edema control and prevent sequela such as ulcers and infections   Patient should undergo duplex ultrasound of the venous system to ensure that DVT or reflux is not present.  The patient will follow-up with me after the ultrasound.   - VAS [place] LOWER EXTREMITY VENOUS REFLUX; Future  2. Lymphedema I have had a long discussion with the patient regarding swelling and why it  causes symptoms.  Patient will begin wearing graduated compression stockings class 1 (20-30 mmHg) on a daily basis a  prescription was given. The patient will  beginning wearing the stockings first thing in the morning and removing them in the evening. The patient is instructed specifically not to sleep in the stockings.   In addition, behavioral modification will be initiated.  This will include frequent elevation, use of over the counter pain medications and exercise such as walking.  I have reviewed systemic causes for chronic edema such as liver, kidney and cardiac etiologies.  The patient denies problems with these organ systems.    Consideration for a lymph pump will also be made based upon the effectiveness of conservative therapy.  This would help to improve the edema control and prevent sequela such as ulcers and infections   Patient should undergo duplex ultrasound of the venous system to ensure that DVT or reflux is not present.  The patient will follow-up  with me after the ultrasound.   - VAS [place] LOWER EXTREMITY VENOUS REFLUX; Future  3. Essential hypertension, benign Continue antihypertensive medications as already ordered, these medications have been reviewed and there are no changes at this time.   4. Pure hypercholesterolemia Continue statin as ordered and reviewed, no changes at this time   5. Primary osteoarthritis involving multiple joints Continue NSAID medications as already ordered, these medications have been reviewed and there are no changes at this time.  Continued activity and therapy was stressed.      Hortencia Pilar, MD  03/12/2020 5:03 PM

## 2020-03-15 DIAGNOSIS — G8929 Other chronic pain: Secondary | ICD-10-CM | POA: Diagnosis not present

## 2020-03-15 DIAGNOSIS — S70311A Abrasion, right thigh, initial encounter: Secondary | ICD-10-CM | POA: Diagnosis not present

## 2020-03-20 DIAGNOSIS — R0602 Shortness of breath: Secondary | ICD-10-CM | POA: Diagnosis not present

## 2020-03-22 DIAGNOSIS — G8929 Other chronic pain: Secondary | ICD-10-CM | POA: Diagnosis not present

## 2020-03-22 DIAGNOSIS — I10 Essential (primary) hypertension: Secondary | ICD-10-CM | POA: Diagnosis not present

## 2020-03-22 DIAGNOSIS — F339 Major depressive disorder, recurrent, unspecified: Secondary | ICD-10-CM | POA: Diagnosis not present

## 2020-03-22 DIAGNOSIS — I69354 Hemiplegia and hemiparesis following cerebral infarction affecting left non-dominant side: Secondary | ICD-10-CM | POA: Diagnosis not present

## 2020-03-24 DIAGNOSIS — I5189 Other ill-defined heart diseases: Secondary | ICD-10-CM | POA: Diagnosis not present

## 2020-04-04 DIAGNOSIS — J42 Unspecified chronic bronchitis: Secondary | ICD-10-CM | POA: Diagnosis not present

## 2020-04-28 DIAGNOSIS — F338 Other recurrent depressive disorders: Secondary | ICD-10-CM | POA: Diagnosis not present

## 2020-04-28 DIAGNOSIS — E222 Syndrome of inappropriate secretion of antidiuretic hormone: Secondary | ICD-10-CM | POA: Diagnosis not present

## 2020-05-16 DIAGNOSIS — I10 Essential (primary) hypertension: Secondary | ICD-10-CM | POA: Diagnosis not present

## 2020-05-30 DIAGNOSIS — J209 Acute bronchitis, unspecified: Secondary | ICD-10-CM | POA: Diagnosis not present

## 2020-06-01 DIAGNOSIS — R2689 Other abnormalities of gait and mobility: Secondary | ICD-10-CM | POA: Diagnosis not present

## 2020-06-01 DIAGNOSIS — I69354 Hemiplegia and hemiparesis following cerebral infarction affecting left non-dominant side: Secondary | ICD-10-CM | POA: Diagnosis not present

## 2020-06-01 DIAGNOSIS — R531 Weakness: Secondary | ICD-10-CM | POA: Diagnosis not present

## 2020-06-01 DIAGNOSIS — R279 Unspecified lack of coordination: Secondary | ICD-10-CM | POA: Diagnosis not present

## 2020-06-01 DIAGNOSIS — R262 Difficulty in walking, not elsewhere classified: Secondary | ICD-10-CM | POA: Diagnosis not present

## 2020-06-01 DIAGNOSIS — M6281 Muscle weakness (generalized): Secondary | ICD-10-CM | POA: Diagnosis not present

## 2020-06-06 DIAGNOSIS — J208 Acute bronchitis due to other specified organisms: Secondary | ICD-10-CM | POA: Diagnosis not present

## 2020-06-07 DIAGNOSIS — R531 Weakness: Secondary | ICD-10-CM | POA: Diagnosis not present

## 2020-06-07 DIAGNOSIS — R262 Difficulty in walking, not elsewhere classified: Secondary | ICD-10-CM | POA: Diagnosis not present

## 2020-06-07 DIAGNOSIS — R279 Unspecified lack of coordination: Secondary | ICD-10-CM | POA: Diagnosis not present

## 2020-06-07 DIAGNOSIS — R2689 Other abnormalities of gait and mobility: Secondary | ICD-10-CM | POA: Diagnosis not present

## 2020-06-07 DIAGNOSIS — M6281 Muscle weakness (generalized): Secondary | ICD-10-CM | POA: Diagnosis not present

## 2020-06-07 DIAGNOSIS — I69354 Hemiplegia and hemiparesis following cerebral infarction affecting left non-dominant side: Secondary | ICD-10-CM | POA: Diagnosis not present

## 2020-06-13 DIAGNOSIS — J40 Bronchitis, not specified as acute or chronic: Secondary | ICD-10-CM | POA: Diagnosis not present

## 2020-07-03 ENCOUNTER — Ambulatory Visit (INDEPENDENT_AMBULATORY_CARE_PROVIDER_SITE_OTHER): Payer: PPO | Admitting: Nurse Practitioner

## 2020-07-03 ENCOUNTER — Encounter (INDEPENDENT_AMBULATORY_CARE_PROVIDER_SITE_OTHER): Payer: PPO

## 2020-07-03 ENCOUNTER — Other Ambulatory Visit (INDEPENDENT_AMBULATORY_CARE_PROVIDER_SITE_OTHER): Payer: Self-pay | Admitting: Vascular Surgery

## 2020-07-03 DIAGNOSIS — I83893 Varicose veins of bilateral lower extremities with other complications: Secondary | ICD-10-CM

## 2020-07-04 ENCOUNTER — Encounter (INDEPENDENT_AMBULATORY_CARE_PROVIDER_SITE_OTHER): Payer: Self-pay | Admitting: Nurse Practitioner

## 2020-07-05 DIAGNOSIS — K219 Gastro-esophageal reflux disease without esophagitis: Secondary | ICD-10-CM | POA: Diagnosis not present

## 2020-07-05 DIAGNOSIS — G8929 Other chronic pain: Secondary | ICD-10-CM | POA: Diagnosis not present

## 2020-07-05 DIAGNOSIS — F339 Major depressive disorder, recurrent, unspecified: Secondary | ICD-10-CM | POA: Diagnosis not present

## 2020-07-18 DIAGNOSIS — F419 Anxiety disorder, unspecified: Secondary | ICD-10-CM | POA: Diagnosis not present

## 2020-07-18 DIAGNOSIS — I639 Cerebral infarction, unspecified: Secondary | ICD-10-CM | POA: Diagnosis not present

## 2020-07-18 DIAGNOSIS — R531 Weakness: Secondary | ICD-10-CM | POA: Diagnosis not present

## 2020-07-18 DIAGNOSIS — M6259 Muscle wasting and atrophy, not elsewhere classified, multiple sites: Secondary | ICD-10-CM | POA: Diagnosis not present

## 2020-07-18 DIAGNOSIS — R262 Difficulty in walking, not elsewhere classified: Secondary | ICD-10-CM | POA: Diagnosis not present

## 2020-07-18 DIAGNOSIS — R2689 Other abnormalities of gait and mobility: Secondary | ICD-10-CM | POA: Diagnosis not present

## 2020-07-18 DIAGNOSIS — M24542 Contracture, left hand: Secondary | ICD-10-CM | POA: Diagnosis not present

## 2020-07-18 DIAGNOSIS — I69354 Hemiplegia and hemiparesis following cerebral infarction affecting left non-dominant side: Secondary | ICD-10-CM | POA: Diagnosis not present

## 2020-07-18 DIAGNOSIS — R279 Unspecified lack of coordination: Secondary | ICD-10-CM | POA: Diagnosis not present

## 2020-07-18 DIAGNOSIS — M6281 Muscle weakness (generalized): Secondary | ICD-10-CM | POA: Diagnosis not present

## 2020-08-04 DIAGNOSIS — I69354 Hemiplegia and hemiparesis following cerebral infarction affecting left non-dominant side: Secondary | ICD-10-CM | POA: Diagnosis not present

## 2020-08-04 DIAGNOSIS — R5381 Other malaise: Secondary | ICD-10-CM | POA: Diagnosis not present

## 2020-08-04 DIAGNOSIS — E669 Obesity, unspecified: Secondary | ICD-10-CM | POA: Diagnosis not present

## 2020-08-04 DIAGNOSIS — J45909 Unspecified asthma, uncomplicated: Secondary | ICD-10-CM | POA: Diagnosis not present

## 2020-08-08 DIAGNOSIS — R2689 Other abnormalities of gait and mobility: Secondary | ICD-10-CM | POA: Diagnosis not present

## 2020-08-08 DIAGNOSIS — R279 Unspecified lack of coordination: Secondary | ICD-10-CM | POA: Diagnosis not present

## 2020-08-08 DIAGNOSIS — M6259 Muscle wasting and atrophy, not elsewhere classified, multiple sites: Secondary | ICD-10-CM | POA: Diagnosis not present

## 2020-08-08 DIAGNOSIS — M24542 Contracture, left hand: Secondary | ICD-10-CM | POA: Diagnosis not present

## 2020-08-08 DIAGNOSIS — I639 Cerebral infarction, unspecified: Secondary | ICD-10-CM | POA: Diagnosis not present

## 2020-08-08 DIAGNOSIS — I69354 Hemiplegia and hemiparesis following cerebral infarction affecting left non-dominant side: Secondary | ICD-10-CM | POA: Diagnosis not present

## 2020-08-08 DIAGNOSIS — R531 Weakness: Secondary | ICD-10-CM | POA: Diagnosis not present

## 2020-08-08 DIAGNOSIS — F419 Anxiety disorder, unspecified: Secondary | ICD-10-CM | POA: Diagnosis not present

## 2020-08-08 DIAGNOSIS — R262 Difficulty in walking, not elsewhere classified: Secondary | ICD-10-CM | POA: Diagnosis not present

## 2020-08-08 DIAGNOSIS — M6281 Muscle weakness (generalized): Secondary | ICD-10-CM | POA: Diagnosis not present

## 2020-09-13 DIAGNOSIS — D649 Anemia, unspecified: Secondary | ICD-10-CM | POA: Diagnosis not present

## 2020-09-13 DIAGNOSIS — R945 Abnormal results of liver function studies: Secondary | ICD-10-CM | POA: Diagnosis not present

## 2020-09-14 DIAGNOSIS — J309 Allergic rhinitis, unspecified: Secondary | ICD-10-CM | POA: Diagnosis not present

## 2020-09-21 DIAGNOSIS — E222 Syndrome of inappropriate secretion of antidiuretic hormone: Secondary | ICD-10-CM | POA: Diagnosis not present

## 2020-09-21 DIAGNOSIS — I639 Cerebral infarction, unspecified: Secondary | ICD-10-CM | POA: Diagnosis not present

## 2020-09-21 DIAGNOSIS — F339 Major depressive disorder, recurrent, unspecified: Secondary | ICD-10-CM | POA: Diagnosis not present

## 2020-09-21 DIAGNOSIS — I69354 Hemiplegia and hemiparesis following cerebral infarction affecting left non-dominant side: Secondary | ICD-10-CM | POA: Diagnosis not present

## 2020-10-13 DIAGNOSIS — H04123 Dry eye syndrome of bilateral lacrimal glands: Secondary | ICD-10-CM | POA: Diagnosis not present

## 2020-10-13 DIAGNOSIS — H26493 Other secondary cataract, bilateral: Secondary | ICD-10-CM | POA: Diagnosis not present

## 2020-10-13 DIAGNOSIS — Z961 Presence of intraocular lens: Secondary | ICD-10-CM | POA: Diagnosis not present

## 2020-10-23 DIAGNOSIS — R768 Other specified abnormal immunological findings in serum: Secondary | ICD-10-CM | POA: Diagnosis not present

## 2020-10-31 DIAGNOSIS — M6281 Muscle weakness (generalized): Secondary | ICD-10-CM | POA: Diagnosis not present

## 2020-10-31 DIAGNOSIS — I69354 Hemiplegia and hemiparesis following cerebral infarction affecting left non-dominant side: Secondary | ICD-10-CM | POA: Diagnosis not present

## 2020-10-31 DIAGNOSIS — M6259 Muscle wasting and atrophy, not elsewhere classified, multiple sites: Secondary | ICD-10-CM | POA: Diagnosis not present

## 2020-10-31 DIAGNOSIS — R279 Unspecified lack of coordination: Secondary | ICD-10-CM | POA: Diagnosis not present

## 2020-10-31 DIAGNOSIS — R262 Difficulty in walking, not elsewhere classified: Secondary | ICD-10-CM | POA: Diagnosis not present

## 2020-10-31 DIAGNOSIS — R2689 Other abnormalities of gait and mobility: Secondary | ICD-10-CM | POA: Diagnosis not present

## 2020-10-31 DIAGNOSIS — M24542 Contracture, left hand: Secondary | ICD-10-CM | POA: Diagnosis not present

## 2020-11-07 DIAGNOSIS — R279 Unspecified lack of coordination: Secondary | ICD-10-CM | POA: Diagnosis not present

## 2020-11-07 DIAGNOSIS — I69354 Hemiplegia and hemiparesis following cerebral infarction affecting left non-dominant side: Secondary | ICD-10-CM | POA: Diagnosis not present

## 2020-11-07 DIAGNOSIS — M24542 Contracture, left hand: Secondary | ICD-10-CM | POA: Diagnosis not present

## 2020-11-07 DIAGNOSIS — M6259 Muscle wasting and atrophy, not elsewhere classified, multiple sites: Secondary | ICD-10-CM | POA: Diagnosis not present

## 2020-11-07 DIAGNOSIS — R2689 Other abnormalities of gait and mobility: Secondary | ICD-10-CM | POA: Diagnosis not present

## 2020-11-07 DIAGNOSIS — R262 Difficulty in walking, not elsewhere classified: Secondary | ICD-10-CM | POA: Diagnosis not present

## 2020-11-07 DIAGNOSIS — M6281 Muscle weakness (generalized): Secondary | ICD-10-CM | POA: Diagnosis not present

## 2020-11-14 DIAGNOSIS — B351 Tinea unguium: Secondary | ICD-10-CM | POA: Diagnosis not present

## 2020-11-14 DIAGNOSIS — I7389 Other specified peripheral vascular diseases: Secondary | ICD-10-CM | POA: Diagnosis not present

## 2020-12-08 DIAGNOSIS — M6281 Muscle weakness (generalized): Secondary | ICD-10-CM | POA: Diagnosis not present

## 2020-12-08 DIAGNOSIS — F339 Major depressive disorder, recurrent, unspecified: Secondary | ICD-10-CM | POA: Diagnosis not present

## 2020-12-08 DIAGNOSIS — R262 Difficulty in walking, not elsewhere classified: Secondary | ICD-10-CM | POA: Diagnosis not present

## 2020-12-08 DIAGNOSIS — M24542 Contracture, left hand: Secondary | ICD-10-CM | POA: Diagnosis not present

## 2020-12-08 DIAGNOSIS — R279 Unspecified lack of coordination: Secondary | ICD-10-CM | POA: Diagnosis not present

## 2020-12-08 DIAGNOSIS — E039 Hypothyroidism, unspecified: Secondary | ICD-10-CM | POA: Diagnosis not present

## 2020-12-08 DIAGNOSIS — R2689 Other abnormalities of gait and mobility: Secondary | ICD-10-CM | POA: Diagnosis not present

## 2020-12-08 DIAGNOSIS — I69354 Hemiplegia and hemiparesis following cerebral infarction affecting left non-dominant side: Secondary | ICD-10-CM | POA: Diagnosis not present

## 2020-12-08 DIAGNOSIS — G8929 Other chronic pain: Secondary | ICD-10-CM | POA: Diagnosis not present

## 2020-12-08 DIAGNOSIS — M6259 Muscle wasting and atrophy, not elsewhere classified, multiple sites: Secondary | ICD-10-CM | POA: Diagnosis not present

## 2020-12-08 DIAGNOSIS — R5381 Other malaise: Secondary | ICD-10-CM | POA: Diagnosis not present

## 2021-01-31 DIAGNOSIS — I7389 Other specified peripheral vascular diseases: Secondary | ICD-10-CM | POA: Diagnosis not present

## 2021-01-31 DIAGNOSIS — L84 Corns and callosities: Secondary | ICD-10-CM | POA: Diagnosis not present

## 2021-01-31 DIAGNOSIS — B351 Tinea unguium: Secondary | ICD-10-CM | POA: Diagnosis not present

## 2021-02-07 DIAGNOSIS — E559 Vitamin D deficiency, unspecified: Secondary | ICD-10-CM | POA: Diagnosis not present

## 2021-02-23 DIAGNOSIS — L853 Xerosis cutis: Secondary | ICD-10-CM | POA: Diagnosis not present

## 2021-02-23 DIAGNOSIS — G8929 Other chronic pain: Secondary | ICD-10-CM | POA: Diagnosis not present

## 2021-02-23 DIAGNOSIS — K649 Unspecified hemorrhoids: Secondary | ICD-10-CM | POA: Diagnosis not present

## 2021-03-07 DIAGNOSIS — I10 Essential (primary) hypertension: Secondary | ICD-10-CM | POA: Diagnosis not present

## 2021-03-07 DIAGNOSIS — R945 Abnormal results of liver function studies: Secondary | ICD-10-CM | POA: Diagnosis not present

## 2021-03-07 DIAGNOSIS — D649 Anemia, unspecified: Secondary | ICD-10-CM | POA: Diagnosis not present

## 2021-03-07 DIAGNOSIS — E039 Hypothyroidism, unspecified: Secondary | ICD-10-CM | POA: Diagnosis not present

## 2021-03-08 DIAGNOSIS — R262 Difficulty in walking, not elsewhere classified: Secondary | ICD-10-CM | POA: Diagnosis not present

## 2021-03-08 DIAGNOSIS — R2681 Unsteadiness on feet: Secondary | ICD-10-CM | POA: Diagnosis not present

## 2021-03-08 DIAGNOSIS — R2689 Other abnormalities of gait and mobility: Secondary | ICD-10-CM | POA: Diagnosis not present

## 2021-03-08 DIAGNOSIS — G609 Hereditary and idiopathic neuropathy, unspecified: Secondary | ICD-10-CM | POA: Diagnosis not present

## 2021-03-08 DIAGNOSIS — M24542 Contracture, left hand: Secondary | ICD-10-CM | POA: Diagnosis not present

## 2021-03-08 DIAGNOSIS — M6259 Muscle wasting and atrophy, not elsewhere classified, multiple sites: Secondary | ICD-10-CM | POA: Diagnosis not present

## 2021-03-08 DIAGNOSIS — R531 Weakness: Secondary | ICD-10-CM | POA: Diagnosis not present

## 2021-03-08 DIAGNOSIS — M62549 Muscle wasting and atrophy, not elsewhere classified, unspecified hand: Secondary | ICD-10-CM | POA: Diagnosis not present

## 2021-03-08 DIAGNOSIS — R279 Unspecified lack of coordination: Secondary | ICD-10-CM | POA: Diagnosis not present

## 2021-03-08 DIAGNOSIS — M6281 Muscle weakness (generalized): Secondary | ICD-10-CM | POA: Diagnosis not present

## 2021-04-04 DIAGNOSIS — K5900 Constipation, unspecified: Secondary | ICD-10-CM | POA: Diagnosis not present

## 2021-04-04 DIAGNOSIS — I10 Essential (primary) hypertension: Secondary | ICD-10-CM | POA: Diagnosis not present

## 2021-04-04 DIAGNOSIS — K219 Gastro-esophageal reflux disease without esophagitis: Secondary | ICD-10-CM | POA: Diagnosis not present

## 2021-04-04 DIAGNOSIS — J45909 Unspecified asthma, uncomplicated: Secondary | ICD-10-CM | POA: Diagnosis not present

## 2021-04-07 DIAGNOSIS — K219 Gastro-esophageal reflux disease without esophagitis: Secondary | ICD-10-CM | POA: Diagnosis not present

## 2021-04-07 DIAGNOSIS — R531 Weakness: Secondary | ICD-10-CM | POA: Diagnosis not present

## 2021-04-07 DIAGNOSIS — E668 Other obesity: Secondary | ICD-10-CM | POA: Diagnosis not present

## 2021-04-07 DIAGNOSIS — I10 Essential (primary) hypertension: Secondary | ICD-10-CM | POA: Diagnosis not present

## 2021-04-07 DIAGNOSIS — F4310 Post-traumatic stress disorder, unspecified: Secondary | ICD-10-CM | POA: Diagnosis not present

## 2021-04-07 DIAGNOSIS — M6281 Muscle weakness (generalized): Secondary | ICD-10-CM | POA: Diagnosis not present

## 2021-04-07 DIAGNOSIS — I5021 Acute systolic (congestive) heart failure: Secondary | ICD-10-CM | POA: Diagnosis not present

## 2021-04-07 DIAGNOSIS — R2681 Unsteadiness on feet: Secondary | ICD-10-CM | POA: Diagnosis not present

## 2021-04-07 DIAGNOSIS — I739 Peripheral vascular disease, unspecified: Secondary | ICD-10-CM | POA: Diagnosis not present

## 2021-04-07 DIAGNOSIS — G609 Hereditary and idiopathic neuropathy, unspecified: Secondary | ICD-10-CM | POA: Diagnosis not present

## 2021-04-07 DIAGNOSIS — E569 Vitamin deficiency, unspecified: Secondary | ICD-10-CM | POA: Diagnosis not present

## 2021-04-07 DIAGNOSIS — M24542 Contracture, left hand: Secondary | ICD-10-CM | POA: Diagnosis not present

## 2021-04-07 DIAGNOSIS — E785 Hyperlipidemia, unspecified: Secondary | ICD-10-CM | POA: Diagnosis not present

## 2021-04-07 DIAGNOSIS — M6259 Muscle wasting and atrophy, not elsewhere classified, multiple sites: Secondary | ICD-10-CM | POA: Diagnosis not present

## 2021-04-07 DIAGNOSIS — R609 Edema, unspecified: Secondary | ICD-10-CM | POA: Diagnosis not present

## 2021-04-07 DIAGNOSIS — M25579 Pain in unspecified ankle and joints of unspecified foot: Secondary | ICD-10-CM | POA: Diagnosis not present

## 2021-04-07 DIAGNOSIS — F419 Anxiety disorder, unspecified: Secondary | ICD-10-CM | POA: Diagnosis not present

## 2021-04-07 DIAGNOSIS — E039 Hypothyroidism, unspecified: Secondary | ICD-10-CM | POA: Diagnosis not present

## 2021-04-07 DIAGNOSIS — I69354 Hemiplegia and hemiparesis following cerebral infarction affecting left non-dominant side: Secondary | ICD-10-CM | POA: Diagnosis not present

## 2021-04-07 DIAGNOSIS — G8929 Other chronic pain: Secondary | ICD-10-CM | POA: Diagnosis not present

## 2021-04-07 DIAGNOSIS — F325 Major depressive disorder, single episode, in full remission: Secondary | ICD-10-CM | POA: Diagnosis not present

## 2021-05-04 DIAGNOSIS — G8929 Other chronic pain: Secondary | ICD-10-CM | POA: Diagnosis not present

## 2021-05-04 DIAGNOSIS — F339 Major depressive disorder, recurrent, unspecified: Secondary | ICD-10-CM | POA: Diagnosis not present

## 2021-05-04 DIAGNOSIS — G629 Polyneuropathy, unspecified: Secondary | ICD-10-CM | POA: Diagnosis not present

## 2021-05-17 DIAGNOSIS — J441 Chronic obstructive pulmonary disease with (acute) exacerbation: Secondary | ICD-10-CM | POA: Diagnosis not present

## 2021-05-17 DIAGNOSIS — I69354 Hemiplegia and hemiparesis following cerebral infarction affecting left non-dominant side: Secondary | ICD-10-CM | POA: Diagnosis not present

## 2021-05-17 DIAGNOSIS — I509 Heart failure, unspecified: Secondary | ICD-10-CM | POA: Diagnosis not present

## 2021-05-17 DIAGNOSIS — I739 Peripheral vascular disease, unspecified: Secondary | ICD-10-CM | POA: Diagnosis not present

## 2021-06-08 DIAGNOSIS — K5900 Constipation, unspecified: Secondary | ICD-10-CM | POA: Diagnosis not present

## 2021-06-08 DIAGNOSIS — K649 Unspecified hemorrhoids: Secondary | ICD-10-CM | POA: Diagnosis not present

## 2021-06-13 DIAGNOSIS — R945 Abnormal results of liver function studies: Secondary | ICD-10-CM | POA: Diagnosis not present

## 2021-06-21 DIAGNOSIS — M6259 Muscle wasting and atrophy, not elsewhere classified, multiple sites: Secondary | ICD-10-CM | POA: Diagnosis not present

## 2021-07-06 DIAGNOSIS — R6889 Other general symptoms and signs: Secondary | ICD-10-CM | POA: Diagnosis not present

## 2021-07-06 DIAGNOSIS — E222 Syndrome of inappropriate secretion of antidiuretic hormone: Secondary | ICD-10-CM | POA: Diagnosis not present

## 2021-07-09 DIAGNOSIS — M24542 Contracture, left hand: Secondary | ICD-10-CM | POA: Diagnosis not present

## 2021-07-14 ENCOUNTER — Emergency Department: Payer: PPO

## 2021-07-14 ENCOUNTER — Observation Stay
Admission: EM | Admit: 2021-07-14 | Discharge: 2021-07-15 | Disposition: A | Discharge status: Home / Self Care | Payer: PPO | Attending: Hospitalist | Admitting: Hospitalist

## 2021-07-14 DIAGNOSIS — I517 Cardiomegaly: Secondary | ICD-10-CM | POA: Diagnosis not present

## 2021-07-14 DIAGNOSIS — Z91040 Latex allergy status: Secondary | ICD-10-CM | POA: Diagnosis not present

## 2021-07-14 DIAGNOSIS — R55 Syncope and collapse: Secondary | ICD-10-CM

## 2021-07-14 DIAGNOSIS — E871 Hypo-osmolality and hyponatremia: Secondary | ICD-10-CM | POA: Diagnosis not present

## 2021-07-14 DIAGNOSIS — I10 Essential (primary) hypertension: Secondary | ICD-10-CM | POA: Diagnosis not present

## 2021-07-14 DIAGNOSIS — Z87891 Personal history of nicotine dependence: Secondary | ICD-10-CM | POA: Insufficient documentation

## 2021-07-14 DIAGNOSIS — R0902 Hypoxemia: Secondary | ICD-10-CM | POA: Diagnosis not present

## 2021-07-14 DIAGNOSIS — I959 Hypotension, unspecified: Secondary | ICD-10-CM | POA: Diagnosis not present

## 2021-07-14 DIAGNOSIS — Z79899 Other long term (current) drug therapy: Secondary | ICD-10-CM | POA: Insufficient documentation

## 2021-07-14 DIAGNOSIS — Z8673 Personal history of transient ischemic attack (TIA), and cerebral infarction without residual deficits: Secondary | ICD-10-CM | POA: Insufficient documentation

## 2021-07-14 DIAGNOSIS — R0602 Shortness of breath: Secondary | ICD-10-CM | POA: Diagnosis not present

## 2021-07-14 DIAGNOSIS — R0600 Dyspnea, unspecified: Secondary | ICD-10-CM | POA: Diagnosis not present

## 2021-07-14 DIAGNOSIS — J45909 Unspecified asthma, uncomplicated: Secondary | ICD-10-CM | POA: Insufficient documentation

## 2021-07-14 DIAGNOSIS — R231 Pallor: Secondary | ICD-10-CM | POA: Diagnosis not present

## 2021-07-14 LAB — CBC WITH DIFFERENTIAL/PLATELET
Abs Immature Granulocytes: 0.03 10*3/uL (ref 0.00–0.07)
Basophils Absolute: 0 10*3/uL (ref 0.0–0.1)
Basophils Relative: 1 %
Eosinophils Absolute: 0.2 10*3/uL (ref 0.0–0.5)
Eosinophils Relative: 2 %
HCT: 36.4 % (ref 36.0–46.0)
Hemoglobin: 12 g/dL (ref 12.0–15.0)
Immature Granulocytes: 0 %
Lymphocytes Relative: 18 %
Lymphs Abs: 1.3 10*3/uL (ref 0.7–4.0)
MCH: 30.1 pg (ref 26.0–34.0)
MCHC: 33 g/dL (ref 30.0–36.0)
MCV: 91.2 fL (ref 80.0–100.0)
Monocytes Absolute: 0.7 10*3/uL (ref 0.1–1.0)
Monocytes Relative: 10 %
Neutro Abs: 4.9 10*3/uL (ref 1.7–7.7)
Neutrophils Relative %: 69 %
Platelets: 231 10*3/uL (ref 150–400)
RBC: 3.99 MIL/uL (ref 3.87–5.11)
RDW: 12.2 % (ref 11.5–15.5)
WBC: 7.1 10*3/uL (ref 4.0–10.5)
nRBC: 0 % (ref 0.0–0.2)

## 2021-07-14 LAB — COMPREHENSIVE METABOLIC PANEL
ALT: 14 U/L (ref 0–44)
AST: 17 U/L (ref 15–41)
Albumin: 3.6 g/dL (ref 3.5–5.0)
Alkaline Phosphatase: 76 U/L (ref 38–126)
Anion gap: 7 (ref 5–15)
BUN: 18 mg/dL (ref 8–23)
CO2: 26 mmol/L (ref 22–32)
Calcium: 10.2 mg/dL (ref 8.9–10.3)
Chloride: 92 mmol/L — ABNORMAL LOW (ref 98–111)
Creatinine, Ser: 0.98 mg/dL (ref 0.44–1.00)
GFR, Estimated: 58 mL/min — ABNORMAL LOW (ref >60)
Glucose, Bld: 100 mg/dL — ABNORMAL HIGH (ref 70–99)
Potassium: 4.9 mmol/L (ref 3.5–5.1)
Sodium: 125 mmol/L — ABNORMAL LOW (ref 135–145)
Total Bilirubin: 0.8 mg/dL (ref 0.3–1.2)
Total Protein: 6.8 g/dL (ref 6.5–8.1)

## 2021-07-14 MED ORDER — ONDANSETRON HCL 4 MG/2ML IJ SOLN
4.0000 mg | Freq: Once | INTRAMUSCULAR | Status: DC
  Filled 2021-07-14: qty 2

## 2021-07-14 MED ORDER — SODIUM CHLORIDE 0.9 % IV BOLUS
1000.0000 mL | Freq: Once | INTRAVENOUS | Status: AC
  Administered 2021-07-14: 1000 mL via INTRAVENOUS

## 2021-07-14 NOTE — ED Provider Notes (Signed)
[facility] Provider Note    Event Date/Time   First MD Initiated Contact with Patient 07/14/21 2241     (approximate)   History   Loss of Consciousness   HPI  [name] [name] is a 81 y.o. female medical history of CVA with residual left-sided hemiparesis, hypertension hyperlipidemia GERD asthma who presents after syncopal episode.  Patient was at her care facility she was on the toilet urinating when she suddenly felt very warm and dizzy.  Nurse told her to put her head between her legs but this did not really help.  Report from EMS was that she then walked to the bed and became unresponsive.  They note that she was somewhat confused when they got there but then returned to baseline.  Patient does not remember passing out but does have prodrome of lightheadedness.  Denies any preceding chest pain dyspnea palpitations she does have some nausea currently but no vomiting no abdominal pain fevers chills urinary symptoms.  Says she has had syncopal episodes in the past.    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    Depression with anxiety    GERD (gastroesophageal reflux disease)    Hemiparesis affecting left side as late effect of cerebrovascular accident (CVA) ([place])    Hot flashes    Hyperlipidemia    Hypertension    Muscle hypertonicity    Osteoarthritis    Stroke ([place])    TIA (transient ischemic attack)     Patient Active Problem List   Diagnosis Date Noted   Pain and swelling of lower leg 03/12/2020   Lymphedema 03/12/2020   DJD (degenerative joint disease) 03/12/2020   Acute bronchitis 11/28/2017   Acute renal failure ([place]) 08/27/2017   Bilateral lower extremity edema 07/14/2017   Bilateral chronic knee pain 07/14/2017   Onychomycosis 02/12/2017   SIADH (syndrome of inappropriate ADH production) ([place]) 01/07/2017   Hemiplegia and hemiparesis following cerebral infarction  affecting left non-dominant side ([place]) 05/29/2016   Other sequelae of cerebral infarction 05/29/2016   Pain of left leg 05/29/2016   Pain of left arm 05/29/2016   Major depression, single episode 54/56/2563   Uncomplicated asthma 89/37/3428   Nontoxic thyroid nodule 05/29/2016   Flexion deformity, left ankle and toes 05/29/2016   Contracture, left hand 05/29/2016   Lack of coordination 05/29/2016   Osteoarthritis of left shoulder 05/29/2016   Chronic non-seasonal allergic rhinitis 05/29/2016   Neuropathy 05/29/2016   Overactive bladder 05/29/2016   Corn of toe 05/29/2016   Dry eyes 05/29/2016   Posttraumatic stress disorder 07/21/2015   Essential hypertension, benign 07/21/2015   Hypermyotonia 07/21/2015   HCAP (healthcare-associated pneumonia) 11/19/2014   Chronic hyponatremia 11/18/2014   Mixed incontinence 09/30/2014   Pure hypercholesterolemia 10/22/2013   Cerebral artery occlusion with cerebral infarction ([place]) 04/04/2009   Gastroesophageal reflux disease without esophagitis 10/13/2007   Hypothyroidism 07/23/2006   Hyperlipidemia 07/23/2006   ROSACEA 07/23/2006   Osteopenia 07/23/2006     Physical Exam  Triage Vital Signs: ED Triage Vitals  Enc Vitals Group     BP --      Pulse --      Resp --      Temp --      Temp src --      SpO2 --      Weight 07/14/21 2240 187 lb 6.3 oz (85 kg)  Height 07/14/21 2240 '5\' 1"'$  (1.549 m)     Head Circumference --      Peak Flow --      Pain Score 07/14/21 2239 0     Pain Loc --      Pain Edu? --      Excl. in [place]? --     Most recent vital signs: Vitals:   07/14/21 2243  BP: 122/70  Pulse: 71  Resp: 15  Temp: 97.6 F (36.4 C)  SpO2: 91%     General: Awake, no distress.  Chronically ill-appearing CV:  Good peripheral perfusion.  No peripheral edema Resp:  Normal effort.  No increased work of breathing Abd:  No distention.  Obese abdomen nontender Neuro:             Awake, Alert, Oriented x 3 left-sided facial  droop left-sided hemiparesis  Other:     ED Results / Procedures / Treatments  Labs (all labs ordered are listed, but only abnormal results are displayed) Labs Reviewed  COMPREHENSIVE METABOLIC PANEL - Abnormal; Notable for the following components:      Result Value   Sodium 125 (*)    Chloride 92 (*)    Glucose, Bld 100 (*)    GFR, Estimated 58 (*)    All other components within normal limits  CBC WITH DIFFERENTIAL/PLATELET  URINALYSIS, ROUTINE W REFLEX MICROSCOPIC     EKG  EKG interpretation performed by myself: NSR, nml axis, nml intervals, no acute ischemic changes     RADIOLOGY    PROCEDURES:  Critical Care performed: No  .1-3 Lead EKG Interpretation  Performed by: Rada Hay, MD Authorized by: Rada Hay, MD     Interpretation: normal     ECG rate assessment: normal     Rhythm: sinus rhythm     Ectopy: none     Conduction: normal     The patient is on the cardiac monitor to evaluate for evidence of arrhythmia and/or significant heart rate changes.   MEDICATIONS ORDERED IN ED: Medications  sodium chloride 0.9 % bolus 1,000 mL (has no administration in time range)  ondansetron (ZOFRAN) injection 4 mg (has no administration in time range)     IMPRESSION / MDM / ASSESSMENT AND PLAN / ED COURSE  I reviewed the triage vital signs and the nursing notes.                              Patient's presentation is most consistent with acute presentation with potential threat to life or bodily function.  Differential diagnosis includes, but is not limited to, vasovagal episode, anemia, hypovolemia, AKI, cardiac arrhythmia  Patient is an 81 year old female who presents after syncopal episode at her facility.  Wall on the toilet urinating she felt very hot and then lightheaded subsequently syncopized.  She had no preceding chest pain palpitations or dyspnea and otherwise feels well other than some nausea.  On exam she has chronic left-sided  weakness does not appear volume overloaded abdomen is soft nontender and she is alert and oriented.  Plan to check basic labs EKG and give a liter of fluid.  If patient is feeling okay and work-up is reassuring I think she be appropriate for discharge as this sounds more like a vasovagal episode rather than cardiac in etiology given her significant prodrome and the fact that it occurred shortly after micturition       FINAL CLINICAL IMPRESSION(S) /  ED DIAGNOSES   Final diagnoses:  Syncope, unspecified syncope type     Rx / DC Orders   ED Discharge Orders     None        Note:  This document was prepared using Dragon voice recognition software and may include unintentional dictation errors.   Rada Hay, MD 07/14/21 2329

## 2021-07-14 NOTE — ED Triage Notes (Signed)
Pt comes from [facility] and [facility] [place] with c/o syncopal episode after using the bathroom and getting into bed.Pt states she felt hot and lightheaded when this happened. Pt states she remembers what happened. A&Ox4. Hx of stroke. L sided facial droop

## 2021-07-15 ENCOUNTER — Other Ambulatory Visit: Payer: Self-pay

## 2021-07-15 DIAGNOSIS — R5381 Other malaise: Secondary | ICD-10-CM | POA: Diagnosis not present

## 2021-07-15 DIAGNOSIS — Z7401 Bed confinement status: Secondary | ICD-10-CM | POA: Diagnosis not present

## 2021-07-15 DIAGNOSIS — R29898 Other symptoms and signs involving the musculoskeletal system: Secondary | ICD-10-CM | POA: Diagnosis not present

## 2021-07-15 DIAGNOSIS — E871 Hypo-osmolality and hyponatremia: Secondary | ICD-10-CM | POA: Diagnosis not present

## 2021-07-15 LAB — BASIC METABOLIC PANEL
Anion gap: 6 (ref 5–15)
Anion gap: 6 (ref 5–15)
BUN: 16 mg/dL (ref 8–23)
BUN: 15 mg/dL (ref 8–23)
CO2: 24 mmol/L (ref 22–32)
CO2: 27 mmol/L (ref 22–32)
Calcium: 9.5 mg/dL (ref 8.9–10.3)
Calcium: 9.7 mg/dL (ref 8.9–10.3)
Chloride: 97 mmol/L — ABNORMAL LOW (ref 98–111)
Chloride: 96 mmol/L — ABNORMAL LOW (ref 98–111)
Creatinine, Ser: 0.79 mg/dL (ref 0.44–1.00)
Creatinine, Ser: 0.9 mg/dL (ref 0.44–1.00)
GFR, Estimated: >60 mL/min (ref >60)
GFR, Estimated: >60 mL/min (ref >60)
Glucose, Bld: 95 mg/dL (ref 70–99)
Glucose, Bld: 104 mg/dL — ABNORMAL HIGH (ref 70–99)
Potassium: 4.5 mmol/L (ref 3.5–5.1)
Potassium: 4.3 mmol/L (ref 3.5–5.1)
Sodium: 127 mmol/L — ABNORMAL LOW (ref 135–145)
Sodium: 129 mmol/L — ABNORMAL LOW (ref 135–145)

## 2021-07-15 LAB — URINALYSIS, ROUTINE W REFLEX MICROSCOPIC
Bilirubin Urine: NEGATIVE
Glucose, UA: NEGATIVE mg/dL
Hgb urine dipstick: NEGATIVE
Ketones, ur: 20 mg/dL — AB
Leukocytes,Ua: NEGATIVE
Nitrite: NEGATIVE
Protein, ur: NEGATIVE mg/dL
Specific Gravity, Urine: 1.011 (ref 1.005–1.030)
pH: 6 (ref 5.0–8.0)

## 2021-07-15 LAB — OSMOLALITY, URINE: Osmolality, Ur: 346 mOsm/kg (ref 300–900)

## 2021-07-15 LAB — SODIUM, URINE, RANDOM: Sodium, Ur: 56 mmol/L

## 2021-07-15 LAB — BRAIN NATRIURETIC PEPTIDE: B Natriuretic Peptide: 23.4 pg/mL (ref 0.0–100.0)

## 2021-07-15 LAB — TSH: TSH: 3.629 u[IU]/mL (ref 0.350–4.500)

## 2021-07-15 LAB — OSMOLALITY: Osmolality: 269 mOsm/kg — ABNORMAL LOW (ref 275–295)

## 2021-07-15 MED ORDER — FUROSEMIDE 10 MG/ML IJ SOLN
40.0000 mg | Freq: Once | INTRAMUSCULAR | Status: AC
  Administered 2021-07-15: 40 mg via INTRAVENOUS
  Filled 2021-07-15: qty 4

## 2021-07-15 MED ORDER — ENOXAPARIN SODIUM 40 MG/0.4ML IJ SOSY: 40.0000 mg | PREFILLED_SYRINGE | Freq: Every day | INTRAMUSCULAR | Status: DC

## 2021-07-15 MED ORDER — HYDROCODONE-ACETAMINOPHEN 5-325 MG PO TABS: 1.0000 | ORAL_TABLET | ORAL | Status: DC | PRN

## 2021-07-15 MED ORDER — ENOXAPARIN SODIUM 60 MG/0.6ML IJ SOSY
0.5000 mg/kg | PREFILLED_SYRINGE | INTRAMUSCULAR | Status: DC
Start: 2021-07-15 — End: 2021-07-15
  Administered 2021-07-15: 42.5 mg via SUBCUTANEOUS
  Filled 2021-07-15: qty 0.6

## 2021-07-15 MED ORDER — SODIUM CHLORIDE 1 G PO TABS: 1.0000 g | ORAL_TABLET | Freq: Three times a day (TID) | ORAL | Status: AC

## 2021-07-15 MED ORDER — SODIUM CHLORIDE 1 G PO TABS
1.0000 g | ORAL_TABLET | Freq: Two times a day (BID) | ORAL | Status: DC
  Filled 2021-07-15: qty 1

## 2021-07-15 MED ORDER — GUAIFENESIN ER 600 MG PO TB12: 1200.0000 mg | ORAL_TABLET | Freq: Two times a day (BID) | ORAL | Status: AC | PRN

## 2021-07-15 MED ORDER — ACETAMINOPHEN 325 MG PO TABS: 650.0000 mg | ORAL_TABLET | Freq: Four times a day (QID) | ORAL | Status: DC | PRN

## 2021-07-15 MED ORDER — MORPHINE SULFATE (PF) 2 MG/ML IV SOLN: 2.0000 mg | INTRAVENOUS | Status: DC | PRN

## 2021-07-15 MED ORDER — ONDANSETRON HCL 4 MG/2ML IJ SOLN: 4.0000 mg | Freq: Four times a day (QID) | INTRAMUSCULAR | Status: DC | PRN

## 2021-07-15 MED ORDER — ACETAMINOPHEN 650 MG RE SUPP: 650.0000 mg | Freq: Four times a day (QID) | RECTAL | Status: DC | PRN

## 2021-07-15 NOTE — Progress Notes (Signed)
Anticoagulation monitoring(Lovenox):  81 yo female ordered Lovenox 40 mg Q24h    Filed Weights   07/14/21 2240  Weight: 85 kg (187 lb 6.3 oz)   BMI 35.4    Lab Results  Component Value Date   CREATININE 0.98 07/14/2021   CREATININE 0.81 02/17/2018   CREATININE 0.78 01/08/2018   Estimated Creatinine Clearance: 44.6 mL/min (by C-G formula based on SCr of 0.98 mg/dL). Hemoglobin & Hematocrit     Component Value Date/Time   HGB 12.0 07/14/2021 2254   HGB 13.5 03/29/2013 0544   HCT 36.4 07/14/2021 2254   HCT 39.0 03/29/2013 0544     Per Protocol for Patient with estCrcl > 30 ml/min and BMI > 30, will transition to Lovenox 42.5 mg Q24h.

## 2021-07-15 NOTE — NC FL2 (Signed)
[place] LEVEL OF CARE SCREENING TOOL     IDENTIFICATION  Patient Name: [name] Birthdate: 11-May-1940 Sex: female Admission Date (Current Location): 07/14/2021  [facility] and [place] Number:  Engineering geologist and Address:  [facility], [address] [place], [place] [address]      Provider Number: [id number]  Attending Physician Name and Address:  Enzo Bi, MD  Relative Name and Phone Number:  Denyse Amass (spouse) [phone]    Current Level of Care: Hospital Recommended Level of Care: [place] Prior Approval Number:    Date Approved/Denied:   PASRR Number: [id number]  Discharge Plan: SNF    Current Diagnoses: Patient Active Problem List   Diagnosis Date Noted   Hyponatremia 07/14/2021   Pain and swelling of lower leg 03/12/2020   Lymphedema 03/12/2020   DJD (degenerative joint disease) 03/12/2020   Acute bronchitis 11/28/2017   Acute renal failure ([place]) 08/27/2017   Bilateral lower extremity edema 07/14/2017   Bilateral chronic knee pain 07/14/2017   Onychomycosis 02/12/2017   SIADH (syndrome of inappropriate ADH production) ([place]) 01/07/2017   Hemiplegia and hemiparesis following cerebral infarction affecting left non-dominant side ([place]) 05/29/2016   Other sequelae of cerebral infarction 05/29/2016   Pain of left leg 05/29/2016   Pain of left arm 05/29/2016   Major depression, single episode 18/29/9371   Uncomplicated asthma 69/67/8938   Nontoxic thyroid nodule 05/29/2016   Flexion deformity, left ankle and toes 05/29/2016   Contracture, left hand 05/29/2016   Lack of coordination 05/29/2016   Osteoarthritis of left shoulder 05/29/2016   Chronic non-seasonal allergic rhinitis 05/29/2016   Neuropathy 05/29/2016   Overactive bladder 05/29/2016   Corn of toe 05/29/2016   Dry eyes 05/29/2016   Posttraumatic stress disorder 07/21/2015   Essential hypertension, benign 07/21/2015   Hypermyotonia  07/21/2015   HCAP (healthcare-associated pneumonia) 11/19/2014   Chronic hyponatremia 11/18/2014   Mixed incontinence 09/30/2014   Pure hypercholesterolemia 10/22/2013   Cerebral artery occlusion with cerebral infarction ([place]) 04/04/2009   Gastroesophageal reflux disease without esophagitis 10/13/2007   Hypothyroidism 07/23/2006   Hyperlipidemia 07/23/2006   ROSACEA 07/23/2006   Osteopenia 07/23/2006    Orientation RESPIRATION BLADDER Height & Weight     Self, Place, Time, Situation  Normal Incontinent, External catheter Weight: 187 lb 6.3 oz (85 kg) Height:  '5\' 1"'$  (154.9 cm)  BEHAVIORAL SYMPTOMS/MOOD NEUROLOGICAL BOWEL NUTRITION STATUS      Continent Diet (see discharge summary)  AMBULATORY STATUS COMMUNICATION OF NEEDS Skin   Limited Assist Verbally Normal                       Personal Care Assistance Level of Assistance  Bathing, Feeding, Dressing, Total care Bathing Assistance: Limited assistance Feeding assistance: Independent Dressing Assistance: Limited assistance Total Care Assistance: Limited assistance   Functional Limitations Info  Sight, Hearing, Speech Sight Info: Impaired Hearing Info: Adequate Speech Info: Adequate    SPECIAL CARE FACTORS FREQUENCY                       Contractures Contractures Info: Not present    Additional Factors Info  Code Status, Allergies Code Status Info: full Allergies Info: Tizanidine   Ace Inhibitors   Latex   Lisinopril   Tetracycline   Zanaflex (Tizanidine Hcl)           Current Medications (07/15/2021):  This is the current hospital active medication list Current Facility-Administered Medications  Medication Dose Route Frequency Provider Last Rate Last Admin   acetaminophen (TYLENOL) tablet 650 mg  650 mg Oral Q6H PRN Imagene Sheller S, DO       Or   acetaminophen (TYLENOL) suppository 650 mg  650 mg Rectal Q6H PRN Imagene Sheller S, DO       enoxaparin (LOVENOX) injection 40 mg  40 mg Subcutaneous QHS  Dorothe Pea, RPH       morphine (PF) 2 MG/ML injection 2 mg  2 mg Intravenous Q2H PRN Imagene Sheller S, DO       ondansetron (ZOFRAN) injection 4 mg  4 mg Intravenous Once Rada Hay, MD       ondansetron [facility]) injection 4 mg  4 mg Intravenous Q6H PRN Imagene Sheller S, DO       sodium chloride tablet 1 g  1 g Oral BID WC Enzo Bi, MD         Discharge Medications: Please see discharge summary for a list of discharge medications.  Relevant Imaging Results:  Relevant Lab Results:   Additional Information EVO:[id number]  Alberteen Sam, LCSW

## 2021-07-15 NOTE — H&P (Signed)
H&P:    [name]   LZJ:[id number] DOB: Dec 12, 1940 DOA: 07/14/2021  PCP: Kirk Ruths, MD  Chief Complaint: syncope   History of Present Illness:    HPI: [name] is a 81 y.o. female with a past medical history of obesity, SIADH, hypertension, hyperlipidemia, history of CVA with residual left sided weakness. This patients presents from local nursing home after a syncopal episode post-micturition. Only prodromal symptom was lightheadedness and dizziness. Workup in the ED found a Na of 125 but appears asymptomatic.  States she has been eating and drinking okay.  No diarrhea.  No chest pain.  Shortness of breath.  No heart palpitations.  Only complaint is her hands feeling cold for the past 1 week.    ED Course: Lab work showing a sodium of 125.  X-ray showed interstitial edema, cardiomegaly and vascular congestion but the patient is not hypoxic.  Further labs pending.  Given 1 L of normal saline in the emergency department.   ROS:   13 point review of systems is negative except for what is mentioned above in the HPI.   Past Medical History:   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    Depression with anxiety    GERD (gastroesophageal reflux disease)    Hemiparesis affecting left side as late effect of cerebrovascular accident (CVA) ([place])    Hot flashes    Hyperlipidemia    Hypertension    Muscle hypertonicity    Osteoarthritis    Stroke ([place])    TIA (transient ischemic attack)     Past Surgical History:   Past Surgical History:  Procedure Laterality Date   ACHILLES TENDON LENGTHENING  08/25/2015   Procedure: ACHILLES TENDON LENGTHENING;  Surgeon: Samara Deist, DPM;  Location: [facility] ORS;  Service: Podiatry;;   APPENDECTOMY     COLONOSCOPY  06/17/2008   EYE SURGERY Bilateral    Cataract Extraction with IOL   FLAT FOOT RECONSTRUCTION-TAL GASTROC RECESSION Left 08/25/2015    Procedure: ENDOSCOPIC FLAT FOOT RECONSTRUCTION-TAL GASTROC RECESSION;  Surgeon: Samara Deist, DPM;  Location: [facility] ORS;  Service: Podiatry;  Laterality: Left;   VAGINAL HYSTERECTOMY      Social History:   Social History   Socioeconomic History   Marital status: Married    Spouse name: Not on file   Number of children: Not on file   Years of education: Not on file   Highest education level: Not on file  Occupational History   Occupation: retired  Tobacco Use   Smoking status: Former    Packs/day: 0.50    Years: 3.00    Total pack years: 1.50    Types: Cigarettes    Quit date: 01/08/1963    Years since quitting: 58.5   Smokeless tobacco: Never  Vaping Use   Vaping Use: Never used  Substance and Sexual Activity   Alcohol use: No    Alcohol/week: 0.0 standard drinks of alcohol    Comment: does not drink alcohol   Drug use: No   Sexual activity: Not on file  Other Topics Concern   Not on file  Social History Narrative   Admitted to [place] [place] 11/30/2014   Married   Former smoker   Full Code   Social Determinants of Radio broadcast assistant Strain: Not on file  Food Insecurity: Not on file  Transportation Needs: Not  on file  Physical Activity: Not on file  Stress: Not on file  Social Connections: Not on file  Intimate Partner Violence: Not on file    Allergies:   Allergies  Allergen Reactions   Tizanidine Other (See Comments)    HYPOTENSION AND UNRESPONSIVENESS   Ace Inhibitors Cough   Latex Hives   Lisinopril Cough   Tetracycline Other (See Comments)     REACTION: yeast   Zanaflex [Tizanidine Hcl] Other (See Comments)    "drowsy"    Family History:   Family History  Problem Relation Age of Onset   Alcohol abuse Mother    Coronary artery disease Mother    Stroke Mother    Liver cancer Father    Alcohol abuse Brother    Coronary artery disease Brother    Diabetes type II Brother    Liver cancer Brother      Current  Medications:   Prior to Admission medications   Medication Sig Start Date End Date Taking? Authorizing Provider  acetaminophen (TYLENOL) 325 MG tablet Take 650 mg by mouth every 6 (six) hours as needed. for pain/ increased temp. May be administered orally, per G-tube if needed or rectally if unable to swallow (separate order). Maximum dose for 24 hours is 3,000 mg from all sources of Acetaminophen/ Tylenol    [provider]  alum & mag hydroxide-simeth (MAALOX PLUS) 400-400-40 MG/5ML suspension Take 30 mLs by mouth every 4 (four) hours as needed for indigestion.     [provider]  Artificial Saliva (BIOTENE MOISTURIZING MOUTH MT) Use as directed 2 sprays in the mouth or throat 4 (four) times daily as needed.    [provider]  Calcium Carbonate-Vitamin D 600-400 MG-UNIT tablet Take 1 tablet by mouth 2 (two) times daily.    [provider]  Carboxymeth-Glycerin-Polysorb 0.5-1-0.5 % SOLN Place 2 drops into both eyes 2 (two) times daily. 08/19/17   [provider]  carvedilol (COREG) 3.125 MG tablet Take 3.125 mg by mouth 2 (two) times daily.     [provider]  cetirizine (ZYRTEC) 5 MG tablet Take 5 mg by mouth at bedtime.    [provider]  cyanocobalamin 1000 MCG tablet Take 1,000 mcg by mouth daily.    [provider]  demeclocycline (DECLOMYCIN) 300 MG tablet Take 300 mg by mouth 2 (two) times daily.     [provider]  dextromethorphan (DELSYM) 30 MG/5ML liquid Take 10 mLs by mouth every 12 (twelve) hours as needed for cough.    [provider]  diclofenac Sodium (VOLTAREN) 1 % GEL Apply 1 application topically 4 (four) times daily. 01/14/20   [provider]  dipyridamole-aspirin (AGGRENOX) 200-25 MG 12hr capsule Take 1 capsule by mouth every 12 (twelve) hours.     [provider]  divalproex (DEPAKOTE SPRINKLE) 125 MG capsule Take 125 mg by mouth 2 (two) times daily.  01/18/18    [provider]  divalproex (DEPAKOTE) 125 MG DR tablet Take 125 mg by mouth 2 (two) times daily. 02/15/20   [provider]  Docosanol 10 % CREA Apply topically to cold sore on upper lip two times daily 01/28/18   [provider]  fluticasone furoate-vilanterol (BREO ELLIPTA) 100-25 MCG/INH AEPB Inhale 1 puff into the lungs daily.     [provider]  gabapentin (NEURONTIN) 600 MG tablet Take 600 mg by mouth 3 (three) times daily.  01/12/18   [provider]  guaiFENesin ([place]) 600 MG 12  hr tablet Take 1,200 mg by mouth every 12 (twelve) hours.     [provider]  ipratropium-albuterol (DUONEB) 0.5-2.5 (3) MG/3ML SOLN Take 3 mLs by nebulization every 6 (six) hours as needed.  12/06/17   [provider]  levothyroxine (SYNTHROID, LEVOTHROID) 75 MCG tablet Take 75 mcg by mouth daily before breakfast.  11/29/17   [provider]  losartan (COZAAR) 50 MG tablet Take 1 tablet by mouth 2 (two) times daily. 05/11/14   [provider]  magnesium hydroxide (MILK OF MAGNESIA) 400 MG/5ML suspension Take 30 mLs by mouth every 4 (four) hours as needed for mild constipation.    [provider]  Menthol (HALLS COUGH DROPS MT) Take 3.2 mg by mouth as needed    [provider]  Menthol, Topical Analgesic, 4 % GEL Apply 1 application topically 2 (two) times daily. Apply to bilateral lower extremities    [provider]  montelukast (SINGULAIR) 10 MG tablet Take 10 mg by mouth at bedtime.     [provider]  omeprazole (PRILOSEC) 20 MG capsule Take 20 mg by mouth 2 (two) times daily.     [provider]  oxybutynin (DITROPAN) 5 MG tablet Take 5 mg by mouth 2 (two) times daily.     [provider]  OXYGEN O2 as needed.  Titrate to keep sat > / = 92 01/22/18   [provider]  polyethylene glycol (MIRALAX / GLYCOLAX) packet Take 17 g by mouth daily. dx: constipation  Hold if loose  stool    [provider]  prazosin (MINIPRESS) 1 MG capsule Take 3 mg by mouth at bedtime. 3 capsules    [provider]  predniSONE (DELTASONE) 20 MG tablet Take 20 mg by mouth daily. 01/11/20   [provider]  Probiotic Product (ACIDOPHILUS) CHEW Chew by mouth at bedtime. 01/11/20   [provider]  Salicylic Acid 40 % PADS Apply 1 corn pad to Right 5th toe daily until corn resolved    [provider]  sennosides-docusate sodium (SENOKOT-S) 8.6-50 MG tablet Take 2 tablets by mouth 2 (two) times daily as needed for constipation. Also take 1 tab by mouth at bedtime daily. 10/31/17   [provider]  Skin Protectants, Misc. (ENDIT EX) Apply liberal amount topically to areas of skin irritation as needed. Ok to leave at bedside.    [provider]  sodium chloride 1 g tablet Take 1 g by mouth daily.     [provider]  spironolactone (ALDACTONE) 50 MG tablet Take 50 mg by mouth daily.    [provider]  THERATEARS 0.25 % SOLN Apply to eye. 02/10/20   [provider]  torsemide (DEMADEX) 10 MG tablet Take 10 mg by mouth daily.     [provider]  triamcinolone (NASACORT) 55 MCG/ACT AERO nasal inhaler Place 1 spray into the nose daily.    [provider]  UNABLE TO FIND Diet Type: Regular. No Sodium Restriction. Gatorade on lunch and supper trays for hyponatremia    [provider]  venlafaxine XR (EFFEXOR-XR) 75 MG 24 hr capsule Take 75 mg by mouth daily with breakfast.    [provider]     Physical Exam:   Vitals:   07/14/21 2240 07/14/21 2243  BP:  122/70  Pulse:  71  Resp:  15  Temp:  97.6 F (36.4 C)  TempSrc:  Oral  SpO2:  91%  Weight: 85 kg   Height:  $'5\' 1"'g$  (1.549 m)      General:  Appears calm and comfortable and is in NAD Cardiovascular:  RRR, no m/r/g.  Respiratory:   CTA bilaterally with no wheezes/rales/rhonchi.  Normal respiratory effort. Abdomen:   soft, NT, ND, NABS Skin:  no rash or induration seen on limited exam Musculoskeletal:  grossly normal tone BUE/BLE, good ROM, no bony abnormality Lower extremity:  No LE edema.  Limited foot exam with no ulcerations.  2+ distal pulses. Psychiatric:  AOx3 Neurologic:  CN 2-12 grossly intact, left-sided weakness.  Left upper extremity hemiplegia and left-sided weakness but able to lift leg off the hospital gurney    Data Review:    Radiological Exams on Admission: Independently reviewed - see discussion in A/P where applicable  DG Chest Portable 1 View  Result Date: 07/15/2021 CLINICAL DATA:  Shortness of breath EXAM: PORTABLE CHEST 1 VIEW COMPARISON:  11/26/2014 FINDINGS: Cardiomegaly, vascular congestion. Interstitial prominence may reflect interstitial edema. Low lung volumes. No effusions or acute bony abnormality. IMPRESSION: Cardiomegaly with vascular congestion and possible early interstitial edema. Electronically Signed   By: Rolm Baptise M.D.   On: 07/15/2021 00:00    EKG: Independently reviewed.  SR rate 169 bpm   Labs on Admission: I have personally reviewed the available labs and imaging studies at the time of the admission.  Pertinent labs on Admission: Na 125     Assessment/Plan:   Acute on chronic hyponatremia: Na 125. Asymptomatic therefore no indication for hypertonic saline. Hx of SIADH noted on chart. Obtain serum osmolality, TSH, urine Na, and urine osmolality.   Syncope: likely vasovagal post-micturition. No prodromal symptoms. No further workup indicated.  Hx of CVA: with residual left sided weakness.  Denies any transfer dysphagia.  Not on any modified diet.  Obesity: BMI 35. No acute treatment  Hypertension: Resume once medication reconciliation completed  Hyperlipidemia: Resume once medication reconciliation completed  GERD: Resume once medication reconciliation completed.   Other information:    Level of Care: med tele DVT prophylaxis:  lovenox Code Status: full code Consults: nephrology Admission status:  observation   [place] [facility]   How to contact the [employer] [facility] [employer] Attending or Consulting provider [place] or covering provider during after hours [place], for this patient?  Check the care team in [facility] and look for a) attending/consulting TRH provider listed and b) the [facility] team listed Log into [email or web address] and use [place]'s universal password to access. If you do not have the password, please contact the hospital operator. Locate the [facility] provider you are looking for under [facility] and page to a number that you can be directly reached. If you still have difficulty reaching the provider, please page the [facility] (Director on Call) for the Hospitalists listed on amion for assistance.   07/15/2021, 12:06 AM

## 2021-07-15 NOTE — TOC Transition Note (Signed)
Transition of Care [facility]) - CM/SW Discharge Note   Patient Details  Name: Lakota Schweppe MRN: [id number] Date of Birth: [birth year]  [facility] of [facility]) CM/SW Contact:  Alberteen Sam, LCSW Phone Number: 07/15/2021, 1:04 PM   Clinical Narrative:     Patient will DC to: [facility] Anticipated DC date: 07/15/21 Transport by: Johnanna Schneiders  Per MD patient ready for DC to [facility]. RN, patient, patient's family, and facility notified of DC. Discharge Summary sent to facility. RN given number for report  [phone]. DC packet on chart. Ambulance transport requested for patient.  CSW signing off.  Pricilla Riffle, LCSW   Final next level of care: Skilled Nursing Facility Barriers to Discharge: No Barriers Identified   Patient Goals and CMS Choice Patient states their goals for this hospitalization and ongoing recovery are:: to go home CMS Medicare.gov Compare Post Acute Care list provided to:: Patient Choice offered to / list presented to : Patient  Discharge Placement                       Discharge Plan and Services                                     Social Determinants of Health (SDOH) Interventions     Readmission Risk Interventions     No data to display

## 2021-07-15 NOTE — Plan of Care (Signed)
Patient discharged per MD orders at this time.All discharge instructions,education and medications reviewed with the patient.Pt expressed understanding and will comply with dc instructions.Pt was discharged to the [facility] per order. report was called to staff Pilar Jarvis before transport.Pt was transported by 2 [facility] personnel on a stretcher.

## 2021-07-15 NOTE — Discharge Summary (Signed)
Physician Discharge Summary   [name]  female DOB: [birth year]  XBD:[id number]  PCP: Alvester Morin, MD  Admit date: 07/14/2021 Discharge date: 07/15/2021  Admitted From: SNF Disposition:  SNF CODE STATUS: Full code   Hospital Course:  For full details, please see H&P, progress notes, consult notes and ancillary notes.  Briefly,  [name] is a 81 y.o. female with a past medical history of obesity, SIADH, hypertension, history of CVA with residual left sided weakness.  This patients presented from local nursing home after a syncopal episode post-micturition. Only prodromal symptom was lightheadedness and dizziness. Workup in the ED found a Na of 125 but appears asymptomatic.  States she has been eating and drinking okay.   Chronic hyponatremia 2/2 SIADH Hx of SIADH noted on chart.  Pt reported she used to take salt tablets but said it was stopped because her Na got better.  Home med list has pt on salt tablet 1g daily.   --serum osm 269, urine osm 346, urine Na 56, results consistent with SIADH. --pt is discharged on salt tablet 1g TID with meals.   Syncope likely vasovagal post-micturition.  No further workup indicated.   Hx of CVA:  with residual left sided weakness.   --cont home AGGRENOX   Obesity: BMI 35.    Hypertension:  --cont home coreg, spironolactone and torsemide   GERD:  --cont home PPI  Unless noted above, medications under "STOP" list are ones pt was not taking PTA.  Discharge Diagnoses:  Principal Problem:   Hyponatremia     Discharge Instructions:  Allergies as of 07/15/2021       Reactions   Tizanidine Other (See Comments)   HYPOTENSION AND UNRESPONSIVENESS   Ace Inhibitors Cough   Latex Hives   Lisinopril Cough   Tetracycline Other (See Comments)   REACTION: yeast   Zanaflex [tizanidine Hcl] Other (See Comments)   "drowsy"        Medication List     STOP taking these medications    Acidophilus Chew    atropine 1 % ophthalmic solution   BIOTENE MOISTURIZING MOUTH MT   Breo Ellipta 100-25 MCG/ACT Aepb Generic drug: fluticasone furoate-vilanterol   Carboxymeth-Glycerin-Polysorb 0.5-1-0.5 % Soln   cetirizine 5 MG tablet Commonly known as: ZYRTEC   cyanocobalamin 1000 MCG tablet   dextromethorphan 30 MG/5ML liquid Commonly known as: DELSYM   diclofenac Sodium 1 % Gel Commonly known as: VOLTAREN   Docosanol 10 % Crea   HALLS COUGH DROPS MT   losartan 50 MG tablet Commonly known as: COZAAR   magnesium hydroxide 400 MG/5ML suspension Commonly known as: MILK OF MAGNESIA   oxybutynin 5 MG tablet Commonly known as: DITROPAN   predniSONE 20 MG tablet Commonly known as: DELTASONE   Salicylic Acid 40 % Pads       TAKE these medications    acetaminophen 500 MG tablet Commonly known as: TYLENOL Take 1,000 mg by mouth every 6 (six) hours as needed (osteoarthritis of left shoulder).   alum & mag hydroxide-simeth [phone] MG/5ML suspension Commonly known as: MAALOX PLUS Take 30 mLs by mouth every 4 (four) hours as needed for indigestion.   bisacodyl 5 MG EC tablet Commonly known as: DULCOLAX Take 5 mg by mouth every other day.   Calcium Carbonate-Vitamin D 600-400 MG-UNIT tablet Take 1 tablet by mouth 2 (two) times daily.   carvedilol 3.125 MG tablet Commonly known as: COREG Take 3.125 mg by mouth 2 (two) times daily.  demeclocycline 300 MG tablet Commonly known as: DECLOMYCIN Take 300 mg by mouth 2 (two) times daily.   dipyridamole-aspirin 200-25 MG 12hr capsule Commonly known as: AGGRENOX Take 1 capsule by mouth every 12 (twelve) hours.   divalproex 125 MG DR tablet Commonly known as: DEPAKOTE Take 125 mg by mouth 2 (two) times daily. What changed: Another medication with the same name was removed. Continue taking this medication, and follow the directions you see here.   ENDIT EX Apply liberal amount topically to areas of skin irritation as needed.  Ok to leave at bedside.   Eucerin Itch Relief 0.1 % Lotn Generic drug: Menthol (Topical Analgesic) Apply topically 2 (two) times daily. To dry area on left breast What changed: Another medication with the same name was removed. Continue taking this medication, and follow the directions you see here.   gabapentin 800 MG tablet Commonly known as: NEURONTIN Take 800 mg by mouth every 8 (eight) hours.   guaiFENesin 600 MG 12 hr tablet Commonly known as: MUCINEX Take 2 tablets (1,200 mg total) by mouth 2 (two) times daily as needed. Home med. What changed:  when to take this reasons to take this additional instructions   hydrocortisone cream 1 % Apply 1 Application topically 2 (two) times daily. hemorrhoids   ipratropium-albuterol 0.5-2.5 (3) MG/3ML Soln Commonly known as: DUONEB Take 3 mLs by nebulization every 6 (six) hours as needed.   levothyroxine 75 MCG tablet Commonly known as: SYNTHROID Take 75 mcg by mouth daily before breakfast.   montelukast 10 MG tablet Commonly known as: SINGULAIR Take 10 mg by mouth at bedtime.   nystatin powder Generic drug: nystatin Apply topically 2 (two) times daily.   omeprazole 20 MG capsule Commonly known as: PRILOSEC Take 20 mg by mouth 2 (two) times daily.   OXYGEN O2 as needed.  Titrate to keep sat > / = 92   polyethylene glycol 17 g packet Commonly known as: MIRALAX / GLYCOLAX Take 17 g by mouth daily. dx: constipation  Hold if loose stool   prazosin 1 MG capsule Commonly known as: MINIPRESS Take 3 mg by mouth at bedtime. 3 capsules   sennosides-docusate sodium 8.6-50 MG tablet Commonly known as: SENOKOT-S Take 2 tablets by mouth 2 (two) times daily as needed for constipation. Also take 1 tab by mouth at bedtime daily.   sodium chloride 1 g tablet Take 1 tablet (1 g total) by mouth 3 (three) times daily with meals. What changed: when to take this   spironolactone 50 MG tablet Commonly known as: ALDACTONE Take 50 mg  by mouth daily.   Theratears 0.25 % Soln Generic drug: Carboxymethylcellulose Sodium Apply to eye.   torsemide 10 MG tablet Commonly known as: DEMADEX Take 10 mg by mouth daily.   triamcinolone 55 MCG/ACT Aero nasal inhaler Commonly known as: NASACORT Place 1 spray into the nose daily.   UNABLE TO FIND Diet Type: Regular. No Sodium Restriction. Gatorade on lunch and supper trays for hyponatremia   venlafaxine XR 37.5 MG 24 hr capsule Commonly known as: EFFEXOR-XR Take 37.5 mg by mouth daily.   Vitamin D3 125 MCG (5000 UT) Caps Take 5,000 Units by mouth daily.         Follow-up Information     Alvester Morin, MD Follow up.   Specialty: Family Medicine Contact information: [address] [place] [address] [phone]                 Allergies  Allergen  Reactions   Tizanidine Other (See Comments)    HYPOTENSION AND UNRESPONSIVENESS   Ace Inhibitors Cough   Latex Hives   Lisinopril Cough   Tetracycline Other (See Comments)     REACTION: yeast   Zanaflex [Tizanidine Hcl] Other (See Comments)    "drowsy"     The results of significant diagnostics from this hospitalization (including imaging, microbiology, ancillary and laboratory) are listed below for reference.   Consultations:   Procedures/Studies: DG Chest Portable 1 View  Result Date: 07/15/2021 CLINICAL DATA:  Shortness of breath EXAM: PORTABLE CHEST 1 VIEW COMPARISON:  11/26/2014 FINDINGS: Cardiomegaly, vascular congestion. Interstitial prominence may reflect interstitial edema. Low lung volumes. No effusions or acute bony abnormality. IMPRESSION: Cardiomegaly with vascular congestion and possible early interstitial edema. Electronically Signed   By: Rolm Baptise M.D.   On: 07/15/2021 00:00      Labs: BNP (last 3 results) Recent Labs    07/14/21 2254  BNP 37.1   Basic Metabolic Panel: Recent Labs  Lab 07/14/21 2254 07/15/21 0553 07/15/21 1054  NA 125* 127* 129*   K 4.9 4.5 4.3  CL 92* 97* 96*  CO2 '26 24 27  '$ GLUCOSE 100* 95 104*  BUN '18 16 15  '$ CREATININE 0.98 0.79 0.90  CALCIUM 10.2 9.5 9.7   Liver Function Tests: Recent Labs  Lab 07/14/21 2254  AST 17  ALT 14  ALKPHOS 76  BILITOT 0.8  PROT 6.8  ALBUMIN 3.6   No results for input(s): "LIPASE", "AMYLASE" in the last 168 hours. No results for input(s): "AMMONIA" in the last 168 hours. CBC: Recent Labs  Lab 07/14/21 2254  WBC 7.1  NEUTROABS 4.9  HGB 12.0  HCT 36.4  MCV 91.2  PLT 231   Cardiac Enzymes: No results for input(s): "CKTOTAL", "CKMB", "CKMBINDEX", "TROPONINI" in the last 168 hours. BNP: Invalid input(s): "POCBNP" CBG: No results for input(s): "GLUCAP" in the last 168 hours. D-Dimer No results for input(s): "DDIMER" in the last 72 hours. Hgb A1c No results for input(s): "HGBA1C" in the last 72 hours. Lipid Profile No results for input(s): "CHOL", "HDL", "LDLCALC", "TRIG", "CHOLHDL", "LDLDIRECT" in the last 72 hours. Thyroid function studies Recent Labs    07/14/21 2254  TSH 3.629   Anemia work up No results for input(s): "VITAMINB12", "FOLATE", "FERRITIN", "TIBC", "IRON", "RETICCTPCT" in the last 72 hours. Urinalysis    Component Value Date/Time   COLORURINE YELLOW (A) 07/15/2021 0442   APPEARANCEUR HAZY (A) 07/15/2021 0442   APPEARANCEUR Clear 04/11/2013 1520   LABSPEC 1.011 07/15/2021 0442   LABSPEC 1.014 04/11/2013 1520   PHURINE 6.0 07/15/2021 0442   GLUCOSEU NEGATIVE 07/15/2021 0442   GLUCOSEU Negative 04/11/2013 1520   HGBUR NEGATIVE 07/15/2021 0442   BILIRUBINUR NEGATIVE 07/15/2021 0442   BILIRUBINUR Negative 04/11/2013 1520   KETONESUR 20 (A) 07/15/2021 0442   PROTEINUR NEGATIVE 07/15/2021 0442   UROBILINOGEN 0.2 05/02/2009 0958   NITRITE NEGATIVE 07/15/2021 0442   LEUKOCYTESUR NEGATIVE 07/15/2021 0442   LEUKOCYTESUR Negative 04/11/2013 1520   Sepsis Labs Recent Labs  Lab 07/14/21 2254  WBC 7.1   Microbiology No results found for  this or any previous visit (from the past 240 hour(s)).   Total time spend on discharging this patient, including the last patient exam, discussing the hospital stay, instructions for ongoing care as it relates to all pertinent caregivers, as well as preparing the medical discharge records, prescriptions, and/or referrals as applicable, is 35 minutes.    Enzo Bi, MD  [facility]  07/15/2021, 12:53 PM

## 2021-07-15 NOTE — ED Notes (Signed)
RN repositioned pt in bed. Pt was given warm blankets.

## 2021-07-15 NOTE — ED Notes (Signed)
Patient AOX4. Resp even, unlabored. NSR on monitor at 85. Repositioned for comfort. No distress noted at this time.

## 2021-07-16 DIAGNOSIS — I10 Essential (primary) hypertension: Secondary | ICD-10-CM | POA: Diagnosis not present

## 2021-07-16 DIAGNOSIS — M24542 Contracture, left hand: Secondary | ICD-10-CM | POA: Diagnosis not present

## 2021-07-17 DIAGNOSIS — E871 Hypo-osmolality and hyponatremia: Secondary | ICD-10-CM | POA: Diagnosis not present

## 2021-07-17 DIAGNOSIS — I509 Heart failure, unspecified: Secondary | ICD-10-CM | POA: Diagnosis not present

## 2021-07-17 DIAGNOSIS — E222 Syndrome of inappropriate secretion of antidiuretic hormone: Secondary | ICD-10-CM | POA: Diagnosis not present

## 2021-07-17 DIAGNOSIS — R5381 Other malaise: Secondary | ICD-10-CM | POA: Diagnosis not present

## 2021-07-23 DIAGNOSIS — M79631 Pain in right forearm: Secondary | ICD-10-CM | POA: Diagnosis not present

## 2021-07-23 DIAGNOSIS — M79641 Pain in right hand: Secondary | ICD-10-CM | POA: Diagnosis not present

## 2021-07-23 DIAGNOSIS — I10 Essential (primary) hypertension: Secondary | ICD-10-CM | POA: Diagnosis not present

## 2021-07-25 DIAGNOSIS — I10 Essential (primary) hypertension: Secondary | ICD-10-CM | POA: Diagnosis not present

## 2021-07-25 DIAGNOSIS — E222 Syndrome of inappropriate secretion of antidiuretic hormone: Secondary | ICD-10-CM | POA: Diagnosis not present

## 2021-07-25 DIAGNOSIS — E871 Hypo-osmolality and hyponatremia: Secondary | ICD-10-CM | POA: Diagnosis not present

## 2021-07-27 DIAGNOSIS — G8929 Other chronic pain: Secondary | ICD-10-CM | POA: Diagnosis not present

## 2021-07-27 DIAGNOSIS — E222 Syndrome of inappropriate secretion of antidiuretic hormone: Secondary | ICD-10-CM | POA: Diagnosis not present

## 2021-07-27 DIAGNOSIS — E039 Hypothyroidism, unspecified: Secondary | ICD-10-CM | POA: Diagnosis not present

## 2021-07-27 DIAGNOSIS — F339 Major depressive disorder, recurrent, unspecified: Secondary | ICD-10-CM | POA: Diagnosis not present

## 2021-08-01 DIAGNOSIS — R7989 Other specified abnormal findings of blood chemistry: Secondary | ICD-10-CM | POA: Diagnosis not present

## 2021-08-01 DIAGNOSIS — I10 Essential (primary) hypertension: Secondary | ICD-10-CM | POA: Diagnosis not present

## 2021-08-02 DIAGNOSIS — K5900 Constipation, unspecified: Secondary | ICD-10-CM | POA: Diagnosis not present

## 2021-08-07 DIAGNOSIS — L662 Folliculitis decalvans: Secondary | ICD-10-CM | POA: Diagnosis not present

## 2021-08-31 DIAGNOSIS — I69354 Hemiplegia and hemiparesis following cerebral infarction affecting left non-dominant side: Secondary | ICD-10-CM | POA: Diagnosis not present

## 2021-08-31 DIAGNOSIS — B379 Candidiasis, unspecified: Secondary | ICD-10-CM | POA: Diagnosis not present

## 2021-08-31 DIAGNOSIS — G8929 Other chronic pain: Secondary | ICD-10-CM | POA: Diagnosis not present

## 2021-09-13 DIAGNOSIS — I110 Hypertensive heart disease with heart failure: Secondary | ICD-10-CM | POA: Diagnosis not present

## 2021-09-13 DIAGNOSIS — F339 Major depressive disorder, recurrent, unspecified: Secondary | ICD-10-CM | POA: Diagnosis not present

## 2021-09-13 DIAGNOSIS — I509 Heart failure, unspecified: Secondary | ICD-10-CM | POA: Diagnosis not present

## 2021-09-13 DIAGNOSIS — I69354 Hemiplegia and hemiparesis following cerebral infarction affecting left non-dominant side: Secondary | ICD-10-CM | POA: Diagnosis not present

## 2021-09-21 DIAGNOSIS — L603 Nail dystrophy: Secondary | ICD-10-CM | POA: Diagnosis not present

## 2021-09-21 DIAGNOSIS — M2042 Other hammer toe(s) (acquired), left foot: Secondary | ICD-10-CM | POA: Diagnosis not present

## 2021-09-21 DIAGNOSIS — I739 Peripheral vascular disease, unspecified: Secondary | ICD-10-CM | POA: Diagnosis not present

## 2021-09-21 DIAGNOSIS — B351 Tinea unguium: Secondary | ICD-10-CM | POA: Diagnosis not present

## 2021-09-21 DIAGNOSIS — M2041 Other hammer toe(s) (acquired), right foot: Secondary | ICD-10-CM | POA: Diagnosis not present

## 2021-09-27 DIAGNOSIS — R2689 Other abnormalities of gait and mobility: Secondary | ICD-10-CM | POA: Diagnosis not present

## 2021-09-27 DIAGNOSIS — R262 Difficulty in walking, not elsewhere classified: Secondary | ICD-10-CM | POA: Diagnosis not present

## 2021-10-09 DIAGNOSIS — R262 Difficulty in walking, not elsewhere classified: Secondary | ICD-10-CM | POA: Diagnosis not present

## 2021-10-09 DIAGNOSIS — R2689 Other abnormalities of gait and mobility: Secondary | ICD-10-CM | POA: Diagnosis not present

## 2021-10-12 DIAGNOSIS — H04123 Dry eye syndrome of bilateral lacrimal glands: Secondary | ICD-10-CM | POA: Diagnosis not present

## 2021-10-12 DIAGNOSIS — H26493 Other secondary cataract, bilateral: Secondary | ICD-10-CM | POA: Diagnosis not present

## 2022-03-27 ENCOUNTER — Emergency Department: Payer: Medicare Other

## 2022-03-27 ENCOUNTER — Inpatient Hospital Stay
Admission: EM | Admit: 2022-03-27 | Discharge: 2022-04-01 | DRG: 202 | Disposition: A | Discharge status: Skilled Nursing Facility | Payer: Medicare Other | Source: Skilled Nursing Facility | Attending: Internal Medicine | Admitting: Internal Medicine

## 2022-03-27 ENCOUNTER — Encounter: Payer: Self-pay | Admitting: *Deleted

## 2022-03-27 ENCOUNTER — Other Ambulatory Visit: Payer: Self-pay

## 2022-03-27 DIAGNOSIS — I129 Hypertensive chronic kidney disease with stage 1 through stage 4 chronic kidney disease, or unspecified chronic kidney disease: Secondary | ICD-10-CM | POA: Diagnosis present

## 2022-03-27 DIAGNOSIS — K219 Gastro-esophageal reflux disease without esophagitis: Secondary | ICD-10-CM | POA: Diagnosis present

## 2022-03-27 DIAGNOSIS — J9601 Acute respiratory failure with hypoxia: Secondary | ICD-10-CM | POA: Diagnosis present

## 2022-03-27 DIAGNOSIS — S22019A Unspecified fracture of first thoracic vertebra, initial encounter for closed fracture: Secondary | ICD-10-CM | POA: Diagnosis present

## 2022-03-27 DIAGNOSIS — W1811XA Fall from or off toilet without subsequent striking against object, initial encounter: Secondary | ICD-10-CM | POA: Diagnosis present

## 2022-03-27 DIAGNOSIS — E039 Hypothyroidism, unspecified: Secondary | ICD-10-CM | POA: Diagnosis present

## 2022-03-27 DIAGNOSIS — Z20822 Contact with and (suspected) exposure to covid-19: Secondary | ICD-10-CM | POA: Diagnosis present

## 2022-03-27 DIAGNOSIS — E875 Hyperkalemia: Secondary | ICD-10-CM | POA: Diagnosis not present

## 2022-03-27 DIAGNOSIS — S22010A Wedge compression fracture of first thoracic vertebra, initial encounter for closed fracture: Secondary | ICD-10-CM | POA: Diagnosis not present

## 2022-03-27 DIAGNOSIS — E785 Hyperlipidemia, unspecified: Secondary | ICD-10-CM | POA: Diagnosis present

## 2022-03-27 DIAGNOSIS — W19XXXA Unspecified fall, initial encounter: Secondary | ICD-10-CM | POA: Diagnosis not present

## 2022-03-27 DIAGNOSIS — F419 Anxiety disorder, unspecified: Secondary | ICD-10-CM | POA: Diagnosis present

## 2022-03-27 DIAGNOSIS — Z90710 Acquired absence of both cervix and uterus: Secondary | ICD-10-CM

## 2022-03-27 DIAGNOSIS — Z1152 Encounter for screening for COVID-19: Secondary | ICD-10-CM

## 2022-03-27 DIAGNOSIS — R222 Localized swelling, mass and lump, trunk: Secondary | ICD-10-CM | POA: Diagnosis present

## 2022-03-27 DIAGNOSIS — I69354 Hemiplegia and hemiparesis following cerebral infarction affecting left non-dominant side: Secondary | ICD-10-CM

## 2022-03-27 DIAGNOSIS — Z66 Do not resuscitate: Secondary | ICD-10-CM | POA: Diagnosis present

## 2022-03-27 DIAGNOSIS — M542 Cervicalgia: Secondary | ICD-10-CM | POA: Diagnosis present

## 2022-03-27 DIAGNOSIS — Z91040 Latex allergy status: Secondary | ICD-10-CM

## 2022-03-27 DIAGNOSIS — Z87891 Personal history of nicotine dependence: Secondary | ICD-10-CM

## 2022-03-27 DIAGNOSIS — S0083XA Contusion of other part of head, initial encounter: Secondary | ICD-10-CM | POA: Diagnosis present

## 2022-03-27 DIAGNOSIS — J45901 Unspecified asthma with (acute) exacerbation: Principal | ICD-10-CM

## 2022-03-27 DIAGNOSIS — Z79890 Hormone replacement therapy (postmenopausal): Secondary | ICD-10-CM

## 2022-03-27 DIAGNOSIS — E278 Other specified disorders of adrenal gland: Secondary | ICD-10-CM | POA: Diagnosis present

## 2022-03-27 DIAGNOSIS — Y92121 Bathroom in nursing home as the place of occurrence of the external cause: Secondary | ICD-10-CM | POA: Diagnosis not present

## 2022-03-27 DIAGNOSIS — M1990 Unspecified osteoarthritis, unspecified site: Secondary | ICD-10-CM | POA: Diagnosis present

## 2022-03-27 DIAGNOSIS — F32A Depression, unspecified: Secondary | ICD-10-CM | POA: Diagnosis present

## 2022-03-27 DIAGNOSIS — E871 Hypo-osmolality and hyponatremia: Secondary | ICD-10-CM | POA: Diagnosis not present

## 2022-03-27 DIAGNOSIS — I10 Essential (primary) hypertension: Secondary | ICD-10-CM | POA: Diagnosis not present

## 2022-03-27 DIAGNOSIS — Z881 Allergy status to other antibiotic agents status: Secondary | ICD-10-CM

## 2022-03-27 DIAGNOSIS — G629 Polyneuropathy, unspecified: Secondary | ICD-10-CM

## 2022-03-27 DIAGNOSIS — Z800 Family history of malignant neoplasm of digestive organs: Secondary | ICD-10-CM

## 2022-03-27 DIAGNOSIS — Z823 Family history of stroke: Secondary | ICD-10-CM

## 2022-03-27 DIAGNOSIS — R296 Repeated falls: Secondary | ICD-10-CM | POA: Diagnosis present

## 2022-03-27 DIAGNOSIS — Z888 Allergy status to other drugs, medicaments and biological substances status: Secondary | ICD-10-CM

## 2022-03-27 DIAGNOSIS — Z7189 Other specified counseling: Secondary | ICD-10-CM | POA: Diagnosis not present

## 2022-03-27 DIAGNOSIS — S0093XA Contusion of unspecified part of head, initial encounter: Secondary | ICD-10-CM | POA: Insufficient documentation

## 2022-03-27 DIAGNOSIS — Z993 Dependence on wheelchair: Secondary | ICD-10-CM

## 2022-03-27 DIAGNOSIS — N182 Chronic kidney disease, stage 2 (mild): Secondary | ICD-10-CM | POA: Diagnosis present

## 2022-03-27 DIAGNOSIS — Z833 Family history of diabetes mellitus: Secondary | ICD-10-CM

## 2022-03-27 DIAGNOSIS — Z8249 Family history of ischemic heart disease and other diseases of the circulatory system: Secondary | ICD-10-CM

## 2022-03-27 DIAGNOSIS — D4989 Neoplasm of unspecified behavior of other specified sites: Secondary | ICD-10-CM

## 2022-03-27 DIAGNOSIS — E669 Obesity, unspecified: Secondary | ICD-10-CM | POA: Insufficient documentation

## 2022-03-27 DIAGNOSIS — Z6835 Body mass index (BMI) 35.0-35.9, adult: Secondary | ICD-10-CM

## 2022-03-27 DIAGNOSIS — S0510XA Contusion of eyeball and orbital tissues, unspecified eye, initial encounter: Secondary | ICD-10-CM | POA: Diagnosis not present

## 2022-03-27 DIAGNOSIS — Z79899 Other long term (current) drug therapy: Secondary | ICD-10-CM

## 2022-03-27 LAB — CBC
HCT: 39.6 % (ref 36.0–46.0)
Hemoglobin: 12.4 g/dL (ref 12.0–15.0)
MCH: 29.7 pg (ref 26.0–34.0)
MCHC: 31.3 g/dL (ref 30.0–36.0)
MCV: 95 fL (ref 80.0–100.0)
Platelets: 212 10*3/uL (ref 150–400)
RBC: 4.17 MIL/uL (ref 3.87–5.11)
RDW: 13.1 % (ref 11.5–15.5)
WBC: 9.5 10*3/uL (ref 4.0–10.5)
nRBC: 0 % (ref 0.0–0.2)

## 2022-03-27 LAB — BASIC METABOLIC PANEL
Anion gap: 11 (ref 5–15)
BUN: 25 mg/dL — ABNORMAL HIGH (ref 8–23)
CO2: 27 mmol/L (ref 22–32)
Calcium: 10 mg/dL (ref 8.9–10.3)
Chloride: 99 mmol/L (ref 98–111)
Creatinine, Ser: 1.11 mg/dL — ABNORMAL HIGH (ref 0.44–1.00)
GFR, Estimated: 50 mL/min — ABNORMAL LOW (ref >60)
Glucose, Bld: 110 mg/dL — ABNORMAL HIGH (ref 70–99)
Potassium: 4.6 mmol/L (ref 3.5–5.1)
Sodium: 137 mmol/L (ref 135–145)

## 2022-03-27 LAB — RESP PANEL BY RT-PCR (RSV, FLU A&B, COVID)  RVPGX2
Influenza A by PCR: NEGATIVE
Influenza B by PCR: NEGATIVE
Resp Syncytial Virus by PCR: NEGATIVE
SARS Coronavirus 2 by RT PCR: NEGATIVE

## 2022-03-27 LAB — TROPONIN I (HIGH SENSITIVITY)
Troponin I (High Sensitivity): 5 ng/L (ref <18)
Troponin I (High Sensitivity): 6 ng/L (ref <18)

## 2022-03-27 MED ORDER — VENLAFAXINE HCL ER 37.5 MG PO CP24
37.5000 mg | ORAL_CAPSULE | Freq: Every day | ORAL | Status: DC
  Administered 2022-03-28 – 2022-04-01 (×5): 37.5 mg via ORAL
  Filled 2022-03-27 (×5): qty 1

## 2022-03-27 MED ORDER — ONDANSETRON HCL 4 MG/2ML IJ SOLN: 4.0000 mg | Freq: Four times a day (QID) | INTRAMUSCULAR | Status: DC | PRN

## 2022-03-27 MED ORDER — HYDROCORTISONE 1 % EX CREA
1.0000 | TOPICAL_CREAM | Freq: Two times a day (BID) | CUTANEOUS | Status: DC
  Administered 2022-03-28 – 2022-03-31 (×7): 1 via TOPICAL
  Filled 2022-03-27: qty 28

## 2022-03-27 MED ORDER — ACETAMINOPHEN 500 MG PO TABS
1000.0000 mg | ORAL_TABLET | Freq: Once | ORAL | Status: AC
  Administered 2022-03-27: 1000 mg via ORAL
  Filled 2022-03-27: qty 2

## 2022-03-27 MED ORDER — ACETAMINOPHEN 325 MG PO TABS
650.0000 mg | ORAL_TABLET | Freq: Four times a day (QID) | ORAL | Status: DC | PRN
  Administered 2022-03-31 – 2022-04-01 (×4): 650 mg via ORAL
  Filled 2022-03-27 (×4): qty 2

## 2022-03-27 MED ORDER — TRIAMCINOLONE ACETONIDE 55 MCG/ACT NA AERO
1.0000 | INHALATION_SPRAY | Freq: Every day | NASAL | Status: DC
  Administered 2022-03-28 – 2022-04-01 (×5): 1 via NASAL
  Filled 2022-03-27: qty 10.8

## 2022-03-27 MED ORDER — SPIRONOLACTONE 25 MG PO TABS: 50.0000 mg | ORAL_TABLET | Freq: Every day | ORAL | Status: DC

## 2022-03-27 MED ORDER — MAGNESIUM HYDROXIDE 400 MG/5ML PO SUSP
30.0000 mL | Freq: Every day | ORAL | Status: DC | PRN
  Filled 2022-03-27: qty 30

## 2022-03-27 MED ORDER — SENNOSIDES-DOCUSATE SODIUM 8.6-50 MG PO TABS
2.0000 | ORAL_TABLET | Freq: Two times a day (BID) | ORAL | Status: DC | PRN
  Administered 2022-04-01: 2 via ORAL
  Filled 2022-03-27: qty 2

## 2022-03-27 MED ORDER — CARVEDILOL 6.25 MG PO TABS
3.1250 mg | ORAL_TABLET | Freq: Two times a day (BID) | ORAL | Status: DC
  Administered 2022-03-28 – 2022-04-01 (×9): 3.125 mg via ORAL
  Filled 2022-03-27 (×4): qty 0.5
  Filled 2022-03-27: qty 1
  Filled 2022-03-27: qty 0.5
  Filled 2022-03-27: qty 1
  Filled 2022-03-27: qty 0.5
  Filled 2022-03-27 (×2): qty 1
  Filled 2022-03-27 (×2): qty 0.5
  Filled 2022-03-27: qty 1
  Filled 2022-03-27: qty 0.5
  Filled 2022-03-27 (×3): qty 1
  Filled 2022-03-27: qty 0.5

## 2022-03-27 MED ORDER — IOHEXOL 350 MG/ML SOLN
75.0000 mL | Freq: Once | INTRAVENOUS | Status: AC | PRN
  Administered 2022-03-27: 75 mL via INTRAVENOUS

## 2022-03-27 MED ORDER — ONDANSETRON HCL 4 MG PO TABS: 4.0000 mg | ORAL_TABLET | Freq: Four times a day (QID) | ORAL | Status: DC | PRN

## 2022-03-27 MED ORDER — DIVALPROEX SODIUM 125 MG PO DR TAB
125.0000 mg | DELAYED_RELEASE_TABLET | Freq: Two times a day (BID) | ORAL | Status: DC
  Administered 2022-03-28 – 2022-04-01 (×9): 125 mg via ORAL
  Filled 2022-03-27 (×10): qty 1

## 2022-03-27 MED ORDER — GUAIFENESIN ER 600 MG PO TB12: 1200.0000 mg | ORAL_TABLET | Freq: Two times a day (BID) | ORAL | Status: DC | PRN

## 2022-03-27 MED ORDER — LEVOTHYROXINE SODIUM 50 MCG PO TABS
75.0000 ug | ORAL_TABLET | Freq: Every day | ORAL | Status: DC
  Administered 2022-03-28 – 2022-04-01 (×5): 75 ug via ORAL
  Filled 2022-03-27 (×5): qty 2

## 2022-03-27 MED ORDER — ENOXAPARIN SODIUM 60 MG/0.6ML IJ SOSY
0.5000 mg/kg | PREFILLED_SYRINGE | INTRAMUSCULAR | Status: DC
  Administered 2022-03-28 – 2022-04-01 (×5): 42.5 mg via SUBCUTANEOUS
  Filled 2022-03-27 (×5): qty 0.6

## 2022-03-27 MED ORDER — TORSEMIDE 20 MG PO TABS
10.0000 mg | ORAL_TABLET | Freq: Every day | ORAL | Status: DC
  Administered 2022-03-28 – 2022-04-01 (×5): 10 mg via ORAL
  Filled 2022-03-27 (×5): qty 1

## 2022-03-27 MED ORDER — PRAZOSIN HCL 1 MG PO CAPS
3.0000 mg | ORAL_CAPSULE | Freq: Every day | ORAL | Status: DC
  Administered 2022-03-28 – 2022-03-31 (×4): 3 mg via ORAL
  Filled 2022-03-27 (×5): qty 3

## 2022-03-27 MED ORDER — CALCIUM CARBONATE-VITAMIN D 600-400 MG-UNIT PO TABS: 1.0000 | ORAL_TABLET | Freq: Two times a day (BID) | ORAL | Status: DC

## 2022-03-27 MED ORDER — HYDROCERIN EX CREA
TOPICAL_CREAM | Freq: Two times a day (BID) | CUTANEOUS | Status: DC
  Administered 2022-03-29 – 2022-03-31 (×2): 1 via TOPICAL
  Filled 2022-03-27: qty 113

## 2022-03-27 MED ORDER — BISACODYL 5 MG PO TBEC
5.0000 mg | DELAYED_RELEASE_TABLET | ORAL | Status: DC
  Administered 2022-03-29 – 2022-03-31 (×2): 5 mg via ORAL
  Filled 2022-03-27 (×2): qty 1

## 2022-03-27 MED ORDER — DEMECLOCYCLINE HCL 150 MG PO TABS
300.0000 mg | ORAL_TABLET | Freq: Two times a day (BID) | ORAL | Status: DC
  Administered 2022-03-28 – 2022-03-31 (×7): 300 mg via ORAL
  Filled 2022-03-27 (×7): qty 2

## 2022-03-27 MED ORDER — NYSTATIN 100000 UNIT/GM EX POWD
Freq: Two times a day (BID) | CUTANEOUS | Status: DC
  Filled 2022-03-27 (×2): qty 15

## 2022-03-27 MED ORDER — POLYETHYLENE GLYCOL 3350 17 G PO PACK: 17.0000 g | PACK | Freq: Every day | ORAL | Status: DC

## 2022-03-27 MED ORDER — ALUM & MAG HYDROXIDE-SIMETH 200-200-20 MG/5ML PO SUSP: 30.0000 mL | ORAL | Status: DC | PRN

## 2022-03-27 MED ORDER — SODIUM CHLORIDE 1 G PO TABS
1.0000 g | ORAL_TABLET | Freq: Three times a day (TID) | ORAL | Status: DC
  Administered 2022-03-28 – 2022-04-01 (×15): 1 g via ORAL
  Filled 2022-03-27 (×15): qty 1

## 2022-03-27 MED ORDER — MONTELUKAST SODIUM 10 MG PO TABS
10.0000 mg | ORAL_TABLET | Freq: Every day | ORAL | Status: DC
  Administered 2022-03-28 – 2022-03-31 (×3): 10 mg via ORAL
  Filled 2022-03-27 (×3): qty 1

## 2022-03-27 MED ORDER — PANTOPRAZOLE SODIUM 40 MG PO TBEC
40.0000 mg | DELAYED_RELEASE_TABLET | Freq: Every day | ORAL | Status: DC
  Administered 2022-03-28 – 2022-04-01 (×5): 40 mg via ORAL
  Filled 2022-03-27 (×5): qty 1

## 2022-03-27 MED ORDER — ACETAMINOPHEN 650 MG RE SUPP: 650.0000 mg | Freq: Four times a day (QID) | RECTAL | Status: DC | PRN

## 2022-03-27 MED ORDER — IPRATROPIUM-ALBUTEROL 0.5-2.5 (3) MG/3ML IN SOLN: 3.0000 mL | Freq: Four times a day (QID) | RESPIRATORY_TRACT | Status: DC | PRN

## 2022-03-27 MED ORDER — TRAZODONE HCL 50 MG PO TABS
25.0000 mg | ORAL_TABLET | Freq: Every evening | ORAL | Status: DC | PRN
  Administered 2022-03-28 – 2022-03-31 (×2): 25 mg via ORAL
  Filled 2022-03-27 (×2): qty 1

## 2022-03-27 MED ORDER — VITAMIN D 25 MCG (1000 UNIT) PO TABS
5000.0000 [IU] | ORAL_TABLET | Freq: Every day | ORAL | Status: DC
  Administered 2022-03-28 – 2022-04-01 (×5): 5000 [IU] via ORAL
  Filled 2022-03-27 (×5): qty 5

## 2022-03-27 NOTE — Assessment & Plan Note (Signed)
-   This is likely secondary to acute asthma exacerbation. - The patient will be admitted to a medical telemetry bed. - O2 protocol will be followed. - We will place her on scheduled and as needed DuoNebs as well as scheduled IV steroid therapy with Solu-Medrol. - We will continue her Singulair. - It is unclear if this is the only the cause of her hypoxia but it I doubt that it is related to her 2.8X 3.7 cm thymoma.  She has a stable left thyroid mass as well. - She declined surgical consultation at this time pending management of her hypoxia and suspected asthma exacerbation.

## 2022-03-27 NOTE — Progress Notes (Signed)
PHARMACIST - PHYSICIAN COMMUNICATION  CONCERNING:  Enoxaparin (Lovenox) for DVT Prophylaxis    RECOMMENDATION: Patient was prescribed enoxaprin 40mg  q24 hours for VTE prophylaxis.   Filed Weights   03/27/22 2038  Weight: 85 kg (187 lb 6.3 oz)    Body mass index is 35.41 kg/m.  Estimated Creatinine Clearance: 38.7 mL/min (A) (by C-G formula based on SCr of 1.11 mg/dL (H)).   Based on [place] patient is candidate for enoxaparin 0.5mg /kg TBW SQ every 24 hours based on BMI being >30.  DESCRIPTION: Pharmacy has adjusted enoxaparin dose per [facility] policy.  Patient is now receiving enoxaparin 0.5 mg/kg every 24 hours   Renda Rolls, PharmD, [facility] 03/27/2022 10:55 PM

## 2022-03-27 NOTE — ED Provider Notes (Signed)
[facility] Provider Note    Event Date/Time   First MD Initiated Contact with Patient 03/27/22 2155     (approximate)   History   No chief complaint on file.   HPI  [name] is a 81 y.o. female who [facility] after falling off the commode hitting her head on the corner of the wall.  Denies any LOC.  Patient had to be placed on 4 L of nasal cannula due to low oxygen sats.  In triage her saturations were 85%.  She does report having a chronic cough but does not really state that is any worse than normal.  She denies being on oxygen at baseline.  She is not really sure if she has had any shortness of breath.  She does report that there has been some COVID in the facility but she reports having a negative test this morning.  She denies any new swelling.  She does have baseline left-sided weakness from prior stroke and therefore is wheelchair-bound at baseline.  She denies any lower back pain but she reports some neck pain after the fall.   Physical Exam   Triage Vital Signs: ED Triage Vitals  Enc Vitals Group     BP 03/27/22 2037 (!) 164/91     Pulse Rate 03/27/22 2037 (!) 105     Resp 03/27/22 2037 (!) 22     Temp 03/27/22 2037 98.4 F (36.9 C)     Temp Source 03/27/22 2037 Oral     SpO2 03/27/22 2031 98 %     Weight 03/27/22 2038 187 lb 6.3 oz (85 kg)     Height 03/27/22 2038 5\' 1"  (1.549 m)     Head Circumference --      Peak Flow --      Pain Score 03/27/22 2038 7     Pain Loc --      Pain Edu? --      Excl. in [place]? --     Most recent vital signs: Vitals:   03/27/22 2037 03/27/22 2150  BP: (!) 164/91 (!) 165/85  Pulse: (!) 105 100  Resp: (!) 22 (!) 31  Temp: 98.4 F (36.9 C)   SpO2: 96% 98%     General: Awake, no distress.  CV:  Good peripheral perfusion.  Resp:  Normal effort.  Clear lungs Abd:  No distention.  Soft nontender Other:  Patient has amputation of the left lower leg and she has got a contracture  noted of her left arm.  She is able to lift up both legs and squeeze her hands bilaterally. Hematoma to the forehead  ED Results / Procedures / Treatments   Labs (all labs ordered are listed, but only abnormal results are displayed) Labs Reviewed  BASIC METABOLIC PANEL - Abnormal; Notable for the following components:      Result Value   Glucose, Bld 110 (*)    BUN 25 (*)    Creatinine, Ser 1.11 (*)    GFR, Estimated 50 (*)    All other components within normal limits  CBC  TROPONIN I (HIGH SENSITIVITY)     EKG  My interpretation of EKG:  Sinus tachycardia rate of 107 without any ST elevation or T wave inversions, normal intervals  RADIOLOGY I have reviewed the xray personally and interpreted and no pneumonia PROCEDURES:  Critical Care performed: Yes, see critical care procedure note(s)  .1-3 Lead EKG Interpretation  Performed by: Vanessa Pen[place],  MD Authorized by: Vanessa [place], MD     Interpretation: normal     ECG rate:  100   ECG rate assessment: tachycardic     Rhythm: sinus tachycardia     Ectopy: none     Conduction: normal   .Critical Care  Performed by: Vanessa [place], MD Authorized by: Vanessa [place], MD   Critical care provider statement:    Critical care time (minutes):  30   Critical care was necessary to treat or prevent imminent or life-threatening deterioration of the following conditions:  Respiratory failure   Critical care was time spent personally by me on the following activities:  Development of treatment plan with patient or surrogate, discussions with consultants, evaluation of patient's response to treatment, examination of patient, ordering and review of laboratory studies, ordering and review of radiographic studies, ordering and performing treatments and interventions, pulse oximetry, re-evaluation of patient's condition and review of old charts    [place] ED: Medications - No data to display   IMPRESSION / MDM /  [place] / ED COURSE  I reviewed the triage vital signs and the nursing notes.   Patient's presentation is most consistent with acute presentation with potential threat to life or bodily function.   Patient comes in with a fall, hypoxic in triage to 85% placed on 4 L.  I did attempt to take her off the oxygen and she desatted down again to 88%.  Patient had workup done to evaluate for intracranial hemorrhage, fracture, pneumonia.  Troponin was negative.  BMP slightly elevated creatinine.  CBC reassuring X-ray of knee negative Chest x-ray negative Forearm x-ray negative  CT head and neck show a T1 compression fracture subacute to chronic remote T2 compression fracture CT head with soft tissue hematoma  I will discuss hospital team for admission given hypoxic respiratory failure CT and COVID test are pending.   The patient is on the cardiac monitor to evaluate for evidence of arrhythmia and/or significant heart rate changes.      FINAL CLINICAL IMPRESSION(S) / ED DIAGNOSES   Final diagnoses:  Acute respiratory failure with hypoxia ([place])  Fall, initial encounter     Rx / DC Orders   ED Discharge Orders     None        Note:  This document was prepared using Dragon voice recognition software and may include unintentional dictation errors.   Vanessa [place], MD 03/27/22 [phone]

## 2022-03-27 NOTE — ED Triage Notes (Addendum)
EMS brings pt in from [place] after losing balance & falling off commode; hit head on corner of wall; hematoma to top of scalp noted, no LOC; also reports some pain to left FA

## 2022-03-27 NOTE — H&P (Signed)
[place]   PATIENT NAME: [name]    MR#:  [id number]  DATE OF BIRTH:  11-01-1940  DATE OF ADMISSION:  03/27/2022  PRIMARY CARE PHYSICIAN: Alvester Morin, MD   Patient is coming from: [facility].  REQUESTING/REFERRING PHYSICIAN: Marjean Donna, MD  CHIEF COMPLAINT:  Fall  HISTORY OF PRESENT ILLNESS:  [name] is a 82 y.o. Caucasian female with medical history significant for asthma, GERD, depression, and anxiety, hypertension, dyslipidemia, osteoarthritis and CVA, who presented to the emergency room with acute onset of fall off of the commode at her SNF.  She hit her head on the corner of the wall but denied any presyncope or syncope.  No paresthesias or new focal muscle weakness.  She was noted to be hypoxic and was placed on 4 L O2 nasal cannula.  She was 85% and here on room air..  She reports chronic cough that was not worse than normal lately.  She admitted to mild dyspnea this afternoon as well as wheezing.  She is not on home O2.  There have been cases of COVID-19 in her SNF.  No lower back pain but she reported neck pain after the fall.  ED Course: When the patient came to the ER, BP was 164/91 and heart rate 105, respiratory rate was 22 and pulse oximetry was 96% on 4 L of O2 by nasal cannula.  Labs revealed a BUN of 25 with a creatinine of 1.11 and CBC was within normal.  Influenza, RSV and COVID PCR's came back negative.  EKG as reviewed by me : Sinus tachycardia with a rate of 107 with poor R wave progression. Imaging: Chest x-ray showed mild cardiomegaly and linear atelectasis or scar at the left base.  Left forearm x-ray showed no acute findings.  4 view knee x-ray was negative except for mild arthritic changes. Noncontrasted head CT scan revealed the following: 1. Marked severity frontal scalp soft tissue swelling with an associated large, lobulated scalp soft tissue hematoma. 2. No acute intracranial abnormality. 3. Small, chronic  right basal ganglia lacunar infarct. 4. Generalized cerebral atrophy and microvascular disease changes of the supratentorial brain.  C-spine CT showed the following: . T1 compression fracture which could be subacute to chronic. Remote T2 compression fracture. 2. Negative for cervical spine fracture or subluxation.  Chest CTA showed the following: 1. No pulmonary embolism. 2. Mild multi-vessel coronary artery calcification. Stable mild global cardiomegaly. Stable small pericardial effusion. 3. Bronchomalacia involving the mainstem bronchi bilaterally and bronchus intermedius. 4. Interval enlargement of a anterior mediastinal mass most in keeping with a thymoma or lymphoma. Surgical consultation may be helpful for further management. 5. Stable 4.6 cm left thyroid mass, not optimally assessed but likely benign given its stability over time. This was previously assessed on sonogram of 11/24/2014. In the setting of significant clinical comorbidity or limited life expectancy, no follow-up imaging is recommended. 6. Moderate hiatal hernia. 7. Stable 2.4 cm left adrenal adenoma. 8. Interval development of an age indeterminate compression deformity of T1 with approximately 30-40% loss of height. Correlation for point tenderness is recommended. If indicated, this could be better assessed with MRI examination. 9. Aortic Atherosclerosis.  The patient was given 1 g of p.o. Tylenol.  She will be admitted to a medical telemetry bed for further evaluation and management. PAST MEDICAL HISTORY:   Past Medical History:  Diagnosis Date   Arthritis    Asthma    Contracture of joint of finger  of left hand    Depression with anxiety    GERD (gastroesophageal reflux disease)    Hemiparesis affecting left side as late effect of cerebrovascular accident (CVA) ([place])    Hot flashes    Hyperlipidemia    Hypertension    Muscle hypertonicity    Osteoarthritis    Stroke ([place])    TIA (transient  ischemic attack)     PAST SURGICAL HISTORY:   Past Surgical History:  Procedure Laterality Date   ACHILLES TENDON LENGTHENING  08/25/2015   Procedure: ACHILLES TENDON LENGTHENING;  Surgeon: Samara Deist, DPM;  Location: [facility] ORS;  Service: Podiatry;;   APPENDECTOMY     COLONOSCOPY  06/17/2008   EYE SURGERY Bilateral    Cataract Extraction with IOL   FLAT FOOT RECONSTRUCTION-TAL GASTROC RECESSION Left 08/25/2015   Procedure: ENDOSCOPIC FLAT FOOT RECONSTRUCTION-TAL GASTROC RECESSION;  Surgeon: Samara Deist, DPM;  Location: [facility] ORS;  Service: Podiatry;  Laterality: Left;   VAGINAL HYSTERECTOMY      SOCIAL HISTORY:   Social History   Tobacco Use   Smoking status: Former    Packs/day: 0.50    Years: 3.00    Additional pack years: 0.00    Total pack years: 1.50    Types: Cigarettes    Quit date: 01/08/1963    Years since quitting: 59.2   Smokeless tobacco: Never  Substance Use Topics   Alcohol use: No    Alcohol/week: 0.0 standard drinks of alcohol    Comment: does not drink alcohol    FAMILY HISTORY:   Family History  Problem Relation Age of Onset   Alcohol abuse Mother    Coronary artery disease Mother    Stroke Mother    Liver cancer Father    Alcohol abuse Brother    Coronary artery disease Brother    Diabetes type II Brother    Liver cancer Brother     DRUG ALLERGIES:   Allergies  Allergen Reactions   Tizanidine Other (See Comments)    HYPOTENSION AND UNRESPONSIVENESS   Ace Inhibitors Cough   Latex Hives   Lisinopril Cough   Tetracycline Other (See Comments)     REACTION: yeast   Zanaflex [Tizanidine Hcl] Other (See Comments)    "drowsy"    REVIEW OF SYSTEMS:   ROS As per history of present illness. All pertinent systems were reviewed above. Constitutional, HEENT, cardiovascular, respiratory, GI, GU, musculoskeletal, neuro, psychiatric, endocrine, integumentary and hematologic systems were reviewed and are otherwise negative/unremarkable except for  positive findings mentioned above in the HPI.   MEDICATIONS AT HOME:   Prior to Admission medications   Medication Sig Start Date End Date Taking? Authorizing Provider  acetaminophen (TYLENOL) 500 MG tablet Take 1,000 mg by mouth every 6 (six) hours as needed (osteoarthritis of left shoulder).    [provider]  alum & mag hydroxide-simeth (MAALOX PLUS) 400-400-40 MG/5ML suspension Take 30 mLs by mouth every 4 (four) hours as needed for indigestion.     [provider]  bisacodyl (DULCOLAX) 5 MG EC tablet Take 5 mg by mouth every other day.    [provider]  Calcium Carbonate-Vitamin D 600-400 MG-UNIT tablet Take 1 tablet by mouth 2 (two) times daily.    [provider]  carvedilol (COREG) 3.125 MG tablet Take 3.125 mg by mouth 2 (two) times daily.     [provider]  Cholecalciferol (VITAMIN D3) 125 MCG (5000 UT) CAPS Take 5,000 Units by mouth daily. 05/25/21   [provider]  demeclocycline (DECLOMYCIN) 300 MG tablet Take 300 mg by mouth 2 (two) times daily.     [provider]  dipyridamole-aspirin (AGGRENOX) 200-25 MG 12hr capsule Take 1 capsule by mouth every 12 (twelve) hours.     [provider]  divalproex (DEPAKOTE) 125 MG DR tablet Take 125 mg by mouth 2 (two) times daily. 02/15/20   [provider]  EUCERIN ITCH RELIEF 0.1 % LOTN Apply topically 2 (two) times daily. To dry area on left breast 02/26/21   [provider]  gabapentin (NEURONTIN) 800 MG tablet Take 800 mg by mouth every 8 (eight) hours. 01/12/18   [provider]  guaiFENesin (MUCINEX) 600 MG 12 hr tablet Take 2 tablets (1,200 mg total) by mouth 2 (two) times daily as needed. Home med. 07/15/21   Enzo Bi, MD  hydrocortisone cream 1 % Apply 1 Application topically 2 (two) times daily. hemorrhoids    [provider]  ipratropium-albuterol (DUONEB) 0.5-2.5 (3) MG/3ML SOLN Take 3 mLs by nebulization every 6 (six) hours as  needed.  12/06/17   [provider]  levothyroxine (SYNTHROID, LEVOTHROID) 75 MCG tablet Take 75 mcg by mouth daily before breakfast.  11/29/17   [provider]  montelukast (SINGULAIR) 10 MG tablet Take 10 mg by mouth at bedtime.     [provider]  NYSTATIN powder Apply topically 2 (two) times daily. 06/19/21   [provider]  omeprazole (PRILOSEC) 20 MG capsule Take 20 mg by mouth 2 (two) times daily.     [provider]  OXYGEN O2 as needed.  Titrate to keep sat > / = 92 01/22/18   [provider]  polyethylene glycol (MIRALAX / GLYCOLAX) packet Take 17 g by mouth daily. dx: constipation  Hold if loose stool    [provider]  prazosin (MINIPRESS) 1 MG capsule Take 3 mg by mouth at bedtime. 3 capsules    [provider]  sennosides-docusate sodium (SENOKOT-S) 8.6-50 MG tablet Take 2 tablets by mouth 2 (two) times daily as needed for constipation. Also take 1 tab by mouth at bedtime daily. 10/31/17   [provider]  Skin Protectants, Misc. (ENDIT EX) Apply liberal amount topically to areas of skin irritation as needed. Ok to leave at bedside.    [provider]  sodium chloride 1 g tablet Take 1 tablet (1 g total) by mouth 3 (three) times daily with meals. 07/15/21   Enzo Bi, MD  spironolactone (ALDACTONE) 50 MG tablet Take 50 mg by mouth daily.    [provider]  THERATEARS 0.25 % SOLN Apply to eye. 02/10/20   [provider]  torsemide (DEMADEX) 10 MG tablet Take 10 mg by mouth daily.     [provider]  triamcinolone (NASACORT) 55 MCG/ACT AERO nasal inhaler Place 1 spray into the nose daily.    [provider]  UNABLE TO FIND Diet Type: Regular. No Sodium Restriction. Gatorade on lunch and supper trays for hyponatremia    [provider]  venlafaxine XR (EFFEXOR-XR) 37.5 MG 24 hr capsule Take 37.5 mg by mouth daily. 07/12/21   [provider]       VITAL SIGNS:  Blood pressure (!) 183/90, pulse (!) 110, temperature 98.2 F (36.8 C), resp. rate 18, height 5\' 1"  (1.549 m), weight 85 kg, SpO2 96 %.  PHYSICAL EXAMINATION:  Physical Exam  GENERAL:  82 y.o.-year-old Caucasian female patient lying in the bed with no acute distress.  EYES: Pupils  equal, round, reactive to light and accommodation. No scleral icterus. Extraocular muscles intact.  HEENT: Head atraumatic, normocephalic. Oropharynx and nasopharynx clear.  NECK:  Supple, no jugular venous distention. No thyroid enlargement, no tenderness.  LUNGS: Diminished expiratory airflow with harsh vesicular breathing and residual expiratory wheezes.  No use of accessory muscles of respiration.  CARDIOVASCULAR: Regular rate and rhythm, S1, S2 normal. No murmurs, rubs, or gallops.  ABDOMEN: Soft, nondistended, nontender. Bowel sounds present. No organomegaly or mass.  EXTREMITIES: No pedal edema, cyanosis, or clubbing.  NEUROLOGIC: Cranial nerves II through XII are intact. Muscle strength 5/5 in all extremities. Sensation intact. Gait not checked.  PSYCHIATRIC: The patient is alert and oriented x 3.  Normal affect and good eye contact. SKIN: No obvious rash, lesion, or ulcer.   LABORATORY PANEL:   CBC Recent Labs  Lab 03/27/22 2044  WBC 9.5  HGB 12.4  HCT 39.6  PLT 212   ------------------------------------------------------------------------------------------------------------------  Chemistries  Recent Labs  Lab 03/27/22 2044  NA 137  K 4.6  CL 99  CO2 27  GLUCOSE 110*  BUN 25*  CREATININE 1.11*  CALCIUM 10.0   ------------------------------------------------------------------------------------------------------------------  Cardiac Enzymes No results for input(s): "TROPONINI" in the last 168 hours. ------------------------------------------------------------------------------------------------------------------  RADIOLOGY:  CT Angio Chest PE W and/or Wo  Contrast  Result Date: 03/27/2022 CLINICAL DATA:  Pulmonary embolism (PE) suspected, high prob. Chest pain EXAM: CT ANGIOGRAPHY CHEST WITH CONTRAST TECHNIQUE: Multidetector CT imaging of the chest was performed using the standard protocol during bolus administration of intravenous contrast. Multiplanar CT image reconstructions and MIPs were obtained to evaluate the vascular anatomy. RADIATION DOSE REDUCTION: This exam was performed according to the departmental dose-optimization program which includes automated exposure control, adjustment of the mA and/or kV according to patient size and/or use of iterative reconstruction technique. CONTRAST:  48mL OMNIPAQUE IOHEXOL 350 MG/ML SOLN COMPARISON:  11/23/2014 FINDINGS: Cardiovascular: There is adequate opacification of the pulmonary arterial tree. No intraluminal filling defect identified through the segmental level to suggest acute pulmonary embolism. The central pulmonary arteries are of normal caliber. Mild multi-vessel coronary artery calcification. Mild global cardiomegaly, stable. Small pericardial effusion is unchanged. Mild atherosclerotic calcification within the thoracic aorta. No aortic aneurysm. Mediastinum/Nodes: 4.4 x 4.6 cm left thyroid mass appears stable since prior examination, not optimally assessed but likely benign given its stability over time. This was previously assessed on sonogram of 11/24/2014. In the setting of significant comorbidities or limited life expectancy, no follow-up recommended (ref: J Am Coll Radiol. 2015 Feb;12(2): 143-50).Anterior mediastinal mass has enlarged in the interval since prior examination measuring 2.8 x 3.7 cm at axial image # 55/4, previously measuring 0.7 x 1.2 cm, most in keeping with a thymoma or lymphoma. Stable mass effect upon the proximal esophagus by the thyroid mass. The esophagus is otherwise unremarkable. Moderate hiatal hernia. Lungs/Pleura: Bilateral dependent atelectasis. Ground-glass opacities  within the lung apices likely represent atelectasis in this extra tori phase examination. There is marked narrowing of the mainstem bronchi and bronchus intermedius in the AP dimension in keeping with changes of bronchomalacia. No pneumothorax or pleural effusion. Upper Abdomen: Stable 2.4 cm left adrenal adenoma. No follow-up imaging is recommended for this lesion. No acute abnormality within the visualized abdomen. Musculoskeletal: Interval development of an age indeterminate compression deformity of T1 with approximately 30-40% loss of height. Stable anterior wedge compression deformity of T2. Healed mid sternal fracture noted, new from prior examination. Osseous structures are otherwise age-appropriate. Review of the MIP images confirms the above  findings. IMPRESSION: 1. No pulmonary embolism. 2. Mild multi-vessel coronary artery calcification. Stable mild global cardiomegaly. Stable small pericardial effusion. 3. Bronchomalacia involving the mainstem bronchi bilaterally and bronchus intermedius. 4. Interval enlargement of a anterior mediastinal mass most in keeping with a thymoma or lymphoma. Surgical consultation may be helpful for further management. 5. Stable 4.6 cm left thyroid mass, not optimally assessed but likely benign given its stability over time. This was previously assessed on sonogram of 11/24/2014. In the setting of significant clinical comorbidity or limited life expectancy, no follow-up imaging is recommended. 6. Moderate hiatal hernia. 7. Stable 2.4 cm left adrenal adenoma. 8. Interval development of an age indeterminate compression deformity of T1 with approximately 30-40% loss of height. Correlation for point tenderness is recommended. If indicated, this could be better assessed with MRI examination. Aortic Atherosclerosis (ICD10-I70.0). Electronically Signed   By: Fidela Salisbury M.D.   On: 03/27/2022 22:58   DG Knee Complete 4 Views Right  Result Date: 03/27/2022 CLINICAL DATA:  Fall  and trauma to the right knee. EXAM: RIGHT KNEE - COMPLETE 4+ VIEW COMPARISON:  None Available. FINDINGS: No acute fracture or dislocation. The bones are osteopenic. Mild arthritic changes and tricompartmental narrowing. No significant joint effusion. The soft tissues are unremarkable. IMPRESSION: 1. No acute fracture or dislocation. 2. Mild arthritic changes. Electronically Signed   By: Anner Crete M.D.   On: 03/27/2022 21:29   DG Knee Complete 4 Views Left  Result Date: 03/27/2022 CLINICAL DATA:  Fall with bilateral knee pain. EXAM: LEFT KNEE - COMPLETE 4 VIEW COMPARISON:  None Available. FINDINGS: No acute fracture or subluxation. A small joint effusion is possible. Generalized osteopenia. Undulating appearance of the lateral femoral condyle which appears chronic on the frontal view. Generalized osteopenia. IMPRESSION: No acute finding. Electronically Signed   By: Jorje Guild M.D.   On: 03/27/2022 21:29   DG Chest 1 View  Result Date: 03/27/2022 CLINICAL DATA:  Chest pain EXAM: CHEST  1 VIEW COMPARISON:  07/14/2021 FINDINGS: Right diaphragm appears slightly elevated. Linear atelectasis or scar at the left base. Mild cardiomegaly with aortic atherosclerosis. No pleural effusion or pneumothorax. IMPRESSION: Mild cardiomegaly. Linear atelectasis or scar at the left base. Electronically Signed   By: Donavan Foil M.D.   On: 03/27/2022 21:28   DG Forearm Left  Result Date: 03/27/2022 CLINICAL DATA:  Contracture.  Fall. EXAM: LEFT FOREARM - 2 VIEW COMPARISON:  None Available. FINDINGS: Wrist and hand positioning correlating with history of contracture. Generalized osteopenia. No acute fracture or dislocation. IMPRESSION: No acute finding. Electronically Signed   By: Jorje Guild M.D.   On: 03/27/2022 21:27   CT Cervical Spine Wo Contrast  Result Date: 03/27/2022 CLINICAL DATA:  Blunt facial trauma. EXAM: CT CERVICAL SPINE WITHOUT CONTRAST TECHNIQUE: Multidetector CT imaging of the cervical  spine was performed without intravenous contrast. Multiplanar CT image reconstructions were also generated. RADIATION DOSE REDUCTION: This exam was performed according to the departmental dose-optimization program which includes automated exposure control, adjustment of the mA and/or kV according to patient size and/or use of iterative reconstruction technique. COMPARISON:  None Available. FINDINGS: Alignment: Exaggerated kyphosis at the cervicothoracic junction. Skull base and vertebrae: T1 body fracture with horizontal fracture plane and impaction. Trace retropulsion. The fracture could be subacute given the indistinct margins. Remote T2 compression fracture with similar mild to moderate height loss. No acute cervical spine fracture. Soft tissues and spinal canal: Nodule in the left lobe thyroid measuring at least 5.2 cm. This was  evaluated by ultrasound 11/24/2014 with biopsy recommended. No worrisome growth given the long interval. No follow-up recommended unless clinically warranted (ref: J Am Coll Radiol. 2015 Feb;12(2): 143-50). Disc levels:  Ordinary degenerative changes Upper chest: No acute finding. IMPRESSION: 1. T1 compression fracture which could be subacute to chronic. Remote T2 compression fracture. 2. Negative for cervical spine fracture or subluxation. Electronically Signed   By: Jorje Guild M.D.   On: 03/27/2022 21:19   CT Head Wo Contrast  Result Date: 03/27/2022 CLINICAL DATA:  Status post trauma. EXAM: CT HEAD WITHOUT CONTRAST TECHNIQUE: Contiguous axial images were obtained from the base of the skull through the vertex without intravenous contrast. RADIATION DOSE REDUCTION: This exam was performed according to the departmental dose-optimization program which includes automated exposure control, adjustment of the mA and/or kV according to patient size and/or use of iterative reconstruction technique. COMPARISON:  November 26, 2014 FINDINGS: Brain: There is moderate severity cerebral  atrophy with widening of the extra-axial spaces and ventricular dilatation. There are areas of decreased attenuation within the white matter tracts of the supratentorial brain, consistent with microvascular disease changes. A small, chronic right basal ganglia lacunar infarct is noted. Vascular: No hyperdense vessel or unexpected calcification. Skull: Normal. Negative for fracture or focal lesion. Sinuses/Orbits: No acute finding. Other: Marked severity bifrontal scalp soft tissue swelling is seen along the midline, left greater than right. This extends along the vertex. An associated large, lobulated scalp soft tissue hematoma is noted IMPRESSION: 1. Marked severity frontal scalp soft tissue swelling with an associated large, lobulated scalp soft tissue hematoma. 2. No acute intracranial abnormality. 3. Small, chronic right basal ganglia lacunar infarct. 4. Generalized cerebral atrophy and microvascular disease changes of the supratentorial brain. Electronically Signed   By: Virgina Norfolk M.D.   On: 03/27/2022 21:09      IMPRESSION AND PLAN:  Assessment and Plan: * Acute respiratory failure with hypoxia ([place]) - This is likely secondary to acute asthma exacerbation. - The patient will be admitted to a medical telemetry bed. - O2 protocol will be followed. - We will place her on scheduled and as needed DuoNebs as well as scheduled IV steroid therapy with Solu-Medrol. - We will continue her Singulair. - It is unclear if this is the only the cause of her hypoxia but it I doubt that it is related to her 2.8X 3.7 cm thymoma.  She has a stable left thyroid mass as well. - She declined surgical consultation at this time pending management of her hypoxia and suspected asthma exacerbation.  Fall at home, initial encounter - This could be an accidental mechanical fall at her SNF. - Could be partly contributed to by her hypoxia. - PT consult to be obtained. - Management otherwise as  above.  Hypothyroidism - We will continue Synthroid.  Essential hypertension, benign - We will continue her antihypertensives.  Gastroesophageal reflux disease without esophagitis - We will continue PPI therapy.  Peripheral neuropathy - We will continue her Neurontin.       DVT prophylaxis: Lovenox. Advanced Care Planning:  Code Status: full code. Family Communication:  The plan of care was discussed in details with the patient (and family). I answered all questions. The patient agreed to proceed with the above mentioned plan. Further management will depend upon hospital course. Disposition Plan: Back to previous home environment Consults called: none. All the records are reviewed and case discussed with ED provider.  Status is: Inpatient   At the time of the admission, it  appears that the appropriate admission status for this patient is inpatient.  This is judged to be reasonable and necessary in order to provide the required intensity of service to ensure the patient's safety given the presenting symptoms, physical exam findings and initial radiographic and laboratory data in the context of comorbid conditions.  The patient requires inpatient status due to high intensity of service, high risk of further deterioration and high frequency of surveillance required.  I certify that at the time of admission, it is my clinical judgment that the patient will require inpatient hospital care extending more than 2 midnights.                            Dispo: The patient is from: [facility]              Anticipated d/c is to: [facility]              Patient currently is not medically stable to d/c.              Difficult to place patient: No  Christel Mormon M.D on 03/28/2022 at 1:17 AM  [facility]   From 7 PM-7 AM, contact night-coverage [email or web address]  CC: Primary care physician; Alvester Morin, MD

## 2022-03-27 NOTE — H&P (Incomplete)
[place]   PATIENT NAME: [name]    MR#:  [id number]  DATE OF BIRTH:  08-24-1940  DATE OF ADMISSION:  03/27/2022  PRIMARY CARE PHYSICIAN: Alvester Morin, MD   Patient is coming from: [facility].  REQUESTING/REFERRING PHYSICIAN: Marjean Donna, MD  CHIEF COMPLAINT:  Fall  HISTORY OF PRESENT ILLNESS:  [name] is a 82 y.o. Caucasian female with medical history significant for asthma, GERD, depression, and anxiety, hypertension, dyslipidemia, osteoarthritis and CVA, who presented to the emergency room with acute onset of fall off of the commode at her SNF.  She hit her head on the corner of the wall but denied any presyncope or syncope.  No paresthesias or new focal muscle weakness.  She was noted to be hypoxic and was placed on 4 L O2 nasal cannula.  She was 85% and here on room air..  She reports chronic cough that was not worse than normal lately.  She is not on home O2.  There have been cases of COVID-19 in her SNF.  No lower back pain but she reported neck pain after the fall.  ED Course: When the patient came to the ER, BP was 164/91 and heart rate 105, respiratory rate was 22 and pulse oximetry was 96% on 4 L of O2 by nasal cannula.  Labs revealed a BUN of 25 with a creatinine of 1.11 and CBC was within normal.  Influenza, RSV and COVID PCR's came back negative.  EKG as reviewed by me : Sinus tachycardia with a rate of 107 with poor R wave progression. Imaging: Chest x-ray showed mild cardiomegaly and linear atelectasis or scar at the left base.  Left forearm x-ray showed no acute findings.  4 view knee x-ray was negative except for mild arthritic changes. Noncontrasted head CT scan revealed the following: 1. Marked severity frontal scalp soft tissue swelling with an associated large, lobulated scalp soft tissue hematoma. 2. No acute intracranial abnormality. 3. Small, chronic right basal ganglia lacunar infarct. 4. Generalized cerebral atrophy  and microvascular disease changes of the supratentorial brain.  C-spine CT showed the following: . T1 compression fracture which could be subacute to chronic. Remote T2 compression fracture. 2. Negative for cervical spine fracture or subluxation.  Chest CTA showed the following: 1. No pulmonary embolism. 2. Mild multi-vessel coronary artery calcification. Stable mild global cardiomegaly. Stable small pericardial effusion. 3. Bronchomalacia involving the mainstem bronchi bilaterally and bronchus intermedius. 4. Interval enlargement of a anterior mediastinal mass most in keeping with a thymoma or lymphoma. Surgical consultation may be helpful for further management. 5. Stable 4.6 cm left thyroid mass, not optimally assessed but likely benign given its stability over time. This was previously assessed on sonogram of 11/24/2014. In the setting of significant clinical comorbidity or limited life expectancy, no follow-up imaging is recommended. 6. Moderate hiatal hernia. 7. Stable 2.4 cm left adrenal adenoma. 8. Interval development of an age indeterminate compression deformity of T1 with approximately 30-40% loss of height. Correlation for point tenderness is recommended. If indicated, this could be better assessed with MRI examination. 9. Aortic Atherosclerosis.  The patient was given 1 g of p.o. Tylenol.  She will be admitted to a medical telemetry bed for further evaluation and management. PAST MEDICAL HISTORY:   Past Medical History:  Diagnosis Date  . Arthritis   . Asthma   . 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 Depression with anxiety   .  GERD (gastroesophageal reflux disease)   . Hemiparesis affecting left side as late effect of cerebrovascular accident (CVA) ([place])   . Hot flashes   . Hyperlipidemia   . Hypertension   . Muscle hypertonicity   . Osteoarthritis   . Stroke ([place])   . TIA (transient ischemic attack)     PAST SURGICAL HISTORY:   Past  Surgical History:  Procedure Laterality Date  . ACHILLES TENDON LENGTHENING  08/25/2015   Procedure: ACHILLES TENDON LENGTHENING;  Surgeon: Samara Deist, DPM;  Location: [facility] ORS;  Service: Podiatry;;  . APPENDECTOMY    . COLONOSCOPY  06/17/2008  . EYE SURGERY Bilateral    Cataract Extraction with IOL  . FLAT FOOT RECONSTRUCTION-TAL GASTROC RECESSION Left 08/25/2015   Procedure: ENDOSCOPIC FLAT FOOT RECONSTRUCTION-TAL GASTROC RECESSION;  Surgeon: Samara Deist, DPM;  Location: [facility] ORS;  Service: Podiatry;  Laterality: Left;  Marland Kitchen VAGINAL HYSTERECTOMY      SOCIAL HISTORY:   Social History   Tobacco Use  . Smoking status: Former    Packs/day: 0.50    Years: 3.00    Additional pack years: 0.00    Total pack years: 1.50    Types: Cigarettes    Quit date: 01/08/1963    Years since quitting: 59.2  . Smokeless tobacco: Never  Substance Use Topics  . Alcohol use: No    Alcohol/week: 0.0 standard drinks of alcohol    Comment: does not drink alcohol    FAMILY HISTORY:   Family History  Problem Relation Age of Onset  . Alcohol abuse Mother   . Coronary artery disease Mother   . Stroke Mother   . Liver cancer Father   . Alcohol abuse Brother   . Coronary artery disease Brother   . Diabetes type II Brother   . Liver cancer Brother     DRUG ALLERGIES:   Allergies  Allergen Reactions  . Tizanidine Other (See Comments)    HYPOTENSION AND UNRESPONSIVENESS  . Ace Inhibitors Cough  . Latex Hives  . Lisinopril Cough  . Tetracycline Other (See Comments)     REACTION: yeast  . Zanaflex [Tizanidine Hcl] Other (See Comments)    "drowsy"    REVIEW OF SYSTEMS:   ROS As per history of present illness. All pertinent systems were reviewed above. Constitutional, HEENT, cardiovascular, respiratory, GI, GU, musculoskeletal, neuro, psychiatric, endocrine, integumentary and hematologic systems were reviewed and are otherwise negative/unremarkable except for positive findings mentioned  above in the HPI.   MEDICATIONS AT HOME:   Prior to Admission medications   Medication Sig Start Date End Date Taking? Authorizing Provider  acetaminophen (TYLENOL) 500 MG tablet Take 1,000 mg by mouth every 6 (six) hours as needed (osteoarthritis of left shoulder).    [provider]  alum & mag hydroxide-simeth (MAALOX PLUS) 400-400-40 MG/5ML suspension Take 30 mLs by mouth every 4 (four) hours as needed for indigestion.     [provider]  bisacodyl (DULCOLAX) 5 MG EC tablet Take 5 mg by mouth every other day.    [provider]  Calcium Carbonate-Vitamin D 600-400 MG-UNIT tablet Take 1 tablet by mouth 2 (two) times daily.    [provider]  carvedilol (COREG) 3.125 MG tablet Take 3.125 mg by mouth 2 (two) times daily.     [provider]  Cholecalciferol (VITAMIN D3) 125 MCG (5000 UT) CAPS Take 5,000 Units by mouth daily. 05/25/21   [provider]  demeclocycline (DECLOMYCIN) 300 MG tablet Take 300 mg by mouth 2 (  two) times daily.     [provider]  dipyridamole-aspirin (AGGRENOX) 200-25 MG 12hr capsule Take 1 capsule by mouth every 12 (twelve) hours.     [provider]  divalproex (DEPAKOTE) 125 MG DR tablet Take 125 mg by mouth 2 (two) times daily. 02/15/20   [provider]  EUCERIN ITCH RELIEF 0.1 % LOTN Apply topically 2 (two) times daily. To dry area on left breast 02/26/21   [provider]  gabapentin (NEURONTIN) 800 MG tablet Take 800 mg by mouth every 8 (eight) hours. 01/12/18   [provider]  guaiFENesin (MUCINEX) 600 MG 12 hr tablet Take 2 tablets (1,200 mg total) by mouth 2 (two) times daily as needed. Home med. 07/15/21   Enzo Bi, MD  hydrocortisone cream 1 % Apply 1 Application topically 2 (two) times daily. hemorrhoids    [provider]  ipratropium-albuterol (DUONEB) 0.5-2.5 (3) MG/3ML SOLN Take 3 mLs by nebulization every 6 (six) hours as needed.  12/06/17    [provider]  levothyroxine (SYNTHROID, LEVOTHROID) 75 MCG tablet Take 75 mcg by mouth daily before breakfast.  11/29/17   [provider]  montelukast (SINGULAIR) 10 MG tablet Take 10 mg by mouth at bedtime.     [provider]  NYSTATIN powder Apply topically 2 (two) times daily. 06/19/21   [provider]  omeprazole (PRILOSEC) 20 MG capsule Take 20 mg by mouth 2 (two) times daily.     [provider]  OXYGEN O2 as needed.  Titrate to keep sat > / = 92 01/22/18   [provider]  polyethylene glycol (MIRALAX / GLYCOLAX) packet Take 17 g by mouth daily. dx: constipation  Hold if loose stool    [provider]  prazosin (MINIPRESS) 1 MG capsule Take 3 mg by mouth at bedtime. 3 capsules    [provider]  sennosides-docusate sodium (SENOKOT-S) 8.6-50 MG tablet Take 2 tablets by mouth 2 (two) times daily as needed for constipation. Also take 1 tab by mouth at bedtime daily. 10/31/17   [provider]  Skin Protectants, Misc. (ENDIT EX) Apply liberal amount topically to areas of skin irritation as needed. Ok to leave at bedside.    [provider]  sodium chloride 1 g tablet Take 1 tablet (1 g total) by mouth 3 (three) times daily with meals. 07/15/21   Enzo Bi, MD  spironolactone (ALDACTONE) 50 MG tablet Take 50 mg by mouth daily.    [provider]  THERATEARS 0.25 % SOLN Apply to eye. 02/10/20   [provider]  torsemide (DEMADEX) 10 MG tablet Take 10 mg by mouth daily.     [provider]  triamcinolone (NASACORT) 55 MCG/ACT AERO nasal inhaler Place 1 spray into the nose daily.    [provider]  UNABLE TO FIND Diet Type: Regular. No Sodium Restriction. Gatorade on lunch and supper trays for hyponatremia    [provider]  venlafaxine XR (EFFEXOR-XR) 37.5 MG 24 hr capsule Take 37.5 mg by mouth daily. 07/12/21   [provider]      VITAL SIGNS:   Blood pressure (!) 165/85, pulse 100, temperature 98.4 F (36.9 C), temperature source Oral, resp. rate (!) 31, height 5\' 1"  (1.549 m), weight 85 kg, SpO2 95 %.  PHYSICAL EXAMINATION:  Physical Exam  GENERAL:  83 y.o.-year-old Caucasian female patient lying in the bed with no acute distress.  EYES: Pupils equal, round, reactive to light and accommodation. No  scleral icterus. Extraocular muscles intact.  HEENT: Head atraumatic, normocephalic. Oropharynx and nasopharynx clear.  NECK:  Supple, no jugular venous distention. No thyroid enlargement, no tenderness.  LUNGS: Normal breath sounds bilaterally, no wheezing, rales,rhonchi or crepitation. No use of accessory muscles of respiration.  CARDIOVASCULAR: Regular rate and rhythm, S1, S2 normal. No murmurs, rubs, or gallops.  ABDOMEN: Soft, nondistended, nontender. Bowel sounds present. No organomegaly or mass.  EXTREMITIES: No pedal edema, cyanosis, or clubbing.  NEUROLOGIC: Cranial nerves II through XII are intact. Muscle strength 5/5 in all extremities. Sensation intact. Gait not checked.  PSYCHIATRIC: The patient is alert and oriented x 3.  Normal affect and good eye contact. SKIN: No obvious rash, lesion, or ulcer.   LABORATORY PANEL:   CBC Recent Labs  Lab 03/27/22 2044  WBC 9.5  HGB 12.4  HCT 39.6  PLT 212   ------------------------------------------------------------------------------------------------------------------  Chemistries  Recent Labs  Lab 03/27/22 2044  NA 137  K 4.6  CL 99  CO2 27  GLUCOSE 110*  BUN 25*  CREATININE 1.11*  CALCIUM 10.0   ------------------------------------------------------------------------------------------------------------------  Cardiac Enzymes No results for input(s): "TROPONINI" in the last 168 hours. ------------------------------------------------------------------------------------------------------------------  RADIOLOGY:  DG Knee Complete 4 Views Right  Result Date:  03/27/2022 CLINICAL DATA:  Fall and trauma to the right knee. EXAM: RIGHT KNEE - COMPLETE 4+ VIEW COMPARISON:  None Available. FINDINGS: No acute fracture or dislocation. The bones are osteopenic. Mild arthritic changes and tricompartmental narrowing. No significant joint effusion. The soft tissues are unremarkable. IMPRESSION: 1. No acute fracture or dislocation. 2. Mild arthritic changes. Electronically Signed   By: Anner Crete M.D.   On: 03/27/2022 21:29   DG Knee Complete 4 Views Left  Result Date: 03/27/2022 CLINICAL DATA:  Fall with bilateral knee pain. EXAM: LEFT KNEE - COMPLETE 4 VIEW COMPARISON:  None Available. FINDINGS: No acute fracture or subluxation. A small joint effusion is possible. Generalized osteopenia. Undulating appearance of the lateral femoral condyle which appears chronic on the frontal view. Generalized osteopenia. IMPRESSION: No acute finding. Electronically Signed   By: Jorje Guild M.D.   On: 03/27/2022 21:29   DG Chest 1 View  Result Date: 03/27/2022 CLINICAL DATA:  Chest pain EXAM: CHEST  1 VIEW COMPARISON:  07/14/2021 FINDINGS: Right diaphragm appears slightly elevated. Linear atelectasis or scar at the left base. Mild cardiomegaly with aortic atherosclerosis. No pleural effusion or pneumothorax. IMPRESSION: Mild cardiomegaly. Linear atelectasis or scar at the left base. Electronically Signed   By: Donavan Foil M.D.   On: 03/27/2022 21:28   DG Forearm Left  Result Date: 03/27/2022 CLINICAL DATA:  Contracture.  Fall. EXAM: LEFT FOREARM - 2 VIEW COMPARISON:  None Available. FINDINGS: Wrist and hand positioning correlating with history of contracture. Generalized osteopenia. No acute fracture or dislocation. IMPRESSION: No acute finding. Electronically Signed   By: Jorje Guild M.D.   On: 03/27/2022 21:27   CT Cervical Spine Wo Contrast  Result Date: 03/27/2022 CLINICAL DATA:  Blunt facial trauma. EXAM: CT CERVICAL SPINE WITHOUT CONTRAST TECHNIQUE:  Multidetector CT imaging of the cervical spine was performed without intravenous contrast. Multiplanar CT image reconstructions were also generated. RADIATION DOSE REDUCTION: This exam was performed according to the departmental dose-optimization program which includes automated exposure control, adjustment of the mA and/or kV according to patient size and/or use of iterative reconstruction technique. COMPARISON:  None Available. FINDINGS: Alignment: Exaggerated kyphosis at the cervicothoracic junction. Skull base and vertebrae: T1 body fracture with horizontal fracture plane and impaction.  Trace retropulsion. The fracture could be subacute given the indistinct margins. Remote T2 compression fracture with similar mild to moderate height loss. No acute cervical spine fracture. Soft tissues and spinal canal: Nodule in the left lobe thyroid measuring at least 5.2 cm. This was evaluated by ultrasound 11/24/2014 with biopsy recommended. No worrisome growth given the long interval. No follow-up recommended unless clinically warranted (ref: J Am Coll Radiol. 2015 Feb;12(2): 143-50). Disc levels:  Ordinary degenerative changes Upper chest: No acute finding. IMPRESSION: 1. T1 compression fracture which could be subacute to chronic. Remote T2 compression fracture. 2. Negative for cervical spine fracture or subluxation. Electronically Signed   By: Jorje Guild M.D.   On: 03/27/2022 21:19   CT Head Wo Contrast  Result Date: 03/27/2022 CLINICAL DATA:  Status post trauma. EXAM: CT HEAD WITHOUT CONTRAST TECHNIQUE: Contiguous axial images were obtained from the base of the skull through the vertex without intravenous contrast. RADIATION DOSE REDUCTION: This exam was performed according to the departmental dose-optimization program which includes automated exposure control, adjustment of the mA and/or kV according to patient size and/or use of iterative reconstruction technique. COMPARISON:  November 26, 2014 FINDINGS: Brain:  There is moderate severity cerebral atrophy with widening of the extra-axial spaces and ventricular dilatation. There are areas of decreased attenuation within the white matter tracts of the supratentorial brain, consistent with microvascular disease changes. A small, chronic right basal ganglia lacunar infarct is noted. Vascular: No hyperdense vessel or unexpected calcification. Skull: Normal. Negative for fracture or focal lesion. Sinuses/Orbits: No acute finding. Other: Marked severity bifrontal scalp soft tissue swelling is seen along the midline, left greater than right. This extends along the vertex. An associated large, lobulated scalp soft tissue hematoma is noted IMPRESSION: 1. Marked severity frontal scalp soft tissue swelling with an associated large, lobulated scalp soft tissue hematoma. 2. No acute intracranial abnormality. 3. Small, chronic right basal ganglia lacunar infarct. 4. Generalized cerebral atrophy and microvascular disease changes of the supratentorial brain. Electronically Signed   By: Virgina Norfolk M.D.   On: 03/27/2022 21:09      IMPRESSION AND PLAN:  Assessment and Plan: No notes have been filed under this hospital service. Service: Hospitalist      DVT prophylaxis: Lovenox***  Advanced Care Planning:  Code Status: full code***  Family Communication:  The plan of care was discussed in details with the patient (and family). I answered all questions. The patient agreed to proceed with the above mentioned plan. Further management will depend upon hospital course. Disposition Plan: Back to previous home environment Consults called: none***  All the records are reviewed and case discussed with ED provider.  Status is: Inpatient {Inpatient:23812}   At the time of the admission, it appears that the appropriate admission status for this patient is inpatient.  This is judged to be reasonable and necessary in order to provide the required intensity of service to ensure  the patient's safety given the presenting symptoms, physical exam findings and initial radiographic and laboratory data in the context of comorbid conditions.  The patient requires inpatient status due to high intensity of service, high risk of further deterioration and high frequency of surveillance required.  I certify that at the time of admission, it is my clinical judgment that the patient will require inpatient hospital care extending more than 2 midnights.  Dispo: The patient is from: Home              Anticipated d/c is to: Home              Patient currently is not medically stable to d/c.              Difficult to place patient: No  Christel Mormon M.D on 03/27/2022 at 10:59 PM  [facility]   From 7 PM-7 AM, contact night-coverage [email or web address]  CC: Primary care physician; Alvester Morin, MD

## 2022-03-27 NOTE — ED Triage Notes (Addendum)
Pt brought in via [facility] from [facility].  Pt slipped off the toilet and has chest pain and left arm pain.    Pt reports neck and back pain.  Pt oxygen sats low in triage.  Pt placed on 4 liters oxygen [place] in triage.  Pt alert  pt on a stretcher in triage.   Pt also has bilateral knee pain pain with abrasions

## 2022-03-28 DIAGNOSIS — J45901 Unspecified asthma with (acute) exacerbation: Secondary | ICD-10-CM

## 2022-03-28 DIAGNOSIS — J9601 Acute respiratory failure with hypoxia: Secondary | ICD-10-CM | POA: Diagnosis not present

## 2022-03-28 DIAGNOSIS — Z7189 Other specified counseling: Secondary | ICD-10-CM

## 2022-03-28 DIAGNOSIS — W19XXXA Unspecified fall, initial encounter: Secondary | ICD-10-CM | POA: Insufficient documentation

## 2022-03-28 DIAGNOSIS — D4989 Neoplasm of unspecified behavior of other specified sites: Secondary | ICD-10-CM

## 2022-03-28 LAB — CBC
HCT: 36.6 % (ref 36.0–46.0)
Hemoglobin: 11.6 g/dL — ABNORMAL LOW (ref 12.0–15.0)
MCH: 29.7 pg (ref 26.0–34.0)
MCHC: 31.7 g/dL (ref 30.0–36.0)
MCV: 93.8 fL (ref 80.0–100.0)
Platelets: 215 10*3/uL (ref 150–400)
RBC: 3.9 MIL/uL (ref 3.87–5.11)
RDW: 13.1 % (ref 11.5–15.5)
WBC: 7.9 10*3/uL (ref 4.0–10.5)
nRBC: 0 % (ref 0.0–0.2)

## 2022-03-28 LAB — BASIC METABOLIC PANEL
Anion gap: 8 (ref 5–15)
BUN: 25 mg/dL — ABNORMAL HIGH (ref 8–23)
CO2: 27 mmol/L (ref 22–32)
Calcium: 10 mg/dL (ref 8.9–10.3)
Chloride: 101 mmol/L (ref 98–111)
Creatinine, Ser: 0.93 mg/dL (ref 0.44–1.00)
GFR, Estimated: >60 mL/min (ref >60)
Glucose, Bld: 134 mg/dL — ABNORMAL HIGH (ref 70–99)
Potassium: 5.3 mmol/L — ABNORMAL HIGH (ref 3.5–5.1)
Sodium: 136 mmol/L (ref 135–145)

## 2022-03-28 LAB — HIV ANTIBODY (ROUTINE TESTING W REFLEX): HIV Screen 4th Generation wRfx: NONREACTIVE

## 2022-03-28 MED ORDER — METHYLPREDNISOLONE SODIUM SUCC 40 MG IJ SOLR
40.0000 mg | Freq: Two times a day (BID) | INTRAMUSCULAR | Status: AC
  Administered 2022-03-28 (×2): 40 mg via INTRAVENOUS
  Filled 2022-03-28 (×2): qty 1

## 2022-03-28 MED ORDER — HYDRALAZINE HCL 50 MG PO TABS: 50.0000 mg | ORAL_TABLET | Freq: Four times a day (QID) | ORAL | Status: DC | PRN

## 2022-03-28 MED ORDER — IPRATROPIUM-ALBUTEROL 0.5-2.5 (3) MG/3ML IN SOLN
3.0000 mL | Freq: Three times a day (TID) | RESPIRATORY_TRACT | Status: DC
  Administered 2022-03-28 – 2022-03-29 (×3): 3 mL via RESPIRATORY_TRACT
  Filled 2022-03-28 (×3): qty 3

## 2022-03-28 MED ORDER — AZITHROMYCIN 500 MG PO TABS
500.0000 mg | ORAL_TABLET | ORAL | Status: AC
  Administered 2022-03-28 – 2022-03-30 (×3): 500 mg via ORAL
  Filled 2022-03-28 (×3): qty 1

## 2022-03-28 MED ORDER — GUAIFENESIN ER 600 MG PO TB12
600.0000 mg | ORAL_TABLET | Freq: Two times a day (BID) | ORAL | Status: DC
  Administered 2022-03-28 – 2022-04-01 (×10): 600 mg via ORAL
  Filled 2022-03-28 (×10): qty 1

## 2022-03-28 MED ORDER — SODIUM ZIRCONIUM CYCLOSILICATE 10 G PO PACK
10.0000 g | PACK | Freq: Once | ORAL | Status: AC
  Administered 2022-03-28: 10 g via ORAL
  Filled 2022-03-28: qty 1

## 2022-03-28 MED ORDER — POLYETHYLENE GLYCOL 3350 17 G PO PACK
17.0000 g | PACK | Freq: Every day | ORAL | Status: DC
  Administered 2022-03-29 – 2022-04-01 (×4): 17 g via ORAL
  Filled 2022-03-28 (×4): qty 1

## 2022-03-28 MED ORDER — MORPHINE SULFATE (PF) 2 MG/ML IV SOLN
1.0000 mg | INTRAVENOUS | Status: DC | PRN
  Administered 2022-03-28 – 2022-03-31 (×4): 2 mg via INTRAVENOUS
  Filled 2022-03-28 (×4): qty 1

## 2022-03-28 MED ORDER — HYDROCOD POLI-CHLORPHE POLI ER 10-8 MG/5ML PO SUER: 5.0000 mL | Freq: Two times a day (BID) | ORAL | Status: DC | PRN

## 2022-03-28 MED ORDER — HYDRALAZINE HCL 20 MG/ML IJ SOLN: 10.0000 mg | Freq: Four times a day (QID) | INTRAMUSCULAR | Status: DC | PRN

## 2022-03-28 MED ORDER — PREDNISONE 20 MG PO TABS
40.0000 mg | ORAL_TABLET | Freq: Every day | ORAL | Status: AC
  Administered 2022-03-29 – 2022-04-01 (×4): 40 mg via ORAL
  Filled 2022-03-28 (×4): qty 2

## 2022-03-28 MED ORDER — ADULT MULTIVITAMIN W/MINERALS CH
1.0000 | ORAL_TABLET | Freq: Every day | ORAL | Status: DC
  Administered 2022-03-28 – 2022-04-01 (×5): 1 via ORAL
  Filled 2022-03-28 (×5): qty 1

## 2022-03-28 MED ORDER — IPRATROPIUM-ALBUTEROL 0.5-2.5 (3) MG/3ML IN SOLN
3.0000 mL | Freq: Four times a day (QID) | RESPIRATORY_TRACT | Status: DC
  Administered 2022-03-28: 3 mL via RESPIRATORY_TRACT
  Filled 2022-03-28: qty 3

## 2022-03-28 MED ORDER — FLUTICASONE FUROATE-VILANTEROL 200-25 MCG/ACT IN AEPB
1.0000 | INHALATION_SPRAY | Freq: Every day | RESPIRATORY_TRACT | Status: DC
  Administered 2022-03-28 – 2022-04-01 (×5): 1 via RESPIRATORY_TRACT
  Filled 2022-03-28: qty 28

## 2022-03-28 NOTE — Assessment & Plan Note (Signed)
-   We will continue Synthroid. 

## 2022-03-28 NOTE — Evaluation (Addendum)
Physical Therapy Evaluation Patient Details Name: [name] MRN: [id number] DOB: December 03, 1940 Today's Date: 03/28/2022  History of Present Illness  Pt is an 82 y/o F admitted on 03/27/22 after presenting to the ED with c/o falling off the toilet at her SNF. Pt noted to be hypoxic & placed on 4L O2. PMH: asthma, GERD, depression, anxiety, HTN, dyslipidemia, OA, CVA  Clinical Impression  Pt seen for PT evaluation with co-tx with OT for pt & therapists' safety. Pt reports prior to admission she required assistance for bed mobility & stand by assist for stand pivot transfers, was working on walking with [place] with therapy. On this date, pt requires +2 assist for bed mobility, max assist for STS & min assist stand pivot. Utilized L AFO for increased ease of mobility throughout session. Pt with continent BM on BSC, requiring total assist for peri hygiene. Recommend ongoing PT services to address activity tolerance & transfers.       Recommendations for follow up therapy are one component of a multi-disciplinary discharge planning process, led by the attending physician.  Recommendations may be updated based on patient status, additional functional criteria and insurance authorization.  Follow Up Recommendations Addendum: LTC (rehab in LTC) Can patient physically be transported by private vehicle: No    Assistance Recommended at Discharge Frequent or constant Supervision/Assistance  Patient can return home with the following  Two people to help with walking and/or transfers;Two people to help with bathing/dressing/bathroom;Help with stairs or ramp for entrance;Assist for transportation;Direct supervision/assist for medications management;Assistance with cooking/housework;Direct supervision/assist for financial management    Equipment Recommendations None recommended by PT  Recommendations for Other Services       Functional Status Assessment Patient has had a recent decline in their  functional status and demonstrates the ability to make significant improvements in function in a reasonable and predictable amount of time.     Precautions / Restrictions Precautions Precautions: Fall Precaution Comments: L hemi Required Braces or Orthoses: Other Brace Other Brace: L AFO Restrictions Weight Bearing Restrictions: No      Mobility  Bed Mobility Overal bed mobility: Needs Assistance Bed Mobility: Supine to Sit     Supine to sit: Max assist, +2 for physical assistance, HOB elevated          Transfers Overall transfer level: Needs assistance Equipment used: None Transfers: Sit to/from Stand, Bed to chair/wheelchair/BSC Sit to Stand: Max assist (STS mod assist from [facility] with [facility]) Stand pivot transfers: Min assist (extra time to complete stand pivot bed>recliner)              Ambulation/Gait                  Stairs            Wheelchair Mobility    Modified Rankin (Stroke Patients Only)       Balance Overall balance assessment: Needs assistance Sitting-balance support: Feet supported, Bilateral upper extremity supported Sitting balance-Leahy Scale: Fair     Standing balance support: During functional activity, Single extremity supported Standing balance-Leahy Scale: Poor                               Pertinent Vitals/Pain Pain Assessment Pain Assessment: Faces Faces Pain Scale: Hurts little more Pain Location: LUE with PROM, hemorrhoids with peri hygiene Pain Descriptors / Indicators: Discomfort, Dull, Grimacing Pain Intervention(s): Repositioned, Monitored during session    Home Living Family/patient expects  to be discharged to:: Skilled nursing facility                        Prior Function Prior Level of Function : Needs assist             Mobility Comments: SBA for pivot t/f, can walk with platform RW with therapy & L AFO. ADLs Comments: assist for LB bathing/dressing. W/c for community  mobility.     Hand Dominance   Dominant Hand: Right    Extremity/Trunk Assessment   Upper Extremity Assessment Upper Extremity Assessment: LUE deficits/detail LUE Deficits / Details: chronic hemi    Lower Extremity Assessment Lower Extremity Assessment: LLE deficits/detail LLE Deficits / Details: chronic hemi, L knee does not flex    Cervical / Trunk Assessment Cervical / Trunk Assessment: Kyphotic  Communication   Communication: No difficulties  Cognition Arousal/Alertness: Awake/alert Behavior During Therapy: WFL for tasks assessed/performed Overall Cognitive Status: Within Functional Limits for tasks assessed                                          General Comments General comments (skin integrity, edema, etc.): Pt on 2L/min via nasal cannula with lowest SPO2 86% but quickly increasing to >/=89%. Max HR 120 bpm    Exercises     Assessment/Plan    PT Assessment Patient needs continued PT services  PT Problem List Decreased strength;Cardiopulmonary status limiting activity;Decreased activity tolerance;Decreased balance;Decreased mobility;Decreased knowledge of use of DME;Decreased safety awareness       PT Treatment Interventions Therapeutic exercise;DME instruction;Gait training;Balance training;Neuromuscular re-education;Stair training;Functional mobility training    PT Goals (Current goals can be found in the Care Plan section)  Acute Rehab PT Goals Patient Stated Goal: none stated PT Goal Formulation: With patient Time For Goal Achievement: 04/11/22 Potential to Achieve Goals: Fair    Frequency Min 2X/week     Co-evaluation PT/OT/SLP Co-Evaluation/Treatment: Yes Reason for Co-Treatment: Complexity of the patient's impairments (multi-system involvement);For patient/therapist safety;To address functional/ADL transfers PT goals addressed during session: Mobility/safety with mobility;Balance;Strengthening/ROM         AM-PAC PT "6  Clicks" Mobility  Outcome Measure Help needed turning from your back to your side while in a flat bed without using bedrails?: Total Help needed moving from lying on your back to sitting on the side of a flat bed without using bedrails?: Total Help needed moving to and from a bed to a chair (including a wheelchair)?: A Lot Help needed standing up from a chair using your arms (e.g., wheelchair or bedside chair)?: A Lot Help needed to walk in hospital room?: Total Help needed climbing 3-5 steps with a railing? : Total 6 Click Score: 8    End of Session Equipment Utilized During Treatment:  (L AFO) Activity Tolerance: Patient tolerated treatment well Patient left: in chair;with call bell/phone within reach Nurse Communication: Mobility status PT Visit Diagnosis: Unsteadiness on feet (R26.81);Other abnormalities of gait and mobility (R26.89)    Time: [id number] PT Time Calculation (min) (ACUTE ONLY): 31 min   Charges:   PT Evaluation $PT Eval Moderate Complexity: [place], PT, DPT 03/28/22, 12:53 PM   Waunita Schooner 03/28/2022, 11:17 AM

## 2022-03-28 NOTE — Progress Notes (Signed)
Initial Nutrition Assessment  DOCUMENTATION CODES:   Obesity unspecified  INTERVENTION:   -MVI with minerals daily -Downgrade diet to dysphagia 3 diet for ease of intake -Feeding assistance with meals -Magic cup BID with meals, each supplement provides 290 kcal and 9 grams of protein  -Obtain new wt  NUTRITION DIAGNOSIS:   Increased nutrient needs related to acute illness as evidenced by estimated needs.  GOAL:   Patient will meet greater than or equal to 90% of their needs  MONITOR:   PO intake, Supplement acceptance  REASON FOR ASSESSMENT:   Consult Assessment of nutrition requirement/status  ASSESSMENT:   Pt with medical history significant for asthma, GERD, depression, and anxiety, hypertension, dyslipidemia, osteoarthritis and CVA, who presented with acute onset of fall off of the commode at her SNF.  Pt admitted with respiratory failure secondary to asthma exacerbation and s/p fall.   Reviewed I/O's: -500 ml x 24 hours  Spoke with pt, who was sitting on her recliner chair at time of visit. Pt reports fair appetite, consuming muffin, coffee, and grits this morning. Pt does not like the eggs that are served at the hospital. PTA pt reports that she tries to eat, however, often does not like the meal selections provided at the SNF ("thye have had budget cuts and the food quality has suffered"). Pt has her lt arm in a sling and often has difficulty opening up containers/ packages as well as cutting up food.   Reviewed wt hx; no wt changes noted, however, unsure if most recent wt is a stated wt vs measured wt. Per pt, denies any weight loss ("I think my weight is the same").   Discussed importance of good meal and supplement intake to promote healing. RD provided chocolate milk per her request.    Medications reviewed and include vitamin D3, solu-medrol, and miralax.  Labs reviewed: K: 5.3.    NUTRITION - FOCUSED PHYSICAL EXAM:  Flowsheet Row Most Recent Value   Orbital Region No depletion  Upper Arm Region Mild depletion  Thoracic and Lumbar Region No depletion  Buccal Region No depletion  Temple Region No depletion  Clavicle Bone Region No depletion  Clavicle and Acromion Bone Region No depletion  Scapular Bone Region No depletion  Dorsal Hand Mild depletion  Patellar Region No depletion  Anterior Thigh Region No depletion  Posterior Calf Region No depletion  Edema (RD Assessment) Mild  Hair Reviewed  Eyes Reviewed  Mouth Reviewed  Skin Reviewed  Nails Reviewed       Diet Order:   Diet Order             DIET DYS 3 Fluid consistency: Thin  Diet effective now                   EDUCATION NEEDS:   Education needs have been addressed  Skin:  Skin Assessment: Reviewed RN Assessment  Last BM:  03/27/22  Height:   Ht Readings from Last 1 Encounters:  03/27/22 5\' 1"  (1.549 m)    Weight:   Wt Readings from Last 1 Encounters:  03/27/22 85 kg    Ideal Body Weight:  47.7 kg  BMI:  Body mass index is 35.41 kg/m.  Estimated Nutritional Needs:   Kcal:  1450-1650  Protein:  65-80 grams  Fluid:  > 1.4 L    Loistine Chance, RD, LDN, [place] Registered Dietitian II Certified Diabetes Care and Education Specialist Please refer to [facility] for RD and/or RD on-call/weekend/after hours pager

## 2022-03-28 NOTE — Plan of Care (Signed)

## 2022-03-28 NOTE — Progress Notes (Signed)
[facility] Progress Note  Patient: [name]    [id number]  DOA: 03/27/2022     Date of Service: the patient was seen and examined on 03/28/2022  No chief complaint on file.  Brief hospital course: ancy J [address] [name] is a 82 y.o. Caucasian female with medical history significant for asthma, GERD, depression, and anxiety, hypertension, dyslipidemia, osteoarthritis and CVA, who presented to the emergency room with acute onset of fall off of the commode at her SNF.  Patient was found to have 85% on room air.  Respiratory failure due to asthma exacerbation. ED workup BP 164/91, HR 105, RR22 and pulse ox 96 on 4L Ox via [place] Influenza, RSV and COVID PCR's Negative.   CT Head: Marked severity frontal scalp soft tissue swelling with an associated large, lobulated scalp soft tissue hematoma.  No acute intracranial abnormality.  C-spine CT showed the following: . T1 compression fracture which could be subacute to chronic. Remote T2 compression fracture. 2. Negative for cervical spine fracture or subluxation.  CTA Chest Negative PE, Bronchomalacia, anterior mediastinal mas, Stable 4.6 cm left thyroid mass. Moderate hiatal hernia. Stable 2.4 cm left adrenal adenoma.  Interval development of an age indeterminate compression deformity of T1 with approximately 30-40% loss of height. Correlation for point tenderness is recommended. If indicated, this could be better assessed with MRI examination. Several other chronic findings, please review complete report for details.  Assessment and Plan:  # Acute hypoxic respiratory failure most likely due to send patient Continues supplemental oxygenation and gradually wean off S/p IV steroids, continue prednisone 40 mg p.o. daily for 4 days Continue DuoNeb TID  Mucinex 600 twice daily Tussionex PRN and Duoneb PRN  Started Breo-elipta inh and Azithromycin 500 pod for ppx   # Mediastinal mass which is chronic since 2016 Discussed with IR  received folowing reply ( unfortunately this mass is nestled right between the sternum and great vessels, so not something I can access percutaneously. Would likely need mediastinoscopy to sample, if someone is willing to do it. It is slow growing (we saw it in 2016, and was small), so grew ~3cm in 8 years? Might not be anything life limiting depending on her comorbidities ) So I don't think she needs biopsy at this time I would consult palliative care for goals of care discussion.   Fall at home, initial encounter - This could be an accidental mechanical fall at her SNF. - Could be partly contributed to by her hypoxia. - PT consult to be obtained. - Management otherwise as above.   Hypothyroidism - continue Synthroid.   Essential hypertension, benign -continue her antihypertensives.   Gastroesophageal reflux disease without esophagitis -continue PPI therapy.   Peripheral neuropathy - continue her Neurontin.  # Old CVA with Residual Left Hemiparesis  Continue supportive care, Fall and aspiration precautions  Turn q2hrs   Body mass index is 35.41 kg/m.  Nutrition Problem: Increased nutrient needs Etiology: acute illness Interventions: Interventions: MVI, Magic cup, Liberalize Diet      Diet: Dysphagia 3 diet DVT Prophylaxis: Subcutaneous Lovenox   Advance goals of care discussion: Full code  Family Communication: family was not present at bedside, at the time of interview.  The pt provided permission to discuss medical plan with the family. Opportunity was given to ask question and all questions were answered satisfactorily.   Disposition:  Pt is from SNF, admitted with Resp failure, still has Resp Failure, which precludes  safe discharge. Discharge to SNF, when stable,  may need 1-2 days to improve  Subjective: No issues overnight, mils SOB, no chest pain, or palpitations  Physical Exam: General: NAD, lying comfortably Appear in no distress, affect  appropriate Eyes: PERRLA ENT: Oral Mucosa Clear, moist  Neck: no JVD,  Cardiovascular: S1 and S2 Present, no Murmur,  Respiratory: good respiratory effort, Bilateral Air entry equal and Decreased, mild Crackles, mild wheezes Abdomen: Bowel Sound present, Soft and no tenderness,  Skin: no rashes Extremities: no Pedal edema, no calf tenderness Neurologic: Residual left Hemiparesis due to old CVA, no new findings   Gait not checked due to patient safety concerns  Vitals:   03/28/22 0058 03/28/22 0738 03/28/22 0810 03/28/22 1336  BP: (!) 183/90  139/73   Pulse: (!) 110  90   Resp: 18  17   Temp: 98.2 F (36.8 C)  97.9 F (36.6 C)   TempSrc:      SpO2: 96% 96% 95% 98%  Weight:      Height:        Intake/Output Summary (Last 24 hours) at 03/28/2022 1623 Last data filed at 03/28/2022 1018 Gross per 24 hour  Intake --  Output 500 ml  Net -500 ml   Filed Weights   03/27/22 2038  Weight: 85 kg    Data Reviewed: I have personally reviewed and interpreted daily labs, tele strips, imagings as discussed above. I reviewed all nursing notes, pharmacy notes, vitals, pertinent old records I have discussed plan of care as described above with RN and patient/family.  CBC: Recent Labs  Lab 03/27/22 2044 03/28/22 0701  WBC 9.5 7.9  HGB 12.4 11.6*  HCT 39.6 36.6  MCV 95.0 93.8  PLT 212 671   Basic Metabolic Panel: Recent Labs  Lab 03/27/22 2044 03/28/22 0701  NA 137 136  K 4.6 5.3*  CL 99 101  CO2 27 27  GLUCOSE 110* 134*  BUN 25* 25*  CREATININE 1.11* 0.93  CALCIUM 10.0 10.0    Studies: CT Angio Chest PE W and/or Wo Contrast  Result Date: 03/27/2022 CLINICAL DATA:  Pulmonary embolism (PE) suspected, high prob. Chest pain EXAM: CT ANGIOGRAPHY CHEST WITH CONTRAST TECHNIQUE: Multidetector CT imaging of the chest was performed using the standard protocol during bolus administration of intravenous contrast. Multiplanar CT image reconstructions and MIPs were obtained to  evaluate the vascular anatomy. RADIATION DOSE REDUCTION: This exam was performed according to the departmental dose-optimization program which includes automated exposure control, adjustment of the mA and/or kV according to patient size and/or use of iterative reconstruction technique. CONTRAST:  1mL OMNIPAQUE IOHEXOL 350 MG/ML SOLN COMPARISON:  11/23/2014 FINDINGS: Cardiovascular: There is adequate opacification of the pulmonary arterial tree. No intraluminal filling defect identified through the segmental level to suggest acute pulmonary embolism. The central pulmonary arteries are of normal caliber. Mild multi-vessel coronary artery calcification. Mild global cardiomegaly, stable. Small pericardial effusion is unchanged. Mild atherosclerotic calcification within the thoracic aorta. No aortic aneurysm. Mediastinum/Nodes: 4.4 x 4.6 cm left thyroid mass appears stable since prior examination, not optimally assessed but likely benign given its stability over time. This was previously assessed on sonogram of 11/24/2014. In the setting of significant comorbidities or limited life expectancy, no follow-up recommended (ref: J Am Coll Radiol. 2015 Feb;12(2): 143-50).Anterior mediastinal mass has enlarged in the interval since prior examination measuring 2.8 x 3.7 cm at axial image # 55/4, previously measuring 0.7 x 1.2 cm, most in keeping with a thymoma or lymphoma. Stable mass effect upon the proximal esophagus by the  thyroid mass. The esophagus is otherwise unremarkable. Moderate hiatal hernia. Lungs/Pleura: Bilateral dependent atelectasis. Ground-glass opacities within the lung apices likely represent atelectasis in this extra tori phase examination. There is marked narrowing of the mainstem bronchi and bronchus intermedius in the AP dimension in keeping with changes of bronchomalacia. No pneumothorax or pleural effusion. Upper Abdomen: Stable 2.4 cm left adrenal adenoma. No follow-up imaging is recommended for this  lesion. No acute abnormality within the visualized abdomen. Musculoskeletal: Interval development of an age indeterminate compression deformity of T1 with approximately 30-40% loss of height. Stable anterior wedge compression deformity of T2. Healed mid sternal fracture noted, new from prior examination. Osseous structures are otherwise age-appropriate. Review of the MIP images confirms the above findings. IMPRESSION: 1. No pulmonary embolism. 2. Mild multi-vessel coronary artery calcification. Stable mild global cardiomegaly. Stable small pericardial effusion. 3. Bronchomalacia involving the mainstem bronchi bilaterally and bronchus intermedius. 4. Interval enlargement of a anterior mediastinal mass most in keeping with a thymoma or lymphoma. Surgical consultation may be helpful for further management. 5. Stable 4.6 cm left thyroid mass, not optimally assessed but likely benign given its stability over time. This was previously assessed on sonogram of 11/24/2014. In the setting of significant clinical comorbidity or limited life expectancy, no follow-up imaging is recommended. 6. Moderate hiatal hernia. 7. Stable 2.4 cm left adrenal adenoma. 8. Interval development of an age indeterminate compression deformity of T1 with approximately 30-40% loss of height. Correlation for point tenderness is recommended. If indicated, this could be better assessed with MRI examination. Aortic Atherosclerosis (ICD10-I70.0). Electronically Signed   By: Fidela Salisbury M.D.   On: 03/27/2022 22:58   DG Knee Complete 4 Views Right  Result Date: 03/27/2022 CLINICAL DATA:  Fall and trauma to the right knee. EXAM: RIGHT KNEE - COMPLETE 4+ VIEW COMPARISON:  None Available. FINDINGS: No acute fracture or dislocation. The bones are osteopenic. Mild arthritic changes and tricompartmental narrowing. No significant joint effusion. The soft tissues are unremarkable. IMPRESSION: 1. No acute fracture or dislocation. 2. Mild arthritic changes.  Electronically Signed   By: Anner Crete M.D.   On: 03/27/2022 21:29   DG Knee Complete 4 Views Left  Result Date: 03/27/2022 CLINICAL DATA:  Fall with bilateral knee pain. EXAM: LEFT KNEE - COMPLETE 4 VIEW COMPARISON:  None Available. FINDINGS: No acute fracture or subluxation. A small joint effusion is possible. Generalized osteopenia. Undulating appearance of the lateral femoral condyle which appears chronic on the frontal view. Generalized osteopenia. IMPRESSION: No acute finding. Electronically Signed   By: Jorje Guild M.D.   On: 03/27/2022 21:29   DG Chest 1 View  Result Date: 03/27/2022 CLINICAL DATA:  Chest pain EXAM: CHEST  1 VIEW COMPARISON:  07/14/2021 FINDINGS: Right diaphragm appears slightly elevated. Linear atelectasis or scar at the left base. Mild cardiomegaly with aortic atherosclerosis. No pleural effusion or pneumothorax. IMPRESSION: Mild cardiomegaly. Linear atelectasis or scar at the left base. Electronically Signed   By: Donavan Foil M.D.   On: 03/27/2022 21:28   DG Forearm Left  Result Date: 03/27/2022 CLINICAL DATA:  Contracture.  Fall. EXAM: LEFT FOREARM - 2 VIEW COMPARISON:  None Available. FINDINGS: Wrist and hand positioning correlating with history of contracture. Generalized osteopenia. No acute fracture or dislocation. IMPRESSION: No acute finding. Electronically Signed   By: Jorje Guild M.D.   On: 03/27/2022 21:27   CT Cervical Spine Wo Contrast  Result Date: 03/27/2022 CLINICAL DATA:  Blunt facial trauma. EXAM: CT CERVICAL SPINE WITHOUT CONTRAST  TECHNIQUE: Multidetector CT imaging of the cervical spine was performed without intravenous contrast. Multiplanar CT image reconstructions were also generated. RADIATION DOSE REDUCTION: This exam was performed according to the departmental dose-optimization program which includes automated exposure control, adjustment of the mA and/or kV according to patient size and/or use of iterative reconstruction technique.  COMPARISON:  None Available. FINDINGS: Alignment: Exaggerated kyphosis at the cervicothoracic junction. Skull base and vertebrae: T1 body fracture with horizontal fracture plane and impaction. Trace retropulsion. The fracture could be subacute given the indistinct margins. Remote T2 compression fracture with similar mild to moderate height loss. No acute cervical spine fracture. Soft tissues and spinal canal: Nodule in the left lobe thyroid measuring at least 5.2 cm. This was evaluated by ultrasound 11/24/2014 with biopsy recommended. No worrisome growth given the long interval. No follow-up recommended unless clinically warranted (ref: J Am Coll Radiol. 2015 Feb;12(2): 143-50). Disc levels:  Ordinary degenerative changes Upper chest: No acute finding. IMPRESSION: 1. T1 compression fracture which could be subacute to chronic. Remote T2 compression fracture. 2. Negative for cervical spine fracture or subluxation. Electronically Signed   By: Jorje Guild M.D.   On: 03/27/2022 21:19   CT Head Wo Contrast  Result Date: 03/27/2022 CLINICAL DATA:  Status post trauma. EXAM: CT HEAD WITHOUT CONTRAST TECHNIQUE: Contiguous axial images were obtained from the base of the skull through the vertex without intravenous contrast. RADIATION DOSE REDUCTION: This exam was performed according to the departmental dose-optimization program which includes automated exposure control, adjustment of the mA and/or kV according to patient size and/or use of iterative reconstruction technique. COMPARISON:  November 26, 2014 FINDINGS: Brain: There is moderate severity cerebral atrophy with widening of the extra-axial spaces and ventricular dilatation. There are areas of decreased attenuation within the white matter tracts of the supratentorial brain, consistent with microvascular disease changes. A small, chronic right basal ganglia lacunar infarct is noted. Vascular: No hyperdense vessel or unexpected calcification. Skull: Normal.  Negative for fracture or focal lesion. Sinuses/Orbits: No acute finding. Other: Marked severity bifrontal scalp soft tissue swelling is seen along the midline, left greater than right. This extends along the vertex. An associated large, lobulated scalp soft tissue hematoma is noted IMPRESSION: 1. Marked severity frontal scalp soft tissue swelling with an associated large, lobulated scalp soft tissue hematoma. 2. No acute intracranial abnormality. 3. Small, chronic right basal ganglia lacunar infarct. 4. Generalized cerebral atrophy and microvascular disease changes of the supratentorial brain. Electronically Signed   By: Virgina Norfolk M.D.   On: 03/27/2022 21:09    Scheduled Meds:  [START ON 03/29/2022] bisacodyl  5 mg Oral QODAY   carvedilol  3.125 mg Oral BID   cholecalciferol  5,000 Units Oral Daily   demeclocycline  300 mg Oral BID   divalproex  125 mg Oral BID   enoxaparin (LOVENOX) injection  0.5 mg/kg Subcutaneous Q24H   guaiFENesin  600 mg Oral BID   hydrocerin   Topical BID   hydrocortisone cream  1 Application Topical BID   ipratropium-albuterol  3 mL Nebulization TID   levothyroxine  75 mcg Oral Q0600   montelukast  10 mg Oral QHS   multivitamin with minerals  1 tablet Oral Daily   nystatin   Topical BID   pantoprazole  40 mg Oral Daily   [START ON 03/29/2022] polyethylene glycol  17 g Oral Daily   prazosin  3 mg Oral QHS   [START ON 03/29/2022] predniSONE  40 mg Oral Q breakfast   sodium  chloride  1 g Oral TID WC   torsemide  10 mg Oral Daily   triamcinolone  1 spray Nasal Daily   venlafaxine XR  37.5 mg Oral Daily   Continuous Infusions: PRN Meds: acetaminophen **OR** acetaminophen, alum & mag hydroxide-simeth, chlorpheniramine-HYDROcodone, guaiFENesin, ipratropium-albuterol, magnesium hydroxide, morphine injection, ondansetron **OR** ondansetron (ZOFRAN) IV, senna-docusate, traZODone  Time spent: 35 minutes  Author: Val Riles. MD [facility] 03/28/2022 4:23  PM  To reach On-call, see care teams to locate the attending and reach out to them via [email or web address]. If 7PM-7AM, please contact night-coverage If you still have difficulty reaching the attending provider, please page the [facility] (Director on Call) for [facility] on amion for assistance.

## 2022-03-28 NOTE — Assessment & Plan Note (Signed)
-   We will continue PPI therapy 

## 2022-03-28 NOTE — Assessment & Plan Note (Signed)
-   We will continue her Neurontin. 

## 2022-03-28 NOTE — Consult Note (Signed)
Consultation Note Date: 03/28/2022   Patient Name: [name]  DOB: 05/06/1940  MRN: [id number]  Age / Sex: 82 y.o., female  PCP: Alvester Morin, MD Referring Physician: Val Riles, MD  Reason for Consultation: Establishing goals of care  HPI/Patient Profile: [name] is a 82 y.o. Caucasian female with medical history significant for asthma, GERD, depression, and anxiety, hypertension, dyslipidemia, osteoarthritis and CVA, who presented to the emergency room with acute onset of fall off of the commode at her SNF.  She hit her head on the corner of the wall but denied any presyncope or syncope.  No paresthesias or new focal muscle weakness.  She was noted to be hypoxic and was placed on 4 L O2 nasal cannula.  She was 85% and here on room air..  She reports chronic cough that was not worse than normal lately.  She admitted to mild dyspnea this afternoon as well as wheezing.  She is not on home O2.  There have been cases of COVID-19 in her SNF.  No lower back pain but she reported neck pain after the fall.    Clinical Assessment and Goals of Care: Notes and labs reviewed. In to see patient. She is sitting in bedside chair, no family at bedside. She states she is married. She tells me she has 2 children, Bethena Roys and a second daughter.  She states she has lived in a facility for the past 4 years. She states she has not seen or heard from her husband in 1.5 years. She states she is aware he has dementia amongst other processes, and her son in law used to bring him to see her at her facility. She states her husband lives with her second daughter, with whom she has been estranged from for the past 15 years. She states her 1st daughter Bethena Roys visits her regularly, and also does not have a relationship with the second daughter. She states Bethena Roys would be her Air traffic controller.    We discussed  her diagnoses, prognosis, GOC, EOL wishes disposition and options.  Created space and opportunity for patient  to explore thoughts and feelings regarding current medical information.   A detailed discussion was had today regarding advanced directives.  Concepts specific to code status, artifical feeding and hydration, IV antibiotics and rehospitalization were discussed.  The difference between an aggressive medical intervention path and a comfort care path was discussed.  Values and goals of care important to patient and family were attempted to be elicited.  Discussed limitations of medical interventions to prolong quality of life in some situations and discussed the concept of human mortality.  Patient states she wants any care that can be provided to keep her alive longer. She states she would be amenable to biopsy of her mass, and would be amenable to chemo/radiation as long as the prognosis would be longer than a few months from cessation of treatment.   In regards to ventilator support, she states she would be amenable for up to 2  weeks. She states she would not want tracheostomy, and would not want to live in a vegetative state. She states she does not believe she would want CPR, but states she needs to think about this, and talk with her daughter.   SUMMARY OF RECOMMENDATIONS   Continue full code/full scope at this time.  Prognosis:  Unable to determine       Primary Diagnoses: Present on Admission:  Acute respiratory failure with hypoxia ([place])  Hypothyroidism  Essential hypertension, benign  Gastroesophageal reflux disease without esophagitis   I have reviewed the medical record, interviewed the patient and family, and examined the patient. The following aspects are pertinent.  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    Depression with anxiety    GERD (gastroesophageal reflux disease)    Hemiparesis  affecting left side as late effect of cerebrovascular accident (CVA) ([place])    Hot flashes    Hyperlipidemia    Hypertension    Muscle hypertonicity    Osteoarthritis    Stroke ([place])    TIA (transient ischemic attack)    Social History   Socioeconomic History   Marital status: Married    Spouse name: Not on file   Number of children: Not on file   Years of education: Not on file   Highest education level: Not on file  Occupational History   Occupation: retired  Tobacco Use   Smoking status: Former    Packs/day: 0.50    Years: 3.00    Additional pack years: 0.00    Total pack years: 1.50    Types: Cigarettes    Quit date: 01/08/1963    Years since quitting: 59.2   Smokeless tobacco: Never  Vaping Use   Vaping Use: Never used  Substance and Sexual Activity   Alcohol use: No    Alcohol/week: 0.0 standard drinks of alcohol    Comment: does not drink alcohol   Drug use: No   Sexual activity: Not on file  Other Topics Concern   Not on file  Social History Narrative   Admitted to [facility] 11/30/2014   Married   Former smoker   Full Code   Social Determinants of Radio broadcast assistant Strain: Not on file  Food Insecurity: No Food Insecurity (03/28/2022)   Hunger Vital Sign    Worried About Running Out of Food in the Last Year: Never true    Ran Out of Food in the Last Year: Never true  Transportation Needs: No Transportation Needs (03/28/2022)   PRAPARE - Hydrologist (Medical): No    Lack of Transportation (Non-Medical): No  Physical Activity: Not on file  Stress: Not on file  Social Connections: Not on file   Family History  Problem Relation Age of Onset   Alcohol abuse Mother    Coronary artery disease Mother    Stroke Mother    Liver cancer Father    Alcohol abuse Brother    Coronary artery disease Brother    Diabetes type II Brother    Liver cancer Brother    Scheduled Meds:  [START ON 03/29/2022] bisacodyl  5  mg Oral QODAY   carvedilol  3.125 mg Oral BID   cholecalciferol  5,000 Units Oral Daily   demeclocycline  300 mg Oral BID   divalproex  125 mg Oral BID   enoxaparin (LOVENOX) injection  0.5 mg/kg Subcutaneous Q24H   guaiFENesin  600 mg Oral BID   hydrocerin   Topical BID   hydrocortisone cream  1 Application Topical BID   ipratropium-albuterol  3 mL Nebulization TID   levothyroxine  75 mcg Oral Q0600   montelukast  10 mg Oral QHS   multivitamin with minerals  1 tablet Oral Daily   nystatin   Topical BID   pantoprazole  40 mg Oral Daily   [START ON 03/29/2022] polyethylene glycol  17 g Oral Daily   prazosin  3 mg Oral QHS   [START ON 03/29/2022] predniSONE  40 mg Oral Q breakfast   sodium chloride  1 g Oral TID WC   torsemide  10 mg Oral Daily   triamcinolone  1 spray Nasal Daily   venlafaxine XR  37.5 mg Oral Daily   Continuous Infusions: PRN Meds:.acetaminophen **OR** acetaminophen, alum & mag hydroxide-simeth, chlorpheniramine-HYDROcodone, guaiFENesin, ipratropium-albuterol, magnesium hydroxide, morphine injection, ondansetron **OR** ondansetron (ZOFRAN) IV, senna-docusate, traZODone Medications Prior to Admission:  Prior to Admission medications   Medication Sig Start Date End Date Taking? Authorizing Provider  acetaminophen (TYLENOL) 500 MG tablet Take 1,000 mg by mouth every 6 (six) hours as needed (osteoarthritis of left shoulder).   Yes [provider]  alum & mag hydroxide-simeth (MAALOX PLUS) 400-400-40 MG/5ML suspension Take 30 mLs by mouth every 4 (four) hours as needed for indigestion.    Yes [provider]  bisacodyl (DULCOLAX) 5 MG EC tablet Take 5 mg by mouth every other day.   Yes [provider]  carvedilol (COREG) 3.125 MG tablet Take 3.125 mg by mouth 2 (two) times daily.    Yes [provider]  Cholecalciferol (VITAMIN D3) 125 MCG (5000 UT) CAPS Take 5,000 Units by mouth daily. 05/25/21  Yes [provider]   demeclocycline (DECLOMYCIN) 300 MG tablet Take 300 mg by mouth 2 (two) times daily.    Yes [provider]  dipyridamole-aspirin (AGGRENOX) 200-25 MG 12hr capsule Take 1 capsule by mouth every 12 (twelve) hours.    Yes [provider]  divalproex (DEPAKOTE) 125 MG DR tablet Take 125 mg by mouth 2 (two) times daily. 02/15/20  Yes [provider]  docusate sodium (DOK) 100 MG capsule Take 100 mg by mouth daily.   Yes [provider]  EUCERIN ITCH RELIEF 0.1 % LOTN Apply topically 2 (two) times daily. To dry area on left breast 02/26/21  Yes [provider]  gabapentin (NEURONTIN) 800 MG tablet Take 800 mg by mouth every 8 (eight) hours. 01/12/18  Yes [provider]  guaiFENesin (MUCINEX) 600 MG 12 hr tablet Take 2 tablets (1,200 mg total) by mouth 2 (two) times daily as needed. Home med. 07/15/21  Yes Enzo Bi, MD  hydrocortisone cream 1 % Apply 1 Application topically 2 (two) times daily. hemorrhoids   Yes [provider]  ipratropium-albuterol (DUONEB) 0.5-2.5 (3) MG/3ML SOLN Take 3 mLs by nebulization every 6 (six) hours as needed.  12/06/17  Yes [provider]  levothyroxine (SYNTHROID, LEVOTHROID) 75 MCG tablet Take 75 mcg by mouth daily before breakfast.  11/29/17  Yes [provider]  loratadine (CLARITIN) 10 MG tablet Take 10 mg by mouth daily.   Yes [provider]  montelukast (SINGULAIR) 10 MG tablet Take 10 mg by mouth at bedtime.    Yes [provider]  NYSTATIN powder Apply topically 2 (two) times daily. 06/19/21  Yes [provider]  omeprazole (PRILOSEC) 20 MG capsule  Take 20 mg by mouth in the morning.   Yes [provider]  polyethylene glycol (MIRALAX / GLYCOLAX) packet Take 17 g by mouth daily. dx: constipation  Hold if loose stool   Yes [provider]  prazosin (MINIPRESS) 1 MG capsule Take 3 mg by mouth at bedtime. 3 capsules   Yes [provider]   sennosides-docusate sodium (SENOKOT-S) 8.6-50 MG tablet Take 2 tablets by mouth 2 (two) times daily as needed for constipation. Also take 1 tab by mouth at bedtime daily. 10/31/17  Yes [provider]  Skin Protectants, Misc. (ENDIT EX) Apply liberal amount topically to areas of skin irritation as needed. Ok to leave at bedside.   Yes [provider]  sodium chloride 1 g tablet Take 1 tablet (1 g total) by mouth 3 (three) times daily with meals. 07/15/21  Yes Enzo Bi, MD  spironolactone (ALDACTONE) 50 MG tablet Take 50 mg by mouth daily.   Yes [provider]  SYSTANE BALANCE 0.6 % SOLN Place 1 drop into both eyes 2 (two) times daily. 02/20/22  Yes [provider]  torsemide (DEMADEX) 10 MG tablet Take 10 mg by mouth daily.    Yes [provider]  triamcinolone (NASACORT) 55 MCG/ACT AERO nasal inhaler Place 1 spray into the nose daily.   Yes [provider]  venlafaxine XR (EFFEXOR-XR) 37.5 MG 24 hr capsule Take 37.5 mg by mouth daily. 07/12/21  Yes [provider]  Calcium Carbonate-Vitamin D 600-400 MG-UNIT tablet Take 1 tablet by mouth 2 (two) times daily. Patient not taking: Reported on 03/28/2022    [provider]  OXYGEN O2 as needed.  Titrate to keep sat > / = 92 01/22/18   [provider]  THERATEARS 0.25 % SOLN Apply to eye. Patient not taking: Reported on 03/28/2022 02/10/20   [provider]  UNABLE TO FIND Diet Type: Regular. No Sodium Restriction. Gatorade on lunch and supper trays for hyponatremia    [provider]   Allergies  Allergen Reactions   Tizanidine Other (See Comments)    HYPOTENSION AND UNRESPONSIVENESS   Ace Inhibitors Cough   Latex Hives   Lisinopril Cough   Tetracycline Other (See Comments)     REACTION: yeast   Zanaflex [Tizanidine Hcl] Other (See Comments)    "drowsy"   Review of Systems  All other systems reviewed and are negative.   Physical Exam Skin:     Comments: Bruising to forehead.     Vital Signs: BP 139/73 (BP Location: Right Arm)   Pulse 90   Temp 97.9 F (36.6 C)   Resp 17   Ht 5\' 1"  (1.549 m)   Wt 85 kg   SpO2 98%   BMI 35.41 kg/m  Pain Scale: 0-10   Pain Score: 0-No pain   SpO2: SpO2: 98 % O2 Device:SpO2: 98 % O2 Flow Rate: .O2 Flow Rate (L/min): 4 L/min  IO: Intake/output summary:  Intake/Output Summary (Last 24 hours) at 03/28/2022 1437 Last data filed at 03/28/2022 1018 Gross per 24 hour  Intake --  Output 500 ml  Net -500 ml    LBM: Last BM Date : 03/27/22 Baseline Weight: Weight: 85 kg Most recent weight: Weight: 85 kg      Signed by: Asencion Gowda, NP   Please contact Palliative Medicine Team phone at [phone] for questions and concerns.  For individual provider: See Shea Evans

## 2022-03-28 NOTE — Evaluation (Addendum)
Occupational Therapy Evaluation Patient Details Name: [name] MRN: [id number] DOB: 17-Aug-1940 Today's Date: 03/28/2022   History of Present Illness [name] is a 82 y.o. Caucasian female with medical history significant for asthma, GERD, depression, and anxiety, hypertension, dyslipidemia, osteoarthritis and CVA, who presented to the emergency room with acute onset of fall off of the commode at her SNF   Clinical Impression   [name] was seen for OT evaluation this date. Prior to hospital admission, pt was requiring SBA for pivot t/fs. Pt lives at skilled facility. Pt presents to acute OT demonstrating impaired ADL performance and functional mobility 2/2 decreased activity tolerance and functional strength/ROM/balance deficits. Pt currently requires MIN A for BSC t/f, MAX A x2 pericare standing. TOTAL A don L AFO and B shoes bed level. Pt would benefit from skilled OT to address noted impairments and functional limitations (see below for any additional details). Upon hospital discharge, recommend therapy at facility to maximize pt safety and return to PLOF.   Recommendations for follow up therapy are one component of a multi-disciplinary discharge planning process, led by the attending physician.  Recommendations may be updated based on patient status, additional functional criteria and insurance authorization.   Follow Up Recommendations  Home health OT (rehab at [facility])    Assistance Recommended at Discharge Intermittent Supervision/Assistance  Patient can return home with the following A little help with walking and/or transfers;A little help with bathing/dressing/bathroom;Help with stairs or ramp for entrance    Functional Status Assessment  Patient has had a recent decline in their functional status and demonstrates the ability to make significant improvements in function in a reasonable and predictable amount of time.  Equipment Recommendations  None recommended by  OT    Recommendations for Other Services       Precautions / Restrictions Precautions Precautions: Fall Precaution Comments: L hemi Required Braces or Orthoses: Other Brace Other Brace: L AFO Restrictions Weight Bearing Restrictions: No      Mobility Bed Mobility Overal bed mobility: Needs Assistance Bed Mobility: Supine to Sit     Supine to sit: Max assist, +2 for physical assistance, HOB elevated          Transfers Overall transfer level: Needs assistance Equipment used: 1 person hand held assist Transfers: Sit to/from Stand, Bed to chair/wheelchair/BSC Sit to Stand: Max assist Stand pivot transfers: Min assist                Balance Overall balance assessment: Needs assistance Sitting-balance support: Feet supported, Bilateral upper extremity supported Sitting balance-Leahy Scale: Fair     Standing balance support: During functional activity, Single extremity supported Standing balance-Leahy Scale: Poor                             ADL either performed or assessed with clinical judgement   ADL Overall ADL's : Needs assistance/impaired                                       General ADL Comments: MIN A for BSC t/f, MAX A x2 pericare standing. TOTAL A don L AFO and B shoes bed level      Pertinent Vitals/Pain Pain Assessment Pain Assessment: Faces Faces Pain Scale: Hurts little more Pain Location: LUE with PROM Pain Descriptors / Indicators: Discomfort, Dull Pain Intervention(s): Limited activity within patient's  tolerance     Hand Dominance Right   Extremity/Trunk Assessment Upper Extremity Assessment Upper Extremity Assessment: LUE deficits/detail LUE Deficits / Details: chronic hemi   Lower Extremity Assessment Lower Extremity Assessment: LLE deficits/detail LLE Deficits / Details: chronic hemi, L knee does not flex   Cervical / Trunk Assessment Cervical / Trunk Assessment: Kyphotic   Communication  Communication Communication: No difficulties   Cognition Arousal/Alertness: Awake/alert Behavior During Therapy: WFL for tasks assessed/performed Overall Cognitive Status: Within Functional Limits for tasks assessed                                       General Comments  Pt on 2L/min via nasal cannula with lowest SPO2 86% but quickly increasing to >/=89%. Max HR 120 bpm            Home Living Family/patient expects to be discharged to:: Skilled nursing facility                                        Prior Functioning/Environment Prior Level of Function : Needs assist             Mobility Comments: SBA for pivot t/f, can walk with platform RW. ADLs Comments: assist for LB bathing/dressing. W/c for community mobility.        OT Problem List: Decreased strength;Decreased activity tolerance      OT Treatment/Interventions: Self-care/ADL training;Therapeutic exercise;Energy conservation;DME and/or AE instruction;Therapeutic activities;Patient/family education;Balance training    OT Goals(Current goals can be found in the care plan section) Acute Rehab OT Goals Patient Stated Goal: to go home OT Goal Formulation: With patient Time For Goal Achievement: 04/11/22 Potential to Achieve Goals: Fair ADL Goals Pt Will Perform Grooming: with set-up;with supervision;sitting Pt Will Transfer to Toilet: with modified independence;stand pivot transfer;bedside commode  OT Frequency: Min 2X/week    Co-evaluation PT/OT/SLP Co-Evaluation/Treatment: Yes Reason for Co-Treatment: Complexity of the patient's impairments (multi-system involvement);For patient/therapist safety;To address functional/ADL transfers PT goals addressed during session: Mobility/safety with mobility;Balance;Strengthening/ROM OT goals addressed during session: ADL's and self-care      AM-PAC OT "6 Clicks" Daily Activity     Outcome Measure Help from another person eating meals?:  None Help from another person taking care of personal grooming?: A Little Help from another person toileting, which includes using toliet, bedpan, or urinal?: A Lot Help from another person bathing (including washing, rinsing, drying)?: A Lot Help from another person to put on and taking off regular upper body clothing?: A Little Help from another person to put on and taking off regular lower body clothing?: A Lot 6 Click Score: 16   End of Session    Activity Tolerance: Patient tolerated treatment well Patient left: in chair;with call bell/phone within reach  OT Visit Diagnosis: Other abnormalities of gait and mobility (R26.89);Muscle weakness (generalized) (M62.81)                Time: [id number] OT Time Calculation (min): 32 min Charges:  OT General Charges $OT Visit: 1 Visit OT Evaluation $OT Eval Moderate Complexity: 1 Mod OT Treatments $Self Care/Home Management : 8-22 mins   Dessie Coma, M.S. OTR/L  03/28/22, 1:10 PM  ascom [phone]

## 2022-03-28 NOTE — Assessment & Plan Note (Signed)
-   This could be an accidental mechanical fall at her SNF. - Could be partly contributed to by her hypoxia. - PT consult to be obtained. - Management otherwise as above.

## 2022-03-28 NOTE — Assessment & Plan Note (Signed)
-   We will continue her antihypertensives. 

## 2022-03-29 DIAGNOSIS — D4989 Neoplasm of unspecified behavior of other specified sites: Secondary | ICD-10-CM | POA: Diagnosis not present

## 2022-03-29 DIAGNOSIS — J9601 Acute respiratory failure with hypoxia: Secondary | ICD-10-CM

## 2022-03-29 DIAGNOSIS — Z7189 Other specified counseling: Secondary | ICD-10-CM | POA: Diagnosis not present

## 2022-03-29 DIAGNOSIS — S0093XA Contusion of unspecified part of head, initial encounter: Secondary | ICD-10-CM | POA: Insufficient documentation

## 2022-03-29 DIAGNOSIS — S22010A Wedge compression fracture of first thoracic vertebra, initial encounter for closed fracture: Secondary | ICD-10-CM | POA: Insufficient documentation

## 2022-03-29 DIAGNOSIS — E669 Obesity, unspecified: Secondary | ICD-10-CM | POA: Insufficient documentation

## 2022-03-29 DIAGNOSIS — I10 Essential (primary) hypertension: Secondary | ICD-10-CM

## 2022-03-29 DIAGNOSIS — J45901 Unspecified asthma with (acute) exacerbation: Secondary | ICD-10-CM | POA: Diagnosis not present

## 2022-03-29 LAB — RESPIRATORY PANEL BY PCR

## 2022-03-29 LAB — CBC
HCT: 30 % — ABNORMAL LOW (ref 36.0–46.0)
Hemoglobin: 10 g/dL — ABNORMAL LOW (ref 12.0–15.0)
MCH: 30.7 pg (ref 26.0–34.0)
MCHC: 33.3 g/dL (ref 30.0–36.0)
MCV: 92 fL (ref 80.0–100.0)
Platelets: 190 10*3/uL (ref 150–400)
RBC: 3.26 MIL/uL — ABNORMAL LOW (ref 3.87–5.11)
RDW: 13.2 % (ref 11.5–15.5)
WBC: 7.5 10*3/uL (ref 4.0–10.5)
nRBC: 0 % (ref 0.0–0.2)

## 2022-03-29 LAB — PHOSPHORUS: Phosphorus: 3.4 mg/dL (ref 2.5–4.6)

## 2022-03-29 LAB — BASIC METABOLIC PANEL
Anion gap: 13 (ref 5–15)
BUN: 27 mg/dL — ABNORMAL HIGH (ref 8–23)
CO2: 29 mmol/L (ref 22–32)
Calcium: 10.6 mg/dL — ABNORMAL HIGH (ref 8.9–10.3)
Chloride: 98 mmol/L (ref 98–111)
Creatinine, Ser: 0.87 mg/dL (ref 0.44–1.00)
GFR, Estimated: >60 mL/min (ref >60)
Glucose, Bld: 98 mg/dL (ref 70–99)
Potassium: 4.3 mmol/L (ref 3.5–5.1)
Sodium: 140 mmol/L (ref 135–145)

## 2022-03-29 LAB — MAGNESIUM: Magnesium: 1.9 mg/dL (ref 1.7–2.4)

## 2022-03-29 LAB — BRAIN NATRIURETIC PEPTIDE: B Natriuretic Peptide: 60.1 pg/mL (ref 0.0–100.0)

## 2022-03-29 MED ORDER — IPRATROPIUM-ALBUTEROL 0.5-2.5 (3) MG/3ML IN SOLN: 3.0000 mL | Freq: Four times a day (QID) | RESPIRATORY_TRACT | Status: DC

## 2022-03-29 NOTE — Plan of Care (Signed)
  Problem: Health Behavior/Discharge Planning: Goal: Ability to manage health-related needs will improve Outcome: Progressing   Problem: Clinical Measurements: Goal: Ability to maintain clinical measurements within normal limits will improve Outcome: Progressing Goal: Diagnostic test results will improve Outcome: Progressing Goal: Respiratory complications will improve Outcome: Progressing Goal: Cardiovascular complication will be avoided Outcome: Progressing   Problem: Nutrition: Goal: Adequate nutrition will be maintained Outcome: Progressing   Problem: Pain Managment: Goal: General experience of comfort will improve Outcome: Progressing

## 2022-03-29 NOTE — Progress Notes (Signed)
Daily Progress Note   Patient Name: [name]       Date: 03/29/2022 DOB: 21-Feb-1940  Age: 82 y.o. MRN#: [id number] Attending Physician: Margaret Hones, MD Primary Care Physician: Margaret Morin, MD Admit Date: 03/27/2022  Reason for Consultation/Follow-up: Establishing goals of care  Subjective: Notes and labs reviewed. In to see patient. Patient is sitting in bed. Reviewed conversation from yesterday. Patient states she does not feel she would want CPR and states "when you're gone, you're gone." Discussed changing code status and providing a DNR, and she states she has not been able to talk with her daughter regarding this.  She states that her daughter was brought to the hospital and discharged home yesterday following an accident at home, and is taking pain medications that make her sleepy.  Discussed that she could be made a DNR by any provider at any time in the future.  Discussed having this conversation with her daughter and making changes to care moving forward when she is ready.  Length of Stay: 2  Current Medications: Scheduled Meds:   azithromycin  500 mg Oral Q24H   bisacodyl  5 mg Oral QODAY   carvedilol  3.125 mg Oral BID   cholecalciferol  5,000 Units Oral Daily   demeclocycline  300 mg Oral BID   divalproex  125 mg Oral BID   enoxaparin (LOVENOX) injection  0.5 mg/kg Subcutaneous Q24H   fluticasone furoate-vilanterol  1 puff Inhalation Daily   guaiFENesin  600 mg Oral BID   hydrocerin   Topical BID   hydrocortisone cream  1 Application Topical BID   levothyroxine  75 mcg Oral Q0600   montelukast  10 mg Oral QHS   multivitamin with minerals  1 tablet Oral Daily   nystatin   Topical BID   pantoprazole  40 mg Oral Daily   polyethylene glycol  17 g Oral  Daily   prazosin  3 mg Oral QHS   predniSONE  40 mg Oral Q breakfast   sodium chloride  1 g Oral TID WC   torsemide  10 mg Oral Daily   triamcinolone  1 spray Nasal Daily   venlafaxine XR  37.5 mg Oral Daily    Continuous Infusions:   PRN Meds: acetaminophen **OR** acetaminophen, alum & mag hydroxide-simeth, chlorpheniramine-HYDROcodone, guaiFENesin, hydrALAZINE **OR** hydrALAZINE,  ipratropium-albuterol, magnesium hydroxide, morphine injection, ondansetron **OR** ondansetron (ZOFRAN) IV, senna-docusate, traZODone  Physical Exam Pulmonary:     Effort: Pulmonary effort is normal.  Neurological:     Mental Status: She is alert.             Vital Signs: BP 133/67 (BP Location: Right Arm)   Pulse 94   Temp 98 F (36.7 C) (Oral)   Resp 16   Ht 5\' 1"  (1.549 m)   Wt 84 kg   SpO2 96%   BMI 34.99 kg/m  SpO2: SpO2: 96 % O2 Device: O2 Device: Room Air O2 Flow Rate: O2 Flow Rate (L/min):  (3)  Intake/output summary:  Intake/Output Summary (Last 24 hours) at 03/29/2022 1129 Last data filed at 03/29/2022 1020 Gross per 24 hour  Intake --  Output 250 ml  Net -250 ml   LBM: Last BM Date : 03/28/22 Baseline Weight: Weight: 85 kg Most recent weight: Weight: 84 kg   Patient Active Problem List   Diagnosis Date Noted   T12 compression fracture, initial encounter ([place]) 03/29/2022   Asthma, chronic, unspecified asthma severity, with acute exacerbation 03/28/2022   Fall 03/28/2022   Thymoma 03/28/2022   Acute respiratory failure with hypoxia ([facility]) 03/27/2022   Hyponatremia 07/14/2021   Pain and swelling of lower leg 03/12/2020   Lymphedema 03/12/2020   DJD (degenerative joint disease) 03/12/2020   Acute bronchitis 11/28/2017   Acute renal failure ([facility]CC) 08/27/2017   Bilateral lower extremity edema 07/14/2017   Bilateral chronic knee pain 07/14/2017   Onychomycosis 02/12/2017   SIADH (syndrome of inappropriate ADH production) ([place]) 01/07/2017   Hemiplegia and hemiparesis  following cerebral infarction affecting left non-dominant side ([place]) 05/29/2016   Other sequelae of cerebral infarction 05/29/2016   Pain of left leg 05/29/2016   Pain of left arm 05/29/2016   Major depression, single episode [id number]   Uncomplicated asthma [id number]   Nontoxic thyroid nodule 05/29/2016   Flexion deformity, left ankle and toes 05/29/2016   Contracture, left hand 05/29/2016   Lack of coordination 05/29/2016   Osteoarthritis of left shoulder 05/29/2016   Chronic non-seasonal allergic rhinitis 05/29/2016   Peripheral neuropathy 05/29/2016   Overactive bladder 05/29/2016   Corn of toe 05/29/2016   Dry eyes 05/29/2016   Posttraumatic stress disorder 07/21/2015   Essential hypertension, benign 07/21/2015   Hypermyotonia 07/21/2015   HCAP (healthcare-associated pneumonia) 11/19/2014   Chronic hyponatremia 11/18/2014   Mixed incontinence 09/30/2014   Pure hypercholesterolemia 10/22/2013   Cerebral artery occlusion with cerebral infarction ([place]) 04/04/2009   Gastroesophageal reflux disease without esophagitis 10/13/2007   Hypothyroidism 07/23/2006   Hyperlipidemia 07/23/2006   ROSACEA 07/23/2006   Osteopenia 07/23/2006    Palliative Care Assessment & Plan    Recommendations/Plan:  Patient states she believes she would want a DNR status, but has not been able to speak with her daughter about this decision, as her daughter had an accident and was discharged from the hospital yesterday.    Discussed that she could be made a DNR and provided the form by a provider at a later time when she is ready to do so.  Code Status:    Code Status Orders  (From admission, onward)           Start     Ordered   03/27/22 2249  Full code  Continuous       Question:  By:  Answer:  Consent: discussion documented in EHR   03/27/22 2253  Code Status History     Date Active Date Inactive Code Status Order ID Comments User Context   07/15/2021 0003 07/15/2021 1954  Full Code [id number]  Leslee Home, DO ED   11/19/2014 0149 11/30/2014 1850 Full Code [id number]  Saundra Shelling, MD Inpatient       Prognosis:  Unable to determine   Thank you for allowing the Palliative Medicine Team to assist in the care of this patient.    Margaret Gowda, NP  Please contact Palliative Medicine Team phone at [phone] for questions and concerns.

## 2022-03-29 NOTE — Consult Note (Signed)
[place]  [phone] [phone]  ID: [name] OB: Sep 03, 1940  MR#: [id number]  [id number]  Patient Care Team: Alvester Morin, MD as PCP - General (Family Medicine) Nyoka Cowden Phylis Bougie, NP as Nurse Practitioner (Geriatric Medicine)  CHIEF COMPLAINT: Mediastinal mass  INTERVAL HISTORY: Patient is an 82 year old female who was admitted to the hospital after sustaining a fall at her assisted living facility.  Subsequent workup included CT scan which noted a mediastinal mass that has been present since at least 2016, but had enlarged.  She has multiple ecchymoses across her face and eyes.  She has no neurologic complaints.  She denies any recent fevers or illnesses.  She has a fair appetite and denies weight loss.  She has no chest pain, shortness of breath, cough, or hemoptysis.  She denies any nausea, vomiting, constipation, or diarrhea.  She has no urinary complaints.  Patient otherwise feels well and offers no further specific complaints today.  REVIEW OF SYSTEMS:   Review of Systems  Constitutional:  Negative for fever, malaise/fatigue and weight loss.  Respiratory: Negative.  Negative for cough, hemoptysis and shortness of breath.   Cardiovascular:  Negative for chest pain, palpitations and leg swelling.  Gastrointestinal: Negative.  Negative for abdominal pain.  Genitourinary: Negative.  Negative for dysuria.  Musculoskeletal:  Positive for falls. Negative for back pain.  Skin: Negative.  Negative for rash.  Neurological:  Positive for weakness. Negative for dizziness, focal weakness and headaches.  Psychiatric/Behavioral: Negative.  The patient is not nervous/anxious.     As per HPI. Otherwise, a complete review of systems is negative.  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    Depression with anxiety    GERD  (gastroesophageal reflux disease)    Hemiparesis affecting left side as late effect of cerebrovascular accident (CVA) ([place])    Hot flashes    Hyperlipidemia    Hypertension    Muscle hypertonicity    Osteoarthritis    Stroke ([place])    TIA (transient ischemic attack)     PAST SURGICAL HISTORY: Past Surgical History:  Procedure Laterality Date   ACHILLES TENDON LENGTHENING  08/25/2015   Procedure: ACHILLES TENDON LENGTHENING;  Surgeon: Samara Deist, DPM;  Location: [facility];  Service: Podiatry;;   APPENDECTOMY     COLONOSCOPY  06/17/2008   EYE SURGERY Bilateral    Cataract Extraction with IOL   FLAT FOOT RECONSTRUCTION-TAL GASTROC RECESSION Left 08/25/2015   Procedure: ENDOSCOPIC FLAT FOOT RECONSTRUCTION-TAL GASTROC RECESSION;  Surgeon: Samara Deist, DPM;  Location: [facility];  Service: Podiatry;  Laterality: Left;   VAGINAL HYSTERECTOMY      FAMILY HISTORY: Family History  Problem Relation Age of Onset   Alcohol abuse Mother    Coronary artery disease Mother    Stroke Mother    Liver cancer Father    Alcohol abuse Brother    Coronary artery disease Brother    Diabetes type II Brother    Liver cancer Brother     ADVANCED DIRECTIVES (Y/N):  @ADVDIR @  HEALTH MAINTENANCE: Social History   Tobacco Use   Smoking status: Former    Packs/day: 0.50    Years: 3.00    Additional pack years: 0.00    Total pack years: 1.50    Types: Cigarettes    Quit date: 01/08/1963    Years since quitting: 59.2   Smokeless  tobacco: Never  Vaping Use   Vaping Use: Never used  Substance Use Topics   Alcohol use: No    Alcohol/week: 0.0 standard drinks of alcohol    Comment: does not drink alcohol   Drug use: No     Colonoscopy:  PAP:  Bone density:  Lipid panel:  Allergies  Allergen Reactions   Tizanidine Other (See Comments)    HYPOTENSION AND UNRESPONSIVENESS   Ace Inhibitors Cough   Latex Hives   Lisinopril Cough   Tetracycline Other (See Comments)     REACTION: yeast    Zanaflex [Tizanidine Hcl] Other (See Comments)    "drowsy"    Current Facility-Administered Medications  Medication Dose Route Frequency Provider Last Rate Last Admin   acetaminophen (TYLENOL) tablet 650 mg  650 mg Oral Q6H PRN Mansy, Jan A, MD       Or   acetaminophen (TYLENOL) suppository 650 mg  650 mg Rectal Q6H PRN Mansy, Jan A, MD       alum & mag hydroxide-simeth (MAALOX/MYLANTA) 200-200-20 MG/5ML suspension 30 mL  30 mL Oral Q4H PRN Mansy, Jan A, MD       azithromycin (ZITHROMAX) tablet 500 mg  500 mg Oral Q24H Val Riles, MD   500 mg at 03/28/22 1719   bisacodyl (DULCOLAX) EC tablet 5 mg  5 mg Oral Melvenia Needles, MD   5 mg at 03/29/22 1019   carvedilol (COREG) tablet 3.125 mg  3.125 mg Oral BID Mansy, Jan A, MD   3.125 mg at 03/29/22 1018   chlorpheniramine-HYDROcodone (TUSSIONEX) 10-8 MG/5ML suspension 5 mL  5 mL Oral Q12H PRN Mansy, Jan A, MD       cholecalciferol (VITAMIN D3) 25 MCG (1000 UNIT) tablet 5,000 Units  5,000 Units Oral Daily Mansy, Jan A, MD   5,000 Units at 03/29/22 1017   demeclocycline (DECLOMYCIN) tablet 300 mg  300 mg Oral BID Mansy, Jan A, MD   300 mg at 03/29/22 1051   divalproex (DEPAKOTE) DR tablet 125 mg  125 mg Oral BID Mansy, Jan A, MD   125 mg at 03/29/22 1021   enoxaparin (LOVENOX) injection 42.5 mg  0.5 mg/kg Subcutaneous Q24H Mansy, Jan A, MD   42.5 mg at 03/29/22 1020   fluticasone furoate-vilanterol (BREO ELLIPTA) 200-25 MCG/ACT 1 puff  1 puff Inhalation Daily Val Riles, MD   1 puff at 03/29/22 1036   guaiFENesin (MUCINEX) 12 hr tablet 1,200 mg  1,200 mg Oral BID PRN Mansy, Jan A, MD       guaiFENesin (MUCINEX) 12 hr tablet 600 mg  600 mg Oral BID Mansy, Jan A, MD   600 mg at 03/29/22 1019   hydrALAZINE (APRESOLINE) injection 10 mg  10 mg Intravenous Q6H PRN Val Riles, MD       Or   hydrALAZINE (APRESOLINE) tablet 50 mg  50 mg Oral Q6H PRN Val Riles, MD       hydrocerin (EUCERIN) cream   Topical BID Mansy, Jan A, MD   1  Application at [id number]   hydrocortisone cream 1 % 1 Application  1 Application Topical BID Mansy, Jan A, MD   1 Application at [id number]   ipratropium-albuterol (DUONEB) 0.5-2.5 (3) MG/3ML nebulizer solution 3 mL  3 mL Nebulization Q6H PRN Mansy, Jan A, MD       ipratropium-albuterol (DUONEB) 0.5-2.5 (3) MG/3ML nebulizer solution 3 mL  3 mL Nebulization Q6H Sharen Hones, MD       levothyroxine (SYNTHROID) tablet  75 mcg  75 mcg Oral Q0600 Mansy, Jan A, MD   75 mcg at 03/29/22 0542   magnesium hydroxide (MILK OF MAGNESIA) suspension 30 mL  30 mL Oral Daily PRN Mansy, Jan A, MD       montelukast (SINGULAIR) tablet 10 mg  10 mg Oral QHS Mansy, Jan A, MD   10 mg at 03/28/22 2221   morphine (PF) 2 MG/ML injection 1-2 mg  1-2 mg Intravenous Q4H PRN Mansy, Jan A, MD   2 mg at 03/28/22 0211   multivitamin with minerals tablet 1 tablet  1 tablet Oral Daily Val Riles, MD   1 tablet at 03/29/22 1250   nystatin (MYCOSTATIN/NYSTOP) topical powder   Topical BID Mansy, Jan A, MD   Given at 03/29/22 1053   ondansetron (ZOFRAN) tablet 4 mg  4 mg Oral Q6H PRN Mansy, Jan A, MD       Or   ondansetron [facility]) injection 4 mg  4 mg Intravenous Q6H PRN Mansy, Jan A, MD       pantoprazole (PROTONIX) EC tablet 40 mg  40 mg Oral Daily Mansy, Jan A, MD   40 mg at 03/29/22 1020   polyethylene glycol (MIRALAX / GLYCOLAX) packet 17 g  17 g Oral Daily Val Riles, MD   17 g at 03/29/22 1021   prazosin (MINIPRESS) capsule 3 mg  3 mg Oral QHS Mansy, Jan A, MD   3 mg at 03/28/22 2232   predniSONE (DELTASONE) tablet 40 mg  40 mg Oral Q breakfast Mansy, Jan A, MD   40 mg at 03/29/22 1018   senna-docusate (Senokot-S) tablet 2 tablet  2 tablet Oral BID PRN Mansy, Jan A, MD       sodium chloride tablet 1 g  1 g Oral TID WC Mansy, Jan A, MD   1 g at 03/29/22 1250   torsemide (DEMADEX) tablet 10 mg  10 mg Oral Daily Mansy, Jan A, MD   10 mg at 03/29/22 1018   traZODone (DESYREL) tablet 25 mg  25 mg Oral QHS PRN Mansy, Jan  A, MD   25 mg at 03/28/22 0128   triamcinolone (NASACORT) nasal inhaler 1 spray  1 spray Nasal Daily Mansy, Jan A, MD   1 spray at 03/29/22 1043   venlafaxine XR (EFFEXOR-XR) 24 hr capsule 37.5 mg  37.5 mg Oral Daily Mansy, Jan A, MD   37.5 mg at 03/29/22 1020    OBJECTIVE: Vitals:   03/29/22 0738 03/29/22 1000  BP:  133/67  Pulse:  94  Resp:  16  Temp:  98 F (36.7 C)  SpO2: 97% 96%     Body mass index is 34.99 kg/m.    ECOG FS:3 - Symptomatic, >50% confined to bed  General: Well-developed, well-nourished, no acute distress. Eyes: Pink conjunctiva, anicteric sclera. HEENT: Large ecchymoses across both orbits. Lungs: No audible wheezing or coughing. Heart: Regular rate and rhythm. Abdomen: Soft, nontender, no obvious distention. Musculoskeletal: No edema, cyanosis, or clubbing. Neuro: Alert, answering all questions appropriately. Cranial nerves grossly intact. Skin: No rashes or petechiae noted. Psych: Normal affect.   LAB RESULTS:  Lab Results  Component Value Date   NA 140 03/29/2022   K 4.3 03/29/2022   CL 98 03/29/2022   CO2 29 03/29/2022   GLUCOSE 98 03/29/2022   BUN 27 (H) 03/29/2022   CREATININE 0.87 03/29/2022   CALCIUM 10.6 (H) 03/29/2022   PROT 6.8 07/14/2021   ALBUMIN 3.6 07/14/2021   AST 17  07/14/2021   ALT 14 07/14/2021   ALKPHOS 76 07/14/2021   BILITOT 0.8 07/14/2021   GFRNONAA >60 03/29/2022   GFRAA >60 02/17/2018    Lab Results  Component Value Date   WBC 7.5 03/29/2022   NEUTROABS 4.9 07/14/2021   HGB 10.0 (L) 03/29/2022   HCT 30.0 (L) 03/29/2022   MCV 92.0 03/29/2022   PLT 190 03/29/2022     STUDIES: CT Angio Chest PE W and/or Wo Contrast  Result Date: 03/27/2022 CLINICAL DATA:  Pulmonary embolism (PE) suspected, high prob. Chest pain EXAM: CT ANGIOGRAPHY CHEST WITH CONTRAST TECHNIQUE: Multidetector CT imaging of the chest was performed using the standard protocol during bolus administration of intravenous contrast. Multiplanar CT  image reconstructions and MIPs were obtained to evaluate the vascular anatomy. RADIATION DOSE REDUCTION: This exam was performed according to the departmental dose-optimization program which includes automated exposure control, adjustment of the mA and/or kV according to patient size and/or use of iterative reconstruction technique. CONTRAST:  37mL OMNIPAQUE IOHEXOL 350 MG/ML SOLN COMPARISON:  11/23/2014 FINDINGS: Cardiovascular: There is adequate opacification of the pulmonary arterial tree. No intraluminal filling defect identified through the segmental level to suggest acute pulmonary embolism. The central pulmonary arteries are of normal caliber. Mild multi-vessel coronary artery calcification. Mild global cardiomegaly, stable. Small pericardial effusion is unchanged. Mild atherosclerotic calcification within the thoracic aorta. No aortic aneurysm. Mediastinum/Nodes: 4.4 x 4.6 cm left thyroid mass appears stable since prior examination, not optimally assessed but likely benign given its stability over time. This was previously assessed on sonogram of 11/24/2014. In the setting of significant comorbidities or limited life expectancy, no follow-up recommended (ref: J Am Coll Radiol. 2015 Feb;12(2): 143-50).Anterior mediastinal mass has enlarged in the interval since prior examination measuring 2.8 x 3.7 cm at axial image # 55/4, previously measuring 0.7 x 1.2 cm, most in keeping with a thymoma or lymphoma. Stable mass effect upon the proximal esophagus by the thyroid mass. The esophagus is otherwise unremarkable. Moderate hiatal hernia. Lungs/Pleura: Bilateral dependent atelectasis. Ground-glass opacities within the lung apices likely represent atelectasis in this extra tori phase examination. There is marked narrowing of the mainstem bronchi and bronchus intermedius in the AP dimension in keeping with changes of bronchomalacia. No pneumothorax or pleural effusion. Upper Abdomen: Stable 2.4 cm left adrenal  adenoma. No follow-up imaging is recommended for this lesion. No acute abnormality within the visualized abdomen. Musculoskeletal: Interval development of an age indeterminate compression deformity of T1 with approximately 30-40% loss of height. Stable anterior wedge compression deformity of T2. Healed mid sternal fracture noted, new from prior examination. Osseous structures are otherwise age-appropriate. Review of the MIP images confirms the above findings. IMPRESSION: 1. No pulmonary embolism. 2. Mild multi-vessel coronary artery calcification. Stable mild global cardiomegaly. Stable small pericardial effusion. 3. Bronchomalacia involving the mainstem bronchi bilaterally and bronchus intermedius. 4. Interval enlargement of a anterior mediastinal mass most in keeping with a thymoma or lymphoma. Surgical consultation may be helpful for further management. 5. Stable 4.6 cm left thyroid mass, not optimally assessed but likely benign given its stability over time. This was previously assessed on sonogram of 11/24/2014. In the setting of significant clinical comorbidity or limited life expectancy, no follow-up imaging is recommended. 6. Moderate hiatal hernia. 7. Stable 2.4 cm left adrenal adenoma. 8. Interval development of an age indeterminate compression deformity of T1 with approximately 30-40% loss of height. Correlation for point tenderness is recommended. If indicated, this could be better assessed with MRI examination. Aortic Atherosclerosis (ICD10-I70.0).  Electronically Signed   By: Fidela Salisbury M.D.   On: 03/27/2022 22:58   DG Knee Complete 4 Views Right  Result Date: 03/27/2022 CLINICAL DATA:  Fall and trauma to the right knee. EXAM: RIGHT KNEE - COMPLETE 4+ VIEW COMPARISON:  None Available. FINDINGS: No acute fracture or dislocation. The bones are osteopenic. Mild arthritic changes and tricompartmental narrowing. No significant joint effusion. The soft tissues are unremarkable. IMPRESSION: 1. No acute  fracture or dislocation. 2. Mild arthritic changes. Electronically Signed   By: Anner Crete M.D.   On: 03/27/2022 21:29   DG Knee Complete 4 Views Left  Result Date: 03/27/2022 CLINICAL DATA:  Fall with bilateral knee pain. EXAM: LEFT KNEE - COMPLETE 4 VIEW COMPARISON:  None Available. FINDINGS: No acute fracture or subluxation. A small joint effusion is possible. Generalized osteopenia. Undulating appearance of the lateral femoral condyle which appears chronic on the frontal view. Generalized osteopenia. IMPRESSION: No acute finding. Electronically Signed   By: Jorje Guild M.D.   On: 03/27/2022 21:29   DG Chest 1 View  Result Date: 03/27/2022 CLINICAL DATA:  Chest pain EXAM: CHEST  1 VIEW COMPARISON:  07/14/2021 FINDINGS: Right diaphragm appears slightly elevated. Linear atelectasis or scar at the left base. Mild cardiomegaly with aortic atherosclerosis. No pleural effusion or pneumothorax. IMPRESSION: Mild cardiomegaly. Linear atelectasis or scar at the left base. Electronically Signed   By: Donavan Foil M.D.   On: 03/27/2022 21:28   DG Forearm Left  Result Date: 03/27/2022 CLINICAL DATA:  Contracture.  Fall. EXAM: LEFT FOREARM - 2 VIEW COMPARISON:  None Available. FINDINGS: Wrist and hand positioning correlating with history of contracture. Generalized osteopenia. No acute fracture or dislocation. IMPRESSION: No acute finding. Electronically Signed   By: Jorje Guild M.D.   On: 03/27/2022 21:27   CT Cervical Spine Wo Contrast  Result Date: 03/27/2022 CLINICAL DATA:  Blunt facial trauma. EXAM: CT CERVICAL SPINE WITHOUT CONTRAST TECHNIQUE: Multidetector CT imaging of the cervical spine was performed without intravenous contrast. Multiplanar CT image reconstructions were also generated. RADIATION DOSE REDUCTION: This exam was performed according to the departmental dose-optimization program which includes automated exposure control, adjustment of the mA and/or kV according to patient  size and/or use of iterative reconstruction technique. COMPARISON:  None Available. FINDINGS: Alignment: Exaggerated kyphosis at the cervicothoracic junction. Skull base and vertebrae: T1 body fracture with horizontal fracture plane and impaction. Trace retropulsion. The fracture could be subacute given the indistinct margins. Remote T2 compression fracture with similar mild to moderate height loss. No acute cervical spine fracture. Soft tissues and spinal canal: Nodule in the left lobe thyroid measuring at least 5.2 cm. This was evaluated by ultrasound 11/24/2014 with biopsy recommended. No worrisome growth given the long interval. No follow-up recommended unless clinically warranted (ref: J Am Coll Radiol. 2015 Feb;12(2): 143-50). Disc levels:  Ordinary degenerative changes Upper chest: No acute finding. IMPRESSION: 1. T1 compression fracture which could be subacute to chronic. Remote T2 compression fracture. 2. Negative for cervical spine fracture or subluxation. Electronically Signed   By: Jorje Guild M.D.   On: 03/27/2022 21:19   CT Head Wo Contrast  Result Date: 03/27/2022 CLINICAL DATA:  Status post trauma. EXAM: CT HEAD WITHOUT CONTRAST TECHNIQUE: Contiguous axial images were obtained from the base of the skull through the vertex without intravenous contrast. RADIATION DOSE REDUCTION: This exam was performed according to the departmental dose-optimization program which includes automated exposure control, adjustment of the mA and/or kV according to patient size  and/or use of iterative reconstruction technique. COMPARISON:  November 26, 2014 FINDINGS: Brain: There is moderate severity cerebral atrophy with widening of the extra-axial spaces and ventricular dilatation. There are areas of decreased attenuation within the white matter tracts of the supratentorial brain, consistent with microvascular disease changes. A small, chronic right basal ganglia lacunar infarct is noted. Vascular: No hyperdense  vessel or unexpected calcification. Skull: Normal. Negative for fracture or focal lesion. Sinuses/Orbits: No acute finding. Other: Marked severity bifrontal scalp soft tissue swelling is seen along the midline, left greater than right. This extends along the vertex. An associated large, lobulated scalp soft tissue hematoma is noted IMPRESSION: 1. Marked severity frontal scalp soft tissue swelling with an associated large, lobulated scalp soft tissue hematoma. 2. No acute intracranial abnormality. 3. Small, chronic right basal ganglia lacunar infarct. 4. Generalized cerebral atrophy and microvascular disease changes of the supratentorial brain. Electronically Signed   By: Virgina Norfolk M.D.   On: 03/27/2022 21:09    ASSESSMENT: Mediastinal mass.  PLAN:    Mediastinal mass: Previously mediastinal mass was assessed by sonogram in 2016 and noted to be 0.7 x 1.2 cm.  Since that time it has enlarged to 2.8 x 3.7.  Per interventional radiology, mass is not something accessible percutaneously and patient would require a mediastinoscopy for diagnosis.  Given her multiple comorbidities and the slow growth of the lesion, agree that no biopsy is necessary at this time.  Plus, if malignant patient likely would not be able to tolerate treatment.  No intervention is needed from an oncology standpoint.  Case discussed with patient's daughter, Almyra Free. Recommend patient's primary care repeat CT scan in 1 year to assess for interval change.  Appreciate consult, will follow.   Lloyd Huger, MD   03/29/2022 3:14 PM

## 2022-03-29 NOTE — Plan of Care (Signed)
  Problem: Education: Goal: Knowledge of General Education information will improve Description: Including pain rating scale, medication(s)/side effects and non-pharmacologic comfort measures Outcome: Progressing   Problem: Pain Managment: Goal: General experience of comfort will improve Outcome: Progressing   Problem: Elimination: Goal: Will not experience complications related to bowel motility Outcome: Progressing   Problem: Pain Managment: Goal: General experience of comfort will improve Outcome: Progressing

## 2022-03-29 NOTE — Progress Notes (Signed)
       CROSS COVER NOTE  NAME: [name] MRN: [id number] DOB : 07/07/1940    HPI/Events of Note   Report:HR 140, SR  On review of chart: Patient admitted after falling at SNF. In ED found to have acute on chronic resp failure with hypoxia T1T2 compression fracture ? Chronicity, prior CVA with left hemiparesis   Assessment and  Interventions   Assessment: EKG ST rate 110 Plan: Continue to monitor on tele Check mag with this am labs Replete K goal above 4, mag goal above 2       Kathlene Cote NP [facility]

## 2022-03-29 NOTE — Progress Notes (Signed)
  Progress Note   Patient: [name] [id number] DOB: 1940/11/06 DOA: 03/27/2022     2 DOS: the patient was seen and examined on 03/29/2022   Brief hospital course: [name] is a 82 y.o. Caucasian female with medical history significant for asthma, GERD, depression, and anxiety, hypertension, dyslipidemia, osteoarthritis and CVA, who presented to the emergency room with acute onset of fall off of the commode at her SNF.  Patient was found to have 85% on room air.  Respiratory failure due to asthma exacerbation.  CT head showed severe frontal scalp soft tissue swelling and scalp subcu hematoma. CT C-spine showed a T1 compression fracture. CT chest showed a stable left lower thyroid mass 2.4 cm left adrenal adenoma, midsternal mass which is larger than 2016. Patient is placed on steroids, bronchodilators for asthma exacerbation.  Consult from oncology obtained.   Principal Problem:   Acute respiratory failure with hypoxia (HCC) Active Problems:   Fall   Hypothyroidism   Essential hypertension, benign   Gastroesophageal reflux disease without esophagitis   Peripheral neuropathy   Hemiparesis affecting left side as late effect of cerebrovascular accident (CVA) (HCC)   Asthma, chronic, unspecified asthma severity, with acute exacerbation   Thymoma   T12 compression fracture, initial encounter ([place])   Head contusion   Assessment and Plan: * Acute respiratory failure with hypoxia ([place]) acute asthma exacerbation. Patient still requiring oxygen, no signet bronchospasm today.  Continue steroids, added DuoNebs, continue to follow closely.  Patient does not have volume overload.  Mediastinal mass most likely due to thymoma. Thyroid mass. Adrenal mass. Patient has been seen by oncology, they will follow as outpatient.   Fall T1 compression fracture. Head contusion. Continue symptomatic treatment, patient had frequent fall, weakness, will need nursing home  placement.  Left hemiparesis secondary to old CVA. Continue PT/OT.   Hypothyroidism continue Synthroid.  Essential hypertension, benign continue her antihypertensives.  Gastroesophageal reflux disease without esophagitis  continue PPI therapy.  Peripheral neuropathy - We will continue her Neurontin.       Subjective:  Patient is still on 2 L oxygen, short of breath with exertion.  Physical Exam: Vitals:   03/29/22 0404 03/29/22 0419 03/29/22 0738 03/29/22 1000  BP: (!) 107/56   133/67  Pulse: 98   94  Resp: 18   16  Temp: 98.1 F (36.7 C)   98 F (36.7 C)  TempSrc:    Oral  SpO2: 96%  97% 96%  Weight:  84 kg    Height:       General exam: Appears calm and comfortable  Respiratory system: Clear to auscultation. Respiratory effort normal. Cardiovascular system: S1 & S2 heard, RRR. No JVD, murmurs, rubs, gallops or clicks. No pedal edema. Gastrointestinal system: Abdomen is nondistended, soft and nontender. No organomegaly or masses felt. Normal bowel sounds heard. Central nervous system: Alert and oriented x3. No focal neurological deficits. Extremities: Symmetric 5 x 5 power. Skin: No rashes, lesions or ulcers Psychiatry: Judgement and insight appear normal. Mood & affect appropriate.    Data Reviewed:  CT scan results, lab results reviewed.  Family Communication: daughter updated  Disposition: Status is: Inpatient Remains inpatient appropriate because: severity of disease     Time spent: 35 minutes  Author: Sharen Hones, MD 03/29/2022 2:54 PM  For on call review [email or web address].

## 2022-03-29 NOTE — Hospital Course (Signed)
ancy J [address] [name] is a 82 y.o. Caucasian female with medical history significant for asthma, GERD, depression, and anxiety, hypertension, dyslipidemia, osteoarthritis and CVA, who presented to the emergency room with acute onset of fall off of the commode at her SNF.  Patient was found to have 85% on room air.  Respiratory failure due to asthma exacerbation.  CT head showed severe frontal scalp soft tissue swelling and scalp subcu hematoma. CT C-spine showed a T1 compression fracture. CT chest showed a stable left lower thyroid mass 2.4 cm left adrenal adenoma, midsternal mass which is larger than 2016. Patient is placed on steroids, bronchodilators for asthma exacerbation.  Consult from oncology obtained.

## 2022-03-29 NOTE — Progress Notes (Signed)
Patient had episode of afib as recorded in cardiac monitor, patient is shivering because the room is too cold as the author enter the room, patient was advised to take a deep breath and VS is taken, B.Morrison notified and EKG was ordered and done. Will continue to monitor.

## 2022-03-29 NOTE — TOC Initial Note (Signed)
Transition of Care [facility]) - Initial/Assessment Note    Patient Details  Name: [name] MRN: [id number] Date of Birth: [birth year]  Transition of Care [facility]) CM/SW Contact:    Laurena Slimmer, RN Phone Number: 03/29/2022, 3:29 PM  Clinical Narrative:                 Damaris Schooner with [facility] in admissions. Patient was there long term.        Patient Goals and CMS Choice            Expected Discharge Plan and Services                                              Prior Living Arrangements/Services                       Activities of Daily Living Home Assistive Devices/Equipment: Wheelchair ADL Screening (condition at time of admission) Patient's cognitive ability adequate to safely complete daily activities?: Yes Is the patient deaf or have difficulty hearing?: No Does the patient have difficulty seeing, even when wearing glasses/contacts?: No Does the patient have difficulty concentrating, remembering, or making decisions?: No Patient able to express need for assistance with ADLs?: Yes Does the patient have difficulty dressing or bathing?: Yes Independently performs ADLs?: No Does the patient have difficulty walking or climbing stairs?: Yes Weakness of Legs: Both Weakness of Arms/Hands: Both  Permission Sought/Granted                  Emotional Assessment              Admission diagnosis:  Acute respiratory failure with hypoxia ([place]) [J96.01] Fall, initial encounter [W19.XXXA] Patient Active Problem List   Diagnosis Date Noted   Head contusion 03/29/2022   Traumatic compression fracture of T1 vertebra ([place]) 03/29/2022   Obesity (BMI 30-39.9) 03/29/2022   Asthma, chronic, unspecified asthma severity, with acute exacerbation 03/28/2022   Fall 03/28/2022   Thymoma 03/28/2022   Acute respiratory failure with hypoxia ([place]) 03/27/2022   Hyponatremia 07/14/2021   Pain and swelling of lower leg 03/12/2020   Lymphedema 03/12/2020    DJD (degenerative joint disease) 03/12/2020   Acute bronchitis 11/28/2017   Acute renal failure ([place]) 08/27/2017   Bilateral lower extremity edema 07/14/2017   Bilateral chronic knee pain 07/14/2017   Onychomycosis 02/12/2017   SIADH (syndrome of inappropriate ADH production) ([place]) 01/07/2017   Hemiparesis affecting left side as late effect of cerebrovascular accident (CVA) ([place]) 05/29/2016   Other sequelae of cerebral infarction 05/29/2016   Pain of left leg 05/29/2016   Pain of left arm 05/29/2016   Major depression, single episode [id number]   Uncomplicated asthma [id number]   Nontoxic thyroid nodule 05/29/2016   Flexion deformity, left ankle and toes 05/29/2016   Contracture, left hand 05/29/2016   Lack of coordination 05/29/2016   Osteoarthritis of left shoulder 05/29/2016   Chronic non-seasonal allergic rhinitis 05/29/2016   Peripheral neuropathy 05/29/2016   Overactive bladder 05/29/2016   Corn of toe 05/29/2016   Dry eyes 05/29/2016   Posttraumatic stress disorder 07/21/2015   Essential hypertension, benign 07/21/2015   Hypermyotonia 07/21/2015   HCAP (healthcare-associated pneumonia) 11/19/2014   Chronic hyponatremia 11/18/2014   Mixed incontinence 09/30/2014   Pure hypercholesterolemia 10/22/2013   Cerebral artery occlusion with cerebral infarction ([place]) 04/04/2009   Gastroesophageal  reflux disease without esophagitis 10/13/2007   Hypothyroidism 07/23/2006   Hyperlipidemia 07/23/2006   ROSACEA 07/23/2006   Osteopenia 07/23/2006   PCP:  Alvester Morin, MD Pharmacy:   [facility] [place], [place] - [address] [place] [address] Phone: [phone] Fax: [phone]     Social Determinants of Health (SDOH) Social History: SDOH Screenings   Food Insecurity: No Food Insecurity (03/28/2022)  Housing: Low Risk  (03/28/2022)  Transportation Needs: No Transportation Needs (03/28/2022)  Utilities: Not At  Risk (03/28/2022)  Tobacco Use: Medium Risk (03/27/2022)   SDOH Interventions:     Readmission Risk Interventions     No data to display

## 2022-03-30 DIAGNOSIS — S0510XA Contusion of eyeball and orbital tissues, unspecified eye, initial encounter: Secondary | ICD-10-CM | POA: Diagnosis not present

## 2022-03-30 DIAGNOSIS — S22010A Wedge compression fracture of first thoracic vertebra, initial encounter for closed fracture: Secondary | ICD-10-CM

## 2022-03-30 DIAGNOSIS — J45901 Unspecified asthma with (acute) exacerbation: Secondary | ICD-10-CM | POA: Diagnosis not present

## 2022-03-30 DIAGNOSIS — J9601 Acute respiratory failure with hypoxia: Secondary | ICD-10-CM | POA: Diagnosis not present

## 2022-03-30 LAB — CBC
HCT: 30.1 % — ABNORMAL LOW (ref 36.0–46.0)
Hemoglobin: 9.6 g/dL — ABNORMAL LOW (ref 12.0–15.0)
MCH: 29.6 pg (ref 26.0–34.0)
MCHC: 31.9 g/dL (ref 30.0–36.0)
MCV: 92.9 fL (ref 80.0–100.0)
Platelets: 214 10*3/uL (ref 150–400)
RBC: 3.24 MIL/uL — ABNORMAL LOW (ref 3.87–5.11)
RDW: 13.2 % (ref 11.5–15.5)
WBC: 9.1 10*3/uL (ref 4.0–10.5)
nRBC: 0 % (ref 0.0–0.2)

## 2022-03-30 LAB — BASIC METABOLIC PANEL
Anion gap: 7 (ref 5–15)
BUN: 29 mg/dL — ABNORMAL HIGH (ref 8–23)
CO2: 28 mmol/L (ref 22–32)
Calcium: 10 mg/dL (ref 8.9–10.3)
Chloride: 98 mmol/L (ref 98–111)
Creatinine, Ser: 0.98 mg/dL (ref 0.44–1.00)
GFR, Estimated: 58 mL/min — ABNORMAL LOW (ref >60)
Glucose, Bld: 98 mg/dL (ref 70–99)
Potassium: 4.2 mmol/L (ref 3.5–5.1)
Sodium: 134 mmol/L — ABNORMAL LOW (ref 135–145)

## 2022-03-30 LAB — PHOSPHORUS: Phosphorus: 3.1 mg/dL (ref 2.5–4.6)

## 2022-03-30 LAB — MAGNESIUM: Magnesium: 2 mg/dL (ref 1.7–2.4)

## 2022-03-30 NOTE — Progress Notes (Signed)
[place]  [phone] [phone]  ID: [name] OB: 12-28-1940  MR#: [id number]  [id number]  Patient Care Team: Alvester Morin, MD as PCP - General (Family Medicine) Nyoka Cowden Phylis Bougie, NP as Nurse Practitioner (Geriatric Medicine)  CHIEF COMPLAINT: Mediastinal mass.  INTERVAL HISTORY: Patient resting in bed comfortably.  Offers no new complaints.  REVIEW OF SYSTEMS:   Review of Systems  Constitutional:  Positive for malaise/fatigue. Negative for fever and weight loss.  Respiratory:  Positive for shortness of breath. Negative for cough and hemoptysis.   Cardiovascular: Negative.  Negative for chest pain and leg swelling.  Gastrointestinal: Negative.  Negative for abdominal pain.  Genitourinary: Negative.  Negative for dysuria.  Musculoskeletal: Negative.  Negative for back pain.  Skin:  Negative for rash.  Neurological:  Positive for weakness. Negative for dizziness, focal weakness and headaches.  Psychiatric/Behavioral: Negative.  The patient is not nervous/anxious.     As per HPI. Otherwise, a complete review of systems is negative.  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    Depression with anxiety    GERD (gastroesophageal reflux disease)    Hemiparesis affecting left side as late effect of cerebrovascular accident (CVA) ([place])    Hot flashes    Hyperlipidemia    Hypertension    Muscle hypertonicity    Osteoarthritis    Stroke ([place])    TIA (transient ischemic attack)     PAST SURGICAL HISTORY: Past Surgical History:  Procedure Laterality Date   ACHILLES TENDON LENGTHENING  08/25/2015   Procedure: ACHILLES TENDON LENGTHENING;  Surgeon: Samara Deist, DPM;  Location: [facility] ORS;  Service: Podiatry;;   APPENDECTOMY     COLONOSCOPY  06/17/2008   EYE SURGERY Bilateral    Cataract Extraction with IOL   FLAT FOOT  RECONSTRUCTION-TAL GASTROC RECESSION Left 08/25/2015   Procedure: ENDOSCOPIC FLAT FOOT RECONSTRUCTION-TAL GASTROC RECESSION;  Surgeon: Samara Deist, DPM;  Location: [facility] ORS;  Service: Podiatry;  Laterality: Left;   VAGINAL HYSTERECTOMY      FAMILY HISTORY: Family History  Problem Relation Age of Onset   Alcohol abuse Mother    Coronary artery disease Mother    Stroke Mother    Liver cancer Father    Alcohol abuse Brother    Coronary artery disease Brother    Diabetes type II Brother    Liver cancer Brother     ADVANCED DIRECTIVES (Y/N):  @ADVDIR @  HEALTH MAINTENANCE: Social History   Tobacco Use   Smoking status: Former    Packs/day: 0.50    Years: 3.00    Additional pack years: 0.00    Total pack years: 1.50    Types: Cigarettes    Quit date: 01/08/1963    Years since quitting: 59.2   Smokeless tobacco: Never  Vaping Use   Vaping Use: Never used  Substance Use Topics   Alcohol use: No    Alcohol/week: 0.0 standard drinks of alcohol    Comment: does not drink alcohol   Drug use: No     Colonoscopy:  PAP:  Bone density:  Lipid panel:  Allergies  Allergen Reactions   Tizanidine Other (See Comments)    HYPOTENSION AND UNRESPONSIVENESS   Ace Inhibitors Cough   Latex Hives   Lisinopril Cough   Tetracycline Other (See Comments)     REACTION: yeast   Zanaflex [Tizanidine Hcl] Other (See Comments)    "  drowsy"    Current Facility-Administered Medications  Medication Dose Route Frequency Provider Last Rate Last Admin   acetaminophen (TYLENOL) tablet 650 mg  650 mg Oral Q6H PRN Mansy, Jan A, MD       Or   acetaminophen (TYLENOL) suppository 650 mg  650 mg Rectal Q6H PRN Mansy, Arvella Merles, MD       alum & mag hydroxide-simeth (MAALOX/MYLANTA) 200-200-20 MG/5ML suspension 30 mL  30 mL Oral Q4H PRN Mansy, Jan A, MD       azithromycin (ZITHROMAX) tablet 500 mg  500 mg Oral Q24H Val Riles, MD   500 mg at 03/29/22 1736   bisacodyl (DULCOLAX) EC tablet 5 mg  5 mg Oral  Melvenia Needles, MD   5 mg at 03/29/22 1019   carvedilol (COREG) tablet 3.125 mg  3.125 mg Oral BID Mansy, Jan A, MD   3.125 mg at 03/30/22 1013   chlorpheniramine-HYDROcodone (TUSSIONEX) 10-8 MG/5ML suspension 5 mL  5 mL Oral Q12H PRN Mansy, Jan A, MD       cholecalciferol (VITAMIN D3) 25 MCG (1000 UNIT) tablet 5,000 Units  5,000 Units Oral Daily Mansy, Jan A, MD   5,000 Units at 03/30/22 1013   demeclocycline (DECLOMYCIN) tablet 300 mg  300 mg Oral BID Mansy, Jan A, MD   300 mg at 03/30/22 1056   divalproex (DEPAKOTE) DR tablet 125 mg  125 mg Oral BID Mansy, Jan A, MD   125 mg at 03/30/22 1014   enoxaparin (LOVENOX) injection 42.5 mg  0.5 mg/kg Subcutaneous Q24H Mansy, Jan A, MD   42.5 mg at 03/30/22 1032   fluticasone furoate-vilanterol (BREO ELLIPTA) 200-25 MCG/ACT 1 puff  1 puff Inhalation Daily Val Riles, MD   1 puff at 03/30/22 1026   guaiFENesin (MUCINEX) 12 hr tablet 1,200 mg  1,200 mg Oral BID PRN Mansy, Jan A, MD       guaiFENesin (MUCINEX) 12 hr tablet 600 mg  600 mg Oral BID Mansy, Jan A, MD   600 mg at 03/30/22 1014   hydrALAZINE (APRESOLINE) injection 10 mg  10 mg Intravenous Q6H PRN Val Riles, MD       Or   hydrALAZINE (APRESOLINE) tablet 50 mg  50 mg Oral Q6H PRN Val Riles, MD       hydrocerin (EUCERIN) cream   Topical BID Mansy, Arvella Merles, MD   Given at 03/29/22 2208   hydrocortisone cream 1 % 1 Application  1 Application Topical BID Mansy, Jan A, MD   1 Application at [id number] 1030   ipratropium-albuterol (DUONEB) 0.5-2.5 (3) MG/3ML nebulizer solution 3 mL  3 mL Nebulization Q6H PRN Mansy, Jan A, MD       levothyroxine (SYNTHROID) tablet 75 mcg  75 mcg Oral Q0600 Mansy, Jan A, MD   75 mcg at 03/30/22 [id number]   magnesium hydroxide (MILK OF MAGNESIA) suspension 30 mL  30 mL Oral Daily PRN Mansy, Jan A, MD       montelukast (SINGULAIR) tablet 10 mg  10 mg Oral QHS Mansy, Jan A, MD   10 mg at 03/29/22 2213   morphine (PF) 2 MG/ML injection 1-2 mg  1-2 mg Intravenous Q4H PRN  Mansy, Jan A, MD   2 mg at 03/30/22 0235   multivitamin with minerals tablet 1 tablet  1 tablet Oral Daily Val Riles, MD   1 tablet at 03/30/22 1235   nystatin (MYCOSTATIN/NYSTOP) topical powder   Topical BID Mansy, Arvella Merles, MD   Given at  03/29/22 2213   ondansetron (ZOFRAN) tablet 4 mg  4 mg Oral Q6H PRN Mansy, Jan A, MD       Or   ondansetron [facility]) injection 4 mg  4 mg Intravenous Q6H PRN Mansy, Jan A, MD       pantoprazole (PROTONIX) EC tablet 40 mg  40 mg Oral Daily Mansy, Jan A, MD   40 mg at 03/30/22 1013   polyethylene glycol (MIRALAX / GLYCOLAX) packet 17 g  17 g Oral Daily Val Riles, MD   17 g at 03/30/22 1012   prazosin (MINIPRESS) capsule 3 mg  3 mg Oral QHS Mansy, Jan A, MD   3 mg at 03/29/22 2208   predniSONE (DELTASONE) tablet 40 mg  40 mg Oral Q breakfast Mansy, Jan A, MD   40 mg at 03/30/22 1013   senna-docusate (Senokot-S) tablet 2 tablet  2 tablet Oral BID PRN Mansy, Jan A, MD       sodium chloride tablet 1 g  1 g Oral TID WC Mansy, Jan A, MD   1 g at 03/30/22 1235   torsemide (DEMADEX) tablet 10 mg  10 mg Oral Daily Mansy, Jan A, MD   10 mg at 03/30/22 1013   traZODone (DESYREL) tablet 25 mg  25 mg Oral QHS PRN Mansy, Jan A, MD   25 mg at 03/28/22 0128   triamcinolone (NASACORT) nasal inhaler 1 spray  1 spray Nasal Daily Mansy, Jan A, MD   1 spray at 03/30/22 1029   venlafaxine XR (EFFEXOR-XR) 24 hr capsule 37.5 mg  37.5 mg Oral Daily Mansy, Jan A, MD   37.5 mg at 03/30/22 1014    OBJECTIVE: Vitals:   03/29/22 2342 03/30/22 1013  BP: (!) 103/57 117/62  Pulse: 100 (!) 108  Resp: 18   Temp: 98.4 F (36.9 C)   SpO2: 95%      Body mass index is 34.99 kg/m.    ECOG FS:3 - Symptomatic, >50% confined to bed  General: Well-developed, well-nourished, no acute distress. Eyes: Pink conjunctiva, anicteric sclera. HEENT: Normocephalic, moist mucous membranes. Lungs: No audible wheezing or coughing. Heart: Regular rate and rhythm. Abdomen: Soft, nontender, no obvious  distention. Musculoskeletal: No edema, cyanosis, or clubbing. Neuro: Alert, answering all questions appropriately. Cranial nerves grossly intact. Skin: No rashes or petechiae noted. Psych: Flat affect.  LAB RESULTS:  Lab Results  Component Value Date   NA 134 (L) 03/30/2022   K 4.2 03/30/2022   CL 98 03/30/2022   CO2 28 03/30/2022   GLUCOSE 98 03/30/2022   BUN 29 (H) 03/30/2022   CREATININE 0.98 03/30/2022   CALCIUM 10.0 03/30/2022   PROT 6.8 07/14/2021   ALBUMIN 3.6 07/14/2021   AST 17 07/14/2021   ALT 14 07/14/2021   ALKPHOS 76 07/14/2021   BILITOT 0.8 07/14/2021   GFRNONAA 58 (L) 03/30/2022   GFRAA >60 02/17/2018    Lab Results  Component Value Date   WBC 9.1 03/30/2022   NEUTROABS 4.9 07/14/2021   HGB 9.6 (L) 03/30/2022   HCT 30.1 (L) 03/30/2022   MCV 92.9 03/30/2022   PLT 214 03/30/2022     STUDIES: CT Angio Chest PE W and/or Wo Contrast  Result Date: 03/27/2022 CLINICAL DATA:  Pulmonary embolism (PE) suspected, high prob. Chest pain EXAM: CT ANGIOGRAPHY CHEST WITH CONTRAST TECHNIQUE: Multidetector CT imaging of the chest was performed using the standard protocol during bolus administration of intravenous contrast. Multiplanar CT image reconstructions and MIPs were obtained to evaluate the  vascular anatomy. RADIATION DOSE REDUCTION: This exam was performed according to the departmental dose-optimization program which includes automated exposure control, adjustment of the mA and/or kV according to patient size and/or use of iterative reconstruction technique. CONTRAST:  15mL OMNIPAQUE IOHEXOL 350 MG/ML SOLN COMPARISON:  11/23/2014 FINDINGS: Cardiovascular: There is adequate opacification of the pulmonary arterial tree. No intraluminal filling defect identified through the segmental level to suggest acute pulmonary embolism. The central pulmonary arteries are of normal caliber. Mild multi-vessel coronary artery calcification. Mild global cardiomegaly, stable. Small  pericardial effusion is unchanged. Mild atherosclerotic calcification within the thoracic aorta. No aortic aneurysm. Mediastinum/Nodes: 4.4 x 4.6 cm left thyroid mass appears stable since prior examination, not optimally assessed but likely benign given its stability over time. This was previously assessed on sonogram of 11/24/2014. In the setting of significant comorbidities or limited life expectancy, no follow-up recommended (ref: J Am Coll Radiol. 2015 Feb;12(2): 143-50).Anterior mediastinal mass has enlarged in the interval since prior examination measuring 2.8 x 3.7 cm at axial image # 55/4, previously measuring 0.7 x 1.2 cm, most in keeping with a thymoma or lymphoma. Stable mass effect upon the proximal esophagus by the thyroid mass. The esophagus is otherwise unremarkable. Moderate hiatal hernia. Lungs/Pleura: Bilateral dependent atelectasis. Ground-glass opacities within the lung apices likely represent atelectasis in this extra tori phase examination. There is marked narrowing of the mainstem bronchi and bronchus intermedius in the AP dimension in keeping with changes of bronchomalacia. No pneumothorax or pleural effusion. Upper Abdomen: Stable 2.4 cm left adrenal adenoma. No follow-up imaging is recommended for this lesion. No acute abnormality within the visualized abdomen. Musculoskeletal: Interval development of an age indeterminate compression deformity of T1 with approximately 30-40% loss of height. Stable anterior wedge compression deformity of T2. Healed mid sternal fracture noted, new from prior examination. Osseous structures are otherwise age-appropriate. Review of the MIP images confirms the above findings. IMPRESSION: 1. No pulmonary embolism. 2. Mild multi-vessel coronary artery calcification. Stable mild global cardiomegaly. Stable small pericardial effusion. 3. Bronchomalacia involving the mainstem bronchi bilaterally and bronchus intermedius. 4. Interval enlargement of a anterior  mediastinal mass most in keeping with a thymoma or lymphoma. Surgical consultation may be helpful for further management. 5. Stable 4.6 cm left thyroid mass, not optimally assessed but likely benign given its stability over time. This was previously assessed on sonogram of 11/24/2014. In the setting of significant clinical comorbidity or limited life expectancy, no follow-up imaging is recommended. 6. Moderate hiatal hernia. 7. Stable 2.4 cm left adrenal adenoma. 8. Interval development of an age indeterminate compression deformity of T1 with approximately 30-40% loss of height. Correlation for point tenderness is recommended. If indicated, this could be better assessed with MRI examination. Aortic Atherosclerosis (ICD10-I70.0). Electronically Signed   By: Fidela Salisbury M.D.   On: 03/27/2022 22:58   DG Knee Complete 4 Views Right  Result Date: 03/27/2022 CLINICAL DATA:  Fall and trauma to the right knee. EXAM: RIGHT KNEE - COMPLETE 4+ VIEW COMPARISON:  None Available. FINDINGS: No acute fracture or dislocation. The bones are osteopenic. Mild arthritic changes and tricompartmental narrowing. No significant joint effusion. The soft tissues are unremarkable. IMPRESSION: 1. No acute fracture or dislocation. 2. Mild arthritic changes. Electronically Signed   By: Anner Crete M.D.   On: 03/27/2022 21:29   DG Knee Complete 4 Views Left  Result Date: 03/27/2022 CLINICAL DATA:  Fall with bilateral knee pain. EXAM: LEFT KNEE - COMPLETE 4 VIEW COMPARISON:  None Available. FINDINGS: No acute  fracture or subluxation. A small joint effusion is possible. Generalized osteopenia. Undulating appearance of the lateral femoral condyle which appears chronic on the frontal view. Generalized osteopenia. IMPRESSION: No acute finding. Electronically Signed   By: Jorje Guild M.D.   On: 03/27/2022 21:29   DG Chest 1 View  Result Date: 03/27/2022 CLINICAL DATA:  Chest pain EXAM: CHEST  1 VIEW COMPARISON:  07/14/2021  FINDINGS: Right diaphragm appears slightly elevated. Linear atelectasis or scar at the left base. Mild cardiomegaly with aortic atherosclerosis. No pleural effusion or pneumothorax. IMPRESSION: Mild cardiomegaly. Linear atelectasis or scar at the left base. Electronically Signed   By: Donavan Foil M.D.   On: 03/27/2022 21:28   DG Forearm Left  Result Date: 03/27/2022 CLINICAL DATA:  Contracture.  Fall. EXAM: LEFT FOREARM - 2 VIEW COMPARISON:  None Available. FINDINGS: Wrist and hand positioning correlating with history of contracture. Generalized osteopenia. No acute fracture or dislocation. IMPRESSION: No acute finding. Electronically Signed   By: Jorje Guild M.D.   On: 03/27/2022 21:27   CT Cervical Spine Wo Contrast  Result Date: 03/27/2022 CLINICAL DATA:  Blunt facial trauma. EXAM: CT CERVICAL SPINE WITHOUT CONTRAST TECHNIQUE: Multidetector CT imaging of the cervical spine was performed without intravenous contrast. Multiplanar CT image reconstructions were also generated. RADIATION DOSE REDUCTION: This exam was performed according to the departmental dose-optimization program which includes automated exposure control, adjustment of the mA and/or kV according to patient size and/or use of iterative reconstruction technique. COMPARISON:  None Available. FINDINGS: Alignment: Exaggerated kyphosis at the cervicothoracic junction. Skull base and vertebrae: T1 body fracture with horizontal fracture plane and impaction. Trace retropulsion. The fracture could be subacute given the indistinct margins. Remote T2 compression fracture with similar mild to moderate height loss. No acute cervical spine fracture. Soft tissues and spinal canal: Nodule in the left lobe thyroid measuring at least 5.2 cm. This was evaluated by ultrasound 11/24/2014 with biopsy recommended. No worrisome growth given the long interval. No follow-up recommended unless clinically warranted (ref: J Am Coll Radiol. 2015 Feb;12(2): 143-50).  Disc levels:  Ordinary degenerative changes Upper chest: No acute finding. IMPRESSION: 1. T1 compression fracture which could be subacute to chronic. Remote T2 compression fracture. 2. Negative for cervical spine fracture or subluxation. Electronically Signed   By: Jorje Guild M.D.   On: 03/27/2022 21:19   CT Head Wo Contrast  Result Date: 03/27/2022 CLINICAL DATA:  Status post trauma. EXAM: CT HEAD WITHOUT CONTRAST TECHNIQUE: Contiguous axial images were obtained from the base of the skull through the vertex without intravenous contrast. RADIATION DOSE REDUCTION: This exam was performed according to the departmental dose-optimization program which includes automated exposure control, adjustment of the mA and/or kV according to patient size and/or use of iterative reconstruction technique. COMPARISON:  November 26, 2014 FINDINGS: Brain: There is moderate severity cerebral atrophy with widening of the extra-axial spaces and ventricular dilatation. There are areas of decreased attenuation within the white matter tracts of the supratentorial brain, consistent with microvascular disease changes. A small, chronic right basal ganglia lacunar infarct is noted. Vascular: No hyperdense vessel or unexpected calcification. Skull: Normal. Negative for fracture or focal lesion. Sinuses/Orbits: No acute finding. Other: Marked severity bifrontal scalp soft tissue swelling is seen along the midline, left greater than right. This extends along the vertex. An associated large, lobulated scalp soft tissue hematoma is noted IMPRESSION: 1. Marked severity frontal scalp soft tissue swelling with an associated large, lobulated scalp soft tissue hematoma. 2. No acute intracranial  abnormality. 3. Small, chronic right basal ganglia lacunar infarct. 4. Generalized cerebral atrophy and microvascular disease changes of the supratentorial brain. Electronically Signed   By: Virgina Norfolk M.D.   On: 03/27/2022 21:09    ASSESSMENT:  Mediastinal mass.  PLAN:    Mediastinal mass: Previously mediastinal mass was assessed by sonogram in 2016 and noted to be 0.7 x 1.2 cm.  Since that time it has enlarged to 2.8 x 3.7.  Per interventional radiology, mass is not something accessible percutaneously and patient would require a mediastinoscopy for diagnosis.  Given her multiple comorbidities and the slow growth of the lesion, agree that no biopsy is necessary at this time.  Plus, if malignant patient likely would not be able to tolerate treatment.  No intervention is needed from an oncology standpoint.  Case discussed with patient's daughter, Almyra Free. Recommend patient's primary care repeat CT scan in 1 year to assess for interval change. Shortness of breath: Unrelated to mediastinal mass.  Continue symptomatic treatment as ordered.    Lloyd Huger, MD   03/30/2022 2:21 PM

## 2022-03-30 NOTE — Plan of Care (Signed)
  Problem: Clinical Measurements: Goal: Diagnostic test results will improve Outcome: Progressing Goal: Respiratory complications will improve Outcome: Progressing Goal: Cardiovascular complication will be avoided Outcome: Progressing   Problem: Activity: Goal: Risk for activity intolerance will decrease Outcome: Progressing   Problem: Nutrition: Goal: Adequate nutrition will be maintained Outcome: Progressing   Problem: Pain Managment: Goal: General experience of comfort will improve Outcome: Progressing   

## 2022-03-30 NOTE — Plan of Care (Signed)
  Problem: Education: Goal: Knowledge of General Education information will improve Description: Including pain rating scale, medication(s)/side effects and non-pharmacologic comfort measures Outcome: Progressing   Problem: Clinical Measurements: Goal: Ability to maintain clinical measurements within normal limits will improve Outcome: Progressing   Problem: Nutrition: Goal: Adequate nutrition will be maintained Outcome: Progressing   Problem: Pain Managment: Goal: General experience of comfort will improve Outcome: Progressing   Problem: Safety: Goal: Ability to remain free from injury will improve Outcome: Progressing   Problem: Skin Integrity: Goal: Risk for impaired skin integrity will decrease Outcome: Progressing   

## 2022-03-30 NOTE — Progress Notes (Signed)
  Progress Note   Patient: [name] [id number] DOB: 1940-03-09 DOA: 03/27/2022     3 DOS: the patient was seen and examined on 03/30/2022   Brief hospital course: [name] is a 82 y.o. Caucasian female with medical history significant for asthma, GERD, depression, and anxiety, hypertension, dyslipidemia, osteoarthritis and CVA, who presented to the emergency room with acute onset of fall off of the commode at her SNF.  Patient was found to have 85% on room air.  Respiratory failure due to asthma exacerbation.  CT head showed severe frontal scalp soft tissue swelling and scalp subcu hematoma. CT C-spine showed a T1 compression fracture. CT chest showed a stable left lower thyroid mass 2.4 cm left adrenal adenoma, midsternal mass which is larger than 2016. Patient is placed on steroids, bronchodilators for asthma exacerbation.  Consult from oncology obtained.  No intervention is warranted.   Principal Problem:   Acute respiratory failure with hypoxia (HCC) Active Problems:   Fall   Hypothyroidism   Essential hypertension, benign   Gastroesophageal reflux disease without esophagitis   Peripheral neuropathy   Hemiparesis affecting left side as late effect of cerebrovascular accident (CVA) (HCC)   Asthma, chronic, unspecified asthma severity, with acute exacerbation   Thymoma   Head contusion   Traumatic compression fracture of T1 vertebra (HCC)   Obesity (BMI 30-39.9)   Assessment and Plan:  Acute respiratory failure with hypoxia (HCC) acute asthma exacerbation. Patient condition still improving, will try to wean off oxygen.  Continue oral steroids.   Mediastinal mass most likely due to thymoma. Thyroid mass. Adrenal mass. Seen by oncology, no biopsy or intervention due to slow growth and overall poor prognosis.     Fall T1 compression fracture. Head contusion. Discussed with TOC, reordered PT/OT, may consider rehab instead of long-term care.   Left  hemiparesis secondary to old CVA. Continue PT/OT.     Hypothyroidism continue Synthroid.   Essential hypertension, benign continue her antihypertensives.   Gastroesophageal reflux disease without esophagitis  continue PPI therapy.   Peripheral neuropathy - We will continue her Neurontin.      Subjective:  Patient doing well today, no complaints.  Physical Exam: Vitals:   03/29/22 1000 03/29/22 1807 03/29/22 2342 03/30/22 1013  BP: 133/67 (!) 149/59 (!) 103/57 117/62  Pulse: 94 (!) 109 100 (!) 108  Resp: 16 16 18    Temp: 98 F (36.7 C) 98.3 F (36.8 C) 98.4 F (36.9 C)   TempSrc: Oral     SpO2: 96% 93% 95%   Weight:      Height:       General exam: Appears calm and comfortable  Respiratory system: Clear to auscultation. Respiratory effort normal. Cardiovascular system: S1 & S2 heard, RRR. No JVD, murmurs, rubs, gallops or clicks. No pedal edema. Gastrointestinal system: Abdomen is nondistended, soft and nontender. No organomegaly or masses felt. Normal bowel sounds heard. Central nervous system: Alert and oriented x2. No focal neurological deficits. Extremities: Symmetric 5 x 5 power. Skin: No rashes, lesions or ulcers Psychiatry: Mood & affect appropriate.    Data Reviewed:  There are no new results to review at this time.  Family Communication: None  Disposition: Status is: Inpatient Remains inpatient appropriate because: Severity of disease.     Time spent: 35 minutes  Author: Sharen Hones, MD 03/30/2022 1:08 PM  For on call review [email or web address].

## 2022-03-30 NOTE — Plan of Care (Signed)

## 2022-03-30 NOTE — TOC Progression Note (Signed)
Transition of Care [facility]) - Progression Note    Patient Details  Name: Mistey Fimbres MRN: [id number] Date of Birth: 1940-11-26  Transition of Care [facility]) CM/SW Contact  Izola Price, RN Phone Number: 03/30/2022, 12:27 PM  Clinical Narrative:  3/23: Kandice Robinsons weekend supervisor, Colletta [place], regarding returning to [place]. She indicated Monday for readmission. Clarifying if going back to LTC or STR per PT notes recommending STR/SNF. Updated provider. [name] RN CM          Expected Discharge Plan and Services                                               Social Determinants of Health (SDOH) Interventions SDOH Screenings   Food Insecurity: No Food Insecurity (03/28/2022)  Housing: Low Risk  (03/28/2022)  Transportation Needs: No Transportation Needs (03/28/2022)  Utilities: Not At Risk (03/28/2022)  Tobacco Use: Medium Risk (03/27/2022)    Readmission Risk Interventions     No data to display

## 2022-03-31 DIAGNOSIS — S22010A Wedge compression fracture of first thoracic vertebra, initial encounter for closed fracture: Secondary | ICD-10-CM | POA: Diagnosis not present

## 2022-03-31 DIAGNOSIS — I69354 Hemiplegia and hemiparesis following cerebral infarction affecting left non-dominant side: Secondary | ICD-10-CM | POA: Diagnosis not present

## 2022-03-31 DIAGNOSIS — J9601 Acute respiratory failure with hypoxia: Secondary | ICD-10-CM | POA: Diagnosis not present

## 2022-03-31 LAB — BASIC METABOLIC PANEL
Anion gap: 6 (ref 5–15)
BUN: 29 mg/dL — ABNORMAL HIGH (ref 8–23)
CO2: 28 mmol/L (ref 22–32)
Calcium: 10.2 mg/dL (ref 8.9–10.3)
Chloride: 100 mmol/L (ref 98–111)
Creatinine, Ser: 1.03 mg/dL — ABNORMAL HIGH (ref 0.44–1.00)
GFR, Estimated: 54 mL/min — ABNORMAL LOW (ref >60)
Glucose, Bld: 95 mg/dL (ref 70–99)
Potassium: 4.2 mmol/L (ref 3.5–5.1)
Sodium: 134 mmol/L — ABNORMAL LOW (ref 135–145)

## 2022-03-31 LAB — PHOSPHORUS: Phosphorus: 4 mg/dL (ref 2.5–4.6)

## 2022-03-31 LAB — CBC
HCT: 29.5 % — ABNORMAL LOW (ref 36.0–46.0)
Hemoglobin: 9.6 g/dL — ABNORMAL LOW (ref 12.0–15.0)
MCH: 30.3 pg (ref 26.0–34.0)
MCHC: 32.5 g/dL (ref 30.0–36.0)
MCV: 93.1 fL (ref 80.0–100.0)
Platelets: 209 10*3/uL (ref 150–400)
RBC: 3.17 MIL/uL — ABNORMAL LOW (ref 3.87–5.11)
RDW: 13.1 % (ref 11.5–15.5)
WBC: 7.3 10*3/uL (ref 4.0–10.5)
nRBC: 0 % (ref 0.0–0.2)

## 2022-03-31 LAB — MAGNESIUM: Magnesium: 1.9 mg/dL (ref 1.7–2.4)

## 2022-03-31 MED ORDER — DEMECLOCYCLINE HCL 150 MG PO TABS
300.0000 mg | ORAL_TABLET | Freq: Two times a day (BID) | ORAL | Status: DC
  Administered 2022-03-31 – 2022-04-01 (×2): 300 mg via ORAL
  Filled 2022-03-31 (×5): qty 2

## 2022-03-31 MED ORDER — OXYCODONE-ACETAMINOPHEN 5-325 MG PO TABS
0.5000 | ORAL_TABLET | ORAL | Status: DC | PRN
  Administered 2022-03-31 – 2022-04-01 (×4): 0.5 via ORAL
  Filled 2022-03-31 (×4): qty 1

## 2022-03-31 MED ORDER — LIDOCAINE 5 % EX PTCH
1.0000 | MEDICATED_PATCH | CUTANEOUS | Status: DC
  Administered 2022-03-31 – 2022-04-01 (×2): 1 via TRANSDERMAL
  Filled 2022-03-31 (×2): qty 1

## 2022-03-31 MED ORDER — LACTULOSE 10 GM/15ML PO SOLN
20.0000 g | Freq: Once | ORAL | Status: AC
  Administered 2022-03-31: 20 g via ORAL
  Filled 2022-03-31: qty 30

## 2022-03-31 NOTE — TOC Progression Note (Signed)
Transition of Care [facility]) - Progression Note    Patient Details  Name: [name] MRN: [id number] Date of Birth: Feb 12, 1940  Transition of Care [facility]) CM/SW Contact  Izola Price, RN Phone Number: 03/31/2022, 12:09 PM  Clinical Narrative:  3/24: New or fresh PT/OT evals ordered 03/30/22 for potential STR at [place]. No notes in yet, asked Unit RN to check with PT on unit to see if can see today. Pending DC tomorrow. New FL2 sent via Hub to [facility]. Updated provider. [name] RNCM   UPDATE: Per PTA original evaluation should have been for LTC with PT follow up in facility to bring back to baseline. Will revisit recommendation documentation and update.  [name] RN CM          Expected Discharge Plan and Services                                               Social Determinants of Health (SDOH) Interventions SDOH Screenings   Food Insecurity: No Food Insecurity (03/28/2022)  Housing: Low Risk  (03/28/2022)  Transportation Needs: No Transportation Needs (03/28/2022)  Utilities: Not At Risk (03/28/2022)  Tobacco Use: Medium Risk (03/27/2022)    Readmission Risk Interventions     No data to display

## 2022-03-31 NOTE — NC FL2 (Signed)
[place] LEVEL OF CARE FORM     IDENTIFICATION  Patient Name: [name] Birthdate: 07-11-1940 Sex: female Admission Date (Current Location): 03/27/2022  [place] and [place] Number:  Selena Lesser [id number] [place] and Address:  [facility], [address], [place], [place] [address]      Provider Number: [id number]  Attending Physician Name and Address:  Sharen Hones, MD  Relative Name and Phone Number:  Levonne Spiller (Daughter) [phone] [facility])    Current Level of Care: Hospital Recommended Level of Care: [place] (Prior LTC patient at [facility]) Prior Approval Number: [id number]  Date Approved/Denied:   PASRR Number: [id number]  Discharge Plan: SNF    Current Diagnoses: Patient Active Problem List   Diagnosis Date Noted   Head contusion 03/29/2022   Traumatic compression fracture of T1 vertebra ([place]) 03/29/2022   Obesity (BMI 30-39.9) 03/29/2022   Asthma, chronic, unspecified asthma severity, with acute exacerbation 03/28/2022   Fall 03/28/2022   Thymoma 03/28/2022   Acute respiratory failure with hypoxia ([place]) 03/27/2022   Hyponatremia 07/14/2021   Pain and swelling of lower leg 03/12/2020   Lymphedema 03/12/2020   DJD (degenerative joint disease) 03/12/2020   Acute bronchitis 11/28/2017   Acute renal failure ([place]) 08/27/2017   Bilateral lower extremity edema 07/14/2017   Bilateral chronic knee pain 07/14/2017   Onychomycosis 02/12/2017   SIADH (syndrome of inappropriate ADH production) ([place]) 01/07/2017   Hemiparesis affecting left side as late effect of cerebrovascular accident (CVA) ([place]) 05/29/2016   Other sequelae of cerebral infarction 05/29/2016   Pain of left leg 05/29/2016   Pain of left arm 05/29/2016   Major depression, single episode [id number]   Uncomplicated asthma [id number]   Nontoxic thyroid nodule 05/29/2016   Flexion deformity, left ankle and toes 05/29/2016   Contracture,  left hand 05/29/2016   Lack of coordination 05/29/2016   Osteoarthritis of left shoulder 05/29/2016   Chronic non-seasonal allergic rhinitis 05/29/2016   Peripheral neuropathy 05/29/2016   Overactive bladder 05/29/2016   Corn of toe 05/29/2016   Dry eyes 05/29/2016   Posttraumatic stress disorder 07/21/2015   Essential hypertension, benign 07/21/2015   Hypermyotonia 07/21/2015   HCAP (healthcare-associated pneumonia) 11/19/2014   Chronic hyponatremia 11/18/2014   Mixed incontinence 09/30/2014   Pure hypercholesterolemia 10/22/2013   Cerebral artery occlusion with cerebral infarction ([place]) 04/04/2009   Gastroesophageal reflux disease without esophagitis 10/13/2007   Hypothyroidism 07/23/2006   Hyperlipidemia 07/23/2006   ROSACEA 07/23/2006   Osteopenia 07/23/2006    Orientation RESPIRATION BLADDER Height & Weight     Self, Time, Situation  Normal, O2 (2L/[place]) Incontinent Weight: 84 kg Height:  5\' 1"  (154.9 cm)  BEHAVIORAL SYMPTOMS/MOOD NEUROLOGICAL BOWEL NUTRITION STATUS      Continent (Last BM documented in PT notes on 03/28/22) Diet  AMBULATORY STATUS COMMUNICATION OF NEEDS Skin   Extensive Assist (LEFT hand contracture requiring DME modifications and more assist with mobility, dressing, hygiend, meal set up.) Verbally Skin abrasions (mulltiple bilateral over arns, face, neck, head, knee, Bilateral hematoma on buttocks with bilaterl redness documented in nursing assessment.)                       Personal Care Assistance Level of Assistance  Bathing, Feeding, Dressing Bathing Assistance: Maximum assistance Feeding assistance: Limited assistance Dressing Assistance: Maximum assistance     Functional Limitations Info  Sight Sight Info: Impaired (corrective glasses)        SPECIAL CARE FACTORS FREQUENCY  PT (By  licensed PT), OT (By licensed OT)     PT Frequency: 5x/week OT Frequency: 5x/week            Contractures Contractures Info: Present (LEFT hand with  limited ROM in arm/pain.)    Additional Factors Info  Code Status, Allergies Code Status Info: Full Code Allergies Info: tizanidine, Ace Inhibitors, Latex, Lisinopril, Tetracycline, Zanaflex (Tizanidine Hcl)--please check chart for an updates, reviewed 03/30/21 at 1156 am.           Current Medications (03/31/2022):  This is the current hospital active medication list Current Facility-Administered Medications  Medication Dose Route Frequency Provider Last Rate Last Admin   acetaminophen (TYLENOL) tablet 650 mg  650 mg Oral Q6H PRN Mansy, Jan A, MD   650 mg at 03/31/22 0019   Or   acetaminophen (TYLENOL) suppository 650 mg  650 mg Rectal Q6H PRN Mansy, Jan A, MD       alum & mag hydroxide-simeth (MAALOX/MYLANTA) 200-200-20 MG/5ML suspension 30 mL  30 mL Oral Q4H PRN Mansy, Jan A, MD       bisacodyl (DULCOLAX) EC tablet 5 mg  5 mg Oral Silvestre Moment, Dileep, MD   5 mg at 03/31/22 1011   carvedilol (COREG) tablet 3.125 mg  3.125 mg Oral BID Mansy, Jan A, MD   3.125 mg at 03/31/22 1011   chlorpheniramine-HYDROcodone (TUSSIONEX) 10-8 MG/5ML suspension 5 mL  5 mL Oral Q12H PRN Mansy, Jan A, MD       cholecalciferol (VITAMIN D3) 25 MCG (1000 UNIT) tablet 5,000 Units  5,000 Units Oral Daily Mansy, Jan A, MD   5,000 Units at 03/31/22 1009   demeclocycline (DECLOMYCIN) tablet 300 mg  300 mg Oral BID Mansy, Jan A, MD   300 mg at 03/31/22 1101   divalproex (DEPAKOTE) DR tablet 125 mg  125 mg Oral BID Mansy, Jan A, MD   125 mg at 03/31/22 1014   enoxaparin (LOVENOX) injection 42.5 mg  0.5 mg/kg Subcutaneous Q24H Mansy, Jan A, MD   42.5 mg at 03/31/22 1014   fluticasone furoate-vilanterol (BREO ELLIPTA) 200-25 MCG/ACT 1 puff  1 puff Inhalation Daily Val Riles, MD   1 puff at 03/31/22 1014   guaiFENesin (MUCINEX) 12 hr tablet 1,200 mg  1,200 mg Oral BID PRN Mansy, Jan A, MD       guaiFENesin (MUCINEX) 12 hr tablet 600 mg  600 mg Oral BID Mansy, Jan A, MD   600 mg at 03/31/22 1010   hydrALAZINE  (APRESOLINE) injection 10 mg  10 mg Intravenous Q6H PRN Val Riles, MD       Or   hydrALAZINE (APRESOLINE) tablet 50 mg  50 mg Oral Q6H PRN Val Riles, MD       hydrocerin (EUCERIN) cream   Topical BID Mansy, Jan A, MD   Given at 03/31/22 1014   hydrocortisone cream 1 % 1 Application  1 Application Topical BID Mansy, Jan A, MD   1 Application at [id number]   ipratropium-albuterol (DUONEB) 0.5-2.5 (3) MG/3ML nebulizer solution 3 mL  3 mL Nebulization Q6H PRN Mansy, Jan A, MD       levothyroxine (SYNTHROID) tablet 75 mcg  75 mcg Oral Q0600 Mansy, Jan A, MD   75 mcg at 03/31/22 0602   magnesium hydroxide (MILK OF MAGNESIA) suspension 30 mL  30 mL Oral Daily PRN Mansy, Jan A, MD       montelukast (SINGULAIR) tablet 10 mg  10 mg Oral QHS Mansy, Arvella Merles, MD  10 mg at 03/31/22 0018   morphine (PF) 2 MG/ML injection 1-2 mg  1-2 mg Intravenous Q4H PRN Mansy, Jan A, MD   2 mg at 03/31/22 0201   multivitamin with minerals tablet 1 tablet  1 tablet Oral Daily Val Riles, MD   1 tablet at 03/30/22 1235   nystatin (MYCOSTATIN/NYSTOP) topical powder   Topical BID Mansy, Jan A, MD   Given at 03/31/22 1015   ondansetron [facility]) tablet 4 mg  4 mg Oral Q6H PRN Mansy, Jan A, MD       Or   ondansetron [facility]) injection 4 mg  4 mg Intravenous Q6H PRN Mansy, Jan A, MD       pantoprazole (PROTONIX) EC tablet 40 mg  40 mg Oral Daily Mansy, Jan A, MD   40 mg at 03/31/22 1011   polyethylene glycol (MIRALAX / GLYCOLAX) packet 17 g  17 g Oral Daily Val Riles, MD   17 g at 03/31/22 1015   prazosin (MINIPRESS) capsule 3 mg  3 mg Oral QHS Mansy, Jan A, MD   3 mg at 03/31/22 0018   predniSONE (DELTASONE) tablet 40 mg  40 mg Oral Q breakfast Mansy, Jan A, MD   40 mg at 03/31/22 1010   senna-docusate (Senokot-S) tablet 2 tablet  2 tablet Oral BID PRN Mansy, Jan A, MD       sodium chloride tablet 1 g  1 g Oral TID WC Mansy, Jan A, MD   1 g at 03/31/22 1012   torsemide (DEMADEX) tablet 10 mg  10 mg Oral Daily  Mansy, Jan A, MD   10 mg at 03/31/22 1009   traZODone (DESYREL) tablet 25 mg  25 mg Oral QHS PRN Mansy, Jan A, MD   25 mg at 03/28/22 0128   triamcinolone (NASACORT) nasal inhaler 1 spray  1 spray Nasal Daily Mansy, Jan A, MD   1 spray at 03/31/22 1016   venlafaxine XR (EFFEXOR-XR) 24 hr capsule 37.5 mg  37.5 mg Oral Daily Mansy, Jan A, MD   37.5 mg at 03/31/22 1013     Discharge Medications: Please see discharge summary for a list of discharge medications.  Relevant Imaging Results:  Relevant Lab Results:   Additional Information [id number]  Izola Price, RN

## 2022-03-31 NOTE — Progress Notes (Addendum)
Progress Note   Patient: [name] [id number] DOB: 1940/09/18 DOA: 03/27/2022     4 DOS: the patient was seen and examined on 03/31/2022   Brief hospital course: [name] is a 82 y.o. Caucasian female with medical history significant for asthma, GERD, depression, and anxiety, hypertension, dyslipidemia, osteoarthritis and CVA, who presented to the emergency room with acute onset of fall off of the commode at her SNF.  Patient was found to have 85% on room air.  Respiratory failure due to asthma exacerbation.  CT head showed severe frontal scalp soft tissue swelling and scalp subcu hematoma. CT C-spine showed a T1 compression fracture. CT chest showed a stable left lower thyroid mass 2.4 cm left adrenal adenoma, midsternal mass which is larger than 2016. Patient is placed on steroids, bronchodilators for asthma exacerbation.  Consult from oncology obtained.  No intervention is warranted.   Principal Problem:   Acute respiratory failure with hypoxia (HCC) Active Problems:   Fall   Hypothyroidism   Essential hypertension, benign   Gastroesophageal reflux disease without esophagitis   Peripheral neuropathy   Hemiparesis affecting left side as late effect of cerebrovascular accident (CVA) (HCC)   Asthma, chronic, unspecified asthma severity, with acute exacerbation   Thymoma   Head contusion   Traumatic compression fracture of T1 vertebra (HCC)   Obesity (BMI 30-39.9)   Assessment and Plan:  Acute respiratory failure with hypoxia (HCC) acute asthma exacerbation. Condition improving, discussed with RN, will wean off oxygen.  Will complete 4 days of oral steroids.   Mediastinal mass most likely due to thymoma. Thyroid mass. Adrenal mass. Seen by oncology, no biopsy or intervention due to slow growth and overall poor prognosis. Patient has been seen by palliative care due to prognosis..  Discussion still ongoing.   Fall T1 compression fracture. Head  contusion. Pending nursing home placement, nursing probably can take her on Monday.   Left hemiparesis secondary to old CVA. Continue PT/OT.     Hypothyroidism continue Synthroid.   Essential hypertension, benign continue her antihypertensives.   Gastroesophageal reflux disease without esophagitis  continue PPI therapy.   Peripheral neuropathy - We will continue her Neurontin.      Subjective:  Last bowel movement 3 days ago, giving lactulose.  Otherwise no complaint.  Physical Exam: Vitals:   03/30/22 1513 03/30/22 2126 03/31/22 0018 03/31/22 0821  BP: (!) 141/74 (!) 154/77 (!) 178/91 (!) 131/58  Pulse: 92 99 99 94  Resp: 16 20  18   Temp: 98.7 F (37.1 C) 97.8 F (36.6 C)  98.7 F (37.1 C)  TempSrc:  Oral    SpO2: 96% 97%  93%  Weight:      Height:       General exam: Appears calm and comfortable  Respiratory system: Clear to auscultation. Respiratory effort normal. Cardiovascular system: S1 & S2 heard, RRR. No JVD, murmurs, rubs, gallops or clicks. No pedal edema. Gastrointestinal system: Abdomen is nondistended, soft and nontender. No organomegaly or masses felt. Normal bowel sounds heard. Central nervous system: Alert and oriented. No focal neurological deficits. Extremities: Symmetric 5 x 5 power. Skin: No rashes, lesions or ulcers Psychiatry: Judgement and insight appear normal. Mood & affect appropriate.    Data Reviewed:  There are no new results to review at this time.  Family Communication: daughter updated at bedside  Disposition: Status is: Inpatient Remains inpatient appropriate because: Unsafe discharge.     Time spent: 35 minutes  Author: Sharen Hones, MD 03/31/2022  12:34 PM  For on call review [email or web address].

## 2022-04-01 DIAGNOSIS — S0510XA Contusion of eyeball and orbital tissues, unspecified eye, initial encounter: Secondary | ICD-10-CM | POA: Diagnosis not present

## 2022-04-01 DIAGNOSIS — J45901 Unspecified asthma with (acute) exacerbation: Secondary | ICD-10-CM | POA: Diagnosis not present

## 2022-04-01 DIAGNOSIS — J9601 Acute respiratory failure with hypoxia: Secondary | ICD-10-CM | POA: Diagnosis not present

## 2022-04-01 LAB — BASIC METABOLIC PANEL
Anion gap: 11 (ref 5–15)
BUN: 28 mg/dL — ABNORMAL HIGH (ref 8–23)
CO2: 30 mmol/L (ref 22–32)
Calcium: 10.7 mg/dL — ABNORMAL HIGH (ref 8.9–10.3)
Chloride: 94 mmol/L — ABNORMAL LOW (ref 98–111)
Creatinine, Ser: 0.88 mg/dL (ref 0.44–1.00)
GFR, Estimated: >60 mL/min (ref >60)
Glucose, Bld: 87 mg/dL (ref 70–99)
Potassium: 4.1 mmol/L (ref 3.5–5.1)
Sodium: 135 mmol/L (ref 135–145)

## 2022-04-01 LAB — CBC
HCT: 30.7 % — ABNORMAL LOW (ref 36.0–46.0)
Hemoglobin: 10 g/dL — ABNORMAL LOW (ref 12.0–15.0)
MCH: 30 pg (ref 26.0–34.0)
MCHC: 32.6 g/dL (ref 30.0–36.0)
MCV: 92.2 fL (ref 80.0–100.0)
Platelets: 207 10*3/uL (ref 150–400)
RBC: 3.33 MIL/uL — ABNORMAL LOW (ref 3.87–5.11)
RDW: 13 % (ref 11.5–15.5)
WBC: 7.3 10*3/uL (ref 4.0–10.5)
nRBC: 0 % (ref 0.0–0.2)

## 2022-04-01 MED ORDER — OXYCODONE-ACETAMINOPHEN 5-325 MG PO TABS: 0.5000 | ORAL_TABLET | Freq: Four times a day (QID) | ORAL | 0 refills | Status: AC | PRN

## 2022-04-01 MED ORDER — LACTULOSE 10 GM/15ML PO SOLN
20.0000 g | Freq: Once | ORAL | Status: AC
  Administered 2022-04-01: 20 g via ORAL
  Filled 2022-04-01: qty 30

## 2022-04-01 NOTE — Discharge Summary (Signed)
Physician Discharge Summary   Patient: [name] MRN: [id number] DOB: May 01, 1940  Admit date:     03/27/2022  Discharge date: 04/01/22  Discharge Physician: [name]   PCP: Margaret Morin, MD   Recommendations at discharge:   Follow-up with PCP in 1 week. Refer to palliative care.  Discharge Diagnoses: Principal Problem:   Acute respiratory failure with hypoxia ([place]) Active Problems:   Fall   Hypothyroidism   Essential hypertension, benign   Gastroesophageal reflux disease without esophagitis   Peripheral neuropathy   Hemiparesis affecting left side as late effect of cerebrovascular accident (CVA) (HCC)   Asthma, chronic, unspecified asthma severity, with acute exacerbation   Thymoma   Head contusion   Traumatic compression fracture of T1 vertebra (HCC)   Obesity (BMI 30-39.9) Mild hypercalcemia. Chronic kidney disease stage II.  Resolved Problems:   * No resolved hospital problems. * Mild hyperkalemia. Hyponatremia. Hospital Course: ancy J [address] [name] is a 82 y.o. Caucasian female with medical history significant for asthma, GERD, depression, and anxiety, hypertension, dyslipidemia, osteoarthritis and CVA, who presented to the emergency room with acute onset of fall off of the commode at her SNF.  Patient was found to have 85% on room air.  Respiratory failure due to asthma exacerbation.  CT head showed severe frontal scalp soft tissue swelling and scalp subcu hematoma. CT C-spine showed a T1 compression fracture. CT chest showed a stable left lower thyroid mass 2.4 cm left adrenal adenoma, midsternal mass which is larger than 2016. Patient is placed on steroids, bronchodilators for asthma exacerbation.  Consult from oncology obtained.  No intervention is warranted.  Assessment and Plan: Acute respiratory failure with hypoxia (HCC) acute asthma exacerbation. Condition improved, completed 4 days of steroids.  Patient was on as needed oxygen, will  continue in the nursing home.   Mediastinal mass most likely due to thymoma. Thyroid mass. Adrenal mass. Seen by oncology, no biopsy or intervention due to slow growth and overall poor prognosis. Patient has been seen by palliative care due to prognosis..  Please continue follow-up with primary care to discuss about CODE STATUS.     Fall T1 compression fracture. Head contusion. Continue as needed pain medicine.   Left hemiparesis secondary to old CVA. Continue PT/OT.     Hypothyroidism continue Synthroid.   Essential hypertension, benign continue her antihypertensives.   Gastroesophageal reflux disease without esophagitis  continue PPI therapy.   Peripheral neuropathy - We will continue her Neurontin.         Consultants: None Procedures performed: None  Disposition: Long term care facility Diet recommendation:  Discharge Diet Orders (From admission, onward)     Start     Ordered   04/01/22 0000  Diet - low sodium heart healthy        04/01/22 1353           Cardiac diet DISCHARGE MEDICATION: Allergies as of 04/01/2022       Reactions   Tizanidine Other (See Comments)   HYPOTENSION AND UNRESPONSIVENESS   Ace Inhibitors Cough   Latex Hives   Lisinopril Cough   Tetracycline Other (See Comments)   REACTION: yeast   Zanaflex [tizanidine Hcl] Other (See Comments)   "drowsy"        Medication List     STOP taking these medications    Calcium Carbonate-Vitamin D 600-400 MG-UNIT tablet   gabapentin 800 MG tablet Commonly known as: NEURONTIN   spironolactone 50 MG tablet Commonly known as: ALDACTONE  Theratears 0.25 % Soln Generic drug: Carboxymethylcellulose Sodium   UNABLE TO FIND       TAKE these medications    acetaminophen 500 MG tablet Commonly known as: TYLENOL Take 1,000 mg by mouth every 6 (six) hours as needed (osteoarthritis of left shoulder).   alum & mag hydroxide-simeth [id number] MG/5ML suspension Commonly known as:  MAALOX PLUS Take 30 mLs by mouth every 4 (four) hours as needed for indigestion.   bisacodyl 5 MG EC tablet Commonly known as: DULCOLAX Take 5 mg by mouth every other day.   carvedilol 3.125 MG tablet Commonly known as: COREG Take 3.125 mg by mouth 2 (two) times daily.   demeclocycline 300 MG tablet Commonly known as: DECLOMYCIN Take 300 mg by mouth 2 (two) times daily.   dipyridamole-aspirin 200-25 MG 12hr capsule Commonly known as: AGGRENOX Take 1 capsule by mouth every 12 (twelve) hours.   divalproex 125 MG DR tablet Commonly known as: DEPAKOTE Take 125 mg by mouth 2 (two) times daily.   DOK 100 MG capsule Generic drug: docusate sodium Take 100 mg by mouth daily.   ENDIT EX Apply liberal amount topically to areas of skin irritation as needed. Ok to leave at bedside.   Eucerin Itch Relief 0.1 % Lotn Generic drug: Menthol (Topical Analgesic) Apply topically 2 (two) times daily. To dry area on left breast   guaiFENesin 600 MG 12 hr tablet Commonly known as: MUCINEX Take 2 tablets (1,200 mg total) by mouth 2 (two) times daily as needed. Home med.   hydrocortisone cream 1 % Apply 1 Application topically 2 (two) times daily. hemorrhoids   ipratropium-albuterol 0.5-2.5 (3) MG/3ML Soln Commonly known as: DUONEB Take 3 mLs by nebulization every 6 (six) hours as needed.   levothyroxine 75 MCG tablet Commonly known as: SYNTHROID Take 75 mcg by mouth daily before breakfast.   loratadine 10 MG tablet Commonly known as: CLARITIN Take 10 mg by mouth daily.   montelukast 10 MG tablet Commonly known as: SINGULAIR Take 10 mg by mouth at bedtime.   nystatin powder Generic drug: nystatin Apply topically 2 (two) times daily.   omeprazole 20 MG capsule Commonly known as: PRILOSEC Take 20 mg by mouth in the morning.   oxyCODONE-acetaminophen 5-325 MG tablet Commonly known as: PERCOCET/ROXICET Take 0.5 tablets by mouth every 6 (six) hours as needed for moderate pain.    OXYGEN O2 as needed.  Titrate to keep sat > / = 92   polyethylene glycol 17 g packet Commonly known as: MIRALAX / GLYCOLAX Take 17 g by mouth daily. dx: constipation  Hold if loose stool   prazosin 1 MG capsule Commonly known as: MINIPRESS Take 3 mg by mouth at bedtime. 3 capsules   sennosides-docusate sodium 8.6-50 MG tablet Commonly known as: SENOKOT-S Take 2 tablets by mouth 2 (two) times daily as needed for constipation. Also take 1 tab by mouth at bedtime daily.   sodium chloride 1 g tablet Take 1 tablet (1 g total) by mouth 3 (three) times daily with meals.   Systane Balance 0.6 % Soln Generic drug: Propylene Glycol Place 1 drop into both eyes 2 (two) times daily.   torsemide 10 MG tablet Commonly known as: DEMADEX Take 10 mg by mouth daily.   triamcinolone 55 MCG/ACT Aero nasal inhaler Commonly known as: NASACORT Place 1 spray into the nose daily.   venlafaxine XR 37.5 MG 24 hr capsule Commonly known as: EFFEXOR-XR Take 37.5 mg by mouth daily.   Vitamin D3 125  MCG (5000 UT) capsule Generic drug: Cholecalciferol Take 5,000 Units by mouth daily.        Contact information for follow-up providers     Margaret Morin, MD Follow up in 1 week(s).   Specialty: Family Medicine Contact information: [place] [address] [phone]              Contact information for after-discharge care     Destination     [facility] .   Service: Skilled Nursing Contact information: [address] [place] [phone]                    Discharge Exam: Filed Weights   03/27/22 2038 03/29/22 0419 04/01/22 0500  Weight: 85 kg 84 kg 80.4 kg   General exam: Appears calm and comfortable  Respiratory system: Clear to auscultation. Respiratory effort normal. Cardiovascular system: S1 & S2 heard, RRR. No JVD, murmurs, rubs, gallops or clicks. No pedal  edema. Gastrointestinal system: Abdomen is nondistended, soft and nontender. No organomegaly or masses felt. Normal bowel sounds heard. Central nervous system: Alert and oriented. No focal neurological deficits. Extremities: Symmetric 5 x 5 power. Skin: No rashes, lesions or ulcers Psychiatry: Judgement and insight appear normal. Mood & affect appropriate.    Condition at discharge: fair  The results of significant diagnostics from this hospitalization (including imaging, microbiology, ancillary and laboratory) are listed below for reference.   Imaging Studies: CT Angio Chest PE W and/or Wo Contrast  Result Date: 03/27/2022 CLINICAL DATA:  Pulmonary embolism (PE) suspected, high prob. Chest pain EXAM: CT ANGIOGRAPHY CHEST WITH CONTRAST TECHNIQUE: Multidetector CT imaging of the chest was performed using the standard protocol during bolus administration of intravenous contrast. Multiplanar CT image reconstructions and MIPs were obtained to evaluate the vascular anatomy. RADIATION DOSE REDUCTION: This exam was performed according to the departmental dose-optimization program which includes automated exposure control, adjustment of the mA and/or kV according to patient size and/or use of iterative reconstruction technique. CONTRAST:  43mL OMNIPAQUE IOHEXOL 350 MG/ML SOLN COMPARISON:  11/23/2014 FINDINGS: Cardiovascular: There is adequate opacification of the pulmonary arterial tree. No intraluminal filling defect identified through the segmental level to suggest acute pulmonary embolism. The central pulmonary arteries are of normal caliber. Mild multi-vessel coronary artery calcification. Mild global cardiomegaly, stable. Small pericardial effusion is unchanged. Mild atherosclerotic calcification within the thoracic aorta. No aortic aneurysm. Mediastinum/Nodes: 4.4 x 4.6 cm left thyroid mass appears stable since prior examination, not optimally assessed but likely benign given its stability over time.  This was previously assessed on sonogram of 11/24/2014. In the setting of significant comorbidities or limited life expectancy, no follow-up recommended (ref: J Am Coll Radiol. 2015 Feb;12(2): 143-50).Anterior mediastinal mass has enlarged in the interval since prior examination measuring 2.8 x 3.7 cm at axial image # 55/4, previously measuring 0.7 x 1.2 cm, most in keeping with a thymoma or lymphoma. Stable mass effect upon the proximal esophagus by the thyroid mass. The esophagus is otherwise unremarkable. Moderate hiatal hernia. Lungs/Pleura: Bilateral dependent atelectasis. Ground-glass opacities within the lung apices likely represent atelectasis in this extra tori phase examination. There is marked narrowing of the mainstem bronchi and bronchus intermedius in the AP dimension in keeping with changes of bronchomalacia. No pneumothorax or pleural effusion. Upper Abdomen: Stable 2.4 cm left adrenal adenoma. No follow-up imaging is recommended for this lesion. No acute abnormality within the visualized abdomen. Musculoskeletal: Interval development of an age indeterminate  compression deformity of T1 with approximately 30-40% loss of height. Stable anterior wedge compression deformity of T2. Healed mid sternal fracture noted, new from prior examination. Osseous structures are otherwise age-appropriate. Review of the MIP images confirms the above findings. IMPRESSION: 1. No pulmonary embolism. 2. Mild multi-vessel coronary artery calcification. Stable mild global cardiomegaly. Stable small pericardial effusion. 3. Bronchomalacia involving the mainstem bronchi bilaterally and bronchus intermedius. 4. Interval enlargement of a anterior mediastinal mass most in keeping with a thymoma or lymphoma. Surgical consultation may be helpful for further management. 5. Stable 4.6 cm left thyroid mass, not optimally assessed but likely benign given its stability over time. This was previously assessed on sonogram of 11/24/2014.  In the setting of significant clinical comorbidity or limited life expectancy, no follow-up imaging is recommended. 6. Moderate hiatal hernia. 7. Stable 2.4 cm left adrenal adenoma. 8. Interval development of an age indeterminate compression deformity of T1 with approximately 30-40% loss of height. Correlation for point tenderness is recommended. If indicated, this could be better assessed with MRI examination. Aortic Atherosclerosis (ICD10-I70.0). Electronically Signed   By: Fidela Salisbury M.D.   On: 03/27/2022 22:58   DG Knee Complete 4 Views Right  Result Date: 03/27/2022 CLINICAL DATA:  Fall and trauma to the right knee. EXAM: RIGHT KNEE - COMPLETE 4+ VIEW COMPARISON:  None Available. FINDINGS: No acute fracture or dislocation. The bones are osteopenic. Mild arthritic changes and tricompartmental narrowing. No significant joint effusion. The soft tissues are unremarkable. IMPRESSION: 1. No acute fracture or dislocation. 2. Mild arthritic changes. Electronically Signed   By: Anner Crete M.D.   On: 03/27/2022 21:29   DG Knee Complete 4 Views Left  Result Date: 03/27/2022 CLINICAL DATA:  Fall with bilateral knee pain. EXAM: LEFT KNEE - COMPLETE 4 VIEW COMPARISON:  None Available. FINDINGS: No acute fracture or subluxation. A small joint effusion is possible. Generalized osteopenia. Undulating appearance of the lateral femoral condyle which appears chronic on the frontal view. Generalized osteopenia. IMPRESSION: No acute finding. Electronically Signed   By: Jorje Guild M.D.   On: 03/27/2022 21:29   DG Chest 1 View  Result Date: 03/27/2022 CLINICAL DATA:  Chest pain EXAM: CHEST  1 VIEW COMPARISON:  07/14/2021 FINDINGS: Right diaphragm appears slightly elevated. Linear atelectasis or scar at the left base. Mild cardiomegaly with aortic atherosclerosis. No pleural effusion or pneumothorax. IMPRESSION: Mild cardiomegaly. Linear atelectasis or scar at the left base. Electronically Signed   By: Donavan Foil M.D.   On: 03/27/2022 21:28   DG Forearm Left  Result Date: 03/27/2022 CLINICAL DATA:  Contracture.  Fall. EXAM: LEFT FOREARM - 2 VIEW COMPARISON:  None Available. FINDINGS: Wrist and hand positioning correlating with history of contracture. Generalized osteopenia. No acute fracture or dislocation. IMPRESSION: No acute finding. Electronically Signed   By: Jorje Guild M.D.   On: 03/27/2022 21:27   CT Cervical Spine Wo Contrast  Result Date: 03/27/2022 CLINICAL DATA:  Blunt facial trauma. EXAM: CT CERVICAL SPINE WITHOUT CONTRAST TECHNIQUE: Multidetector CT imaging of the cervical spine was performed without intravenous contrast. Multiplanar CT image reconstructions were also generated. RADIATION DOSE REDUCTION: This exam was performed according to the departmental dose-optimization program which includes automated exposure control, adjustment of the mA and/or kV according to patient size and/or use of iterative reconstruction technique. COMPARISON:  None Available. FINDINGS: Alignment: Exaggerated kyphosis at the cervicothoracic junction. Skull base and vertebrae: T1 body fracture with horizontal fracture plane and impaction. Trace retropulsion. The fracture could be  subacute given the indistinct margins. Remote T2 compression fracture with similar mild to moderate height loss. No acute cervical spine fracture. Soft tissues and spinal canal: Nodule in the left lobe thyroid measuring at least 5.2 cm. This was evaluated by ultrasound 11/24/2014 with biopsy recommended. No worrisome growth given the long interval. No follow-up recommended unless clinically warranted (ref: J Am Coll Radiol. 2015 Feb;12(2): 143-50). Disc levels:  Ordinary degenerative changes Upper chest: No acute finding. IMPRESSION: 1. T1 compression fracture which could be subacute to chronic. Remote T2 compression fracture. 2. Negative for cervical spine fracture or subluxation. Electronically Signed   By: Jorje Guild M.D.    On: 03/27/2022 21:19   CT Head Wo Contrast  Result Date: 03/27/2022 CLINICAL DATA:  Status post trauma. EXAM: CT HEAD WITHOUT CONTRAST TECHNIQUE: Contiguous axial images were obtained from the base of the skull through the vertex without intravenous contrast. RADIATION DOSE REDUCTION: This exam was performed according to the departmental dose-optimization program which includes automated exposure control, adjustment of the mA and/or kV according to patient size and/or use of iterative reconstruction technique. COMPARISON:  November 26, 2014 FINDINGS: Brain: There is moderate severity cerebral atrophy with widening of the extra-axial spaces and ventricular dilatation. There are areas of decreased attenuation within the white matter tracts of the supratentorial brain, consistent with microvascular disease changes. A small, chronic right basal ganglia lacunar infarct is noted. Vascular: No hyperdense vessel or unexpected calcification. Skull: Normal. Negative for fracture or focal lesion. Sinuses/Orbits: No acute finding. Other: Marked severity bifrontal scalp soft tissue swelling is seen along the midline, left greater than right. This extends along the vertex. An associated large, lobulated scalp soft tissue hematoma is noted IMPRESSION: 1. Marked severity frontal scalp soft tissue swelling with an associated large, lobulated scalp soft tissue hematoma. 2. No acute intracranial abnormality. 3. Small, chronic right basal ganglia lacunar infarct. 4. Generalized cerebral atrophy and microvascular disease changes of the supratentorial brain. Electronically Signed   By: Virgina Norfolk M.D.   On: 03/27/2022 21:09    Microbiology: Results for orders placed or performed during the hospital encounter of 03/27/22  Resp panel by RT-PCR (RSV, Flu A&B, Covid) Anterior Nasal Swab     Status: None   Collection Time: 03/27/22 10:14 PM   Specimen: Anterior Nasal Swab  Result Value Ref Range Status   SARS Coronavirus 2  by RT PCR NEGATIVE NEGATIVE Final    Comment: (NOTE) SARS-CoV-2 target nucleic acids are NOT DETECTED.  The SARS-CoV-2 RNA is generally detectable in upper respiratory specimens during the acute phase of infection. The lowest concentration of SARS-CoV-2 viral copies this assay can detect is 138 copies/mL. A negative result does not preclude SARS-Cov-2 infection and should not be used as the sole basis for treatment or other patient management decisions. A negative result may occur with  improper specimen collection/handling, submission of specimen other than nasopharyngeal swab, presence of viral mutation(s) within the areas targeted by this assay, and inadequate number of viral copies(<138 copies/mL). A negative result must be combined with clinical observations, patient history, and epidemiological information. The expected result is Negative.  Fact Sheet for Patients:  EntrepreneurPulse.com.au  Fact Sheet for Healthcare Providers:  IncredibleEmployment.be  This test is no t yet approved or cleared by the [place] FDA and  has been authorized for detection and/or diagnosis of SARS-CoV-2 by FDA under an Emergency Use Authorization (EUA). This EUA will remain  in effect (meaning this test can be used) for the duration  of the COVID-19 declaration under Section 564(b)(1) of the Act, 21 U.S.C.section 360bbb-3(b)(1), unless the authorization is terminated  or revoked sooner.       Influenza A by PCR NEGATIVE NEGATIVE Final   Influenza B by PCR NEGATIVE NEGATIVE Final    Comment: (NOTE) The Xpert Xpress SARS-CoV-2/FLU/RSV plus assay is intended as an aid in the diagnosis of influenza from Nasopharyngeal swab specimens and should not be used as a sole basis for treatment. Nasal washings and aspirates are unacceptable for Xpert Xpress SARS-CoV-2/FLU/RSV testing.  Fact Sheet for Patients: EntrepreneurPulse.com.au  Fact  Sheet for Healthcare Providers: IncredibleEmployment.be  This test is not yet approved or cleared by the [place] FDA and has been authorized for detection and/or diagnosis of SARS-CoV-2 by FDA under an Emergency Use Authorization (EUA). This EUA will remain in effect (meaning this test can be used) for the duration of the COVID-19 declaration under Section 564(b)(1) of the Act, 21 U.S.C. section 360bbb-3(b)(1), unless the authorization is terminated or revoked.     Resp Syncytial Virus by PCR NEGATIVE NEGATIVE Final    Comment: (NOTE) Fact Sheet for Patients: EntrepreneurPulse.com.au  Fact Sheet for Healthcare Providers: IncredibleEmployment.be  This test is not yet approved or cleared by the [place] FDA and has been authorized for detection and/or diagnosis of SARS-CoV-2 by FDA under an Emergency Use Authorization (EUA). This EUA will remain in effect (meaning this test can be used) for the duration of the COVID-19 declaration under Section 564(b)(1) of the Act, 21 U.S.C. section 360bbb-3(b)(1), unless the authorization is terminated or revoked.  Performed at [facility], [place], [place] [address]   Respiratory (~20 pathogens) panel by PCR     Status: None   Collection Time: 03/28/22  6:42 PM   Specimen: Nasopharyngeal Swab; Respiratory  Result Value Ref Range Status   Adenovirus NOT DETECTED NOT DETECTED Final   Coronavirus 229E NOT DETECTED NOT DETECTED Final    Comment: (NOTE) The Coronavirus on the Respiratory Panel, DOES NOT test for the novel  Coronavirus (2019 nCoV)    Coronavirus HKU1 NOT DETECTED NOT DETECTED Final   Coronavirus NL63 NOT DETECTED NOT DETECTED Final   Coronavirus OC43 NOT DETECTED NOT DETECTED Final   Metapneumovirus NOT DETECTED NOT DETECTED Final   Rhinovirus / Enterovirus NOT DETECTED NOT DETECTED Final   Influenza A NOT DETECTED NOT DETECTED Final    Influenza B NOT DETECTED NOT DETECTED Final   Parainfluenza Virus 1 NOT DETECTED NOT DETECTED Final   Parainfluenza Virus 2 NOT DETECTED NOT DETECTED Final   Parainfluenza Virus 3 NOT DETECTED NOT DETECTED Final   Parainfluenza Virus 4 NOT DETECTED NOT DETECTED Final   Respiratory Syncytial Virus NOT DETECTED NOT DETECTED Final   Bordetella pertussis NOT DETECTED NOT DETECTED Final   Bordetella Parapertussis NOT DETECTED NOT DETECTED Final   Chlamydophila pneumoniae NOT DETECTED NOT DETECTED Final   Mycoplasma pneumoniae NOT DETECTED NOT DETECTED Final    Comment: Performed at [facility], [place]. [address], [place], [place] [address]    Labs: CBC: Recent Labs  Lab 03/28/22 0701 03/29/22 0704 03/30/22 0543 03/31/22 0434 04/01/22 0737  WBC 7.9 7.5 9.1 7.3 7.3  HGB 11.6* 10.0* 9.6* 9.6* 10.0*  HCT 36.6 30.0* 30.1* 29.5* 30.7*  MCV 93.8 92.0 92.9 93.1 92.2  PLT 215 190 214 209 [id number]   Basic Metabolic Panel: Recent Labs  Lab 03/28/22 0701 03/29/22 0704 03/30/22 0543 03/31/22 0434 04/01/22 0737  NA 136 140  134* 134* 135  K 5.3* 4.3 4.2 4.2 4.1  CL 101 98 98 100 94*  CO2 27 29 28 28 30   GLUCOSE 134* 98 98 95 87  BUN 25* 27* 29* 29* 28*  CREATININE 0.93 0.87 0.98 1.03* 0.88  CALCIUM 10.0 10.6* 10.0 10.2 10.7*  MG  --  1.9 2.0 1.9  --   PHOS  --  3.4 3.1 4.0  --    Liver Function Tests: No results for input(s): "AST", "ALT", "ALKPHOS", "BILITOT", "PROT", "ALBUMIN" in the last 168 hours. CBG: No results for input(s): "GLUCAP" in the last 168 hours.  Discharge time spent: greater than 30 minutes.  Signed: Sharen Hones, MD [facility] 04/01/2022

## 2022-04-01 NOTE — Care Management Important Message (Signed)
Important Message  Patient Details  Name: [name] MRN: [id number] Date of Birth: [birth year]   Medicare Important Message Given:  Yes     Dannette Barbara 04/01/2022, 11:36 AM

## 2022-04-01 NOTE — Plan of Care (Signed)
Problem: Education: Goal: Knowledge of General Education information will improve Description: Including pain rating scale, medication(s)/side effects and non-pharmacologic comfort measures 04/01/2022 1801 by Brionna Romanek Bet, LPN Outcome: Adequate for Discharge 04/01/2022 1119 by Adrion Menz Bet, LPN Outcome: Progressing   Problem: Health Behavior/Discharge Planning: Goal: Ability to manage health-related needs will improve 04/01/2022 1801 by Jobina Maita Bet, LPN Outcome: Adequate for Discharge 04/01/2022 1119 by Alexzavier Girardin Bet, LPN Outcome: Progressing   Problem: Clinical Measurements: Goal: Ability to maintain clinical measurements within normal limits will improve 04/01/2022 1801 by Kyesha Balla Bet, LPN Outcome: Adequate for Discharge 04/01/2022 1119 by Doss Cybulski Bet, LPN Outcome: Progressing Goal: Will remain free from infection 04/01/2022 1801 by Micheala Morissette Bet, LPN Outcome: Adequate for Discharge 04/01/2022 1119 by Mendy Chou Bet, LPN Outcome: Progressing Goal: Diagnostic test results will improve 04/01/2022 1801 by Daemian Gahm Bet, LPN Outcome: Adequate for Discharge 04/01/2022 1119 by Demarcus Thielke Bet, LPN Outcome: Progressing Goal: Respiratory complications will improve 04/01/2022 1801 by Gracieann Stannard Bet, LPN Outcome: Adequate for Discharge 04/01/2022 1119 by Adalida Garver Bet, LPN Outcome: Progressing Goal: Cardiovascular complication will be avoided 04/01/2022 1801 by Skyy Mcknight Bet, LPN Outcome: Adequate for Discharge 04/01/2022 1119 by Caidon Foti Bet, LPN Outcome: Progressing   Problem: Activity: Goal: Risk for activity intolerance will decrease 04/01/2022 1801 by Ciel Yanes Bet, LPN Outcome: Adequate for Discharge 04/01/2022 1119 by Hulon Ferron Bet, LPN Outcome: Progressing   Problem: Nutrition: Goal: Adequate nutrition will be maintained 04/01/2022 1801 by Harbor Paster Bet, LPN Outcome: Adequate for Discharge 04/01/2022 1119 by Suleiman Finigan Bet, LPN Outcome: Progressing    Problem: Coping: Goal: Level of anxiety will decrease 04/01/2022 1801 by Dovie Kapusta Bet, LPN Outcome: Adequate for Discharge 04/01/2022 1119 by Asiya Cutbirth Bet, LPN Outcome: Progressing   Problem: Elimination: Goal: Will not experience complications related to bowel motility 04/01/2022 1801 by Shedric Fredericks Bet, LPN Outcome: Adequate for Discharge 04/01/2022 1119 by Keola Heninger Bet, LPN Outcome: Progressing Goal: Will not experience complications related to urinary retention 04/01/2022 1801 by Kaytelyn Glore Bet, LPN Outcome: Adequate for Discharge 04/01/2022 1119 by Jalon Blackwelder Bet, LPN Outcome: Progressing   Problem: Pain Managment: Goal: General experience of comfort will improve 04/01/2022 1801 by Morene Cecilio Bet, LPN Outcome: Adequate for Discharge 04/01/2022 1119 by Christ Fullenwider Bet, LPN Outcome: Progressing   Problem: Safety: Goal: Ability to remain free from injury will improve 04/01/2022 1801 by Hillman Attig Bet, LPN Outcome: Adequate for Discharge 04/01/2022 1119 by Earley Grobe Bet, LPN Outcome: Progressing   Problem: Skin Integrity: Goal: Risk for impaired skin integrity will decrease 04/01/2022 1801 by Mianna Iezzi Bet, LPN Outcome: Adequate for Discharge 04/01/2022 1119 by Kimyetta Flott Bet, LPN Outcome: Progressing   Problem: Education: Goal: Knowledge of General Education information will improve Description: Including pain rating scale, medication(s)/side effects and non-pharmacologic comfort measures 04/01/2022 1801 by Eriyanna Kofoed Bet, LPN Outcome: Adequate for Discharge 04/01/2022 1119 by Khalel Alms Bet, LPN Outcome: Progressing   Problem: Health Behavior/Discharge Planning: Goal: Ability to manage health-related needs will improve 04/01/2022 1801 by Kazuki Ingle Bet, LPN Outcome: Adequate for Discharge 04/01/2022 1119 by Damascus Feldpausch Bet, LPN Outcome: Progressing   Problem: Clinical Measurements: Goal: Ability to maintain clinical measurements within normal limits will  improve 04/01/2022 1801 by Mikhayla Phillis Bet, LPN Outcome: Adequate for Discharge 04/01/2022 1119 by Ebelin Dillehay Bet, LPN Outcome: Progressing Goal: Will remain free from infection 04/01/2022 1801 by Cesario Weidinger Bet, LPN Outcome: Adequate for Discharge 04/01/2022 1119 by  Halcyon Heck Bet, LPN Outcome: Progressing Goal: Diagnostic test results will improve 04/01/2022 1801 by Ishia Tenorio Bet, LPN Outcome: Adequate for Discharge 04/01/2022 1119 by Annaliah Rivenbark Bet, LPN Outcome: Progressing Goal: Respiratory complications will improve 04/01/2022 1801 by Agastya Meister Bet, LPN Outcome: Adequate for Discharge 04/01/2022 1119 by Jamila Slatten Bet, LPN Outcome: Progressing Goal: Cardiovascular complication will be avoided 04/01/2022 1801 by Jayleah Garbers Bet, LPN Outcome: Adequate for Discharge 04/01/2022 1119 by Andy Moye Bet, LPN Outcome: Progressing   Problem: Activity: Goal: Risk for activity intolerance will decrease 04/01/2022 1801 by Raynald Rouillard Bet, LPN Outcome: Adequate for Discharge 04/01/2022 1119 by Din Bookwalter Bet, LPN Outcome: Progressing   Problem: Nutrition: Goal: Adequate nutrition will be maintained 04/01/2022 1801 by Valecia Beske Bet, LPN Outcome: Adequate for Discharge 04/01/2022 1119 by Joelly Bolanos Bet, LPN Outcome: Progressing   Problem: Coping: Goal: Level of anxiety will decrease 04/01/2022 1801 by Thierno Hun Bet, LPN Outcome: Adequate for Discharge 04/01/2022 1119 by Kilea Mccarey Bet, LPN Outcome: Progressing   Problem: Elimination: Goal: Will not experience complications related to bowel motility 04/01/2022 1801 by Dulcey Riederer Bet, LPN Outcome: Adequate for Discharge 04/01/2022 1119 by Karl Knarr Bet, LPN Outcome: Progressing Goal: Will not experience complications related to urinary retention 04/01/2022 1801 by Athira Janowicz Bet, LPN Outcome: Adequate for Discharge 04/01/2022 1119 by Yuepheng Schaller Bet, LPN Outcome: Progressing   Problem: Pain Managment: Goal: General experience of comfort  will improve 04/01/2022 1801 by Adith Tejada Bet, LPN Outcome: Adequate for Discharge 04/01/2022 1119 by Jazlynn Nemetz Bet, LPN Outcome: Progressing   Problem: Safety: Goal: Ability to remain free from injury will improve 04/01/2022 1801 by Cayleb Jarnigan Bet, LPN Outcome: Adequate for Discharge 04/01/2022 1119 by Mikiah Durall Bet, LPN Outcome: Progressing   Problem: Skin Integrity: Goal: Risk for impaired skin integrity will decrease 04/01/2022 1801 by Travontae Freiberger Bet, LPN Outcome: Adequate for Discharge 04/01/2022 1119 by Avleen Bordwell Bet, LPN Outcome: Progressing   Problem: Acute Rehab OT Goals (only OT should resolve) Goal: Pt. Will Perform Grooming Outcome: Adequate for Discharge Goal: Pt. Will Transfer To Toilet Outcome: Adequate for Discharge   Problem: Acute Rehab PT Goals(only PT should resolve) Goal: Pt Will Transfer Bed To Chair/Chair To Bed Outcome: Adequate for Discharge Goal: Pt Will Ambulate Outcome: Adequate for Discharge   Problem: Increased Nutrient Needs (NI-5.1) Goal: Food and/or nutrient delivery Description: Individualized approach for food/nutrient provision. Outcome: Adequate for Discharge

## 2022-04-01 NOTE — TOC Progression Note (Signed)
Transition of Care [facility]) - Progression Note    Patient Details  Name: [name] MRN: [id number] Date of Birth: 1940-04-19  Transition of Care [facility]) CM/SW [place], RN Phone Number: 04/01/2022, 2:47 PM  Clinical Narrative:     Damaris Schooner with [facility] the patient;s daughter and notified her that [facility] to [facility] F5 bed A  EMS called and she is 6th on the list       Expected Discharge Plan and Services         Expected Discharge Date: 04/01/22                                     Social Determinants of Health ([place]) Interventions SDOH Screenings   Food Insecurity: No Food Insecurity (03/28/2022)  Housing: Low Risk  (03/28/2022)  Transportation Needs: No Transportation Needs (03/28/2022)  Utilities: Not At Risk (03/28/2022)  Tobacco Use: Medium Risk (03/27/2022)    Readmission Risk Interventions     No data to display

## 2022-04-01 NOTE — TOC Progression Note (Signed)
Transition of Care [facility]) - Progression Note    Patient Details  Name: [name] MRN: [id number] Date of Birth: 1940-04-21  Transition of Care [facility]) CM/SW [place], RN Phone Number: 04/01/2022, 1:56 PM  Clinical Narrative:    Confirmed that the patient is a LTC resident at [facility] and can DC today        Expected Discharge Plan and Services         Expected Discharge Date: 04/01/22                                     Social Determinants of Health ([place]) Interventions SDOH Screenings   Food Insecurity: No Food Insecurity (03/28/2022)  Housing: Low Risk  (03/28/2022)  Transportation Needs: No Transportation Needs (03/28/2022)  Utilities: Not At Risk (03/28/2022)  Tobacco Use: Medium Risk (03/27/2022)    Readmission Risk Interventions     No data to display

## 2022-04-01 NOTE — Plan of Care (Signed)
  Problem: Education: Goal: Knowledge of General Education information will improve Description: Including pain rating scale, medication(s)/side effects and non-pharmacologic comfort measures Outcome: Progressing   Problem: Pain Managment: Goal: General experience of comfort will improve Outcome: Progressing   Problem: Elimination: Goal: Will not experience complications related to bowel motility Outcome: Progressing   Problem: Coping: Goal: Level of anxiety will decrease Outcome: Progressing   Problem: Activity: Goal: Risk for activity intolerance will decrease Outcome: Progressing   Problem: Elimination: Goal: Will not experience complications related to bowel motility Outcome: Progressing   Problem: Safety: Goal: Ability to remain free from injury will improve Outcome: Progressing   Problem: Skin Integrity: Goal: Risk for impaired skin integrity will decrease Outcome: Progressing

## 2022-04-01 NOTE — Progress Notes (Addendum)
Attempted to contact [facility] facility twice. Attempted not successful. Provided a call back number via voicemail.

## 2022-04-01 NOTE — Progress Notes (Signed)
Administered tap water enema per MD's order.

## 2022-04-01 NOTE — Progress Notes (Signed)
EMS transported pt from [facility] to [facility].  

## 2022-07-19 ENCOUNTER — Other Ambulatory Visit: Payer: Self-pay | Admitting: Internal Medicine

## 2022-07-19 DIAGNOSIS — R0602 Shortness of breath: Secondary | ICD-10-CM

## 2022-07-24 ENCOUNTER — Other Ambulatory Visit: Payer: Medicare Other

## 2022-07-25 ENCOUNTER — Other Ambulatory Visit: Payer: Medicare Other

## 2022-09-25 ENCOUNTER — Other Ambulatory Visit: Payer: Self-pay | Admitting: Internal Medicine

## 2022-09-25 DIAGNOSIS — R0602 Shortness of breath: Secondary | ICD-10-CM

## 2022-12-12 ENCOUNTER — Ambulatory Visit (INDEPENDENT_AMBULATORY_CARE_PROVIDER_SITE_OTHER): Payer: Medicare Other | Admitting: Nurse Practitioner

## 2022-12-12 ENCOUNTER — Ambulatory Visit (INDEPENDENT_AMBULATORY_CARE_PROVIDER_SITE_OTHER): Payer: PPO

## 2022-12-12 ENCOUNTER — Other Ambulatory Visit (INDEPENDENT_AMBULATORY_CARE_PROVIDER_SITE_OTHER): Payer: Self-pay | Admitting: Nurse Practitioner

## 2022-12-12 ENCOUNTER — Encounter (INDEPENDENT_AMBULATORY_CARE_PROVIDER_SITE_OTHER): Payer: Self-pay | Admitting: Nurse Practitioner

## 2022-12-12 VITALS — BP 133/78 | HR 105 | Resp 18 | Wt 177.0 lb

## 2022-12-12 DIAGNOSIS — L819 Disorder of pigmentation, unspecified: Secondary | ICD-10-CM

## 2022-12-12 DIAGNOSIS — I69354 Hemiplegia and hemiparesis following cerebral infarction affecting left non-dominant side: Secondary | ICD-10-CM | POA: Diagnosis not present

## 2022-12-12 DIAGNOSIS — I10 Essential (primary) hypertension: Secondary | ICD-10-CM | POA: Diagnosis not present

## 2022-12-12 DIAGNOSIS — I739 Peripheral vascular disease, unspecified: Secondary | ICD-10-CM

## 2022-12-16 NOTE — Progress Notes (Signed)
Subjective:    Patient ID: [name], female    DOB: 02-Apr-1940, 82 y.o.   MRN: [id number] Chief Complaint  Patient presents with   New Patient (Initial Visit)   Follow-up    NP. consult. edema/temp change both feet. slade-hartman    The patient is a 82 year old female who presents today for evaluation after recent podiatrist visit to her nursing facility.  Initially the consult mention swelling but she notes that she does not have significant swelling although her left lower extremity does have some issues with swelling at times.  Her largest concern is regarding discoloration of her left lower extremity.  She denies any rest pain.  She does not walk so denies claudication.  There are currently no open wounds or ulcerations.  Today noninvasive study shows biphasic/triphasic waveforms throughout the right lower extremity with no evidence of occlusion.  There are also some slight diminished toe waveforms as well.    Review of Systems  Cardiovascular:  Positive for leg swelling.  Skin:  Positive for color change.  Neurological:  Positive for weakness.  All other systems reviewed and are negative.      Objective:   Physical Exam Vitals reviewed.  HENT:     Head: Normocephalic.  Cardiovascular:     Rate and Rhythm: Normal rate.  Pulmonary:     Effort: Pulmonary effort is normal.  Musculoskeletal:     Left lower leg: Edema present.  Skin:    General: Skin is warm and dry.     Capillary Refill: Capillary refill takes 2 to 3 seconds.  Neurological:     Mental Status: She is alert and oriented to person, place, and time.  Psychiatric:        Mood and Affect: Mood normal.        Behavior: Behavior normal.        Thought Content: Thought content normal.        Judgment: Judgment normal.     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    Depression with anxiety    GERD (gastroesophageal reflux disease)    Hemiparesis affecting left side as late effect of cerebrovascular accident (CVA) (HCC)    Hot flashes    Hyperlipidemia    Hypertension    Muscle hypertonicity    Osteoarthritis    Stroke (HCC)    TIA (transient ischemic attack)     Social History   Socioeconomic History   Marital status: Married    Spouse name: Not on file   Number of children: Not on file   Years of education: Not on file   Highest education level: Not on file  Occupational History   Occupation: retired  Tobacco Use   Smoking status: Former    Current packs/day: 0.00    Average packs/day: 0.5 packs/day for 3.0 years (1.5 ttl pk-yrs)    Types: Cigarettes    Start date: 01/08/1960    Quit date: 01/08/1963    Years since quitting: 59.9   Smokeless tobacco: Never  Vaping Use   Vaping status: Never Used  Substance and Sexual Activity   Alcohol use: No    Alcohol/week: 0.0 standard drinks of alcohol    Comment: does  not drink alcohol   Drug use: No   Sexual activity: Not on file  Other Topics Concern   Not on file  Social History Narrative   Admitted to [facility] [place] 11/30/2014   Married   Former smoker   Full Code   Social Determinants of Corporate investment banker Strain: Not on file  Food Insecurity: No Food Insecurity (03/28/2022)   Hunger Vital Sign    Worried About Running Out of Food in the Last Year: Never true    Ran Out of Food in the Last Year: Never true  Transportation Needs: No Transportation Needs (03/28/2022)   PRAPARE - Administrator, Civil Service (Medical): No    Lack of Transportation (Non-Medical): No  Physical Activity: Not on file  Stress: Not on file  Social Connections: Not on file  Intimate Partner Violence: Not At Risk (03/28/2022)   Humiliation, Afraid, Rape, and Kick questionnaire    Fear of Current or Ex-Partner: No    Emotionally  Abused: No    Physically Abused: No    Sexually Abused: No    Past Surgical History:  Procedure Laterality Date   ACHILLES TENDON LENGTHENING  08/25/2015   Procedure: ACHILLES TENDON LENGTHENING;  Surgeon: Gwyneth Revels, DPM;  Location: [facility] ORS;  Service: Podiatry;;   APPENDECTOMY     COLONOSCOPY  06/17/2008   EYE SURGERY Bilateral    Cataract Extraction with IOL   FLAT FOOT RECONSTRUCTION-TAL GASTROC RECESSION Left 08/25/2015   Procedure: ENDOSCOPIC FLAT FOOT RECONSTRUCTION-TAL GASTROC RECESSION;  Surgeon: Gwyneth Revels, DPM;  Location: [facility] ORS;  Service: Podiatry;  Laterality: Left;   VAGINAL HYSTERECTOMY      Family History  Problem Relation Age of Onset   Alcohol abuse Mother    Coronary artery disease Mother    Stroke Mother    Liver cancer Father    Alcohol abuse Brother    Coronary artery disease Brother    Diabetes type II Brother    Liver cancer Brother     Allergies  Allergen Reactions   Tizanidine Other (See Comments)    HYPOTENSION AND UNRESPONSIVENESS   Ace Inhibitors Cough   Latex Hives   Lisinopril Cough   Tetracycline Other (See Comments)     REACTION: yeast   Zanaflex [Tizanidine Hcl] Other (See Comments)    "drowsy"       Latest Ref Rng & Units 04/01/2022    7:37 AM 03/31/2022    4:34 AM 03/30/2022    5:43 AM  CBC  WBC 4.0 - 10.5 K/uL 7.3  7.3  9.1   Hemoglobin 12.0 - 15.0 g/dL 16.1  9.6  9.6   Hematocrit 36.0 - 46.0 % 30.7  29.5  30.1   Platelets 150 - 400 K/uL 207  209  214       CMP     Component Value Date/Time   NA 135 04/01/2022 0737   NA 137 04/06/2013 0652   K 4.1 04/01/2022 0737   K 4.6 04/06/2013 0652   CL 94 (L) 04/01/2022 0737   CL 103 04/06/2013 0652   CO2 30 04/01/2022 0737   CO2 32 04/06/2013 0652   GLUCOSE 87 04/01/2022 0737   GLUCOSE 74 04/06/2013 0652   BUN 28 (H) 04/01/2022 0737   BUN 11 04/06/2013 0652   CREATININE 0.88 04/01/2022 0737   CREATININE 0.88 04/06/2013 0652   CALCIUM 10.7 (H) 04/01/2022 0737    CALCIUM 9.4 04/06/2013 0652   PROT 6.8 07/14/2021  2254   PROT 7.8 03/25/2013 1347   ALBUMIN 3.6 07/14/2021 2254   ALBUMIN 4.3 03/25/2013 1347   AST 17 07/14/2021 2254   AST 27 03/25/2013 1347   ALT 14 07/14/2021 2254   ALT 24 03/25/2013 1347   ALKPHOS 76 07/14/2021 2254   ALKPHOS 66 03/25/2013 1347   BILITOT 0.8 07/14/2021 2254   BILITOT 0.5 03/25/2013 1347   GFRNONAA >60 04/01/2022 0737   GFRNONAA >60 04/06/2013 0652     No results found.     Assessment & Plan:   1. Hemiparesis affecting left side as late effect of cerebrovascular accident (CVA) (HCC) This also likely affects the discoloration in her left lower extremity  2. PVD (peripheral vascular disease) (HCC) Noninvasive studies today show no significant evidence of arterial disease or occlusion.  She does have some microvascular disease which is typically not amenable to surgical intervention.  In addition, the patient does have a decreased EF which also may be driving her discoloration.  She is currently medically optimized.  She does have some lower extremity edema which would be treated with medical grade compression.  She should use 20 to 30 mmHg daily in addition to elevating her lower extremities.  Will have patient return in 6 months for reevaluation.  3. Essential hypertension, benign Continue antihypertensive medications as already ordered, these medications have been reviewed and there are no changes at this time.   Current Outpatient Medications on File Prior to Visit  Medication Sig Dispense Refill   acetaminophen (TYLENOL) 500 MG tablet Take 1,000 mg by mouth every 6 (six) hours as needed (osteoarthritis of left shoulder).     alum & mag hydroxide-simeth (MAALOX PLUS) 400-400-40 MG/5ML suspension Take 30 mLs by mouth every 4 (four) hours as needed for indigestion.      ARNUITY ELLIPTA 100 MCG/ACT AEPB Inhale 1 puff into the lungs daily.     bisacodyl (DULCOLAX) 5 MG EC tablet Take 5 mg by mouth every other  day.     carvedilol (COREG) 3.125 MG tablet Take 3.125 mg by mouth 2 (two) times daily.      Cholecalciferol (VITAMIN D3) 125 MCG (5000 UT) CAPS Take 5,000 Units by mouth daily.     demeclocycline (DECLOMYCIN) 300 MG tablet Take 300 mg by mouth 2 (two) times daily.      divalproex (DEPAKOTE) 125 MG DR tablet Take 125 mg by mouth 2 (two) times daily.     docusate sodium (DOK) 100 MG capsule Take 100 mg by mouth daily.     EUCERIN ITCH RELIEF 0.1 % LOTN Apply topically 2 (two) times daily. To dry area on left breast     fluconazole (DIFLUCAN) 150 MG tablet SMARTSIG:1.0 Tablet(s) By Mouth Once     furosemide (LASIX) 40 MG tablet Take 40 mg by mouth daily.     gabapentin (NEURONTIN) 600 MG tablet SMARTSIG:1.0 Tablet(s) By Mouth 3 Times Daily     hydrocortisone cream 1 % Apply 1 Application topically 2 (two) times daily. hemorrhoids     ipratropium-albuterol (DUONEB) 0.5-2.5 (3) MG/3ML SOLN Take 3 mLs by nebulization every 6 (six) hours as needed.      levothyroxine (SYNTHROID, LEVOTHROID) 75 MCG tablet Take 75 mcg by mouth daily before breakfast.      loratadine (CLARITIN) 10 MG tablet Take 10 mg by mouth daily.     montelukast (SINGULAIR) 10 MG tablet Take 10 mg by mouth at bedtime.      NYSTATIN powder Apply topically 2 (two) times  daily.     omeprazole (PRILOSEC) 20 MG capsule Take 20 mg by mouth in the morning.     oxyCODONE-acetaminophen (PERCOCET/ROXICET) 5-325 MG tablet Take 0.5 tablets by mouth every 6 (six) hours as needed for moderate pain. 6 tablet 0   OXYGEN O2 as needed.  Titrate to keep sat > / = 92     polyethylene glycol (MIRALAX / GLYCOLAX) packet Take 17 g by mouth daily. dx: constipation  Hold if loose stool     Probiotic Product (ACIDOPHILUS) CHEW SMARTSIG:1.0 By Mouth Daily     sodium chloride 1 g tablet Take 1 tablet (1 g total) by mouth 3 (three) times daily with meals.     spironolactone (ALDACTONE) 25 MG tablet SMARTSIG:1.0 Tablet(s) By Mouth Daily     SYSTANE BALANCE 0.6  % SOLN Place 1 drop into both eyes 2 (two) times daily.     torsemide (DEMADEX) 10 MG tablet Take 10 mg by mouth daily.      triamcinolone (NASACORT) 55 MCG/ACT AERO nasal inhaler Place 1 spray into the nose daily.     venlafaxine XR (EFFEXOR-XR) 37.5 MG 24 hr capsule Take 37.5 mg by mouth daily.     zinc oxide 20 % ointment Apply topically.     dipyridamole-aspirin (AGGRENOX) 200-25 MG 12hr capsule Take 1 capsule by mouth every 12 (twelve) hours.  (Patient not taking: Reported on 12/12/2022)     guaiFENesin (MUCINEX) 600 MG 12 hr tablet Take 2 tablets (1,200 mg total) by mouth 2 (two) times daily as needed. Home med. (Patient not taking: Reported on 12/12/2022)     prazosin (MINIPRESS) 1 MG capsule Take 3 mg by mouth at bedtime. 3 capsules (Patient not taking: Reported on 12/12/2022)     sennosides-docusate sodium (SENOKOT-S) 8.6-50 MG tablet Take 2 tablets by mouth 2 (two) times daily as needed for constipation. Also take 1 tab by mouth at bedtime daily.     Skin Protectants, Misc. (ENDIT EX) Apply liberal amount topically to areas of skin irritation as needed. Ok to leave at bedside.     No current facility-administered medications on file prior to visit.    There are no Patient Instructions on file for this visit. No follow-ups on file.   Georgiana Spinner, NP

## 2022-12-25 ENCOUNTER — Ambulatory Visit
Admission: RE | Admit: 2022-12-25 | Discharge: 2022-12-25 | Disposition: A | Discharge status: Home / Self Care | Payer: Medicare Other | Source: Ambulatory Visit | Attending: Internal Medicine | Admitting: Internal Medicine

## 2022-12-25 DIAGNOSIS — R0600 Dyspnea, unspecified: Secondary | ICD-10-CM | POA: Diagnosis not present

## 2022-12-25 DIAGNOSIS — R0602 Shortness of breath: Secondary | ICD-10-CM | POA: Insufficient documentation

## 2022-12-25 DIAGNOSIS — I517 Cardiomegaly: Secondary | ICD-10-CM | POA: Diagnosis not present

## 2022-12-25 LAB — ECHOCARDIOGRAM COMPLETE
AR max vel: 2.16 cm2
AV Area VTI: 2.23 cm2
AV Area mean vel: 2.04 cm2
AV Mean grad: 3 mm[Hg]
AV Peak grad: 4.8 mm[Hg]
Ao pk vel: 1.09 m/s
Area-P 1/2: 4.83 cm2
S' Lateral: 2.2 cm

## 2022-12-25 NOTE — Progress Notes (Signed)
*  PRELIMINARY RESULTS* Echocardiogram 2D Echocardiogram has been performed.  [name] 12/25/2022, 10:54 AM

## 2023-05-16 ENCOUNTER — Telehealth (INDEPENDENT_AMBULATORY_CARE_PROVIDER_SITE_OTHER): Payer: Self-pay | Admitting: Nurse Practitioner

## 2023-05-16 NOTE — Telephone Encounter (Signed)
 [name], she stated she faxed them over on the 7th. I've checked your desk and I didn't see them. I asked her to fax them again. I am currently waiting for them.

## 2023-05-16 NOTE — Telephone Encounter (Signed)
 Where did she have these studies done? I don't see any.

## 2023-05-16 NOTE — Telephone Encounter (Signed)
 Per Jullie Oiler from [facility] [phone]) pt had ABI arterial doppler that they want FB to look at an advise what we should do for patient. Jullie Oiler would like call back RE: results

## 2023-05-19 NOTE — Telephone Encounter (Addendum)
 [name]

## 2023-05-19 NOTE — Telephone Encounter (Signed)
 I saw the studies, Rebekah sent me a message about them.  She has a follow up in a few weeks, if she's not having major issues, just keep that appt

## 2023-05-28 ENCOUNTER — Telehealth (INDEPENDENT_AMBULATORY_CARE_PROVIDER_SITE_OTHER): Payer: Self-pay

## 2023-05-28 NOTE — Telephone Encounter (Signed)
 Please review previous phone discussion which notes that this testing was received and we reached out to the facility to let them know we will discuss this at her upcoming visit.

## 2023-05-28 NOTE — Telephone Encounter (Signed)
 Caller left a message with the nurse on call stating that she has the patient lab results AVI testing. She is claiming to have called the office for a long while , but hasn't gotten a response. She is needed to know if it was received.   According to her chart there hasn't been any calls from this company   Please advise

## 2023-05-29 NOTE — Telephone Encounter (Signed)
 Found it, sorry for the confusion.

## 2023-06-12 ENCOUNTER — Ambulatory Visit (INDEPENDENT_AMBULATORY_CARE_PROVIDER_SITE_OTHER): Payer: PPO | Admitting: Nurse Practitioner
